# Patient Record
Sex: Female | Born: 1935 | Race: White | Hispanic: No | Smoking: Never smoker
Health system: Southern US, Community
[De-identification: ages and names within clinical notes are randomized; demographics above are authoritative.]

## PROBLEM LIST (undated history)

## (undated) DIAGNOSIS — Z8719 Personal history of other diseases of the digestive system: Secondary | ICD-10-CM

## (undated) DIAGNOSIS — Z5189 Encounter for other specified aftercare: Secondary | ICD-10-CM

## (undated) DIAGNOSIS — Z86010 Personal history of colonic polyps: Secondary | ICD-10-CM

## (undated) DIAGNOSIS — Z860101 Personal history of adenomatous and serrated colon polyps: Secondary | ICD-10-CM

## (undated) DIAGNOSIS — N3946 Mixed incontinence: Secondary | ICD-10-CM

## (undated) DIAGNOSIS — M1990 Unspecified osteoarthritis, unspecified site: Secondary | ICD-10-CM

## (undated) DIAGNOSIS — F32A Depression, unspecified: Secondary | ICD-10-CM

## (undated) DIAGNOSIS — M8580 Other specified disorders of bone density and structure, unspecified site: Secondary | ICD-10-CM

## (undated) DIAGNOSIS — K219 Gastro-esophageal reflux disease without esophagitis: Secondary | ICD-10-CM

## (undated) DIAGNOSIS — I350 Nonrheumatic aortic (valve) stenosis: Secondary | ICD-10-CM

## (undated) DIAGNOSIS — N3281 Overactive bladder: Secondary | ICD-10-CM

## (undated) DIAGNOSIS — Z9889 Other specified postprocedural states: Secondary | ICD-10-CM

## (undated) DIAGNOSIS — F329 Major depressive disorder, single episode, unspecified: Secondary | ICD-10-CM

## (undated) DIAGNOSIS — C801 Malignant (primary) neoplasm, unspecified: Secondary | ICD-10-CM

## (undated) DIAGNOSIS — I10 Essential (primary) hypertension: Secondary | ICD-10-CM

## (undated) HISTORY — DX: Essential (primary) hypertension: I10

## (undated) HISTORY — PX: CATARACT EXTRACTION, BILATERAL: SHX1313

## (undated) HISTORY — DX: Nonrheumatic aortic (valve) stenosis: I35.0

## (undated) HISTORY — DX: Unspecified osteoarthritis, unspecified site: M19.90

## (undated) HISTORY — PX: ROTATOR CUFF REPAIR: SHX139

## (undated) HISTORY — DX: Other specified disorders of bone density and structure, unspecified site: M85.80

## (undated) HISTORY — DX: Other specified postprocedural states: Z98.890

## (undated) HISTORY — DX: Malignant (primary) neoplasm, unspecified: C80.1

## (undated) HISTORY — DX: Gastro-esophageal reflux disease without esophagitis: K21.9

## (undated) HISTORY — DX: Encounter for other specified aftercare: Z51.89

## (undated) HISTORY — DX: Depression, unspecified: F32.A

## (undated) HISTORY — DX: Overactive bladder: N32.81

## (undated) HISTORY — DX: Mixed incontinence: N39.46

## (undated) HISTORY — DX: Personal history of colonic polyps: Z86.010

## (undated) HISTORY — PX: CHOLECYSTECTOMY: SHX55

## (undated) HISTORY — DX: Personal history of adenomatous and serrated colon polyps: Z86.0101

## (undated) HISTORY — DX: Major depressive disorder, single episode, unspecified: F32.9

## (undated) HISTORY — DX: Personal history of other diseases of the digestive system: Z87.19

---

## 1969-09-14 DIAGNOSIS — Z5189 Encounter for other specified aftercare: Secondary | ICD-10-CM

## 1969-09-14 HISTORY — PX: APPENDECTOMY: SHX54

## 1969-09-14 HISTORY — PX: ABDOMINAL HYSTERECTOMY: SHX81

## 1969-09-14 HISTORY — PX: OOPHORECTOMY: SHX86

## 1969-09-14 HISTORY — DX: Encounter for other specified aftercare: Z51.89

## 1993-09-14 HISTORY — PX: TOE SURGERY: SHX1073

## 1998-01-17 ENCOUNTER — Other Ambulatory Visit: Admission: RE | Admit: 1998-01-17 | Discharge: 1998-01-17 | Payer: Self-pay | Admitting: Obstetrics and Gynecology

## 1999-01-13 ENCOUNTER — Other Ambulatory Visit: Admission: RE | Admit: 1999-01-13 | Discharge: 1999-01-13 | Payer: Self-pay | Admitting: Obstetrics and Gynecology

## 2000-01-30 ENCOUNTER — Ambulatory Visit (HOSPITAL_COMMUNITY): Admission: RE | Admit: 2000-01-30 | Discharge: 2000-01-30 | Payer: Self-pay | Admitting: Obstetrics and Gynecology

## 2000-01-30 ENCOUNTER — Encounter: Payer: Self-pay | Admitting: Obstetrics and Gynecology

## 2000-03-25 ENCOUNTER — Encounter: Payer: Self-pay | Admitting: Obstetrics and Gynecology

## 2000-03-30 ENCOUNTER — Inpatient Hospital Stay (HOSPITAL_COMMUNITY): Admission: RE | Admit: 2000-03-30 | Discharge: 2000-03-31 | Payer: Self-pay | Admitting: Obstetrics and Gynecology

## 2001-03-22 ENCOUNTER — Other Ambulatory Visit: Admission: RE | Admit: 2001-03-22 | Discharge: 2001-03-22 | Payer: Self-pay | Admitting: Gastroenterology

## 2001-03-22 ENCOUNTER — Encounter (INDEPENDENT_AMBULATORY_CARE_PROVIDER_SITE_OTHER): Payer: Self-pay | Admitting: Specialist

## 2001-09-14 HISTORY — PX: BLADDER SURGERY: SHX569

## 2003-01-09 ENCOUNTER — Encounter: Admission: RE | Admit: 2003-01-09 | Discharge: 2003-01-09 | Payer: Self-pay | Admitting: Internal Medicine

## 2003-01-09 ENCOUNTER — Encounter: Payer: Self-pay | Admitting: Internal Medicine

## 2003-03-16 ENCOUNTER — Encounter (HOSPITAL_BASED_OUTPATIENT_CLINIC_OR_DEPARTMENT_OTHER): Payer: Self-pay | Admitting: General Surgery

## 2003-03-16 ENCOUNTER — Encounter: Admission: RE | Admit: 2003-03-16 | Discharge: 2003-03-16 | Payer: Self-pay | Admitting: General Surgery

## 2003-03-16 ENCOUNTER — Encounter (INDEPENDENT_AMBULATORY_CARE_PROVIDER_SITE_OTHER): Payer: Self-pay | Admitting: *Deleted

## 2003-09-25 ENCOUNTER — Encounter: Admission: RE | Admit: 2003-09-25 | Discharge: 2003-09-25 | Payer: Self-pay | Admitting: Internal Medicine

## 2004-07-23 ENCOUNTER — Ambulatory Visit: Payer: Self-pay | Admitting: Internal Medicine

## 2004-08-20 ENCOUNTER — Ambulatory Visit: Payer: Self-pay | Admitting: Internal Medicine

## 2004-09-14 DIAGNOSIS — Z9889 Other specified postprocedural states: Secondary | ICD-10-CM

## 2004-09-14 HISTORY — DX: Other specified postprocedural states: Z98.890

## 2005-01-26 ENCOUNTER — Ambulatory Visit: Payer: Self-pay | Admitting: Internal Medicine

## 2005-04-02 ENCOUNTER — Ambulatory Visit: Payer: Self-pay | Admitting: Cardiovascular Disease

## 2005-04-02 ENCOUNTER — Encounter: Payer: Self-pay | Admitting: Internal Medicine

## 2005-04-02 ENCOUNTER — Ambulatory Visit: Payer: Self-pay | Admitting: Family Medicine

## 2005-04-02 ENCOUNTER — Inpatient Hospital Stay (HOSPITAL_COMMUNITY): Admission: AD | Admit: 2005-04-02 | Discharge: 2005-04-04 | Payer: Self-pay | Admitting: Family Medicine

## 2005-04-03 ENCOUNTER — Encounter: Payer: Self-pay | Admitting: Cardiovascular Disease

## 2005-04-09 ENCOUNTER — Ambulatory Visit: Payer: Self-pay | Admitting: Internal Medicine

## 2005-04-10 ENCOUNTER — Ambulatory Visit (HOSPITAL_COMMUNITY): Admission: RE | Admit: 2005-04-10 | Discharge: 2005-04-10 | Payer: Self-pay | Admitting: Internal Medicine

## 2005-04-10 ENCOUNTER — Ambulatory Visit: Payer: Self-pay

## 2005-04-24 ENCOUNTER — Ambulatory Visit: Payer: Self-pay | Admitting: Cardiology

## 2005-06-25 ENCOUNTER — Ambulatory Visit: Payer: Self-pay | Admitting: Family Medicine

## 2005-09-18 ENCOUNTER — Ambulatory Visit: Payer: Self-pay | Admitting: Internal Medicine

## 2006-02-04 ENCOUNTER — Ambulatory Visit: Payer: Self-pay | Admitting: Internal Medicine

## 2006-05-28 ENCOUNTER — Ambulatory Visit: Payer: Self-pay | Admitting: Internal Medicine

## 2006-07-21 ENCOUNTER — Ambulatory Visit: Payer: Self-pay | Admitting: Internal Medicine

## 2007-03-10 ENCOUNTER — Encounter (INDEPENDENT_AMBULATORY_CARE_PROVIDER_SITE_OTHER): Payer: Self-pay | Admitting: *Deleted

## 2007-06-09 ENCOUNTER — Telehealth (INDEPENDENT_AMBULATORY_CARE_PROVIDER_SITE_OTHER): Payer: Self-pay | Admitting: *Deleted

## 2007-06-24 ENCOUNTER — Ambulatory Visit: Payer: Self-pay | Admitting: Internal Medicine

## 2007-06-24 DIAGNOSIS — M1990 Unspecified osteoarthritis, unspecified site: Secondary | ICD-10-CM | POA: Insufficient documentation

## 2007-06-24 DIAGNOSIS — D126 Benign neoplasm of colon, unspecified: Secondary | ICD-10-CM

## 2007-06-24 DIAGNOSIS — F329 Major depressive disorder, single episode, unspecified: Secondary | ICD-10-CM

## 2007-06-24 DIAGNOSIS — M8580 Other specified disorders of bone density and structure, unspecified site: Secondary | ICD-10-CM

## 2007-06-24 DIAGNOSIS — I10 Essential (primary) hypertension: Secondary | ICD-10-CM | POA: Insufficient documentation

## 2007-06-27 ENCOUNTER — Ambulatory Visit: Payer: Self-pay | Admitting: Internal Medicine

## 2007-06-28 LAB — CONVERTED CEMR LAB: Iron: 78 ug/dL (ref 42–145)

## 2007-07-11 ENCOUNTER — Encounter: Payer: Self-pay | Admitting: Internal Medicine

## 2007-07-11 ENCOUNTER — Ambulatory Visit: Payer: Self-pay

## 2007-07-15 ENCOUNTER — Ambulatory Visit: Payer: Self-pay | Admitting: Internal Medicine

## 2007-07-15 ENCOUNTER — Encounter: Payer: Self-pay | Admitting: Internal Medicine

## 2007-08-18 ENCOUNTER — Ambulatory Visit: Payer: Self-pay | Admitting: Internal Medicine

## 2007-08-19 ENCOUNTER — Encounter (INDEPENDENT_AMBULATORY_CARE_PROVIDER_SITE_OTHER): Payer: Self-pay | Admitting: *Deleted

## 2007-09-06 ENCOUNTER — Encounter (INDEPENDENT_AMBULATORY_CARE_PROVIDER_SITE_OTHER): Payer: Self-pay | Admitting: *Deleted

## 2007-09-06 ENCOUNTER — Telehealth (INDEPENDENT_AMBULATORY_CARE_PROVIDER_SITE_OTHER): Payer: Self-pay | Admitting: *Deleted

## 2007-09-06 ENCOUNTER — Encounter: Payer: Self-pay | Admitting: Internal Medicine

## 2007-10-04 ENCOUNTER — Telehealth: Payer: Self-pay | Admitting: Internal Medicine

## 2007-11-15 ENCOUNTER — Ambulatory Visit (HOSPITAL_COMMUNITY): Admission: RE | Admit: 2007-11-15 | Discharge: 2007-11-16 | Payer: Self-pay | Admitting: Obstetrics and Gynecology

## 2007-11-28 ENCOUNTER — Ambulatory Visit: Payer: Self-pay | Admitting: Internal Medicine

## 2007-11-28 DIAGNOSIS — K219 Gastro-esophageal reflux disease without esophagitis: Secondary | ICD-10-CM

## 2008-04-10 ENCOUNTER — Encounter: Payer: Self-pay | Admitting: Internal Medicine

## 2008-06-15 ENCOUNTER — Ambulatory Visit: Payer: Self-pay | Admitting: Internal Medicine

## 2008-06-19 LAB — CONVERTED CEMR LAB
BUN: 24 mg/dL — ABNORMAL HIGH (ref 6–23)
Basophils Relative: 0.6 % (ref 0.0–3.0)
Chloride: 104 meq/L (ref 96–112)
Creatinine, Ser: 0.8 mg/dL (ref 0.4–1.2)
Eosinophils Absolute: 0.2 10*3/uL (ref 0.0–0.7)
Eosinophils Relative: 3.7 % (ref 0.0–5.0)
GFR calc non Af Amer: 75 mL/min
Glucose, Bld: 90 mg/dL (ref 70–99)
HCT: 36.8 % (ref 36.0–46.0)
HDL: 50.8 mg/dL (ref >39.0)
MCV: 90.5 fL (ref 78.0–100.0)
Monocytes Relative: 10.5 % (ref 3.0–12.0)
Neutrophils Relative %: 55.8 % (ref 43.0–77.0)
Potassium: 3.7 meq/L (ref 3.5–5.1)
RBC: 4.07 M/uL (ref 3.87–5.11)
Total CHOL/HDL Ratio: 4.7
WBC: 5.1 10*3/uL (ref 4.5–10.5)

## 2008-10-02 ENCOUNTER — Telehealth (INDEPENDENT_AMBULATORY_CARE_PROVIDER_SITE_OTHER): Payer: Self-pay | Admitting: *Deleted

## 2008-10-04 ENCOUNTER — Telehealth (INDEPENDENT_AMBULATORY_CARE_PROVIDER_SITE_OTHER): Payer: Self-pay | Admitting: *Deleted

## 2008-11-21 ENCOUNTER — Ambulatory Visit: Payer: Self-pay | Admitting: Internal Medicine

## 2008-11-21 DIAGNOSIS — E785 Hyperlipidemia, unspecified: Secondary | ICD-10-CM

## 2008-11-22 ENCOUNTER — Telehealth: Payer: Self-pay | Admitting: Internal Medicine

## 2008-11-26 ENCOUNTER — Telehealth: Payer: Self-pay | Admitting: Internal Medicine

## 2008-11-26 ENCOUNTER — Ambulatory Visit: Payer: Self-pay | Admitting: Internal Medicine

## 2008-11-29 LAB — CONVERTED CEMR LAB
AST: 20 units/L (ref 0–37)
Cholesterol: 201 mg/dL (ref 0–200)
HDL: 46.9 mg/dL (ref >39.0)
TSH: 3.3 microintl units/mL (ref 0.35–5.50)
Total CHOL/HDL Ratio: 4.3
Triglycerides: 81 mg/dL (ref 0–149)
VLDL: 16 mg/dL (ref 0–40)

## 2009-01-07 ENCOUNTER — Ambulatory Visit: Payer: Self-pay | Admitting: Internal Medicine

## 2009-01-21 ENCOUNTER — Encounter: Payer: Self-pay | Admitting: Internal Medicine

## 2009-01-21 ENCOUNTER — Ambulatory Visit: Payer: Self-pay | Admitting: Internal Medicine

## 2009-01-24 ENCOUNTER — Encounter: Payer: Self-pay | Admitting: Internal Medicine

## 2009-03-19 ENCOUNTER — Encounter: Payer: Self-pay | Admitting: Internal Medicine

## 2009-03-20 ENCOUNTER — Telehealth (INDEPENDENT_AMBULATORY_CARE_PROVIDER_SITE_OTHER): Payer: Self-pay | Admitting: *Deleted

## 2009-06-12 ENCOUNTER — Ambulatory Visit: Payer: Self-pay | Admitting: Internal Medicine

## 2009-06-27 ENCOUNTER — Ambulatory Visit: Payer: Self-pay | Admitting: Internal Medicine

## 2009-12-13 ENCOUNTER — Ambulatory Visit: Payer: Self-pay | Admitting: Internal Medicine

## 2009-12-13 DIAGNOSIS — R079 Chest pain, unspecified: Secondary | ICD-10-CM

## 2009-12-20 ENCOUNTER — Ambulatory Visit: Payer: Self-pay | Admitting: Internal Medicine

## 2010-01-01 ENCOUNTER — Ambulatory Visit: Payer: Self-pay | Admitting: Cardiology

## 2010-01-01 DIAGNOSIS — I350 Nonrheumatic aortic (valve) stenosis: Secondary | ICD-10-CM

## 2010-01-20 ENCOUNTER — Ambulatory Visit: Payer: Self-pay

## 2010-01-20 ENCOUNTER — Encounter: Payer: Self-pay | Admitting: Cardiology

## 2010-01-20 ENCOUNTER — Ambulatory Visit (HOSPITAL_COMMUNITY): Admission: RE | Admit: 2010-01-20 | Discharge: 2010-01-20 | Payer: Self-pay | Admitting: Cardiology

## 2010-01-20 ENCOUNTER — Ambulatory Visit: Payer: Self-pay | Admitting: Internal Medicine

## 2010-02-03 ENCOUNTER — Ambulatory Visit: Payer: Self-pay | Admitting: Internal Medicine

## 2010-02-04 LAB — CONVERTED CEMR LAB
ALT: 23 units/L (ref 0–35)
AST: 21 units/L (ref 0–37)
LDL Cholesterol: 86 mg/dL (ref 0–99)
Total CHOL/HDL Ratio: 3

## 2010-02-19 ENCOUNTER — Encounter: Payer: Self-pay | Admitting: Internal Medicine

## 2010-04-15 ENCOUNTER — Ambulatory Visit: Payer: Self-pay | Admitting: Internal Medicine

## 2010-04-15 ENCOUNTER — Ambulatory Visit: Payer: Self-pay

## 2010-04-15 DIAGNOSIS — T6391XA Toxic effect of contact with unspecified venomous animal, accidental (unintentional), initial encounter: Secondary | ICD-10-CM | POA: Insufficient documentation

## 2010-04-15 DIAGNOSIS — R609 Edema, unspecified: Secondary | ICD-10-CM

## 2010-05-12 ENCOUNTER — Ambulatory Visit: Payer: Self-pay | Admitting: Family Medicine

## 2010-05-12 ENCOUNTER — Encounter: Payer: Self-pay | Admitting: Internal Medicine

## 2010-06-12 ENCOUNTER — Ambulatory Visit: Payer: Self-pay | Admitting: Internal Medicine

## 2010-08-14 ENCOUNTER — Ambulatory Visit: Payer: Self-pay | Admitting: Internal Medicine

## 2010-10-06 ENCOUNTER — Encounter: Payer: Self-pay | Admitting: Obstetrics and Gynecology

## 2010-10-12 LAB — CONVERTED CEMR LAB
AST: 20 units/L (ref 0–37)
Alkaline Phosphatase: 75 units/L (ref 39–117)
BUN: 17 mg/dL (ref 6–23)
BUN: 18 mg/dL (ref 6–23)
Basophils Absolute: 0 10*3/uL (ref 0.0–0.1)
Bilirubin, Direct: 0.1 mg/dL (ref 0.0–0.3)
CO2: 26 meq/L (ref 19–32)
Chloride: 106 meq/L (ref 96–112)
Cholesterol: 198 mg/dL (ref 0–200)
Cholesterol: 231 mg/dL — ABNORMAL HIGH (ref 0–200)
Creatinine, Ser: 0.8 mg/dL (ref 0.4–1.2)
Eosinophils Absolute: 0.2 10*3/uL (ref 0.0–0.7)
Eosinophils Relative: 3.5 % (ref 0.0–5.0)
GFR calc Af Amer: 107 mL/min
Glucose, Bld: 99 mg/dL (ref 70–99)
HCT: 37.7 % (ref 36.0–46.0)
HDL: 61 mg/dL (ref >39.0)
Hemoglobin: 11.5 g/dL — ABNORMAL LOW (ref 12.0–15.0)
LDL Cholesterol: 121 mg/dL — ABNORMAL HIGH (ref 0–99)
Lymphocytes Relative: 36.9 % (ref 12.0–46.0)
Lymphs Abs: 1.5 10*3/uL (ref 0.7–4.0)
MCHC: 33.8 g/dL (ref 30.0–36.0)
MCHC: 34.9 g/dL (ref 30.0–36.0)
MCV: 90.3 fL (ref 78.0–100.0)
MCV: 90.7 fL (ref 78.0–100.0)
Monocytes Absolute: 0.4 10*3/uL (ref 0.1–1.0)
Monocytes Absolute: 0.4 10*3/uL (ref 0.2–0.7)
Monocytes Relative: 11.9 % — ABNORMAL HIGH (ref 3.0–11.0)
Neutro Abs: 1.6 10*3/uL (ref 1.4–7.7)
Neutrophils Relative %: 45.5 % (ref 43.0–77.0)
Neutrophils Relative %: 54.4 % (ref 43.0–77.0)
Platelets: 250 10*3/uL (ref 150.0–400.0)
Potassium: 3.9 meq/L (ref 3.5–5.1)
Potassium: 3.9 meq/L (ref 3.5–5.1)
Sodium: 143 meq/L (ref 135–145)
TSH: 2.79 microintl units/mL (ref 0.35–5.50)
TSH: 3.04 microintl units/mL (ref 0.35–5.50)
Total CHOL/HDL Ratio: 5
Total Protein: 7 g/dL (ref 6.0–8.3)
Triglycerides: 202 mg/dL — ABNORMAL HIGH (ref 0.0–149.0)
WBC: 4.6 10*3/uL (ref 4.5–10.5)

## 2010-10-14 NOTE — Assessment & Plan Note (Signed)
Summary: [name]/ chestpain./ pt has secure horizone/ gd  Medications Added VITAMIN D 1000 UNIT  TABS (CHOLECALCIFEROL) Take 1 tablet by mouth once a day VITAMIN E 600 UNIT  CAPS (VITAMIN E) Take 1 capsule by mouth once a day ASPIRIN 81 MG TBEC (ASPIRIN) Take one tablet by mouth daily FISH OIL   OIL (FISH OIL) 2 by mouth daily VITAMIN B COMPLEX-C   CAPS (B COMPLEX-C) 1 by mouth daily * OCUVITE 1 by mouth daily CINNAMON 500 MG CAPS (CINNAMON) 1 by mouth daily      Allergies Added: NKDA  Visit Type:  Follow-up Primary Provider:  Nolon Rod. Paz MD  CC:  HCM.  History of Present Illness: The patient is referred for evaluation of chest pain. I have reviewed old records and see that she also has a history of mild septal hypertrophy with some evidence of dynamic outflow obstruction found on echo in 2008. She has had negative stress perfusion studies though I don't have these available. I did review the catheterization 2006 which demonstrated normal coronaries. She does report stress testing since that time. With a chest discomfort. She does have a history of left bundle branch block dating back at least 2006.  She reports chest discomfort which have been on the last day of March. She actually had some right arm discomfort upper right chest discomfort. It did not cross the sternum. Slight choking sensation and lasts several minutes. However, it went away spontaneously. She had one very mild episodes of this since then. She's not sure whether she's had this before. It came on at rest. There were no other associated symptoms. She is somewhat goes to the gym couple of times per week and she'll do any aerobic activities and a to the 150s. She has done this recently and has not brought on in his discomfort. She doesn't describe an he has no palpitations, presyncope or syncope    Current Medications (verified): 1)  Omeprazole 20 Mg  Cpdr (Omeprazole) .... Twice A Day, Always On Empty Stomach 2)  Procardia Xl  60 Mg  Tb24 (Nifedipine) .Marland Kitchen.. 1 By Mouth Once Daily 3)  Venlafaxine Hcl 25 Mg Tabs (Venlafaxine Hcl) .Marland Kitchen.. 1 By Mouth Two Times A Day 4)  Vitamin D 1000 Unit  Tabs (Cholecalciferol) .... Take 1 Tablet By Mouth Once A Day 5)  Vitamin E 600 Unit  Caps (Vitamin E) .... Take 1 Capsule By Mouth Once A Day 6)  Aspirin 81 Mg Tbec (Aspirin) .... Take One Tablet By Mouth Daily 7)  Simvastatin 20 Mg Tabs (Simvastatin) .Marland Kitchen.. 1 By Mouth At Bedtime - Due Lab Work in 6 Weeks 8)  Fish Gaffer (Engineer, petroleum) .... 2 By Mouth Daily 9)  Vitamin B Complex-C   Caps (B Complex-C) .Marland Kitchen.. 1 By Mouth Daily 10)  Ocuvite .Marland Kitchen.. 1 By Mouth Daily 11)  Cinnamon 500 Mg Caps (Cinnamon) .Marland Kitchen.. 1 By Mouth Daily  Allergies (verified): No Known Drug Allergies  Past History:  Past Medical History: HYPERTENSION   DEPRESSION  OSTEOPENIA   COLONIC POLYPS, ADENOMATOUS   OSTEOARTHRITIS   GERD Cardiac cath 2006, neg  Hyperlipidemia (mild)  Past Surgical History: Cholecystectomy Hysterectomy Oophorectomy Appendectomy (at time of hyst?) Bladder surgery Rotator cuff repair (R)  Family History: Reviewed history from 12/13/2009 and no changes required. colon ca--no breast ca--no MI--no CAD-- F had CABG at 75y/o GB cancer -- F stroke--M her identical twin dx ovarian Ca, patient saw her gyn, they did  a test  (?  CA 125) and it came back ok -- DECEASED 2009-08-25  Social History: Reviewed history from 12/13/2009 and no changes required. Married 2 kids tobacco--no ETOH-- rarely has wine  diet--  ok most of the time  exercise -- not as much as she likes   Review of Systems       Positive for reflux, joint pains. Otherwise as stated in the history of present illness negative for all other systems.  Physical Exam  General:  Well developed, well nourished, in no acute distress. Head:  normocephalic and atraumatic Eyes:  PERRLA/EOM intact; conjunctiva and lids normal. Mouth:  Teeth, gums and palate normal. Oral mucosa  normal. Neck:  Neck supple, no JVD. No masses, thyromegaly or abnormal cervical nodes. Chest Wall:  no deformities or breast masses noted Lungs:  Clear bilaterally to auscultation and percussion. Abdomen:  Bowel sounds positive; abdomen soft and non-tender without masses, organomegaly, or hernias noted. No hepatosplenomegaly. Msk:  Back normal, normal gait. Muscle strength and tone normal. Extremities:  No clubbing or cyanosis. Neurologic:  Alert and oriented x 3. Skin:  Intact without lesions or rashes. Cervical Nodes:  no significant adenopathy Axillary Nodes:  no significant adenopathy Inguinal Nodes:  no significant adenopathy Psych:  Normal affect.   Detailed Cardiovascular Exam  Neck    Carotids: Carotids full and equal bilaterally without bruits.      Neck Veins: Normal, no JVD.    Heart    Inspection: no deformities or lifts noted.      Palpation: normal PMI with no thrills palpable.      Auscultation: systolic murmur radiating slightly aortic outflow tract, this increases slightly with the strain phase of Valsalva  Vascular    Abdominal Aorta: no palpable masses, pulsations, or audible bruits.      Femoral Pulses: normal femoral pulses bilaterally.      Pedal Pulses: normal pedal pulses bilaterally.      Radial Pulses: normal radial pulses bilaterally.      Peripheral Circulation: no clubbing, cyanosis, or edema noted with normal capillary refill.     EKG  Procedure date:  12/13/2009  Findings:      Normal sinus rhythm, left bundle branch block, left axis deviation  Impression & Recommendations:  Problem # 1:  CHEST PAIN (ICD-786.50) The patient's chest pain was quite atypical. At this point I think the pretest probability of obstructive coronary disease is very low. I would not suggest further cardiovascular testing with imaging is as recurrent symptoms. We discussed this at length  Problem # 2:  HOCM (ICD-425.1) 3 years ago the patient had some hypertrophic  cardiomyopathy with septal. It was mild though there was some evidence of mitral valve involvement. She does have a dynamic murmur. I think it is reasonable to followup with an echocardiogram. Orders: Echocardiogram (Echo)  Problem # 3:  HYPERTENSION (ICD-401.9) Her blood pressure is slightly elevated. She reports that she has "white coat hypertension. I will make no change to her regimen  Patient Instructions: 1)  Your physician recommends that you schedule a follow-up appointment as directed 2)  Your physician recommends that you continue on your current medications as directed. Please refer to the Current Medication list given to you today. 3)  Your physician has requested that you have an echocardiogram.  Echocardiography is a painless test that uses sound waves to create images of your heart. It provides your doctor with information about the size and shape of your heart and how well your heart's chambers and  valves are working.  This procedure takes approximately one hour. There are no restrictions for this procedure.

## 2010-10-14 NOTE — Assessment & Plan Note (Signed)
Summary: yearly---/alr resch from 12/03/09   Vital Signs:  Patient profile:   75 year old female Height:      67 inches Weight:      205.2 pounds BMI:     32.26 Pulse rate:   82 / minute BP sitting:   160 / 80  Vitals Entered By: Shary Decamp (December 13, 2009 9:51 AM) CC: yearly - fasting Is Patient Diabetic? No Comments  - c/o of rt arm pain, radiated to chest yesterday when pt was folding clothes.  Took ASA - sxs resolved  - pt identical twin passed away 08/21/09 [facility]  December 13, 2009 9:52 AM    History of Present Illness:  yearly - fasting, chart reviewed   c/o of rt shoulder  pain, radiated to the upper R chest yesterday and felt "tight" in the throat sx happened when pt was folding clothes.  Took ASA - sxs resolved lasted x minutes  no assoc. nausea, diaphoresis, GERD symptoms (although recognizes she has heartburn lately) she was about to go to the ER but symptoms subsided    HYPERTENSION  -- no ambulatory BPs  DEPRESSION --lost her twin sister few months ago, they were very close, still quite depress  OSTEOPENIA  -- on OTC vit D OSTEOARTHRITIS  -- on tylenol, symptoms relatively well controlled     Allergies: No Known Drug Allergies  Past History:  Past Medical History: Reviewed history from 06/27/2009 and no changes required. HYPERTENSION   DEPRESSION  OSTEOPENIA   COLONIC POLYPS, ADENOMATOUS   OSTEOARTHRITIS   GERD heart murmur , remote ECHO trace MR, ECHO 10-08 was ok (see report) Cardiac cath 2006, neg  Hyperlipidemia (mild)  Past Surgical History: Reviewed history from 06/24/2007 and no changes required. Cholecystectomy Hysterectomy Oophorectomy appendectomy (at time of hyst?) bladder surgery Rotator cuff repair (R)  Family History: colon ca--no breast ca--no MI--no CAD-- F had CABG at 75y/o GB cancer -- F stroke--M her identical twin dx ovarian Ca, patient saw her gyn, they did  a test  (?CA 125) and it came back ok -- DECEASED  21-Aug-2009  Social History: Married 2 kids tobacco--no ETOH-- rarely has wine  diet--  ok most of the time  exercise -- not as much as she likes   Review of Systems General:  Denies fatigue and fever. CV:  Denies palpitations and swelling of feet. Resp:  Denies cough and shortness of breath. GI:  Denies bloody stools, diarrhea, nausea, and vomiting. GU:  sees gyn routinely . Psych:  Denies suicidal thoughts/plans.  Physical Exam  General:  alert and well-developed.   Neck:  no masses, no thyromegaly, and normal carotid upstroke.   Lungs:  normal respiratory effort, no intercostal retractions, no accessory muscle use, and normal breath sounds.   Heart:  normal rate, regular rhythm, no murmur, and no gallop.   Abdomen:  soft, non-tender, no distention, no masses, no guarding, and no rigidity.   Extremities:  no pretibial edema bilaterally  shoulders: ROM slightly  decreased but no pain elicited  Psych:  Oriented X3 and good eye contact.  Tearful when we talked about her sister    Impression & Recommendations:  Problem # 1:  CHEST PAIN (ICD-786.50) Assessment New single episode of   atypical chest pain, see HPI her father had a CABG in his 57s patient has hypertension and a sedentary lifestyle EKG today show LBBB, no change from previous will check BP before stress test to be sure is ok,  see instructions  plan:  chest x-ray Stress test ER if symptoms resurface continue aspirin  Orders: Venipuncture (08657) TLB-CBC Platelet - w/Differential (85025-CBCD) EKG w/ Interpretation (93000) EKG w/ Interpretation (93000) T-2 View CXR (84696EX) Cardiology Referral (Cardiology)  Problem # 2:  HEALTH SCREENING (ICD-V70.0) Assessment: New Td 2008 pneumonia shot 2005 and 2010 gyn care per Gynecology (female exam, [facility]) last colonoscopy 01/2009, next 2013  Problem # 3:  HYPERLIPIDEMIA (ICD-272.4) due for labs Labs Reviewed: SGOT: 20 (11/26/2008)   SGPT: 23 (11/26/2008)    HDL:46.9 (11/26/2008), 50.8 (06/15/2008)  LDL:DEL (11/26/2008), DEL (06/15/2008)  Chol:201 (11/26/2008), 239 (06/15/2008)  Trig:81 (11/26/2008), 104 (06/15/2008)  Orders: TLB-Lipid Panel (80061-LIPID) TLB-Hepatic/Liver Function Pnl (80076-HEPATIC)  Problem # 4:  GERD (ICD-530.81)  well-controlled? Advice to increase omeprazole to twice a day, always on empty stomach Her updated medication list for this problem includes:    Omeprazole 20 Mg Cpdr (Omeprazole) .Marland Kitchen... Twice a day, always on empty stomach  Orders: Prescription Created Electronically [phone])  Problem # 5:  OSTEOPENIA (ICD-733.90) due for a repeat bone density test  Orders: T-Vitamin D (25-Hydroxy) (32440-10272) Radiology Referral (Radiology)  Problem # 6:  DEPRESSION (ICD-311)  recently lost her sister-----> counseled referal to to a counselor? increased medication? no change , feels she will be ok  Her updated medication list for this problem includes:    Venlafaxine Hcl 25 Mg Tabs (Venlafaxine hcl) .Marland Kitchen... 1 by mouth two times a day  Problem # 7:  HYPERTENSION (ICD-401.9) well controlled? see instructions  Her updated medication list for this problem includes:    Procardia Xl 60 Mg Tb24 (Nifedipine) .Marland Kitchen... 1 by mouth once daily  BP today: 160/80 Prior BP: 122/80 (06/27/2009)  Labs Reviewed: K+: 3.7 (06/15/2008) Creat: : 0.8 (06/15/2008)   Chol: 201 (11/26/2008)   HDL: 46.9 (11/26/2008)   LDL: DEL (11/26/2008)   TG: 81 (11/26/2008)  Orders: TLB-BMP (Basic Metabolic Panel-BMET) (80048-METABOL) TLB-TSH (Thyroid Stimulating Hormone) (84443-TSH)  Problem # 8:  F2F > 55 min, > 50% in counseling   Complete Medication List: 1)  Omeprazole 20 Mg Cpdr (Omeprazole) .... Twice a day, always on empty stomach 2)  Procardia Xl 60 Mg Tb24 (Nifedipine) .Marland Kitchen.. 1 by mouth once daily 3)  Venlafaxine Hcl 25 Mg Tabs (Venlafaxine hcl) .Marland Kitchen.. 1 by mouth two times a day 4)  Calcium  5)  Vitamin D  6)  Vitamin E  7)  Vitamin B12    8)  Vitamin B6  9)  Baby Aspririn   Patient Instructions: 1)  check your BP daily, call if > 140/85 2)  see my nurse next week for a BP (just call ) 3)  Please schedule a follow-up appointment in 4 months .  Prescriptions: OMEPRAZOLE 20 MG  CPDR (OMEPRAZOLE) twice a day, always on empty stomach  #180 x 3   Entered and Authorized by:   Nolon Rod. Edsel Shives MD   Signed by:   Nolon Rod. Braydon Kullman MD on 12/13/2009   Method used:   Electronically to        [facility] # [phone]* (retail)       [address] [place], [place]  [address]       Ph: [phone]       Fax: [phone]   RxID:   [id number] VENLAFAXINE HCL 25 MG TABS (VENLAFAXINE HCL) 1 by mouth two times a day  #180 x 3  Entered by:   Shary Decamp   Authorized by:   Nolon Rod. Pura Picinich MD   Signed by:   Shary Decamp on 12/13/2009   Method used:   Electronically to        [facility] # [phone]* (retail)       [address] [place], [place]  [address]       Ph: [phone]       Fax: [phone]   RxID:   [id number] PROCARDIA XL 60 MG  TB24 (NIFEDIPINE) 1 by mouth once daily  #90.0 Each x 3   Entered by:   Shary Decamp   Authorized by:   Nolon Rod. Saleah Rishel MD   Signed by:   Shary Decamp on 12/13/2009   Method used:   Electronically to        [facility] # [phone]* (retail)       [address] [place], [place]  [address]       Ph: [phone]       Fax: [phone]   RxID:   [id number] OMEPRAZOLE 20 MG  CPDR (OMEPRAZOLE) 1 once daily  #90.0 Each x 3   Entered by:   Shary Decamp   Authorized by:   Nolon Rod. Marshal Eskew MD   Signed by:   Shary Decamp on 12/13/2009   Method used:   Electronically to        [facility] # [phone]* (retail)       [address] [place], [place]  [address]       Ph: [phone]       Fax: [phone]   RxID:   [id number]    Preventive Care Screening  Prior Values:    Pap  Smear:  normal (11/13/2007)    Mammogram:  normal (11/13/2007)    Colonoscopy:  1) Polyps, multiple (5 removed, maximum size 12 mm) ADENOMAS AND HYPERPLASTIC, ADENOMA WAS DIMiNUTIVE 2) Internal hemorrhoids 3) Otherwise normal examination, good prep 4) Prior adenomas (01/21/2009)    Bone Density:  osteopenia (07/15/2007)    Last Tetanus Booster:  Td (06/24/2007)    Last Flu Shot:  Fluvax 3+ (06/12/2009)    Last Pneumovax:  Pneumovax (Medicare) (11/21/2008)    Dexa Interp:  osteopenia (07/15/2007)

## 2010-10-14 NOTE — Assessment & Plan Note (Signed)
Summary: FLU SHOT///SPH  Flu Vaccine Consent Questions     Do you have a history of severe allergic reactions to this vaccine? no    Any prior history of allergic reactions to egg and/or gelatin? no    Do you have a sensitivity to the preservative Thimersol? no    Do you have a past history of Guillan-Barre Syndrome? no    Do you currently have an acute febrile illness? no    Have you ever had a severe reaction to latex? no    Vaccine information given and explained to patient? yes    Are you currently pregnant? no    Lot Number:[id number]   Exp Date:03/14/2011   Site Given  Left Deltoid IM   Nurse Visit   Allergies: No Known Drug Allergies  Orders Added: 1)  Flu Vaccine 16yrs + MEDICARE PATIENTS [Q2039] 2)  Administration Flu vaccine - MCR [G0008]

## 2010-10-14 NOTE — Miscellaneous (Signed)
Summary: BONE DENSITY  Clinical Lists Changes  Orders: Added new Test order of T-Bone Densitometry (77080) - Signed Added new Test order of T-Lumbar Vertebral Assessment (77082) - Signed 

## 2010-10-14 NOTE — Assessment & Plan Note (Signed)
Summary: 4 month followup/kn   Vital Signs:  Patient profile:   75 year old female Weight:      208 pounds Pulse rate:   96 / minute Pulse rhythm:   regular BP sitting:   126 / 84  (left arm) Cuff size:   large  Vitals Entered By: Army Fossa CMA (August 14, 2010 9:48 AM) CC: 4 month f/u- fasting    History of Present Illness: ROV feels well  OSTEOPENIA  --results of the bone density test were discussed, she reports good compliance with calcium and vitamin D over the counter, not exercising as much as she would like  Current Medications (verified): 1)  Omeprazole 20 Mg  Cpdr (Omeprazole) .... Once A Day, Always On Empty Stomach 2)  Procardia Xl 60 Mg  Tb24 (Nifedipine) .Marland Kitchen.. 1 By Mouth Once Daily 3)  Venlafaxine Hcl 25 Mg Tabs (Venlafaxine Hcl) .Marland Kitchen.. 1 By Mouth Two Times A Day 4)  Vitamin D 1000 Unit  Tabs (Cholecalciferol) .... Take 1 Tablet By Mouth Once A Day 5)  Vitamin E 600 Unit  Caps (Vitamin E) .... Take 1 Capsule By Mouth Once A Day 6)  Aspirin 81 Mg Tbec (Aspirin) .... Take One Tablet By Mouth Daily 7)  Simvastatin 20 Mg Tabs (Simvastatin) .Marland Kitchen.. 1 By Mouth At Bedtime - Due Lab Work in 6 Weeks 8)  Fish Gaffer (Engineer, petroleum) .... 2 By Mouth Daily 9)  Vitamin B Complex-C   Caps (B Complex-C) .Marland Kitchen.. 1 By Mouth Daily 10)  Ocuvite .Marland Kitchen.. 1 By Mouth Daily 11)  Cinnamon 500 Mg Caps (Cinnamon) .Marland Kitchen.. 1 By Mouth Daily 12)  Oxybutynin Chloride 5 Mg Xr24h-Tab (Oxybutynin Chloride) .Marland Kitchen.. 1 By Mouth Daily  Allergies (verified): No Known Drug Allergies  Past History:  Past Medical History: HYPERTENSION   DEPRESSION  OSTEOPENIA   COLONIC POLYPS, ADENOMATOUS   OSTEOARTHRITIS   GERD Cardiac cath 2006, neg  Hyperlipidemia (mild) h/o HOCM, saw cards,  ECHO 5-11 Mild AS but no evidence of HCM or MR ----> no further testing suggested   Past Surgical History: Reviewed history from 01/01/2010 and no changes required. Cholecystectomy Hysterectomy Oophorectomy Appendectomy (at time  of hyst?) Bladder surgery Rotator cuff repair (R)  Social History: Reviewed history from 12/13/2009 and no changes required. Married 2 kids tobacco--no ETOH-- rarely has wine  diet--  ok most of the time  exercise -- not as much as she likes   Review of Systems       HYPERTENSION  -- no recent ambulatory BPs  DEPRESSION -- symptoms well controlled on 1 tab a day   OSTEOARTHRITIS  -- symptoms well controled w/ aleve, no GI s/e  Hyperlipidemia -- good medication compliance     Impression & Recommendations:  Problem # 1:  HYPERLIPIDEMIA (ICD-272.4) well-controlled   Her updated medication list for this problem includes:    Simvastatin 20 Mg Tabs (Simvastatin) .Marland Kitchen... 1 by mouth at bedtime - due lab work in 6 weeks  Labs Reviewed: SGOT: 21 (02/03/2010)   SGPT: 23 (02/03/2010)   HDL:54.40 (02/03/2010), 45.50 (12/13/2009)  LDL:86 (02/03/2010), DEL (16/06/9603)  Chol:162 (02/03/2010), 231 (12/13/2009)  Trig:107.0 (02/03/2010), 202.0 (12/13/2009)  Problem # 2:  DEPRESSION (ICD-311) symptoms well controlled at present takes only one tab a day (med list not changed) Her updated medication list for this problem includes:    Venlafaxine Hcl 25 Mg Tabs (Venlafaxine hcl) .Marland Kitchen... 1 by mouth two times a day  Problem # 3:  HYPERTENSION (  ICD-401.9) at goal  Her updated medication list for this problem includes:    Procardia Xl 60 Mg Tb24 (Nifedipine) .Marland Kitchen... 1 by mouth once daily  BP today: 126/84 Prior BP: 138/82 (04/15/2010)  Labs Reviewed: K+: 3.9 (12/13/2009) Creat: : 0.8 (12/13/2009)   Chol: 162 (02/03/2010)   HDL: 54.40 (02/03/2010)   LDL: 86 (02/03/2010)   TG: 107.0 (02/03/2010)  Problem # 4:  OSTEOPENIA (ICD-733.90) we discussed her last bone density test, osteopenia slightly worse. no family history of osteopenia we discussed the option of medication, we eventually agree on: Work on exercise more Causing a vitamin D re-check another bone density test 07-2012 The following  medications were removed from the medication list:    Ergocalciferol 50000 Unit Caps (Ergocalciferol) ..... One by mouth weekly for 3 months Her updated medication list for this problem includes:    Vitamin D 1000 Unit Tabs (Cholecalciferol) .Marland Kitchen... Take 1 tablet by mouth once a day  Complete Medication List: 1)  Omeprazole 20 Mg Cpdr (Omeprazole) .... Once a day, always on empty stomach 2)  Procardia Xl 60 Mg Tb24 (Nifedipine) .Marland Kitchen.. 1 by mouth once daily 3)  Venlafaxine Hcl 25 Mg Tabs (Venlafaxine hcl) .Marland Kitchen.. 1 by mouth two times a day 4)  Vitamin D 1000 Unit Tabs (Cholecalciferol) .... Take 1 tablet by mouth once a day 5)  Vitamin E 600 Unit Caps (Vitamin e) .... Take 1 capsule by mouth once a day 6)  Aspirin 81 Mg Tbec (Aspirin) .... Take one tablet by mouth daily 7)  Simvastatin 20 Mg Tabs (Simvastatin) .Marland Kitchen.. 1 by mouth at bedtime - due lab work in 6 weeks 8)  Fish Oil Oil (Fish oil) .... 2 by mouth daily 9)  Vitamin B Complex-c Caps (B complex-c) .Marland Kitchen.. 1 by mouth daily 10)  Ocuvite  .Marland KitchenMarland Kitchen. 1 by mouth daily 11)  Cinnamon 500 Mg Caps (Cinnamon) .Marland Kitchen.. 1 by mouth daily 12)  Oxybutynin Chloride 5 Mg Xr24h-tab (Oxybutynin chloride) .Marland Kitchen.. 1 by mouth daily  Patient Instructions: 1)  Please schedule a follow-up appointment by April 2012. fasting, physical exam    Orders Added: 1)  Est. Patient Level III [16109]

## 2010-10-14 NOTE — Assessment & Plan Note (Signed)
Summary: FOR A BP CHECK//PH   Nurse Visit   Vital Signs:  Patient profile:   75 year old female Height:      67 inches Weight:      205.2 pounds Pulse rate:   88 / minute BP sitting:   140 / 80  Vitals Entered By: Shary Decamp (December 20, 2009 1:51 PM) CC: bp check, bp @ home 130's/80's   Impression & Recommendations:  Problem # 1:  HYPERTENSION (ICD-401.9) no change  Her updated medication list for this problem includes:    Procardia Xl 60 Mg Tb24 (Nifedipine) .Marland Kitchen... 1 by mouth once daily  Orders: No Charge Patient Arrived (NCPA0) (NCPA0)  BP today: 140/80 Prior BP: 160/80 (12/13/2009)  Labs Reviewed: K+: 3.9 (12/13/2009) Creat: : 0.8 (12/13/2009)   Chol: 231 (12/13/2009)   HDL: 45.50 (12/13/2009)   LDL: DEL (11/26/2008)   TG: 202.0 (12/13/2009)  Complete Medication List: 1)  Omeprazole 20 Mg Cpdr (Omeprazole) .... Twice a day, always on empty stomach 2)  Procardia Xl 60 Mg Tb24 (Nifedipine) .Marland Kitchen.. 1 by mouth once daily 3)  Venlafaxine Hcl 25 Mg Tabs (Venlafaxine hcl) .Marland Kitchen.. 1 by mouth two times a day 4)  Calcium  5)  Vitamin D  6)  Vitamin E  7)  Vitamin B12  8)  Vitamin B6  9)  Baby Aspririn  10)  Simvastatin 20 Mg Tabs (Simvastatin) .Marland Kitchen.. 1 by mouth at bedtime - due lab work in 6 weeks   Allergies: No Known Drug Allergies  Orders Added: 1)  No Charge Patient Arrived (NCPA0) [NCPA0]

## 2010-10-14 NOTE — Miscellaneous (Signed)
Summary: Orders Update  Clinical Lists Changes  Orders: Added new Test order of Venous Duplex Lower Extremity (Venous Duplex Lower) - Signed  Appended Document: Orders Update reportedly, ultrasound negative

## 2010-10-14 NOTE — Letter (Signed)
Summary: Fax Regarding Clinical Research Trial for Shingles Vaccine  Fax Regarding Clinical Research Trial for Shingles Vaccine   Imported By: Lanelle Bal 02/27/2010 09:49:37  _____________________________________________________________________  External Attachment:    Type:   Image     Comment:   External Document

## 2010-10-14 NOTE — Assessment & Plan Note (Signed)
Summary: 4 month roa//lch   Vital Signs:  Patient profile:   75 year old female Weight:      207.25 pounds Pulse rate:   82 / minute Pulse rhythm:   regular BP sitting:   138 / 82  (left arm) Cuff size:   large  Vitals Entered By: Army Fossa CMA (April 15, 2010 8:24 AM) CC: Pt here for f/u visit: Fasting. Comments - Was stung by bee's on thursday last week states her arms are still swollen and itchey.   History of Present Illness: -- Was stung by bee's on thursday last week , states her arm is  still swollen and itchy taking Benadryl over-the-counter --one month history of swelling in the right leg, from the knee down, on and off, mostly when she is sitting for long periods of time. No pain otherwise. No problems with the left leg.      Current Medications (verified): 1)  Omeprazole 20 Mg  Cpdr (Omeprazole) .... Once A Day, Always On Empty Stomach 2)  Procardia Xl 60 Mg  Tb24 (Nifedipine) .Marland Kitchen.. 1 By Mouth Once Daily 3)  Venlafaxine Hcl 25 Mg Tabs (Venlafaxine Hcl) .Marland Kitchen.. 1 By Mouth Two Times A Day 4)  Vitamin D 1000 Unit  Tabs (Cholecalciferol) .... Take 1 Tablet By Mouth Once A Day 5)  Vitamin E 600 Unit  Caps (Vitamin E) .... Take 1 Capsule By Mouth Once A Day 6)  Aspirin 81 Mg Tbec (Aspirin) .... Take One Tablet By Mouth Daily 7)  Simvastatin 20 Mg Tabs (Simvastatin) .Marland Kitchen.. 1 By Mouth At Bedtime - Due Lab Work in 6 Weeks 8)  Fish Gaffer (Engineer, petroleum) .... 2 By Mouth Daily 9)  Vitamin B Complex-C   Caps (B Complex-C) .Marland Kitchen.. 1 By Mouth Daily 10)  Ocuvite .Marland Kitchen.. 1 By Mouth Daily 11)  Cinnamon 500 Mg Caps (Cinnamon) .Marland Kitchen.. 1 By Mouth Daily 12)  Oxybutynin Chloride 5 Mg Xr24h-Tab (Oxybutynin Chloride) .Marland Kitchen.. 1 By Mouth Daily  Allergies (verified): No Known Drug Allergies  Past History:  Past Medical History: HYPERTENSION   DEPRESSION  OSTEOPENIA   COLONIC POLYPS, ADENOMATOUS   OSTEOARTHRITIS   GERD Cardiac cath 2006, neg  Hyperlipidemia (mild)  Past Surgical  History: Reviewed history from 01/01/2010 and no changes required. Cholecystectomy Hysterectomy Oophorectomy Appendectomy (at time of hyst?) Bladder surgery Rotator cuff repair (R)  Review of Systems       tolerating statins well, denies myalgias Denies cough, chest pain, wheezing.  Physical Exam  General:  alert and well-developed.   Extremities:  calves  symmetric right calf is slightly tender to palpation Extremities are otherwise normal with good capillary refill and color She has mild superficial varicose veins bilaterally  Skin:  3 areas in the left arm are slightly swollen, slightly warm and tender they measured from 1-2 inches in diameter. No fluctuancy or discharge   Impression & Recommendations:  Problem # 1:  BEE STING (ICD-989.5) Will do a short course of prednisone Continue with Benadryl over-the-counter Call if not better in a few days  Problem # 2:  LEG EDEMA (ICD-782.3) symptoms going on for a month,exam is negative except for some tenderness at the right calf For completeness, we'll check a lower extremity ultrasound  Orders: Cardiology Referral (Cardiology)  Problem # 3:  HYPERLIPIDEMIA (ICD-272.4) responding well to treatment without side effects Her updated medication list for this problem includes:    Simvastatin 20 Mg Tabs (Simvastatin) .Marland Kitchen... 1 by mouth at bedtime -  due lab work in 6 weeks  Labs Reviewed: SGOT: 21 (02/03/2010)   SGPT: 23 (02/03/2010)   HDL:54.40 (02/03/2010), 45.50 (12/13/2009)  LDL:86 (02/03/2010), DEL (21/30/8657)  Chol:162 (02/03/2010), 231 (12/13/2009)  Trig:107.0 (02/03/2010), 202.0 (12/13/2009)  Problem # 4:  OSTEOPENIA (ICD-733.90)  needs vitamin D replacement Prescribed ergocalciferol Bone density test Her updated medication list for this problem includes:    Vitamin D 1000 Unit Tabs (Cholecalciferol) .Marland Kitchen... Take 1 tablet by mouth once a day    Ergocalciferol 50000 Unit Caps (Ergocalciferol) ..... One by mouth weekly  for 3 months  Orders: Radiology Referral (Radiology)  Problem # 5:  HYPERTENSION (ICD-401.9) reports good ambulatory BPs  Her updated medication list for this problem includes:    Procardia Xl 60 Mg Tb24 (Nifedipine) .Marland Kitchen... 1 by mouth once daily  BP today: 138/82 Prior BP: 140/80 (12/20/2009)  Labs Reviewed: K+: 3.9 (12/13/2009) Creat: : 0.8 (12/13/2009)   Chol: 162 (02/03/2010)   HDL: 54.40 (02/03/2010)   LDL: 86 (02/03/2010)   TG: 107.0 (02/03/2010)  Complete Medication List: 1)  Omeprazole 20 Mg Cpdr (Omeprazole) .... Once a day, always on empty stomach 2)  Procardia Xl 60 Mg Tb24 (Nifedipine) .Marland Kitchen.. 1 by mouth once daily 3)  Venlafaxine Hcl 25 Mg Tabs (Venlafaxine hcl) .Marland Kitchen.. 1 by mouth two times a day 4)  Vitamin D 1000 Unit Tabs (Cholecalciferol) .... Take 1 tablet by mouth once a day 5)  Vitamin E 600 Unit Caps (Vitamin e) .... Take 1 capsule by mouth once a day 6)  Aspirin 81 Mg Tbec (Aspirin) .... Take one tablet by mouth daily 7)  Simvastatin 20 Mg Tabs (Simvastatin) .Marland Kitchen.. 1 by mouth at bedtime - due lab work in 6 weeks 8)  Fish Oil Oil (Fish oil) .... 2 by mouth daily 9)  Vitamin B Complex-c Caps (B complex-c) .Marland Kitchen.. 1 by mouth daily 10)  Ocuvite  .Marland KitchenMarland Kitchen. 1 by mouth daily 11)  Cinnamon 500 Mg Caps (Cinnamon) .Marland Kitchen.. 1 by mouth daily 12)  Oxybutynin Chloride 5 Mg Xr24h-tab (Oxybutynin chloride) .Marland Kitchen.. 1 by mouth daily 13)  Ergocalciferol 50000 Unit Caps (Ergocalciferol) .... One by mouth weekly for 3 months 14)  Prednisone 10 Mg Tabs (Prednisone) .... 4 by mouth once daily x 2, 3x2,2x2,1x2  Patient Instructions: 1)  take the prescribed Vit D weekly x 3 months, then reassume your daily vit D 2)  Please schedule a follow-up appointment in 4 months .  Prescriptions: PREDNISONE 10 MG TABS (PREDNISONE) 4 by mouth once daily x 2, 3x2,2x2,1x2  #20 x 0   Entered and Authorized by:   Nolon Rod. Paz MD   Signed by:   Nolon Rod. Paz MD on 04/15/2010   Method used:   Electronically to         [facility] # [id number]* (retail)       [address] [place], [place]  [address]       Ph: [phone]       Fax: [phone]   RxID:   [id number] ERGOCALCIFEROL 50000 UNIT CAPS (ERGOCALCIFEROL) one by mouth weekly for 3 months  #12 x 0   Entered and Authorized by:   Nolon Rod. Paz MD   Signed by:   Nolon Rod. Paz MD on 04/15/2010   Method used:   Electronically to        [facility] # [phone]* (retail)  [address] [place], [place]  [address]       Ph: [phone]       Fax: [phone]   RxID:   [id number]

## 2010-10-25 ENCOUNTER — Encounter: Payer: Self-pay | Admitting: Internal Medicine

## 2010-11-03 ENCOUNTER — Telehealth: Payer: Self-pay | Admitting: Internal Medicine

## 2010-11-11 NOTE — Progress Notes (Signed)
Summary: Alternate rx  Phone Note Refill Request Call back at Home Phone [phone] Message from:  Patient on November 03, 2010 3:00 PM  Refills Requested: Medication #1:  VENLAFAXINE HCL 25 MG TABS 1 by mouth two times a day Patient notes that she went to the pharmacy for refill and the above med has gone from $5 to $44. Patient would like cheaper alternative. Please advis.e  Initial call taken by: Lucious Groves CMA,  November 03, 2010 3:00 PM  Follow-up for Phone Call        ok 180, 4 RF Follow-up by: [facility] E. Quierra Silverio MD,  November 04, 2010 1:39 PM  Additional Follow-up for Phone Call Additional follow up Details #1::        Pt would like a different med, cannot afford.  Additional Follow-up by: Army Fossa CMA,  November 04, 2010 1:43 PM    Additional Follow-up for Phone Call Additional follow up Details #2::    in the past we planned to d/c this med: rec to tke 1 a day x 1 month, then d/c . If depression resurface we can get another med (generic); so she will have to buy velaxefine this month bu is not going to be long term expensive (call #30) Terence Googe E. Clance Baquero MD  November 04, 2010 5:04 PM   Additional Follow-up for Phone Call Additional follow up Details #3:: Details for Additional Follow-up Action Taken: I spoke w/ pt she is aware- will try the above instructions. Army Fossa CMA  November 05, 2010 8:42 AM   New/Updated Medications: VENLAFAXINE HCL 25 MG TABS (VENLAFAXINE HCL) 1 by mouth once daily. Prescriptions: VENLAFAXINE HCL 25 MG TABS (VENLAFAXINE HCL) 1 by mouth once daily.  #30 x 0   Entered by:   Army Fossa CMA   Authorized by:   Nolon Rod. Gwyn Hieronymus MD   Signed by:   Army Fossa CMA on 11/05/2010   Method used:   Electronically to        [facility] # [id number]* (retail)       [address] [place], [place]  [address]       Ph: [phone]       Fax: [phone]   RxID:   [id number]

## 2010-12-17 ENCOUNTER — Encounter: Payer: Self-pay | Admitting: Internal Medicine

## 2010-12-17 ENCOUNTER — Ambulatory Visit (INDEPENDENT_AMBULATORY_CARE_PROVIDER_SITE_OTHER): Payer: Medicare Other | Admitting: Internal Medicine

## 2010-12-17 DIAGNOSIS — I421 Obstructive hypertrophic cardiomyopathy: Secondary | ICD-10-CM

## 2010-12-17 DIAGNOSIS — E785 Hyperlipidemia, unspecified: Secondary | ICD-10-CM

## 2010-12-17 DIAGNOSIS — Z0000 Encounter for general adult medical examination without abnormal findings: Secondary | ICD-10-CM | POA: Insufficient documentation

## 2010-12-17 DIAGNOSIS — F329 Major depressive disorder, single episode, unspecified: Secondary | ICD-10-CM

## 2010-12-17 DIAGNOSIS — F3289 Other specified depressive episodes: Secondary | ICD-10-CM

## 2010-12-17 DIAGNOSIS — M899 Disorder of bone, unspecified: Secondary | ICD-10-CM

## 2010-12-17 DIAGNOSIS — M949 Disorder of cartilage, unspecified: Secondary | ICD-10-CM

## 2010-12-17 DIAGNOSIS — I10 Essential (primary) hypertension: Secondary | ICD-10-CM

## 2010-12-17 LAB — BASIC METABOLIC PANEL
BUN: 23 mg/dL (ref 6–23)
Calcium: 9.8 mg/dL (ref 8.4–10.5)
GFR: 88.69 mL/min (ref >60.00)
Glucose, Bld: 88 mg/dL (ref 70–99)
Potassium: 4 mEq/L (ref 3.5–5.1)
Sodium: 143 mEq/L (ref 135–145)

## 2010-12-17 LAB — LIPID PANEL
Cholesterol: 161 mg/dL (ref 0–200)
HDL: 47.9 mg/dL (ref >39.00)
LDL Cholesterol: 90 mg/dL (ref 0–99)
Triglycerides: 117 mg/dL (ref 0.0–149.0)
VLDL: 23.4 mg/dL (ref 0.0–40.0)

## 2010-12-17 LAB — CBC WITH DIFFERENTIAL/PLATELET
Eosinophils Absolute: 0.2 10*3/uL (ref 0.0–0.7)
Eosinophils Relative: 4.7 % (ref 0.0–5.0)
HCT: 37.8 % (ref 36.0–46.0)
Lymphs Abs: 1.5 10*3/uL (ref 0.7–4.0)
MCHC: 34.4 g/dL (ref 30.0–36.0)
MCV: 90.3 fl (ref 78.0–100.0)
Monocytes Absolute: 0.5 10*3/uL (ref 0.1–1.0)
Neutrophils Relative %: 55.8 % (ref 43.0–77.0)
Platelets: 235 10*3/uL (ref 150.0–400.0)

## 2010-12-17 LAB — AST: AST: 17 U/L (ref 0–37)

## 2010-12-17 MED ORDER — VENLAFAXINE HCL ER 37.5 MG PO CP24: 37.5000 mg | ORAL_CAPSULE | Freq: Every day | ORAL | Status: DC

## 2010-12-17 NOTE — Patient Instructions (Signed)
Restart Effexor, if you need a stronger dose, let me know  Exercise 30 minutes daily Healthy diet

## 2010-12-17 NOTE — Progress Notes (Signed)
Subjective:    Patient ID: [name], female    DOB: [birth year], 75 y.o.   MRN: [id number]  HPI CPX, chart reviewed  H/o depression, was doing well, we wean off effexor. Now notices that is very anxious when her husband drives, would like to go back on meds HTN --  good medication compliance, BP always < 140 Chol med-- good compliance   Past Medical History  Diagnosis Date  . Hypertension   . Depression   . Osteopenia   . Hx of adenomatous colonic polyps   . Osteoarthritis   . History of gastroesophageal reflux (GERD)   . Hx of cardiac cath 2006    neg  . HOCM (hypertrophic obstructive cardiomyopathy)     ECHO 5-20ll mild AS but no evidence of HCM or MR--no further testing suggested    Past Surgical History  Procedure Date  . Cholecystectomy   . Abdominal hysterectomy   . Oophorectomy   . Appendectomy     (at time of hyst?)  . Bladder surgery   . Rotator cuff repair     right   . Toe surgery     R 2nd --- correction of claw toe    History   Social History  . Marital Status: Married    Spouse Name: N/A    Number of Children: 2  . Years of Education: N/A   Occupational History  . stays at home     Social History Main Topics  . Smoking status: Never Smoker   . Smokeless tobacco: Not on file  . Alcohol Use: 0.0 oz/week     wine   . Drug Use: No  . Sexually Active: Not on file   Other Topics Concern  . Not on file   Social History Narrative   Goes to the gym sometimes, active in the yard. Son is getting divorce    Family History  Problem Relation Age of Onset  . Coronary artery disease Father 24    had CABG  . Cancer Father     gallbladder  . Stroke Mother 58  . Ovarian cancer Sister 104  . Colon cancer      NEGATIVE  . Breast cancer      NEGATIVE     Review of Systems  Constitutional: Negative for fever, fatigue and unexpected weight change.  Cardiovascular: Negative for chest pain, palpitations and leg swelling.  Gastrointestinal:  Negative for nausea, abdominal pain, diarrhea and blood in stool.  Genitourinary: Negative for dysuria and hematuria.  Musculoskeletal:       Hurt her 2 R toe a month ago, was swollen, now better. Still hurt some  Psychiatric/Behavioral:       No depression       Objective:   Physical Exam  Constitutional: She appears well-developed and well-nourished. No distress.  HENT:  Head: Normocephalic and atraumatic.  Neck: Neck supple. No thyromegaly present.       Normal carotid pulse  Cardiovascular: Normal rate, regular rhythm and normal heart sounds.        Has a systolic murmur most noticeable on the left  Pulmonary/Chest: Effort normal and breath sounds normal. No respiratory distress. She has no wheezes. She has no rales.  Abdominal: Soft. Bowel sounds are normal. She exhibits no distension. There is no tenderness. There is no rebound.  Musculoskeletal: She exhibits no edema.       Right second toe slightly swollen, not tender, is not deformed compared to previous months per  patient  Psychiatric: She has a normal mood and affect. Her behavior is normal. Judgment and thought content normal.          Assessment & Plan:  Today , I spent more than 35   min with the patient, >50% of the time counseling, and /or reviewing the chart and labs ordered by other providers  Likely had a toe injury, see above, rec observation, will call if not better in 2 weeks for x-ray

## 2010-12-17 NOTE — Assessment & Plan Note (Signed)
Well-controlled, no change 

## 2010-12-17 NOTE — Assessment & Plan Note (Addendum)
Td 2008 pneumonia shot 2005 and 2010 Shingles immunization done @ [facility] per Gynecology (female exam, [facility]) last colonoscopy 01/2009, next 2013 Diet and exercise discussed

## 2010-12-17 NOTE — Assessment & Plan Note (Signed)
Currently on vitamin D, calcium. Labs

## 2010-12-17 NOTE — Assessment & Plan Note (Signed)
H/o depression, was doing well so  we wean off effexor. Now notices that is very anxious when her husband drives to the point that is causing problems; she was not doing that before , would like to go back on meds. I think that is quite reasonable.

## 2010-12-17 NOTE — Assessment & Plan Note (Signed)
Good medication compliance, labs 

## 2010-12-18 LAB — VITAMIN D 25 HYDROXY (VIT D DEFICIENCY, FRACTURES): Vit D, 25-Hydroxy: 80 ng/mL (ref 30–89)

## 2011-01-21 ENCOUNTER — Other Ambulatory Visit: Payer: Self-pay | Admitting: *Deleted

## 2011-01-21 MED ORDER — SIMVASTATIN 20 MG PO TABS: 20.0000 mg | ORAL_TABLET | Freq: Every day | ORAL | Status: DC

## 2011-01-27 NOTE — H&P (Signed)
[name], [name]                 ACCOUNT NO.:  [id number]   MEDICAL RECORD NO.:  [id number]          PATIENT TYPE:  AMB   LOCATION:  [facility]                           FACILITY:  [facility]   PHYSICIAN:  Randye Lobo, M.D.   DATE OF BIRTH:  01/05/1936   DATE OF ADMISSION:  11/15/2007  DATE OF DISCHARGE:                              HISTORY & PHYSICAL   DATE OF THE PREOPERATIVE HISTORY AND PHYSICAL EXAMINATION:  October 27, 2007.   CHIEF COMPLAINT:  Urinary incontinence.   HISTORY OF PRESENT ILLNESS:  The patient is a 75 year old para 2  Caucasian female, status post total abdominal hysterectomy with  bilateral salpingo-oophorectomy for endometriosis 35 years prior, who is  status post pubovaginal sling with a cadaveric fascia by Dr. Etta Grandchild and  status post anterior and posterior colporrhaphy by Dr. Jackelyn Knife in  2001, who presents with continued urinary incontinence.  The patient  reports that she received no real relief from her pubovaginal sling  approximately 10 years ago.  The patient leaks urine with coughing,  sneezing, laughing and getting out of a chair.  She also experiences  urinary frequency.  She requires the constant use of a pad.  The patient  has received some benefit from [facility] for her detrusor instability.  The patient wishes for surgical treatment of her incontinence, and she  has therefore undergone multichannel urodynamic testing in the office,  for cystometrics confirmed the presence of mixed incontinence.  The  patient did have evidence of genuine stress incontinence with a leak  point pressure in of 79 cmH2O.  She did demonstrate detrusor  contractions on the cystometric study.  On the pressure flow study, the  patient had a maximum detrusor pressure of 40 cmH2O.  She had no  evidence of any significant postvoid residual.   PAST OBSTETRIC AND GYNECOLOGIC HISTORY:  Remarkable for a total  abdominal hysterectomy with bilateral salpingo-oophorectomy 35 years  prior by Dr. Arletha Grippe endometriosis.  The patient is currently on  Estrace 1 mg p.o. daily.  The patient's last Pap smear was performed in  November 2008 and was within normal limits.  Her last mammogram and a  right breast ultrasound were performed on September 27, 2007, at which  time the patient was diagnosed with benign changes in the right breast.  The patient is status post pubovaginal cadaveric fascial sling with  anterior and posterior colporrhaphy and 2001.   PAST MEDICAL HISTORY:  1. Hypertension.  2. Osteoarthritis.  3. Osteopenia.  4. Depression.  5. Status post total abdominal hysterectomy with bilateral salpingo-      oophorectomy.  6. Status post cholecystectomy.  7. Status post appendectomy.  8. Status post right rotator cuff repair.  9. Status post pubovaginal sling with anterior and posterior      colporrhaphy in 2001.  10.History of biliary colic with elevated amylase and lipase levels      with no evidence of clinical pancreatitis.  The patient is cared      for by Dr. Lina Sar.  11.New diagnosis of heart murmur.  The patient is  status post a      cardiac echo in October 2008, which was normal.   MEDICATIONS:  1. Estrace 1 mg p.o. daily.  2. Omeprazole 20 mg p.o. daily  3. Procardia XL 60 mg p.o. daily.  4. Aspirin one p.o. daily.  5. Effexor XR 37.5 mg one p.o. daily.  6. [place] XR 60 mg one p.o. daily.   ALLERGIES:  No known drug allergies.   SOCIAL HISTORY:  The patient is married.  She denies the use of tobacco.  She uses alcohol seldom.  She denies the use of illicit drugs.   FAMILY HISTORY:  Positive for stroke in the patient's mother.  Positive  for hypertension in the patient's mother.  Negative for cancer of the  breast, ovary, uterus, or colon.   REVIEW OF SYSTEMS:  The patient denies a history of constipation or  fecal incontinence.   PHYSICAL EXAM:  Blood pressure 131/88, height 5 feet 7 inches, weight  196 pounds.  GENERAL:  The  patient is a woman of the stated age in no acute distress.  HEENT: Normocephalic, atraumatic.  LUNGS:  Clear to auscultation bilaterally.  HEART:  S1, S2, with a regular rate and rhythm.  ABDOMEN:  Soft, nontender, and without evidence of hepatosplenomegaly or  organomegaly.  PELVIC:  Normal external genitalia and urethra.  The Q-tip test  demonstrates 25 degrees of deflection with a poor coughing effort.  There is no evidence of a cystocele, rectocele or vaginal vault  prolapse.  The cervix is absent.  There is no evidence of any midline  nor adnexal masses nor tenderness.   IMPRESSION:  The patient is a 75 year old para 2 female, status post  total abdominal hysterectomy with bilateral salpingo-oophorectomy and  status post pubovaginal sling with cadaveric fascia and anterior and  posterior colporrhaphy, who has mixed incontinence and urodynamically-  proven evidence of genuine stress incontinence.   PLAN:  The patient will undergo a tension-free vaginal tape midurethral  sling and cystoscopy at the [facility] [place] on November 15, 2007.  Risks, benefits, and alternatives have been discussed with the  patient, who wishes to proceed.      Randye Lobo, M.D.  Electronically Signed     BES/MEDQ  D:  11/14/2007  T:  11/14/2007  Job:  [id number]

## 2011-01-27 NOTE — Op Note (Signed)
[name], [name]                 ACCOUNT NO.:  [id number]   MEDICAL RECORD NO.:  [id number]          PATIENT TYPE:  AMB   LOCATION:  SDC                           FACILITY:  [facility]   PHYSICIAN:  Randye Lobo, M.D.   DATE OF BIRTH:  [birth year]   DATE OF PROCEDURE:  11/15/2007  DATE OF DISCHARGE:                               OPERATIVE REPORT   PREOPERATIVE DIAGNOSES:  1. Genuine stress incontinence.  2. Status post pubovaginal cadaveric fascial sling.   POSTOPERATIVE DIAGNOSES:  1. Genuine stress incontinence.  2. Status post pubovaginal cadaveric fascial sling.   PROCEDURE:  A tension-free vaginal tape mid urethral sling and  cystoscopy.   SURGEON:  Conley Simmonds, MD   ASSISTANT:  Lodema Hong, MD   ANESTHESIA:  General endotracheal.   IV FLUIDS:  2200 mL Ringer's lactate.   URINE OUTPUT:  200 mL.   ESTIMATED BLOOD LOSS:  200 mL.   COMPLICATIONS:  None.   INDICATIONS FOR PROCEDURE:  The patient is a 75 year old, para 2  Caucasian female status post total abdominal hysterectomy with bilateral  salpingo-oophorectomy for endometriosis 35 years prior, who was status  post pubovaginal sling with cadaveric fascia with Dr. Etta Grandchild and status  post anterior and posterior colporrhaphy with Dr. Jackelyn Knife, both in  2001 at the same surgical procedure, who presented with continued  urinary incontinence.  The patient reported that she received no relief  of her urinary incontinence following the pubovaginal sling.  She  reported urinary leakage with coughing, sneezing, laughing, getting out  of a chair and also urgency episodes.  The patient had been treated with  anti cholinergic therapy with some relief of her incontinence.  On  physical examination, the patient was noted to have a fairly well  supported urethra with a Q-tip test demonstrating 25 degrees of  deflexion with an actual poor coughing and Valsalva maneuver effort.  There was no real evidence of a cystocele, vaginal  vault prolapse, or  rectocele.  The cervix was surgically absent.   Multichannel urodynamic testing in the office confirmed mixed  incontinence with genuine stress incontinence and a loop of leak point  pressure of 79 cm of water.  The patient also demonstrated detrusor  contractions at maximum bladder capacity.  On a pressure flow study, the  patient had a maximum detrusor pressure 40 cmH2O.  There was no evidence  of any significant both postvoid residual.   The patient wishes for the treatment of her persistent and genuine  stress incontinence and a plan is now made to proceed with a tension-  free vaginal tape mid urethral sling and cystoscopy after risks,  benefits, and alternatives are reviewed.  The patient does understand  that she may not achieve 100% continence following this procedure and  may need to proceed with further treatments which may include  periurethral bulking agents and continued anticholinergic therapy.   FINDINGS:  Examination under anesthesia revealed a well supported the  urethra and bladder.  There was fairly significant scarring located  along the urethra consistent with the patient's prior sling procedure.  The vaginal vault and posterior vaginal walls were well supported.  The  cervix was absent.  The vaginal tissue appeared to be atrophic.   Cystoscopy demonstrated the bladder to have trabeculations.  There was  no evidence of any bladder lesions.  The bladder was visualized  throughout 360 degrees including the bladder dome and trigone.  The  ureters were patent bilaterally after the injection of indigo carmine  dye IV.  There was no evidence of foreign body in either the bladder or  the urethra during placement of the sling.   SPECIMENS:  None.   PROCEDURE:  The patient was reidentified in the preoperative hold area.  She did receive ciprofloxacin 400 mg IV for antibiotic prophylaxis.  She  received both TED hose and PAS stockings for DVT  prophylaxis.   In the operating room, general endotracheal anesthesia was induced and  the patient was then placed in the dorsal lithotomy position.  The lower  abdomen, vagina and perineum were then sterilely prepped and draped.  A  Foley catheter was placed inside the bladder.   An examination was performed and the findings are as noted above.  A  weighted speculum was placed inside the vagina and a Foley catheter was  placed in the bladder and left to gravity drainage throughout the  procedure and at the termination of the procedure.   Allis clamps were used to mark the anterior vaginal wall beginning  approximately 1 cm below the urethral meatus and extending over a  distance of approximately 3 cm.  The vaginal mucosa was injected with  half percent lidocaine with 1:200,000 of epinephrine.  The vaginal  mucosa was then incised vertically in the midline with a scalpel.  Sharp  dissection was used to dissect the vaginal mucosa off of the overlying  subvaginal tissue and bladder.  This was performed bilaterally.  There  was scar tissue which was encountered consistent with the patient's  prior pubovaginal sling.  The majority of the dissection was carried out  closer to the area of the mid urethra and not the urethrovesical  junction.  Hemostasis during the dissection was created with monopolar  cautery.   The suprapubic incisions were marked 2 cm to the right and left of the  midline and were located along the most apical portions of the patient's  prior suprapubic incision.  The sling was performed in a top-down  fashion.  The abdominal needle passer was placed through the right  suprapubic incision through the space of Retzius and then out through  the Endo pelvic fascia at the level of the mid urethra and lateral to it  on the ipsilateral side.  The same procedure that was performed on the  right-hand side was repeated on the left-hand side.  During passage of  these  instruments, scar tissue could be felt by palpation with the  abdominal needle passers.   The Foley catheter was removed at this time and cystoscopy was performed  and the findings are as noted above.  There was no evidence of a foreign  body in the bladder.  The urethra and the ureters were noted to be  patent bilaterally.  The cystoscopy fluid was drained and the Foley  catheter was replaced and the sling was then attached to the abdominal  needle passers and was drawn up through the suprapubic incisions  bilaterally.  The sling was noted to be in the mid urethral position.  A  Kelly clamp was placed between  the sling and the urethra as the plastic  sheaths were removed.  Excess sling was trimmed suprapubically.   There was some bleeding noted from the exit site of the abdominal needle  passer on the right-hand side and two figure-of-eight sutures of 2-0  Vicryl were placed in this region in order to control bleeding.   Excess sling material was trimmed at this time.  There was some tearing  of the vaginal mucosa which had occurred from the atrophic tissue and  two extensions of the vaginal mucosal incision were repaired separately  with interrupted sutures of 2-0 Vicryl.  The midline vaginal incision  was then repaired with interrupted sutures of 2-0 Vicryl.  Hemostasis  was noted to be good at this time.  A vaginal packing with Estrace cream  was placed in the vagina.   The suprapubic incisions were closed with Dermabond.   The patient was cleansed of any remaining Betadine, awakened, and taken  out of the dorsal lithotomy position.  She was escorted to the recovery  room in stable and awake condition.  There were no complications to the  procedure.  All needle, instrument, and sponge counts were correct.      Randye Lobo, M.D.  Electronically Signed     BES/MEDQ  D:  11/15/2007  T:  11/15/2007  Job:  [id number]   cc:   Willow Ora, MD  [phone] [address] [place], [place]  [address]

## 2011-01-30 NOTE — Discharge Summary (Signed)
[facility]  Patient:    [name], [name]                        MRN: [id number] Adm. Date:  [id number] Disc. Date: [id number] Attending:  Michaele Offer CC:         Claudette Laws, M.D.                           Discharge Summary  ADMISSION DIAGNOSES: 1. Cystocele. 2. Perineal defect. 3. Stress urinary incontinence.  DISCHARGE DIAGNOSES: 1. Cystocele. 2. Perineal defect. 3. Stress urinary incontinence.  PROCEDURES:  Anterior colporrhaphy, perineorrhaphy and pubovaginal sling by Dr. Derrell Lolling. Sural.  COMPLICATIONS:  None.  CONSULTATIONS:  None.  HISTORY:  Briefly, this is a 75 year old black female, gravida 3, para 2-0-1-2, on Estrace for estrogen-replacement therapy, with the complaints of urine leakage consistent with stress incontinence.  On physical exam, she has a grade 2 cystocele, a grade 2 perineal defect and Dr. Javier Glazier has evaluated her and determined that she has stress urinary incontinence and she is admitted for surgical therapy for these problems.  PAST HISTORY:  Significant for two vaginal deliveries and one incomplete abortion, treated with D&C.  She has a hiatal hernia and a heart murmur associated with a left bundle branch block and intermittent atrial arrhythmia. She has had a tonsillectomy, total abdominal hysterectomy with bilateral salpingo-oophorectomy and rotator cuff repair.  MEDICATIONS:  Estrace and Detrol and a histamine blocker.  PHYSICAL EXAMINATION:  Physical exam significant for again a grade 2 cystocele, grade 2 perineal defect, no rectocele and good vaginal vault support and no pelvic masses.  HOSPITAL COURSE:  Patient was admitted on the day of surgery and underwent the above-mentioned procedures without complications.  This was done under general anesthesia with an estimated blood loss of 300 cc and she tolerated the procedure well.  Preoperative hemoglobin was 12.4; postoperative was  11.6. She was rapidly able to ambulate and tolerated a regular diet and remained afebrile.  On the afternoon of postoperative day #1, she was able to void with her suprapubic catheter in place and at this time, was stable enough for discharge home.  CONDITION ON DISCHARGE:  Stable.  DISPOSITION:  Discharged to home.  DIET:  Her diet is a regular diet.  ACTIVITY:  Her activity is pelvic rest, no strenuous activity and no driving, FOLLOWUP:  Her followup is in approximately six days with Dr. Etta Grandchild and in six weeks with myself.  DISCHARGE MEDICATIONS:  Percocet p.r.n. pain and Cipro 250 mg b.i.d. for seven days. DD:  03/31/00 TD:  04/02/00 Job: [id number]

## 2011-01-30 NOTE — Consult Note (Signed)
NAMECHAVELA, JUSTINIANO                 ACCOUNT NO.:  [id number]   MEDICAL RECORD NO.:  [id number]          PATIENT TYPE:  INP   LOCATION:  4738                         FACILITY:  [facility]   PHYSICIAN:  Pricilla Riffle, M.D.    DATE OF BIRTH:  26-Jul-1936   DATE OF CONSULTATION:  04/02/2005  DATE OF DISCHARGE:                                   CONSULTATION   REFERRING PHYSICIAN:  Dr. Lelon Perla.   IDENTIFICATION:  [name] is a 75 year old woman who is followed by Dr.  Willa Rough.  She has a history over the past several weeks of bilateral  shoulder and chest tightness with exertion.  Yesterday, she was vacuuming  and developed some discomfort that eased off with rest.  Episodes last  usually less than 30 minutes and resolve on their own.  On the treadmill  also she had episodes that recurred and again relieved with rest, advised to  go initially to the ER, refused, and was seen by primary MD and admitted for  evaluation.   ALLERGIES:  None.   MEDICATIONS:  1.  Ditropan.  2.  Prilosec.  3.  Diovan 80 mg.  4.  Celexa 40 mg.  5.  Estradiol 1.5 mg.  6.  Calcium.  7.  Multivitamin.  8.  Vitamin E.  9.  B12.  10. Aspirin 81 mg.   PAST MEDICAL HISTORY:  1.  History of a heart murmur with aortic sclerosis.  2.  History of chronic left bundle branch block, last adenosine Cardiolite      in 2004 with no ischemia.  3.  Hypertension.  4.  History of GE reflux.   PAST SURGICAL HISTORY:  1.  Tonsillectomy.  2.  TAH/BSO.  3.  Rotator cuff repair.  4.  Cholecystectomy.  5.  Foot surgery.   SOCIAL HISTORY:  The patient lives in [place], is married, does not  smoke, does not drink.   FAMILY HISTORY:  Mother died of a CVA at age 35; father died of CHF, had  CABG in his 18s; 3 sisters alive and well.   REVIEW OF SYSTEMS:  Decreased 15 pounds x1 year.  Headache.  Wears glasses.  Shortness of breath.  Dyspnea with exertion.  Nocturia.  Depression.  Arthralgias.  Arthritis.  GE  reflux.  Otherwise, all systems reviewed and  negative except as noted.   PHYSICAL EXAMINATION:  GENERAL:  On exam, the patient is currently pain-  free, in no acute distress.  VITAL SIGNS:  Blood pressure is 111/73, pulse is 80 and regular, temperature  is 97.  LUNGS:  Clear.  NECK:  JVP is normal.  CARDIAC:  Regular rate and rhythm.  S1 and S2.  No S3.  Grade 3/6 systolic  murmur heard best at the base, consistent with aortic sclerosis.  CHEST:  Nontender.  ABDOMEN:  Exam is benign.  EXTREMITIES:  Good pulses distally.  No edema.   LABORATORY AND ACCESSORY CLINICAL DATA:  Labs are pending.   Twelve-lead EKG shows normal sinus rhythm at a rate of 68 beats per minute.  Left bundle branch block.   Chest x-ray pending.   IMPRESSION:  [name] is a 75 year old woman with a history of classic  exertional angina.  EKG is not interpretable.  I have discussed with the  patient that I would recommend cardiac catheterization to define her  anatomy.  Risks were explained; the patient understands and agrees to  proceed.  We will go ahead and place on schedule for tomorrow.       PVR/MEDQ  D:  04/02/2005  T:  04/03/2005  Job:  [id number]

## 2011-01-30 NOTE — Op Note (Signed)
[facility]  Patient:    [name], [name]                        MRN: [id number] Proc. Date: 03/30/00 Adm. Date:  [id number] Attending:  Michaele Offer                           Operative Report  PREOPERATIVE DIAGNOSIS:  Stress urinary incontinence.  POSTOPERATIVE DIAGNOSIS:  Stress urinary incontinence.  OPERATION PERFORMED:  Pubovaginal sling procedure and cystoscopy.  SURGEON:  Claudette Laws, M.D.  ASSISTANT:  Zenaida Niece, M.D.  DESCRIPTION OF PROCEDURE:  Dr. Jackelyn Knife initiated the repair of her cystocele and once it had been dissected out, I entered the operative field. A Foley catheter was placed along with a vaginal speculum. We broke through the urethropelvic ligament retrosymphysis with sharp and blunt dissection care being taken to avoid inadvertently entering the bladder. There were some rather dense adhesions which we broke up with our finger. We then went suprapubically and made a small incision just above the pubic bone and dissected down to the rectus muscle and then using a Raz needle, a pass was made suprapubically behind the pubic bone on the tips of the fingers and directed the needle out the vaginal area. A donor freeze dried fascia lata graft was used. A 3 cm wide x 15 cm long piece of tissue that had been reinforced with a #1 Prolene suture was on either end. The ends of the suture were threaded through the needle of the Raz needle and were brought suprapubically. A similar pass was made on the opposite side. The graft was then situated right over the bladder neck area.  Cystoscopy was then performed. There was no inadvertent puncture of the bladder. The orifices appeared normal. Indigo carmine was then injected intravenously.  We then filled the bladder, placed the patient in Trendelenburg position and using the microvasive catheter put in a suprapubic tube under direct vision that was curled up in the dome of  the bladder. The catheter was tied to the skin with 3-0 nylon and then placed in a locked position.  Then Dr. Jackelyn Knife finished his part of the cystocele perineorraphy repair. Having filled the bladder up, we could efflux some water with suprapubic pressure. I then tied down the sling in a loose fashion and we put in a cystoscope, deflected it about 30 degrees while I tied down the sling. We tied each end of the Prolene to each other and then across the midline over a finger. Again care being taken to keep the sling loose. The suprapubic wound was irrigated with copious amounts of bug juice and then closed with skin staples. A Foley catheter was placed and Dr. Jackelyn Knife will dictate his part of the procedure. DD:  03/30/00 TD:  03/30/00 Job: [id number]

## 2011-01-30 NOTE — H&P (Signed)
[name], [name]                ACCOUNT NO.:  [id number]   MEDICAL RECORD NO.:  [id number]          PATIENT TYPE:  INP   LOCATION:                               FACILITY:  [facility]   PHYSICIAN:  Lelon Perla, M.D.  DATE OF BIRTH:  13-Jun-1936   DATE OF ADMISSION:  04/02/2005  DATE OF DISCHARGE:                                HISTORY & PHYSICAL   ADMISSION DIAGNOSIS:  Chest pain, shortness of breath, rule out myocardial  infarction.   HISTORY OF PRESENT ILLNESS:  The patient is a 75 year old white female who  presented to the office today with a 2-day history of chest pain with  exertion that occurred last night while she was at [facility] [place] on the treadmill.  She complained of chest tightness,  shortness of breath, and neck pain.  She warned the fitness instructors who  then got her off the treadmill and had her rest.  The patient states it took  about 30 minutes for the pain to go away.  They had wanted her to go to the  emergency room, but the patient refused.  At that time, the patient and her  husband promised to bring her in to the doctor's office today to be  evaluated.  The patient also states that earlier yesterday, before the gym,  she had been vacuuming and the same thing happened.  The patient describes  the pain in the back of the neck, upper back, and chest that shoots all the  way across her back.  The patient also admits to some shortness of breath  during each one of these episodes and today complains that she has a little  bit of stiff neck that has been going on for months now.  Otherwise feels  fine with no chest pain or shortness of breath.   PAST MEDICAL HISTORY:  Significant for hypertension, hiatal hernia with  reflux, osteoarthritis.   MEDICATIONS:  1.  Ditropan XL one 10 mg tablet and one 5 mg tablet a day.  2.  Estradiol 1-1/2 tablets a day of 1 mg.  3.  Prilosec 20 mg a day.  4.  Procardia XL 60 mg.  5.  Diovan 80 mg.  6.   Celexa 40 mg a day.  7.  Calcium with D and multivitamin, vitamin E, B12, and B6.  8.  Aspirin 81 mg a day.   ALLERGIES:  No known drug allergies.   PAST SURGICAL HISTORY:  Significant for a total hysterectomy with BSO  secondary to endometriosis and fibroid tumors 30 to 40 years ago.  Rotator  cuff surgery in 2001.  Bladder prolapse repair.  The patient has had a  Cardiolite stress test most recently in 2004.  On September 10, 2003, a  Cardiolite stress test was done which was negative.  This was done at  [facility].  She also had an echocardiogram which according to the chart had  trace MR and TR in 1999, a copy of that evaluation was not available for me.   FAMILY HISTORY:  Significant only for hypertension.  SOCIAL HISTORY:  Denies any smoking or alcohol.  Her husband is present with  her in the examination room.   PHYSICAL EXAMINATION:  VITAL SIGNS:  Blood pressure 120/86, weight 209-1/2,  temperature 98, pulse 64, respirations 18.  GENERAL:  The patient is awake, alert and oriented and in no acute distress.  HEENT:  Head is normocephalic and atraumatic.  Eyes; pupils equal, round,  and reactive to light.  Tympanic membranes are intact bilaterally.  Oropharynx is clear.  NECK:  Supple.  No JVD and no bruit.  HEART:  Positive S1 and S2 with a positive systolic murmur, no radiation.  LUNGS:  Clear bilaterally, no rales, rhonchi, or wheezing.  ABDOMEN:  Soft and nontender.  EXTREMITIES:  No edema.  She had good strength in all four extremities.  Cranial nerves II-XII grossly intact.   EKG shows a left bundle branch block which was not new when compared to old  EKG done in 2004.  Looked like there was some worsening laterally, but  difficult to interpret secondary to the left bundle branch block.   ASSESSMENT:  1.  Chest pain, with shortness of breath, rule out myocardial infarction.      The patient is asymptomatic now, but will admit to telemetry.  I did      discuss this with  Salvadore Farber, M.D. [facility].  He felt it was better to      put her in the hospital as well to rule her out.  Cardiology is      consulted and will do CPK x3 with troponin.  Two-dimensional      echocardiogram was ordered and serial EKG's and labs.  2.  Hypertension.  Will continue her current medications and I did add      Toprol 25 mg one a day.       YRL/MEDQ  D:  04/02/2005  T:  04/02/2005  Job:  [id number]

## 2011-01-30 NOTE — Cardiovascular Report (Signed)
NAMEDUHA, ABAIR NO.:  [id number]   MEDICAL RECORD NO.:  [id number]          PATIENT TYPE:  INP   LOCATION:  4738                         FACILITY:  [facility]   PHYSICIAN:  Arvilla Meres, M.D. LHCDATE OF BIRTH:  09/01/1936   DATE OF PROCEDURE:  04/03/2005  DATE OF DISCHARGE:  04/04/2005                              CARDIAC CATHETERIZATION   PATIENT IDENTIFICATION:  [name] is a delightful 75 year-old woman with a  history of hypertension and a left bundle branch block who was admitted for  exertional chest pain.  She is ruled out for myocardial infarction and is  referred for diagnostic cardiac catheterization.   PROCEDURES PERFORMED:  1.  Selective coronary angiography.  2.  Left heart catheterization.  3.  Left ventriculogram.   CARDIOLOGIST:  Arvilla Meres, M.D.   DESCRIPTION OF PROCEDURE:  The risks and benefits of the catheterization  were explained to [name].  Consent was signed and placed on the chart. A 6-  [place] arterial sheath was placed in the right femoral artery using a  modified Seldinger technique. Standard catheters including a JL-4, JR-4 and  bent pigtail were used for the procedure.  All catheter exchanges were made  over wire.  At the end of the procedure, the patient was transferred to the  holding area in stable condition.  There are no apparent complications with  the catheterization.  However, the patient did have infiltration of her IV  on arrival to the cardiac catheterization lab with a fairly swollen and  painful left forearm.   FINDINGS:  Central aortic pressure was 131/66 with a mean of 94.  LV  pressure was 119/0 with an EDP of 2.   Left main:  Short, angiographically normal.   The LAD was a long vessel wrapping the apex.  It gave off a small first  diagonal and a very large branching second diagonal.  There was a long  irregularity in the mid LAD right after the D1 followed by 30% focal  stenosis at the takeoff  of the second diagonal. Otherwise the vessel was  free of significant atherosclerosis.   Left circumflex:  Was a large dominant vessel.  It gave off a large  branching OM-I.  The  distal AV groove circumflex was large and gave off a  PL and a moderate size PDA.  There is no angiographic CAD.   The right coronary artery was a moderate size nondominant to codominant  vessel.  It gave off a small PDA which came off of the acute marginal  territory.  The distal RCA was diminutive.   The left ventriculogram was done in the RAO approach and showed a normal  ejection fraction estimated about 60%.  There was no wall motion  abnormalities. There is no evidence of significant mitral regurgitation.   ASSESSMENT:  1.  Minimal nonobstructive coronary disease in the left anterior descending,      otherwise angiographically normal coronary arteries.  2.  Normal left ventricular ejection fraction.   PLAN:  Plan will be for therapy with continued control of her  risk factors.  Consider workup of other causes of her chest pain and dyspnea.  She should  be served overnight for bedrest for her arteriotomy site as well as  continued observation of her IV infiltration of her left forearm for which  she will need hot compresses.       DB/MEDQ  D:  04/03/2005  T:  04/04/2005  Job:  [id number]   cc:   Willa Rough, M.D.   Ronalee Red  [address]  [place]  [place] [address]  Fax: [phone]

## 2011-01-30 NOTE — Discharge Summary (Signed)
[name], [name]                 ACCOUNT NO.:  [id number]   MEDICAL RECORD NO.:  [id number]          PATIENT TYPE:  INP   LOCATION:  4738                         FACILITY:  [facility]   PHYSICIAN:  Valetta Mole. George, M.D. Romualdo Bolk OF BIRTH:  05/24/1936   DATE OF ADMISSION:  04/02/2005  DATE OF DISCHARGE:  04/04/2005                                 DISCHARGE SUMMARY   DISCHARGE DIAGNOSES:  1.  Chest pain, unknown etiology.  2.  Minimal nonobstructive coronary artery disease.  3.  Hypertension.  4.  Gastroesophageal reflux disease with known hiatal hernia.  5.  History of osteoarthritis.  6.  Urinary incontinence, status post pubovaginal sling.  7.  Status post cystocele repair with anterior colporrhaphy and      perineorrhaphy.   DISCHARGE MEDICATIONS:  1.  Celexa 40 mg p.o. daily.  2.  Ditropan XL 10 mg p.o. daily.  3.  Diovan 80 mg p.o. daily.  4.  Procardia XL 60 mg p.o. daily.  5.  Estradiol 1 mg, 1-1/2 tablets p.o. daily.  6.  Aspirin 81 mg p.o. daily.  7.  Prilosec 20 mg p.o. daily.   HOSPITAL LABORATORIES:  Discharge CBC with a hemoglobin of 11.9, otherwise  normal.  BMET on April 04, 2005, was normal with a creatinine of 0.7.  Urinalysis April 03, 2005, was normal.  Lipid profile April 03, 2005, with  cholesterol 189, HDL 50, LDL 113.  Cardiac panels x3 were all normal.  EKG  demonstrates normal sinus rhythm with a left bundle branch block, also first  degree AV block.   HOSPITAL PROCEDURES:  Coronary angiogram with left heart catheterization  April 03, 2005, by Dr. Gala Romney demonstrated a normal ejection fraction with  minimal nonobstructive coronary disease.   HOSPITAL COURSE:  The patient was admitted to the hospitalist service on  April 02, 2005, by Dr. Laury Axon.  See her admission note for details.  The  patient also had some dyspnea on exertion with chest discomfort.  It was  decided to perform left heart catheterization.  Results as listed above and  full note dictated by  Dr. Gala Romney. The patient otherwise remained stable  in the hospital.  The patient was eager for discharge on the day of  discharge, and she will follow up with Dr. Drue Novel for evaluation of noncardiac  causes of chest discomfort.      [name], M.D. [facility]  Electronically Signed    BHS/MEDQ  D:  04/04/2005  T:  04/04/2005  Job:  [id number]

## 2011-01-30 NOTE — H&P (Signed)
[facility]  Patient:    [name], [name]                        MRN: [id number] Adm. Date:  [id number] Attending:  Michaele Offer                         History and Physical  CHIEF COMPLAINT:  Cystocele and stress urinary incontinence.  HISTORY OF PRESENT ILLNESS:  This is a 75 year old white female, gravida 3, para 2-0-1-2, whom I saw for her annual exam in May of this year.  She has been on Estrace for estrogen-replacement therapy and complained of a superficial knot on her right flank with some occasional pain that radiated down her back.  On review of systems at that time, she did complain of leaking urine when she stands and she had no GI symptoms.  On physical exam, she was found to have a grade 2 cystocele, a grade 2 perineal defect with no rectocele and good vaginal vault support.  Due to her urinary symptoms, she was seen by Dr. Derrell Lolling. Sural who evaluated her and felt that she did have stress urinary incontinence.  She is being admitted at this time for anterior colporrhaphy, perineorrhaphy and pubovaginal sling for cystocele and stress urinary incontinence.  PAST OBSTETRICAL HISTORY:  Significant for two vaginal deliveries at term without complications and one incomplete abortion, treated with a D&C.  PAST MEDICAL HISTORY:  Significant for a hiatal hernia and she has a heart murmur which has been evaluated by Dr. Luis Abed.  This evaluation has included an echocardiogram and an EKG.  The EKG reveals that she has a left bundle branch block and an intermittent atrial arrhythmia and she has not been on any medications for this.  The echocardiogram was normal.  She has had a Persantine Cardiolite stress test which shows no ischemia and no fixed defects.  PAST SURGICAL HISTORY:  Significant for tonsillectomy in the past, total abdominal hysterectomy with bilateral salpingo-oophorectomy in the 1970s and a recent rotator cuff repair  in November of 2000.  ALLERGIES:  No known drug allergies.  CURRENT MEDICATIONS: 1. Estrace 1.5 mg a day. 2. Detrol 2 mg b.i.d. 3. Histamine blocker for a hiatal hernia.  SOCIAL HISTORY:  Patient is married.  Denies alcohol, tobacco rule out drug use.  REVIEW OF SYSTEMS:  Review of systems is otherwise negative with normal bowel function.  FAMILY HISTORY:  Significant for no evidence of GYN or colon cancer.  PHYSICAL EXAMINATION:  VITAL SIGNS:  Weight is 203 pounds.  Blood pressure is 160/90.  Pulse is 88.  GENERAL:  She is a well-developed, well-nourished white female who is in no acute distress.  HEENT:  Pupils are equally round and reactive to light and accommodation and her extraocular muscles are intact.  Oropharynx is clear without erythema or exudates.  NECK:  Supple without lymphadenopathy or thyromegaly.  HEART:  Slightly irregular rhythm with a slightly irregular rate, with a 2/6 systolic murmur heard to the right of the sternal border.  LUNGS:  Clear to auscultation.  BACK:  Reveals an approximate 2-cm subcutaneous nodule in the middle of her right back which is mobile and slightly tender.  ABDOMEN:  Soft, nontender, nondistended, without palpable masses.  BREASTS:  Exam in the sitting and supine position revealed no dominant masses, adenopathy, skin change or nipple discharge.  EXTREMITIES:  Extremities have no edema,  are nontender and DTRs are 2/4 and symmetric.  PELVIC:  External genitalia reveals a grade 2 defect with no lesions.  On speculum exam, cervix is surgically absent but her vaginal cuff appears normal and has good vault support.  On bimanual exam, she has no adnexal masses but did have a fullness at the cuff and at the left adnexa on rectovaginal exam. She does have a grade 2 cystocele and no significant rectocele.  LABORATORY AND X-RAY FINDINGS:  Pertinent labs include a pelvic ultrasound which is without evidence of pelvic  masses.  ASSESSMENT:  Cystocele, perineal defect and stress urinary incontinence. Options have been discussed with the patient and she elects to proceed with surgical therapy.  Dr. Derrell Lolling Sural has been consulted and has agreed that she has stress urinary incontinence.  PLAN:  Plan is to admit the patient for anterior colporrhaphy with a pubovaginal sling performed by Dr. Mickel Crow and a perineorrhaphy.  Risks of the procedure have been discussed with patient including bleeding, infection and damage to surrounding organs and she understands this and wishes to proceed.  DD:  03/30/00 TD:  03/30/00 Job: [id number]

## 2011-01-30 NOTE — Op Note (Signed)
[facility]  Patient:    [name], [name]                        MRN: [id number] Proc. Date: 03/30/00 Adm. Date:  [id number] Attending:  Michaele Offer                           Operative Report  PREOPERATIVE DIAGNOSES:  Cystocele and perineal defect.  POSTOPERATIVE DIAGNOSES:  Cystocele and perineal defect.  PROCEDURE:  Anterior colporrhaphy and perineorrhaphy.  SURGEON:  Zenaida Niece, M.D.  ASSISTANT:  Claudette Laws, M.D.  ANESTHESIA:  General endotracheal tube.  ESTIMATED BLOOD LOSS:  300 cc.  CHEMOPROPHYLAXIS:  Ancef 1 g.  FINDINGS:  Grade 2 cystocele and grade 2 perineal defect.  She had excellent vaginal vault support and no appreciable rectocele.  Counts correct. Condition stable.  DESCRIPTION OF PROCEDURE:  After appropriate informed consent was obtained, the patient was taken to the operating room and placed in the dorsal supine position.  General anesthesia was induced, and she was placed in mobile stirrups.  Her lower abdomen, perineum, and vagina were prepped and draped in the usual sterile fashion and her bladder drained of approximately 1000 cc of urine with a red rubber catheter.  A weighted speculum was then inserted into the vagina, and the vaginal vault grasped with long Allis clamps.  A piece of vaginal mucosa was then removed sharply.  The anterior vaginal mucosa was dissected from the vaginal cuff to within 1 cm of the urethral meatus in the midline.  This mucosa was then dissected laterally to mobilize the cystocele. This was done sharply and bluntly.  Dr. Etta Grandchild then entered the field and placed his pubovaginal sling.  Once his sling was placed, the remainder of the cystocele was reduced by plicating the endopelvic fascia in the midline with interrupted sutures of 2-0 Vicryl.  This achieved excellent reduction of the cystocele.  Excess vaginal mucosa was then removed sharply, and the vagina closed with a  running locking suture of 2-0 Vicryl.  This was done after adequate hemostasis was achieved with electrocautery.  Attention was then turned to the perineorrhaphy.  The hymenal ring was grasped at a distance that would allow two fingers to easily enter the vagina.  A diamond-shaped incision was then made in the vaginal mucosa and perineal skin, and this piece of tissue was removed.  The bulbocavernosus muscles were then approximated in the midline with two interrupted sutures of 2-0 Vicryl.  The remainder of this was closed similar to an episiotomy with 3-0 Vicryl with good cosmesis and good perineal support.  The vagina was then irrigated with antibiotic solution and suctioned dry, and there was found to be no significant active bleeding.  The vagina was then packed with two-inch Iodoform gauze.  A Foley catheter was also inserted.  The patient was then taken down from stirrups, extubated in the operating room, and taken to the recovery room in stable condition after tolerating the procedure well. DD:  03/30/00 TD:  03/30/00 Job: [id number]

## 2011-02-25 ENCOUNTER — Other Ambulatory Visit: Payer: Self-pay | Admitting: Internal Medicine

## 2011-04-28 ENCOUNTER — Other Ambulatory Visit: Payer: Self-pay | Admitting: Internal Medicine

## 2011-04-28 NOTE — Telephone Encounter (Signed)
Rx Done . 

## 2011-05-17 ENCOUNTER — Emergency Department (HOSPITAL_BASED_OUTPATIENT_CLINIC_OR_DEPARTMENT_OTHER)
Admission: EM | Admit: 2011-05-17 | Discharge: 2011-05-17 | Disposition: A | Discharge status: Home / Self Care | Payer: Medicare Other | Attending: Emergency Medicine | Admitting: Emergency Medicine

## 2011-05-17 ENCOUNTER — Encounter (HOSPITAL_BASED_OUTPATIENT_CLINIC_OR_DEPARTMENT_OTHER): Payer: Self-pay | Admitting: *Deleted

## 2011-05-17 DIAGNOSIS — L0201 Cutaneous abscess of face: Secondary | ICD-10-CM | POA: Insufficient documentation

## 2011-05-17 DIAGNOSIS — L03211 Cellulitis of face: Secondary | ICD-10-CM | POA: Insufficient documentation

## 2011-05-17 DIAGNOSIS — K219 Gastro-esophageal reflux disease without esophagitis: Secondary | ICD-10-CM | POA: Insufficient documentation

## 2011-05-17 DIAGNOSIS — H9209 Otalgia, unspecified ear: Secondary | ICD-10-CM | POA: Insufficient documentation

## 2011-05-17 DIAGNOSIS — Z79899 Other long term (current) drug therapy: Secondary | ICD-10-CM | POA: Insufficient documentation

## 2011-05-17 DIAGNOSIS — I10 Essential (primary) hypertension: Secondary | ICD-10-CM | POA: Insufficient documentation

## 2011-05-17 MED ORDER — CLINDAMYCIN PHOSPHATE 600 MG/50ML IV SOLN
600.0000 mg | Freq: Once | INTRAVENOUS | Status: DC
  Filled 2011-05-17: qty 50

## 2011-05-17 MED ORDER — CEFAZOLIN SODIUM 1 G IJ SOLR
1.0000 g | Freq: Once | INTRAMUSCULAR | Status: AC
  Administered 2011-05-17: 1 g via INTRAMUSCULAR

## 2011-05-17 MED ORDER — CLINDAMYCIN HCL 300 MG PO CAPS: 300.0000 mg | ORAL_CAPSULE | Freq: Four times a day (QID) | ORAL | Status: AC

## 2011-05-17 MED ORDER — OXYCODONE-ACETAMINOPHEN 5-325 MG PO TABS: 2.0000 | ORAL_TABLET | ORAL | Status: AC | PRN

## 2011-05-17 MED ORDER — CEFAZOLIN SODIUM 1 G IJ SOLR
INTRAMUSCULAR | Status: AC
  Filled 2011-05-17: qty 10

## 2011-05-17 NOTE — ED Provider Notes (Signed)
Scribed for Dr. Fonnie Jarvis, the patient was seen in room 03. This chart was scribed by Hillery Hunter. This patient's care was started at 20:40.   History   CSN: [id number] Arrival date & time: 05/17/2011  8:37 PM  Chief Complaint  Patient presents with  . Otalgia   Patient is a 75 y.o. female presenting with ear pain. The history is provided by the patient.  Otalgia Associated symptoms include headaches. Pertinent negatives include no abdominal pain, no diarrhea, no vomiting, no cough and no rash.   THERMA LASURE is a 75 y.o. female who presents to the Emergency Department complaining of ear pain starting last night while the patient was sleeping, awaking her from sleep and persistent throughout the day. She describes some light-headedness without spinning dizziness. She has had gradual onset "all over" headache with some tenderness to the neck on the right side. She denies change in speech, vision, swallowing, walking, orientation, strength or sensation. She denies chest pain, cough, shortness of breath, vomiting, diarrhea, abdominal pain, rashes. She has a history of HTN, GERD.  Past Medical History  Diagnosis Date  . Hypertension   . Depression   . Osteopenia   . Hx of adenomatous colonic polyps   . Osteoarthritis   . History of gastroesophageal reflux (GERD)   . Hx of cardiac cath 2006    neg  . HOCM (hypertrophic obstructive cardiomyopathy)     ECHO 5-20ll mild AS but no evidence of HCM or MR--no further testing suggested     Past Surgical History  Procedure Date  . Cholecystectomy   . Abdominal hysterectomy   . Oophorectomy   . Appendectomy     (at time of hyst?)  . Bladder surgery   . Rotator cuff repair     right   . Toe surgery     R 2nd --- correction of claw toe     Family History  Problem Relation Age of Onset  . Coronary artery disease Father 43    had CABG  . Cancer Father     gallbladder  . Stroke Mother 61  . Ovarian cancer Sister 74  . Colon  cancer      NEGATIVE  . Breast cancer      NEGATIVE    History  Substance Use Topics  . Smoking status: Never Smoker   . Smokeless tobacco: Not on file  . Alcohol Use: 0.0 oz/week     wine      Review of Systems  Constitutional: Positive for fever.  HENT: Positive for ear pain.   Respiratory: Negative for cough and shortness of breath.   Cardiovascular: Negative for chest pain.  Gastrointestinal: Negative for vomiting, abdominal pain and diarrhea.  Skin: Negative for rash.  Neurological: Positive for light-headedness and headaches. Negative for dizziness, speech difficulty, weakness and numbness.  Psychiatric/Behavioral: Negative for hallucinations and confusion.  All other systems reviewed and are negative.    Physical Exam  BP 145/79  Pulse 132  Temp(Src) 102.3 F (39.1 C) (Oral)  Resp 20  Ht 5' 5.5" (1.664 m)  Wt 200 lb (90.719 kg)  BMI 32.78 kg/m2  SpO2 96%  Physical Exam  Nursing note and vitals reviewed. Constitutional:       Awake, alert, nontoxic appearance with baseline speech for patient.  HENT:  Head: Atraumatic.  Right Ear: Tympanic membrane is not injected.  Left Ear: Tympanic membrane is not injected.  Mouth/Throat: No oropharyngeal exudate.       buccal mucosa  without drainage or tenderness, gingiva and teeth were non-tender  Eyes: EOM are normal. Pupils are equal, round, and reactive to light. Right eye exhibits no discharge. Left eye exhibits no discharge.  Neck: Neck supple.       Right lateral posterior cheek has warmth, erythema, induration, tender but without obvious abscess  Cardiovascular: Regular rhythm.  Tachycardia present.   Murmur heard.  Systolic (soft) murmur is present  Pulmonary/Chest: Effort normal and breath sounds normal. No stridor. No respiratory distress. She has no wheezes. She has no rales. She exhibits no tenderness.  Abdominal: Soft. Bowel sounds are normal. She exhibits no mass. There is no tenderness. There is no  rebound.  Musculoskeletal: She exhibits no tenderness.       Baseline ROM, moves extremities with no obvious new focal weakness.  Lymphadenopathy:    She has no cervical adenopathy.  Neurological:       Awake, alert, cooperative and aware of situation; motor strength bilaterally; sensation normal to light touch bilaterally; peripheral visual fields full to confrontation; no facial asymmetry; major cranial nerves appear intact; baseline gait without new ataxia.  Skin: No rash noted.  Psychiatric: She has a normal mood and affect.    ED Course  Procedures  OTHER DATA REVIEWED: Nursing notes, vital signs, and past medical records reviewed.  DIAGNOSTIC STUDIES: Oxygen Saturation is 96% on room air, normal by my interpretation.     ED COURSE / COORDINATION OF CARE: 21:00. Ordered Clindamycin 600mg  IM  MDM: I doubt any other EMC precluding discharge at this time including, but not necessarily limited to the following:meningitis, sepsis, abscess.  IMPRESSION: Diagnoses that have been ruled out:  Diagnoses that are still under consideration:  Final diagnoses:    PLAN:  Discharge home The patient is to return the emergency department if there is any worsening of symptoms: dizziness, uncontrollable pain, confusion, syncope. I have reviewed the discharge instructions with the patient and spouse  CONDITION ON DISCHARGE: Stable  MEDICATIONS GIVEN IN THE E.D.  Medications  solifenacin (VESICARE) 10 MG tablet (not administered)  clindamycin (CLEOCIN) IVPB 600 mg (not administered)    DISCHARGE MEDICATIONS: New Prescriptions   No medications on file    SCRIBE ATTESTATION: I personally performed the services described in this documentation, which was scribed in my presence. The recorded information has been reviewed and considered. No att. providers found    Hurman Horn, MD 05/18/11 1256

## 2011-05-17 NOTE — ED Notes (Signed)
Pt states she has had right ear pain and chills since this a.m. Had temporary cap on that side last week. Feels dizzy.

## 2011-05-27 ENCOUNTER — Ambulatory Visit: Payer: Medicare Other | Admitting: Internal Medicine

## 2011-06-08 LAB — URINALYSIS, ROUTINE W REFLEX MICROSCOPIC
Bilirubin Urine: NEGATIVE
Glucose, UA: NEGATIVE
Hgb urine dipstick: NEGATIVE
Ketones, ur: NEGATIVE
Protein, ur: NEGATIVE
Urobilinogen, UA: 0.2

## 2011-06-08 LAB — BASIC METABOLIC PANEL
BUN: 8
CO2: 29
CO2: 31
Calcium: 8.7
Calcium: 9.5
Chloride: 103
Chloride: 103
Creatinine, Ser: 0.51
GFR calc Af Amer: >60
Glucose, Bld: 103 — ABNORMAL HIGH
Glucose, Bld: 115 — ABNORMAL HIGH
Sodium: 141

## 2011-06-08 LAB — CBC
Hemoglobin: 11.7 — ABNORMAL LOW
MCHC: 34.7
MCHC: 34.9
MCV: 90.2
MCV: 91.5
Platelets: 249
RBC: 3.14 — ABNORMAL LOW
RBC: 3.72 — ABNORMAL LOW
RDW: 12.4
RDW: 12.7

## 2011-06-18 ENCOUNTER — Ambulatory Visit: Payer: Medicare Other | Admitting: Internal Medicine

## 2011-06-25 ENCOUNTER — Other Ambulatory Visit: Payer: Self-pay | Admitting: Internal Medicine

## 2011-06-25 NOTE — Telephone Encounter (Signed)
Effexor request [last refill 12/17/10 #30x6]

## 2011-06-26 NOTE — Telephone Encounter (Signed)
Okay 3 months supply, schedule a  office visit if she has not one schedule already

## 2011-06-26 NOTE — Telephone Encounter (Signed)
Done

## 2011-07-09 ENCOUNTER — Ambulatory Visit: Payer: Medicare Other | Admitting: Internal Medicine

## 2011-07-15 ENCOUNTER — Ambulatory Visit (INDEPENDENT_AMBULATORY_CARE_PROVIDER_SITE_OTHER): Payer: Medicare Other | Admitting: Internal Medicine

## 2011-07-15 VITALS — BP 144/78 | HR 98 | Temp 98.3°F | Resp 20 | Ht 67.0 in | Wt 204.1 lb

## 2011-07-15 DIAGNOSIS — N390 Urinary tract infection, site not specified: Secondary | ICD-10-CM

## 2011-07-15 DIAGNOSIS — R197 Diarrhea, unspecified: Secondary | ICD-10-CM

## 2011-07-15 DIAGNOSIS — Z23 Encounter for immunization: Secondary | ICD-10-CM

## 2011-07-15 LAB — POCT URINALYSIS DIPSTICK
Ketones, UA: NEGATIVE
Protein, UA: 30
pH, UA: 5

## 2011-07-15 MED ORDER — METRONIDAZOLE 500 MG PO TABS: 500.0000 mg | ORAL_TABLET | Freq: Three times a day (TID) | ORAL | Status: AC

## 2011-07-15 NOTE — Progress Notes (Signed)
  Subjective:    Patient ID: [name], female    DOB: [birth year], 75 y.o.   MRN: [id number]  HPI When to see the  gynecologist a month ago, diagnosed with a UTI, she was asymptomatic at the time. She took antibiotics for 10 days. Shortly after she started the antibiotic she developed watery diarrhea, initially saw mucus on the stools; now the mucus is gone but she still has diarrhea.  Past Medical History  Diagnosis Date  . Hypertension   . Depression   . Osteopenia   . Hx of adenomatous colonic polyps   . Osteoarthritis   . History of gastroesophageal reflux (GERD)   . Hx of cardiac cath 2006    neg  . HOCM (hypertrophic obstructive cardiomyopathy)     ECHO 5-20ll mild AS but no evidence of HCM or MR--no further testing suggested    Past Surgical History  Procedure Date  . Cholecystectomy   . Abdominal hysterectomy   . Oophorectomy   . Appendectomy     (at time of hyst?)  . Bladder surgery   . Rotator cuff repair     right   . Toe surgery     R 2nd --- correction of claw toe     Review of Systems Denies fever or chills Sometimes she has lower abdominal cramps. Sometimes the bowel movements are associated with nausea and cold sweats, no syncope. No blood in the stools. As far as a UTI, she continued to be asymptomatic. No dysuria or gross hematuria    Objective:   Physical Exam  Constitutional: She appears well-developed and well-nourished.  HENT:  Head: Normocephalic and atraumatic.  Abdominal: Soft. Bowel sounds are normal. She exhibits no distension. There is no tenderness. There is no rebound and no guarding.       No CVA tenderness          Assessment & Plan:  Diarrhea: sx  suspicious for C diff given the sequence of events. She is not toxic. Plan: Stool studies, after that start empiric Flagyl. If the stools studies are negative, I will asked her to finish the antibiotics and do further workup if she continues with symptoms.  UTI: Diagnosed  elsewhere, she was asymptomatic, we will recheck a urinalysis.  Chronic medical problems: She seems to be a stable, last labs great

## 2011-07-15 NOTE — Patient Instructions (Addendum)
Please go to the lab : get a UA UCX ---dx UTI Also needs pick up containers for stools sample,  bring them ASAP Cdiff-WBC- culture---dx diarrhea After you bring the samples, start taking flagyl Call if no better in 3 weeks

## 2011-07-17 LAB — CLOSTRIDIUM DIFFICILE EIA: CDIFTX: NEGATIVE

## 2011-07-20 LAB — STOOL CULTURE

## 2011-07-21 ENCOUNTER — Telehealth: Payer: Self-pay | Admitting: Internal Medicine

## 2011-07-21 MED ORDER — CIPROFLOXACIN HCL 500 MG PO TABS: 500.0000 mg | ORAL_TABLET | Freq: Two times a day (BID) | ORAL | Status: AC

## 2011-07-21 NOTE — Telephone Encounter (Signed)
Patient called: Aware that Cdiff was  negative, diarrhea is better. She still has a asymptomatic UTI Plan: Finish Flagyl Cipro 500 twice a day for one week Urine culture in 3 weeks, DX UTI, patient will call and make an appointment.

## 2011-07-22 ENCOUNTER — Ambulatory Visit: Payer: Medicare Other | Admitting: Internal Medicine

## 2011-08-13 ENCOUNTER — Ambulatory Visit (INDEPENDENT_AMBULATORY_CARE_PROVIDER_SITE_OTHER): Payer: Medicare Other | Admitting: Internal Medicine

## 2011-08-13 ENCOUNTER — Encounter: Payer: Self-pay | Admitting: Internal Medicine

## 2011-08-13 VITALS — BP 120/80 | HR 98 | Temp 98.3°F | Wt 205.4 lb

## 2011-08-13 DIAGNOSIS — R197 Diarrhea, unspecified: Secondary | ICD-10-CM

## 2011-08-13 DIAGNOSIS — N280 Ischemia and infarction of kidney: Secondary | ICD-10-CM

## 2011-08-13 DIAGNOSIS — N390 Urinary tract infection, site not specified: Secondary | ICD-10-CM | POA: Insufficient documentation

## 2011-08-13 DIAGNOSIS — N2889 Other specified disorders of kidney and ureter: Secondary | ICD-10-CM

## 2011-08-13 LAB — POCT URINALYSIS DIPSTICK
Bilirubin, UA: NEGATIVE
Nitrite, UA: NEGATIVE
Protein, UA: NEGATIVE
pH, UA: 7.5

## 2011-08-13 NOTE — Assessment & Plan Note (Signed)
Recently diagnosed with 2 asymptomatic UTIs. Last UTI was treated with Cipro. We'll send a urine culture, if it is negative we'll recheck in a few months when she comes back. If you continue having asymptomatic UTIs, will refer back to gynecology or urology. She does have some nocturia, on VESIcare which helps

## 2011-08-13 NOTE — Progress Notes (Signed)
  Subjective:    Patient ID: [name], female    DOB: [birth year], 75 y.o.   MRN: [id number]  HPI Followup from previous visit Had diarrhea, was prescribed Flagyl, symptoms totally resolved, she is back to normal. Was diagnosed with an asymptomatic UTI, status post Cipro.  Past Medical History  Diagnosis Date  . Hypertension   . Depression   . Osteopenia   . Hx of adenomatous colonic polyps   . Osteoarthritis   . History of gastroesophageal reflux (GERD)   . Hx of cardiac cath 2006    neg  . HOCM (hypertrophic obstructive cardiomyopathy)     ECHO 5-20ll mild AS but no evidence of HCM or MR--no further testing suggested      Review of Systems No nausea, vomiting. No dysuria gross hematuria. No history of previous UTIs She has frequency and has the feeling that she does not empty her bladder all the way, symptoms are mostly at night and helped by vesicare     Objective:   Physical Exam  Constitutional: She appears well-developed and well-nourished.  HENT:  Head: Atraumatic.  Abdominal: Soft. Bowel sounds are normal. She exhibits no distension. There is no tenderness. There is no rebound and no guarding.       No CVA tenderness       Assessment & Plan:  Diarrhea:  See previous visit. Stool studies were positive for lactoferrin but otherwise negative. She is now completely asymptomatic after Flagyl. No further testing is indicated at this time

## 2011-08-14 ENCOUNTER — Other Ambulatory Visit: Payer: Self-pay | Admitting: Internal Medicine

## 2011-09-23 ENCOUNTER — Other Ambulatory Visit: Payer: Self-pay | Admitting: Internal Medicine

## 2011-09-23 MED ORDER — SIMVASTATIN 20 MG PO TABS: 20.0000 mg | ORAL_TABLET | Freq: Every day | ORAL | Status: DC

## 2011-09-23 MED ORDER — VENLAFAXINE HCL ER 37.5 MG PO CP24: ORAL_CAPSULE | ORAL | Status: DC

## 2011-09-23 NOTE — Telephone Encounter (Signed)
Rx sent 

## 2011-10-27 ENCOUNTER — Other Ambulatory Visit: Payer: Self-pay | Admitting: Internal Medicine

## 2011-10-28 NOTE — Telephone Encounter (Signed)
Refill done.  

## 2011-12-08 ENCOUNTER — Encounter: Payer: Self-pay | Admitting: Internal Medicine

## 2011-12-24 ENCOUNTER — Other Ambulatory Visit: Payer: Self-pay | Admitting: Internal Medicine

## 2011-12-24 NOTE — Telephone Encounter (Signed)
Refill done.  

## 2012-01-12 ENCOUNTER — Encounter: Payer: Self-pay | Admitting: Internal Medicine

## 2012-01-12 ENCOUNTER — Ambulatory Visit (INDEPENDENT_AMBULATORY_CARE_PROVIDER_SITE_OTHER): Payer: Medicare Other | Admitting: Internal Medicine

## 2012-01-12 DIAGNOSIS — E785 Hyperlipidemia, unspecified: Secondary | ICD-10-CM

## 2012-01-12 DIAGNOSIS — Z0000 Encounter for general adult medical examination without abnormal findings: Secondary | ICD-10-CM

## 2012-01-12 DIAGNOSIS — M899 Disorder of bone, unspecified: Secondary | ICD-10-CM

## 2012-01-12 DIAGNOSIS — M949 Disorder of cartilage, unspecified: Secondary | ICD-10-CM

## 2012-01-12 DIAGNOSIS — I10 Essential (primary) hypertension: Secondary | ICD-10-CM

## 2012-01-12 DIAGNOSIS — F329 Major depressive disorder, single episode, unspecified: Secondary | ICD-10-CM

## 2012-01-12 LAB — CBC WITH DIFFERENTIAL/PLATELET
Basophils Absolute: 0.1 10*3/uL (ref 0.0–0.1)
Eosinophils Absolute: 0.2 10*3/uL (ref 0.0–0.7)
MCHC: 33.5 g/dL (ref 30.0–36.0)
MCV: 91.6 fl (ref 78.0–100.0)
Monocytes Absolute: 0.5 10*3/uL (ref 0.1–1.0)
Neutrophils Relative %: 56.7 % (ref 43.0–77.0)
Platelets: 205 10*3/uL (ref 150.0–400.0)
RDW: 12.9 % (ref 11.5–14.6)

## 2012-01-12 LAB — LIPID PANEL
Cholesterol: 165 mg/dL (ref 0–200)
Total CHOL/HDL Ratio: 3
Triglycerides: 136 mg/dL (ref 0.0–149.0)

## 2012-01-12 LAB — BASIC METABOLIC PANEL
BUN: 25 mg/dL — ABNORMAL HIGH (ref 6–23)
Calcium: 9.6 mg/dL (ref 8.4–10.5)
GFR: 71.45 mL/min (ref >60.00)
Glucose, Bld: 98 mg/dL (ref 70–99)

## 2012-01-12 NOTE — Assessment & Plan Note (Signed)
No change Labs 

## 2012-01-12 NOTE — Assessment & Plan Note (Signed)
Well controlled at present. Previously her relationship with her son was poor but now is very good.

## 2012-01-12 NOTE — Assessment & Plan Note (Addendum)
Td 2008 pneumonia shot 2005 and 2010 Shingles immunization done @ [facility] per Gynecology (female exam, [facility]) last colonoscopy 01/2009, next 2013-----> will send a flag to Dr Leone Payor  (d/w GI , next cscope  01-2014) Diet and exercise discussed

## 2012-01-12 NOTE — Assessment & Plan Note (Signed)
DEXA from 04-2010, osteopenia worse with more sites w/  T scores  of -1.8 Good compliance with calcium and vitamin D. She could do more exercise--- counseled Had a history of low vitamin D---> Labs

## 2012-01-12 NOTE — Assessment & Plan Note (Signed)
Good ompliance with medications, labs

## 2012-01-12 NOTE — Progress Notes (Signed)
  Subjective:    Patient ID: [name], female    DOB: 1936-02-07, 76 y.o.   MRN: [id number]  HPI Here for Medicare AWV: 1. Risk factors based on Past M, S, F history: reviewed 2. Physical Activities:  Home chores, some yard work, no routine exercise  3. Depression/mood: No problemss noted or reported  4. Hearing: No problemss noted or reported  5. ADL's:  Completely independent  6. Fall Risk: no recent falls, counseled about prevention 7. home Safety: does feel safe at home  8. Height, weight, &visual acuity: see VS, uses glasses, see eye doctor routinely  9. Counseling: provided 10. Labs ordered based on risk factors: if needed  11. Referral Coordination: if needed 12.  Care Plan, see assessment and plan  13.   Cognitive Assessment: motor skills and cognition wnl  In addition, today we discussed the following: High cholesterol, good compliance with Zocor, no apparent side effects. Hypertension, good compliance with medication. No ambulatory BPs, BP today normal  DJD, pain is at a minimum. Essentially not requiring pain medication. Osteopenia, good compliance with calcium and vitamin D.  Past Medical History: Hypertension Hyperlipidemia Depression Osteopenia DJD GERD OAB Cardiovascular: ---Cardiac cath 2006, neg  ---h/o HOCM (hypertrophic obstructive cardiomyopathy) , saw cards,  ECHO 5-11 Mild AS but no evidence of HCM or MR ----> no further testing suggested   Past Surgical History: Cholecystectomy Hysterectomy, Oophorectomy Appendectomy (at time of hyst?) Bladder surgery Rotator cuff repair (R) R 2nd toe surgery  Social History: Married, 2 kids tobacco--no ETOH-- rarely has wine  diet--  ok most of the time   Family History: colon ca--no breast ca--no her identical twin dx ovarian Ca, patient saw her gyn, they did  a test  (?CA 125) and it came back ok -- DECEASED 08/31/09 MI--no CAD-- F had CABG at 76y/o GB cancer -- F stroke--M   Review of  Systems No chest pain or shortness of breath No nausea, vomiting, diarrhea. No blood in the stools. She has a history of overactive bladder, sees gynecology, she still has some frequency and urgency, no gross hematuria.     Objective:   Physical Exam  General -- alert, well-developed, and well-nourished.   Neck --no thyromegaly , normal carotid pulse Lungs -- normal respiratory effort, no intercostal retractions, no accessory muscle use, and normal breath sounds.   Heart-- normal rate, regular rhythm, + murmur systolic more noticeable at the  right side of the sternum. Abdomen--soft, non-tender, no distention, no masses, no HSM, no guarding, and no rigidity.   Extremities-- no pretibial edema bilaterally Neurologic-- alert & oriented X3 and strength normal in all extremities. Psych-- Cognition and judgment appear intact. Alert and cooperative with normal attention span and concentration.  not anxious appearing and not depressed appearing.       Assessment & Plan:

## 2012-01-14 ENCOUNTER — Encounter: Payer: Self-pay | Admitting: *Deleted

## 2012-03-24 ENCOUNTER — Other Ambulatory Visit: Payer: Self-pay | Admitting: Internal Medicine

## 2012-03-25 NOTE — Telephone Encounter (Signed)
Refill done.  

## 2012-04-01 ENCOUNTER — Telehealth: Payer: Self-pay | Admitting: Internal Medicine

## 2012-04-01 MED ORDER — NIFEDIPINE ER OSMOTIC RELEASE 60 MG PO TB24: 60.0000 mg | ORAL_TABLET | Freq: Every day | ORAL | Status: DC

## 2012-04-01 MED ORDER — SIMVASTATIN 20 MG PO TABS: 20.0000 mg | ORAL_TABLET | Freq: Every day | ORAL | Status: DC

## 2012-04-01 NOTE — Telephone Encounter (Signed)
Pt states she needs refills on 2 medications. She called her pharmacy last week and today and they instructed her to call her doctor to get the medications refilled. Simvastatin 20mg  Nifedical xl 60mg   Pt uses [facility] on [address] in [place]

## 2012-04-01 NOTE — Telephone Encounter (Signed)
Refill done.  

## 2012-04-26 ENCOUNTER — Other Ambulatory Visit: Payer: Self-pay | Admitting: Internal Medicine

## 2012-06-01 ENCOUNTER — Ambulatory Visit (INDEPENDENT_AMBULATORY_CARE_PROVIDER_SITE_OTHER): Payer: Medicare Other | Admitting: *Deleted

## 2012-06-01 DIAGNOSIS — Z23 Encounter for immunization: Secondary | ICD-10-CM

## 2012-07-13 ENCOUNTER — Encounter: Payer: Self-pay | Admitting: *Deleted

## 2012-07-13 ENCOUNTER — Encounter: Payer: Self-pay | Admitting: Internal Medicine

## 2012-07-13 ENCOUNTER — Ambulatory Visit (INDEPENDENT_AMBULATORY_CARE_PROVIDER_SITE_OTHER): Payer: Medicare Other | Admitting: Internal Medicine

## 2012-07-13 VITALS — BP 142/86 | HR 76 | Temp 98.0°F | Wt 201.0 lb

## 2012-07-13 DIAGNOSIS — N318 Other neuromuscular dysfunction of bladder: Secondary | ICD-10-CM

## 2012-07-13 DIAGNOSIS — N3281 Overactive bladder: Secondary | ICD-10-CM | POA: Insufficient documentation

## 2012-07-13 DIAGNOSIS — R079 Chest pain, unspecified: Secondary | ICD-10-CM

## 2012-07-13 HISTORY — DX: Overactive bladder: N32.81

## 2012-07-13 LAB — BASIC METABOLIC PANEL
CO2: 28 mEq/L (ref 19–32)
Calcium: 9.7 mg/dL (ref 8.4–10.5)
GFR: 71.36 mL/min (ref >60.00)
Glucose, Bld: 89 mg/dL (ref 70–99)
Potassium: 3.5 mEq/L (ref 3.5–5.1)
Sodium: 141 mEq/L (ref 135–145)

## 2012-07-13 LAB — POCT URINALYSIS DIPSTICK
Glucose, UA: NEGATIVE
Ketones, UA: NEGATIVE
Spec Grav, UA: 1.01
Urobilinogen, UA: 0.2

## 2012-07-13 MED ORDER — DARIFENACIN HYDROBROMIDE ER 7.5 MG PO TB24: 7.5000 mg | ORAL_TABLET | Freq: Every day | ORAL | Status: DC

## 2012-07-13 NOTE — Assessment & Plan Note (Addendum)
New problem . History of overactive bladder, history of 2 bladder tucks. Previously he has seen gynecology and urology. previously tried vesicare and currently on oxybutynin, still has urinary frequency mostly at night. udip some leukocytes Plan: Urine culture, trial with enablex, f/u 3 months

## 2012-07-13 NOTE — Patient Instructions (Signed)
Discontinue oxybutynin, start Enablex, may increase to 2 tablets at night if needed. If you have more episodes of chest pain or any severe episodes, let me know or go to the ER.

## 2012-07-13 NOTE — Progress Notes (Signed)
  Subjective:    Patient ID: [name], female    DOB: 1936-08-29, 76 y.o.   MRN: [id number]  HPI Routine office visit Today she reports a long history of over active bladder, current treatment not working, see assessment and plan Also 2 days ago had a single episode of chest pain, at the upper-anterior-bilateral chest, lasted a few minutes, no radiation. She was doing laundry, has not experienced chest pain  doing more strenuous physical activity. There was no associated shortness of breath, sweats, nausea. She did fly from [facility] a week ago. Experience some bilateral lower extremity edema. No calf pain.  Past Medical History: Hypertension Hyperlipidemia Depression Osteopenia DJD GERD OAB Cardiovascular: ---Cardiac cath 2006, neg   ---h/o HOCM (hypertrophic obstructive cardiomyopathy) , saw cards,  ECHO 5-11 Mild AS but no evidence of HCM or MR ----> no further testing suggested    Past Surgical History: Cholecystectomy Hysterectomy, Oophorectomy Appendectomy (at time of hyst?) Bladder surgery x 2  Rotator cuff repair (R) R 2nd toe surgery  Social History: Married, 2 kids tobacco--no ETOH-- rarely has wine   diet--  ok most of the time   Family History: colon ca--no breast ca--no her identical twin dx ovarian Ca, patient saw her gyn, they did  a test  (?CA 125) and it came back ok -- DECEASED 2009/09/03 MI--no CAD-- F had CABG at 76y/o GB cancer -- F stroke--M   Review of Systems See HPI Also denies dysuria, gross hematuria or difficulty urinating    Objective:   Physical Exam  General -- alert, well-developed, and overweight appearing. No apparent distress.  Lungs -- normal respiratory effort, no intercostal retractions, no accessory muscle use, and normal breath sounds.  Chest wall w/o reproducible pain Heart-- normal rate, regular rhythm, + syst  murmur, and no gallop.   Extremities-- no pretibial edema bilaterally , calves symmetric and no tender    Neurologic-- alert & oriented X3 and strength normal in all extremities. Psych-- Cognition and judgment appear intact. Alert and cooperative with normal attention span and concentration.  not anxious appearing and not depressed appearing.       Assessment & Plan:

## 2012-07-13 NOTE — Assessment & Plan Note (Addendum)
Single episode of chest pain, EKG LBBB which is at baseline. Symptoms are atypical for heart disease, no history of diabetes. She took a recent airplane trip, plan: BMP, d-dimer  if positive consider a CT chest

## 2012-07-15 MED ORDER — CIPROFLOXACIN HCL 250 MG PO TABS: 250.0000 mg | ORAL_TABLET | Freq: Two times a day (BID) | ORAL | Status: DC

## 2012-07-15 NOTE — Addendum Note (Signed)
Addended by: Edwena Felty T on: 07/15/2012 05:35 PM   Modules accepted: Orders

## 2012-07-16 LAB — URINE CULTURE: Colony Count: >=100000

## 2012-07-21 ENCOUNTER — Telehealth: Payer: Self-pay

## 2012-07-21 NOTE — Telephone Encounter (Signed)
Pt called [facility] stating she came to appt last Thursday and you called in bladder meds (cipro?)for her but she can't afford them. Pt states her ins won't cover it and its $200. Pharmacy and pt is asking for an alternate med or a medical necessity paper for ins. To cover Rx.    Plz advise   MW

## 2012-07-22 MED ORDER — SULFAMETHOXAZOLE-TRIMETHOPRIM 800-160 MG PO TABS: 1.0000 | ORAL_TABLET | Freq: Two times a day (BID) | ORAL | Status: DC

## 2012-07-22 NOTE — Telephone Encounter (Signed)
Change to Bactrim DS 1 by mouth twice a day for one week, #14, no refills

## 2012-07-22 NOTE — Telephone Encounter (Signed)
Discussed with pt, sent rx to pharmacy.  

## 2012-07-30 ENCOUNTER — Other Ambulatory Visit: Payer: Self-pay | Admitting: Internal Medicine

## 2012-08-01 NOTE — Telephone Encounter (Signed)
She really should try a different medication. Ciprofloxacin 500 mg one by mouth twice a day #14, no refills.  lets try this again, I know  she said it was expensive but I like her to try again, she needs to ask for the  generic

## 2012-08-01 NOTE — Telephone Encounter (Signed)
Pt states she is still having to urinate frequently. OK to refill?

## 2012-08-02 MED ORDER — CIPROFLOXACIN HCL 500 MG PO TABS: 500.0000 mg | ORAL_TABLET | Freq: Two times a day (BID) | ORAL | Status: DC

## 2012-08-02 NOTE — Telephone Encounter (Signed)
Discussed with pt, i sent cipro to pharmacy & specified to fill generic only. Pt will let us know if she is unable to afford it.

## 2012-08-03 ENCOUNTER — Other Ambulatory Visit: Payer: Self-pay | Admitting: Internal Medicine

## 2012-08-03 NOTE — Telephone Encounter (Signed)
refill Sulfamethoxazole-Trimethoprim (Tab) 800-160 MG Take 1 tablet by mouth 2 (two) times daily. # 14  last fill 11.8.13

## 2012-08-03 NOTE — Telephone Encounter (Signed)
Pt [facility] stating med sent in yesterday was for infection and was the wrong med. Pt states need Rx for bladder control. Pt requesting a CB to advise. CB: [phone]

## 2012-08-03 NOTE — Telephone Encounter (Signed)
Okay, she does have a UTI and needs to take Cipro. As far as the incontinence, she has a long history of problems, she tried VESIcare, oxybutinin and that didn't help. Enablex is expensive. I don't have many options. 1. She could go back to urology 2. We could give her samples of Myrbetriq 25 mg one by mouth daily. If that works, she may like to get it although is also expensive.

## 2012-08-03 NOTE — Telephone Encounter (Signed)
Spoke with pt, she states she does not have symptoms of an infection but would like to have something to help with her incontinence. Please advise.

## 2012-08-03 NOTE — Telephone Encounter (Signed)
Discussed with pt. Samples left at front desk for pt to pick up tomorrow.

## 2012-08-29 ENCOUNTER — Other Ambulatory Visit: Payer: Self-pay | Admitting: Internal Medicine

## 2012-08-29 NOTE — Telephone Encounter (Signed)
Refill done.  

## 2012-10-13 ENCOUNTER — Ambulatory Visit (INDEPENDENT_AMBULATORY_CARE_PROVIDER_SITE_OTHER): Payer: BC Managed Care – PPO | Admitting: Internal Medicine

## 2012-10-13 ENCOUNTER — Encounter: Payer: Self-pay | Admitting: Internal Medicine

## 2012-10-13 VITALS — BP 130/84 | HR 85 | Temp 98.1°F | Wt 198.0 lb

## 2012-10-13 DIAGNOSIS — N3281 Overactive bladder: Secondary | ICD-10-CM

## 2012-10-13 DIAGNOSIS — R079 Chest pain, unspecified: Secondary | ICD-10-CM

## 2012-10-13 DIAGNOSIS — N318 Other neuromuscular dysfunction of bladder: Secondary | ICD-10-CM

## 2012-10-13 DIAGNOSIS — N390 Urinary tract infection, site not specified: Secondary | ICD-10-CM

## 2012-10-13 DIAGNOSIS — I10 Essential (primary) hypertension: Secondary | ICD-10-CM

## 2012-10-13 NOTE — Assessment & Plan Note (Signed)
Well-controlled, no change 

## 2012-10-13 NOTE — Progress Notes (Signed)
  Subjective:    Patient ID: [name], female    DOB: 04/25/1936, 77 y.o.   MRN: [id number]  HPI Routine office visit In general doing well, last time she was here, had urinary symptoms felt to be due to overactive bladder, eventually a urine culture show a UTI. She was treated. Current doing great, taking Ditropan daily, denies any visual disturbances, occasional dry mouth which is not bothering her.  Past Medical History  Diagnosis Date  . Hypertension   . Depression   . Osteopenia   . Hx of adenomatous colonic polyps   . Osteoarthritis   . History of gastroesophageal reflux (GERD)   . Hx of cardiac cath 2006    neg  . HOCM (hypertrophic obstructive cardiomyopathy)     ECHO 5-20ll mild AS but no evidence of HCM or MR--no further testing suggested     Past Surgical History  Procedure Date  . Cholecystectomy   . Abdominal hysterectomy   . Oophorectomy   . Appendectomy     (at time of hyst?)  . Bladder surgery   . Rotator cuff repair     right   . Toe surgery     R 2nd --- correction of claw toe     Review of Systems Medications reviewed and updated, good medication compliance, HRT was discontinued by gynecology No chest pain or shortness of breath No nausea, vomiting, diarrhea. Depression well controlled on medications.     Objective:   Physical Exam General -- alert, well-developed Lungs -- normal respiratory effort, no intercostal retractions, no accessory muscle use, and normal breath sounds.   Heart-- normal rate, regular rhythm, no murmur, and no gallop.   Extremities-- no pretibial edema bilaterally Psych-- Cognition and judgment appear intact. Alert and cooperative with normal attention span and concentration.  not anxious appearing and not depressed appearing.       Assessment & Plan:

## 2012-10-13 NOTE — Assessment & Plan Note (Signed)
Since the last time she was here, she was found to have a UTI, was treated. She was given samples of enablex but is currently taking Ditropan. Feeling very well, continue Ditropan. Recheck a urine to be sure the infection is gone

## 2012-10-13 NOTE — Assessment & Plan Note (Signed)
Resolved

## 2012-10-15 LAB — CULTURE, URINE COMPREHENSIVE

## 2012-12-20 ENCOUNTER — Telehealth: Payer: Self-pay | Admitting: Internal Medicine

## 2012-12-20 MED ORDER — VENLAFAXINE HCL ER 37.5 MG PO CP24: ORAL_CAPSULE | ORAL | Status: DC

## 2012-12-20 NOTE — Telephone Encounter (Signed)
Refill done.  

## 2012-12-20 NOTE — Telephone Encounter (Signed)
Refill: Venlafaxine er 37.5 caps. Qty 90.

## 2012-12-29 MED ORDER — VENLAFAXINE HCL ER 37.5 MG PO CP24: ORAL_CAPSULE | ORAL | Status: DC

## 2012-12-29 NOTE — Telephone Encounter (Signed)
Pt left VM that pharmacy still does not have rx. Rx sent to incorrect pharmacy. Rx Resent to corrected pharmacy. Pt aware.

## 2012-12-29 NOTE — Addendum Note (Signed)
Addended byDuaine Dredge, Previn Jian L on: 12/29/2012 11:03 AM   Modules accepted: Orders

## 2013-02-13 ENCOUNTER — Encounter: Payer: Self-pay | Admitting: Internal Medicine

## 2013-02-13 ENCOUNTER — Ambulatory Visit (INDEPENDENT_AMBULATORY_CARE_PROVIDER_SITE_OTHER): Payer: BC Managed Care – PPO | Admitting: Internal Medicine

## 2013-02-13 VITALS — BP 130/84 | HR 74 | Temp 98.3°F | Wt 198.0 lb

## 2013-02-13 DIAGNOSIS — N3281 Overactive bladder: Secondary | ICD-10-CM

## 2013-02-13 DIAGNOSIS — F329 Major depressive disorder, single episode, unspecified: Secondary | ICD-10-CM

## 2013-02-13 DIAGNOSIS — I350 Nonrheumatic aortic (valve) stenosis: Secondary | ICD-10-CM

## 2013-02-13 DIAGNOSIS — I359 Nonrheumatic aortic valve disorder, unspecified: Secondary | ICD-10-CM

## 2013-02-13 DIAGNOSIS — Z0000 Encounter for general adult medical examination without abnormal findings: Secondary | ICD-10-CM

## 2013-02-13 DIAGNOSIS — M899 Disorder of bone, unspecified: Secondary | ICD-10-CM

## 2013-02-13 DIAGNOSIS — F3289 Other specified depressive episodes: Secondary | ICD-10-CM

## 2013-02-13 DIAGNOSIS — I10 Essential (primary) hypertension: Secondary | ICD-10-CM

## 2013-02-13 DIAGNOSIS — M949 Disorder of cartilage, unspecified: Secondary | ICD-10-CM

## 2013-02-13 DIAGNOSIS — E785 Hyperlipidemia, unspecified: Secondary | ICD-10-CM

## 2013-02-13 LAB — BASIC METABOLIC PANEL
Calcium: 9.7 mg/dL (ref 8.4–10.5)
GFR: 68.38 mL/min (ref >60.00)
Sodium: 140 mEq/L (ref 135–145)

## 2013-02-13 LAB — LIPID PANEL
HDL: 44.8 mg/dL (ref >39.00)
Total CHOL/HDL Ratio: 4
Triglycerides: 115 mg/dL (ref 0.0–149.0)
VLDL: 23 mg/dL (ref 0.0–40.0)

## 2013-02-13 LAB — CBC WITH DIFFERENTIAL/PLATELET
Basophils Relative: 1.2 % (ref 0.0–3.0)
Hemoglobin: 12.8 g/dL (ref 12.0–15.0)
Lymphocytes Relative: 33.2 % (ref 12.0–46.0)
Monocytes Relative: 10.4 % (ref 3.0–12.0)
Neutro Abs: 2.4 10*3/uL (ref 1.4–7.7)
RBC: 4.26 Mil/uL (ref 3.87–5.11)
WBC: 4.7 10*3/uL (ref 4.5–10.5)

## 2013-02-13 LAB — ALT: ALT: 17 U/L (ref 0–35)

## 2013-02-13 LAB — AST: AST: 16 U/L (ref 0–37)

## 2013-02-13 MED ORDER — OMEPRAZOLE 20 MG PO CPDR: DELAYED_RELEASE_CAPSULE | ORAL | Status: DC

## 2013-02-13 NOTE — Assessment & Plan Note (Signed)
Labs

## 2013-02-13 NOTE — Assessment & Plan Note (Signed)
Well controlled, labs  

## 2013-02-13 NOTE — Assessment & Plan Note (Addendum)
At some point diagnosed with HOCM---> Last echocardiogram was 01-2010: "Mild AS but no evidence of HCM or MR.  No further work up indicated at this time". Murmur today at baseline, she is asymptomatic.

## 2013-02-13 NOTE — Progress Notes (Signed)
  Subjective:    Patient ID: [name], female    DOB: 08-Dec-1935, 77 y.o.   MRN: [id number]  HPI Routine office visit Hypertension, good medication compliance, not ambulatory BPs, BP today very good. Overactive bladder, symptoms well controlled, she was switch from ditropan to vesicare. Depression, on Effexor, symptoms well controlled. Osteopenia, good compliance with calcium and vitamin D.  Past Medical History  Diagnosis Date  . Hypertension   . Depression   . Osteopenia   . Hx of adenomatous colonic polyps   . Osteoarthritis   . History of gastroesophageal reflux (GERD)   . Hx of cardiac cath 2006    neg  . Aortic stenosis     ECHO 5-20ll mild AS but no evidence of HCM or MR--no further testing suggested    Past Surgical History  Procedure Laterality Date  . Cholecystectomy    . Abdominal hysterectomy    . Oophorectomy    . Appendectomy      (at time of hyst?)  . Bladder surgery    . Rotator cuff repair      right   . Toe surgery      R 2nd --- correction of claw toe        Review of Systems No chest pain or shortness or breath. No lower extremity edema. No  nausea, vomiting, diarrhea or blood in the stools.     Objective:   Physical Exam BP 130/84  Pulse 74  Temp(Src) 98.3 F (36.8 C) (Oral)  Wt 198 lb (89.812 kg)  BMI 31 kg/m2  SpO2 96%  General -- alert, well-developed, NAD .   Neck --no thyromegaly , normal carotid pulse, no LADs Lungs -- normal respiratory effort, no intercostal retractions, no accessory muscle use, and normal breath sounds.   Heart-- + Systolic murmur, II/VI   Extremities-- no pretibial edema bilaterally  Neurologic-- alert & oriented X3 and strength normal in all extremities. Psych-- Cognition and judgment appear intact. Alert and cooperative with normal attention span and concentration.  not anxious appearing and not depressed appearing.      Assessment & Plan:

## 2013-02-13 NOTE — Assessment & Plan Note (Signed)
Although I'm not doing a yearly checkup today, I recommended to schedule a mammogram, she is due. She sees gynecology routinely.

## 2013-02-13 NOTE — Assessment & Plan Note (Signed)
Well-controlled, no change 

## 2013-02-13 NOTE — Assessment & Plan Note (Signed)
Currently on vesicare, symptoms well-controlled

## 2013-02-13 NOTE — Assessment & Plan Note (Signed)
DEXA from 04-2010, osteopenia worse with more sites w/ T scores of -1.8  Good compliance with calcium and vitamin D.  Normal vitamin D April 2014. Plan: Check a bone density test on return to the office

## 2013-02-13 NOTE — Patient Instructions (Addendum)
Please come back in 6 months for a yearly checkup

## 2013-02-15 ENCOUNTER — Encounter: Payer: Self-pay | Admitting: *Deleted

## 2013-04-25 ENCOUNTER — Telehealth: Payer: Self-pay | Admitting: *Deleted

## 2013-04-25 NOTE — Telephone Encounter (Signed)
Left message for patient to call pharmacy and have them send over the Rx refills..[id number]///[id number]

## 2013-05-05 ENCOUNTER — Other Ambulatory Visit: Payer: Self-pay

## 2013-05-05 ENCOUNTER — Telehealth: Payer: Self-pay | Admitting: Internal Medicine

## 2013-05-05 MED ORDER — SIMVASTATIN 20 MG PO TABS: 20.0000 mg | ORAL_TABLET | Freq: Every day | ORAL | Status: DC

## 2013-05-05 MED ORDER — NIFEDIPINE ER OSMOTIC RELEASE 60 MG PO TB24: ORAL_TABLET | ORAL | Status: DC

## 2013-05-05 MED ORDER — VENLAFAXINE HCL ER 37.5 MG PO CP24: ORAL_CAPSULE | ORAL | Status: DC

## 2013-05-05 NOTE — Telephone Encounter (Signed)
05/05/2013  Pt came in for medication refills.  (1) Nifedical XL,   (2) Zocor, and (3) Effexor-XR.   Please send to [facility] on [address].  bw

## 2013-05-05 NOTE — Telephone Encounter (Signed)
Prescriptions sent to pharmacy

## 2013-06-06 ENCOUNTER — Ambulatory Visit (INDEPENDENT_AMBULATORY_CARE_PROVIDER_SITE_OTHER): Payer: BC Managed Care – PPO | Admitting: *Deleted

## 2013-06-06 DIAGNOSIS — Z23 Encounter for immunization: Secondary | ICD-10-CM

## 2013-08-16 ENCOUNTER — Ambulatory Visit (INDEPENDENT_AMBULATORY_CARE_PROVIDER_SITE_OTHER): Payer: BC Managed Care – PPO | Admitting: Internal Medicine

## 2013-08-16 ENCOUNTER — Encounter: Payer: Self-pay | Admitting: Internal Medicine

## 2013-08-16 VITALS — BP 133/80 | HR 85 | Temp 98.1°F | Wt 202.0 lb

## 2013-08-16 DIAGNOSIS — E785 Hyperlipidemia, unspecified: Secondary | ICD-10-CM

## 2013-08-16 DIAGNOSIS — N318 Other neuromuscular dysfunction of bladder: Secondary | ICD-10-CM

## 2013-08-16 DIAGNOSIS — I10 Essential (primary) hypertension: Secondary | ICD-10-CM

## 2013-08-16 DIAGNOSIS — N3281 Overactive bladder: Secondary | ICD-10-CM

## 2013-08-16 DIAGNOSIS — F329 Major depressive disorder, single episode, unspecified: Secondary | ICD-10-CM

## 2013-08-16 DIAGNOSIS — M899 Disorder of bone, unspecified: Secondary | ICD-10-CM

## 2013-08-16 MED ORDER — SOLIFENACIN SUCCINATE 10 MG PO TABS: 10.0000 mg | ORAL_TABLET | Freq: Every day | ORAL | Status: DC

## 2013-08-16 NOTE — Assessment & Plan Note (Signed)
Well-controlled, no change 

## 2013-08-16 NOTE — Assessment & Plan Note (Signed)
Excellent BP today, no change

## 2013-08-16 NOTE — Progress Notes (Signed)
Pre visit review using our clinic review tool, if applicable. No additional management support is needed unless otherwise documented below in the visit note. 

## 2013-08-16 NOTE — Assessment & Plan Note (Addendum)
Refer to a new urologist, see history of present illness. Samples and a prescription for VESIcare provided

## 2013-08-16 NOTE — Assessment & Plan Note (Signed)
Order a DEXA

## 2013-08-16 NOTE — Progress Notes (Signed)
   Subjective:    Patient ID: [name], female    DOB: [birth year], 77 y.o.   MRN: [id number]  HPI ROV Here for evaluation of hypertension, depression, over active bladder, high cholesterol. Medications list reviewed, good compliance, no apparent side effects Mood  very well controlled on Effexor. Needs a referral to a new urologist, used to see Dr. Lindley Magnus but his new insurance won't accept him, also [facility]  will become very expensive. All recent labs reviewed, not due for labs at this time   Past Medical History  Diagnosis Date  . Hypertension   . Depression   . Osteopenia   . Hx of adenomatous colonic polyps   . Osteoarthritis   . History of gastroesophageal reflux (GERD)   . Hx of cardiac cath 2006    neg  . Aortic stenosis     ECHO 5-20ll mild AS but no evidence of HCM or MR--no further testing suggested   . OAB (overactive bladder) 07/13/2012   Past Surgical History  Procedure Laterality Date  . Cholecystectomy    . Abdominal hysterectomy    . Oophorectomy    . Appendectomy      (at time of hyst?)  . Bladder surgery    . Rotator cuff repair      right   . Toe surgery      R 2nd --- correction of claw toe        History   Social History  . Marital Status: Married    Spouse Name: N/A    Number of Children: 2  . Years of Education: N/A   Occupational History  . stays at home     Social History Main Topics  . Smoking status: Never Smoker   . Smokeless tobacco: Never Used  . Alcohol Use: No     Comment: wine rarely  . Drug Use: No  . Sexual Activity: Not on file   Other Topics Concern  . Not on file   Social History Narrative        Family History: colon ca--no breast ca--no her identical twin dx ovarian Ca, patient saw her gyn, they did  a test  (?CA 125) and it came back ok -- DECEASED 08/21/2009 MI--no CAD-- F had CABG at 77y/o GB cancer -- F     Review of Systems Denies chest pain, difficulty breathing. No nausea, vomiting,  diarrhea.     Objective:   Physical Exam  BP 133/80  Pulse 85  Temp(Src) 98.1 F (36.7 C)  Wt 202 lb (91.627 kg)  SpO2 97% General -- alert, well-developed, NAD.  Lungs -- normal respiratory effort, no intercostal retractions, no accessory muscle use, and normal breath sounds.  Heart-- normal rate, regular rhythm, no murmur.  Extremities-- no pretibial edema bilaterally  Neurologic--  alert & oriented X3. Speech normal, gait normal, strength normal in all extremities.  Psych-- Cognition and judgment appear intact. Cooperative with normal attention span and concentration. No anxious appearing , no depressed appearing.     Assessment & Plan:

## 2013-08-16 NOTE — Patient Instructions (Signed)
Next visit for a physical exam , fasting, in 6 months  Please make an appointment

## 2013-08-21 ENCOUNTER — Ambulatory Visit (INDEPENDENT_AMBULATORY_CARE_PROVIDER_SITE_OTHER)
Admission: RE | Admit: 2013-08-21 | Discharge: 2013-08-21 | Disposition: A | Discharge status: Home / Self Care | Payer: BC Managed Care – PPO | Source: Ambulatory Visit | Attending: Internal Medicine | Admitting: Internal Medicine

## 2013-08-21 DIAGNOSIS — M899 Disorder of bone, unspecified: Secondary | ICD-10-CM

## 2013-08-24 ENCOUNTER — Telehealth: Payer: Self-pay | Admitting: *Deleted

## 2013-08-24 MED ORDER — ALENDRONATE SODIUM 70 MG PO TABS: 70.0000 mg | ORAL_TABLET | ORAL | Status: DC

## 2013-08-24 NOTE — Telephone Encounter (Signed)
Rx sent, discuss the following precautions with patient: Take once a week, immediately after she wakes up with 2 glasses of water, needs to stay in the upright position for 35-45 minutes, do not eat or go back into bed Call if side effects for instance chest pain after taking Fosamax

## 2013-08-24 NOTE — Telephone Encounter (Signed)
Pt would like to start Fosamax. Please advise . DJR

## 2013-08-24 NOTE — Addendum Note (Signed)
Addended by: Willow Ora E on: 08/24/2013 01:23 PM   Modules accepted: Orders

## 2013-08-24 NOTE — Telephone Encounter (Signed)
Message copied by Eustace Quail on Thu Aug 24, 2013  8:56 AM ------      Message from: [name]      Created: Wed Aug 23, 2013  5:30 PM       Advise patient, has osteopenia, only slightly worse than previous study .      Recommend daily calcium, vitamin D and exercise; if she like to start a medication such as Fosamax weekly I would be in agreement. If she prefers to wait and discuss with me at the next visit that is okay. ------

## 2013-08-24 NOTE — Telephone Encounter (Signed)
Done. DJR  

## 2013-11-07 ENCOUNTER — Other Ambulatory Visit: Payer: Self-pay | Admitting: Internal Medicine

## 2013-12-12 ENCOUNTER — Other Ambulatory Visit: Payer: Self-pay | Admitting: Internal Medicine

## 2014-01-15 ENCOUNTER — Encounter: Payer: Self-pay | Admitting: Internal Medicine

## 2014-02-05 ENCOUNTER — Other Ambulatory Visit: Payer: Self-pay | Admitting: Internal Medicine

## 2014-02-13 ENCOUNTER — Telehealth: Payer: Self-pay

## 2014-02-13 NOTE — Telephone Encounter (Signed)
Left message for call back Non identifiable  Flu vaccine-05/2013 Tdap--2008 PNA--2005, 2010 Shingles vaccine--2012 Pap--sees gyn [facility]--due 2013 MMG--at gyn [facility]--08/2013-- T score -2.1

## 2014-02-14 ENCOUNTER — Encounter: Payer: Self-pay | Admitting: Internal Medicine

## 2014-02-14 ENCOUNTER — Ambulatory Visit (INDEPENDENT_AMBULATORY_CARE_PROVIDER_SITE_OTHER): Payer: Medicare Other | Admitting: Internal Medicine

## 2014-02-14 VITALS — BP 114/68 | HR 83 | Temp 97.8°F | Ht 66.0 in | Wt 200.0 lb

## 2014-02-14 DIAGNOSIS — M899 Disorder of bone, unspecified: Secondary | ICD-10-CM

## 2014-02-14 DIAGNOSIS — Z0000 Encounter for general adult medical examination without abnormal findings: Secondary | ICD-10-CM

## 2014-02-14 DIAGNOSIS — F3289 Other specified depressive episodes: Secondary | ICD-10-CM

## 2014-02-14 DIAGNOSIS — E785 Hyperlipidemia, unspecified: Secondary | ICD-10-CM

## 2014-02-14 DIAGNOSIS — I10 Essential (primary) hypertension: Secondary | ICD-10-CM

## 2014-02-14 DIAGNOSIS — I350 Nonrheumatic aortic (valve) stenosis: Secondary | ICD-10-CM

## 2014-02-14 DIAGNOSIS — F329 Major depressive disorder, single episode, unspecified: Secondary | ICD-10-CM

## 2014-02-14 DIAGNOSIS — I359 Nonrheumatic aortic valve disorder, unspecified: Secondary | ICD-10-CM

## 2014-02-14 DIAGNOSIS — M949 Disorder of cartilage, unspecified: Secondary | ICD-10-CM

## 2014-02-14 DIAGNOSIS — Z23 Encounter for immunization: Secondary | ICD-10-CM

## 2014-02-14 LAB — BASIC METABOLIC PANEL
BUN: 17 mg/dL (ref 6–23)
CHLORIDE: 104 meq/L (ref 96–112)
CO2: 28 meq/L (ref 19–32)
Calcium: 9.4 mg/dL (ref 8.4–10.5)
Creatinine, Ser: 0.8 mg/dL (ref 0.4–1.2)
GFR: 73.08 mL/min (ref >60.00)
GLUCOSE: 90 mg/dL (ref 70–99)
POTASSIUM: 3.5 meq/L (ref 3.5–5.1)
SODIUM: 139 meq/L (ref 135–145)

## 2014-02-14 LAB — CBC WITH DIFFERENTIAL/PLATELET
Basophils Absolute: 0 10*3/uL (ref 0.0–0.1)
Basophils Relative: 0.7 % (ref 0.0–3.0)
EOS ABS: 0.2 10*3/uL (ref 0.0–0.7)
EOS PCT: 3.7 % (ref 0.0–5.0)
HCT: 38.1 % (ref 36.0–46.0)
HEMOGLOBIN: 12.8 g/dL (ref 12.0–15.0)
Lymphocytes Relative: 24.7 % (ref 12.0–46.0)
Lymphs Abs: 1.4 10*3/uL (ref 0.7–4.0)
MCHC: 33.6 g/dL (ref 30.0–36.0)
MCV: 89.6 fl (ref 78.0–100.0)
MONO ABS: 0.6 10*3/uL (ref 0.1–1.0)
Monocytes Relative: 10 % (ref 3.0–12.0)
NEUTROS ABS: 3.4 10*3/uL (ref 1.4–7.7)
NEUTROS PCT: 60.9 % (ref 43.0–77.0)
Platelets: 218 10*3/uL (ref 150.0–400.0)
RBC: 4.26 Mil/uL (ref 3.87–5.11)
RDW: 12.5 % (ref 11.5–15.5)
WBC: 5.6 10*3/uL (ref 4.0–10.5)

## 2014-02-14 LAB — LIPID PANEL
CHOLESTEROL: 156 mg/dL (ref 0–200)
HDL: 47 mg/dL (ref >39.00)
LDL CALC: 82 mg/dL (ref 0–99)
NonHDL: 109
TRIGLYCERIDES: 137 mg/dL (ref 0.0–149.0)
Total CHOL/HDL Ratio: 3
VLDL: 27.4 mg/dL (ref 0.0–40.0)

## 2014-02-14 LAB — AST: AST: 23 U/L (ref 0–37)

## 2014-02-14 LAB — TSH: TSH: 1.99 u[IU]/mL (ref 0.35–4.50)

## 2014-02-14 LAB — ALT: ALT: 22 U/L (ref 0–35)

## 2014-02-14 MED ORDER — NIFEDIPINE ER 60 MG PO TB24: ORAL_TABLET | ORAL | Status: DC

## 2014-02-14 MED ORDER — SIMVASTATIN 20 MG PO TABS: ORAL_TABLET | ORAL | Status: DC

## 2014-02-14 MED ORDER — VENLAFAXINE HCL ER 37.5 MG PO CP24: ORAL_CAPSULE | ORAL | Status: DC

## 2014-02-14 MED ORDER — ALENDRONATE SODIUM 70 MG PO TABS: 70.0000 mg | ORAL_TABLET | ORAL | Status: DC

## 2014-02-14 MED ORDER — OMEPRAZOLE 20 MG PO CPDR: DELAYED_RELEASE_CAPSULE | ORAL | Status: DC

## 2014-02-14 NOTE — Assessment & Plan Note (Signed)
Due for labs, good compliance with medications

## 2014-02-14 NOTE — Assessment & Plan Note (Signed)
Bone density test 08/2013 T score -2.1, started Fosamax, good compliance, no apparent side effects. On calcium and vitamin D OTC, previous vitamin D   normal. Plan: Bone density test by late 2016

## 2014-02-14 NOTE — Assessment & Plan Note (Addendum)
Td 2008  pneumonia shot 2005 and 2010  prevnar-- today Shingles immunization done @ [facility]  Used to see gyn, h/o total hyst, no h/o abnormal paps, no further PAPs Reports had a MMG 2014, no report in the chart, was told MMG (-).  Plan-- discuss breast exam and mmg next year last colonoscopy 01/2009, due for one, refer to Dr Carlean Purl    Diet and exercise discussed

## 2014-02-14 NOTE — Patient Instructions (Signed)
Get your blood work before you leave   Check the  blood pressure 2  times a month  be sure it is between 110/60 and 140/85. Ideal blood pressure is 120/80. If it is consistently higher or lower, let me know  Next visit is for a physical exam in 1 year,  fasting Please make an appointment        Fall Prevention and Home Safety Falls cause injuries and can affect all age groups. It is possible to use preventive measures to significantly decrease the likelihood of falls. There are many simple measures which can make your home safer and prevent falls. OUTDOORS  Repair cracks and edges of walkways and driveways.  Remove high doorway thresholds.  Trim shrubbery on the main path into your home.  Have good outside lighting.  Clear walkways of tools, rocks, debris, and clutter.  Check that handrails are not broken and are securely fastened. Both sides of steps should have handrails.  Have leaves, snow, and ice cleared regularly.  Use sand or salt on walkways during winter months.  In the garage, clean up grease or oil spills. BATHROOM  Install night lights.  Install grab bars by the toilet and in the tub and shower.  Use non-skid mats or decals in the tub or shower.  Place a plastic non-slip stool in the shower to sit on, if needed.  Keep floors dry and clean up all water on the floor immediately.  Remove soap buildup in the tub or shower on a regular basis.  Secure bath mats with non-slip, double-sided rug tape.  Remove throw rugs and tripping hazards from the floors. BEDROOMS  Install night lights.  Make sure a bedside light is easy to reach.  Do not use oversized bedding.  Keep a telephone by your bedside.  Have a firm chair with side arms to use for getting dressed.  Remove throw rugs and tripping hazards from the floor. KITCHEN  Keep handles on pots and pans turned toward the center of the stove. Use back burners when possible.  Clean up spills quickly  and allow time for drying.  Avoid walking on wet floors.  Avoid hot utensils and knives.  Position shelves so they are not too high or low.  Place commonly used objects within easy reach.  If necessary, use a sturdy step stool with a grab bar when reaching.  Keep electrical cables out of the way.  Do not use floor polish or wax that makes floors slippery. If you must use wax, use non-skid floor wax.  Remove throw rugs and tripping hazards from the floor. STAIRWAYS  Never leave objects on stairs.  Place handrails on both sides of stairways and use them. Fix any loose handrails. Make sure handrails on both sides of the stairways are as long as the stairs.  Check carpeting to make sure it is firmly attached along stairs. Make repairs to worn or loose carpet promptly.  Avoid placing throw rugs at the top or bottom of stairways, or properly secure the rug with carpet tape to prevent slippage. Get rid of throw rugs, if possible.  Have an electrician put in a light switch at the top and bottom of the stairs. OTHER FALL PREVENTION TIPS  Wear low-heel or rubber-soled shoes that are supportive and fit well. Wear closed toe shoes.  When using a stepladder, make sure it is fully opened and both spreaders are firmly locked. Do not climb a closed stepladder.  Add color or contrast  paint or tape to grab bars and handrails in your home. Place contrasting color strips on first and last steps.  Learn and use mobility aids as needed. Install an electrical emergency response system.  Turn on lights to avoid dark areas. Replace light bulbs that burn out immediately. Get light switches that glow.  Arrange furniture to create clear pathways. Keep furniture in the same place.  Firmly attach carpet with non-skid or double-sided tape.  Eliminate uneven floor surfaces.  Select a carpet pattern that does not visually hide the edge of steps.  Be aware of all pets. OTHER HOME SAFETY TIPS  Set  the water temperature for 120 F (48.8 C).  Keep emergency numbers on or near the telephone.  Keep smoke detectors on every level of the home and near sleeping areas. Document Released: 08/21/2002 Document Revised: 03/01/2012 Document Reviewed: 11/20/2011 [facility] Patient Information 2014 [place].

## 2014-02-14 NOTE — Progress Notes (Signed)
Pre visit review using our clinic review tool, if applicable. No additional management support is needed unless otherwise documented below in the visit note. 

## 2014-02-14 NOTE — Assessment & Plan Note (Signed)
Asymptomatic, will check a echocardiogram

## 2014-02-14 NOTE — Assessment & Plan Note (Signed)
Well-controlled, no change, labs

## 2014-02-14 NOTE — Progress Notes (Signed)
Subjective:    Patient ID: [name], female    DOB: 30-Sep-1935, 78 y.o.   MRN: [id number]  DOS:  02/14/2014 Type of  Visit:   Here for Medicare AWV: 1. Risk factors based on Past M, S, F history: reviewed 2. Physical Activities:  Home chores, some yard work, no routine exercise   3. Depression/mood: Neg screen  4. Hearing: No problemss noted or reported   5. ADL's:  Completely independent   6. Fall Risk: no recent falls, counseled about prevention 7. home Safety: does feel safe at home   8. Height, weight, &visual acuity: see VS, uses glasses, see eye doctor routinely   9. Counseling: provided 10. Labs ordered based on risk factors: if needed   11. Referral Coordination: if needed 12.  Care Plan, see assessment and plan   13.   Cognitive Assessment: motor skills and cognition wnl  In addition, today we discussed the following: Osteopenia, on Fosamax, good compliance, no apparent side effects Depression, on Effexor, symptoms well-controlled High cholesterol, good compliance with medication. Hypertension, checks her BP from time to time and  gets good readings    ROS No  CP, SOB Denies  nausea, vomiting diarrhea, blood in the stools No abdominal pain (-) cough, sputum production (-) wheezing, chest congestion No vaginal discharge, bleed or genital rash .    Past Medical History  Diagnosis Date  . Hypertension   . Depression   . Osteopenia     rx fosamax 08-2013  . Hx of adenomatous colonic polyps   . Osteoarthritis   . History of gastroesophageal reflux (GERD)   . Hx of cardiac cath 2006    neg  . Aortic stenosis     ECHO 5-20ll mild AS but no evidence of HCM or MR--no further testing suggested   . OAB (overactive bladder) 07/13/2012    Past Surgical History  Procedure Laterality Date  . Cholecystectomy    . Abdominal hysterectomy    . Oophorectomy    . Appendectomy      (at time of hyst?)  . Bladder surgery    . Rotator cuff repair      right   . Toe  surgery      R 2nd --- correction of claw toe     History   Social History  . Marital Status: Married    Spouse Name: N/A    Number of Children: 2  . Years of Education: N/A   Occupational History  . stays at home     Social History Main Topics  . Smoking status: Never Smoker   . Smokeless tobacco: Never Used  . Alcohol Use: No     Comment: wine rarely  . Drug Use: No  . Sexual Activity: Not on file   Other Topics Concern  . Not on file   Social History Narrative         Family History  Problem Relation Age of Onset  . Coronary artery disease Father 86    had CABG  . Cancer Father     gallbladder  . Stroke Mother 75  . Ovarian cancer Sister 18  . Colon cancer Neg Hx     NEGATIVE  . Breast cancer Neg Hx     NEGATIVE       Medication List       This list is accurate as of: 02/14/14  5:45 PM.  Always use your most recent med list.  alendronate 70 MG tablet  Commonly known as:  FOSAMAX  Take 1 tablet (70 mg total) by mouth every 7 (seven) days. Take with a full glass of water on an empty stomach.     aspirin 81 MG tablet  Take 81 mg by mouth daily.     B-complex with vitamin C tablet  Take 1 tablet by mouth daily.     Cholecalciferol 1000 UNITS capsule  Take 1,000 Units by mouth daily.     Cinnamon 500 MG capsule  Take 500 mg by mouth daily.     FISH OIL PO  Take by mouth.     MYRBETRIQ 25 MG Tb24 tablet  Generic drug:  mirabegron ER  Take 25 mg by mouth daily.     NIFEdipine 60 MG 24 hr tablet  Commonly known as:  PROCARDIA-XL/ADALAT CC  TAKE ONE  BY MOUTH ONCE DAILY     OCUVITE PO  Take by mouth.     omeprazole 20 MG capsule  Commonly known as:  PRILOSEC  TAKE ONE CAPSULE BY MOUTH TWICE DAILY ON EMPTY STOMACH     simvastatin 20 MG tablet  Commonly known as:  ZOCOR  TAKE ONE TABLET BY MOUTH AT BEDTIME     venlafaxine XR 37.5 MG 24 hr capsule  Commonly known as:  EFFEXOR-XR  TAKE ONE CAPSULE BY MOUTH ONCE DAILY       vitamin E 600 UNIT capsule  Take 600 Units by mouth daily.           Objective:   Physical Exam BP 114/68  Pulse 83  Temp(Src) 97.8 F (36.6 C)  Ht 5\' 6"  (1.676 m)  Wt 200 lb (90.719 kg)  BMI 32.30 kg/m2  SpO2 96%  General -- alert, well-developed, NAD.  Neck --no thyromegaly , normal carotid pulse  HEENT-- Not pale.  Lungs -- normal respiratory effort, no intercostal retractions, no accessory muscle use, and normal breath sounds.  Heart-- normal rate, regular rhythm, + syst  murmur.  Abdomen-- Not distended, good bowel sounds,soft, non-tender.  Extremities-- no pretibial edema bilaterally  Neurologic--  alert & oriented X3. Speech normal, gait appropriate for age, strength symmetric and appropriate for age.  Psych-- Cognition and judgment appear intact. Cooperative with normal attention span and concentration. No anxious or depressed appearing.        Assessment & Plan:

## 2014-02-14 NOTE — Assessment & Plan Note (Signed)
Well-controlled,No change

## 2014-02-14 NOTE — Telephone Encounter (Signed)
Unable to reach prior to visit  

## 2014-02-15 ENCOUNTER — Encounter: Payer: Self-pay | Admitting: *Deleted

## 2014-02-15 ENCOUNTER — Telehealth: Payer: Self-pay | Admitting: Internal Medicine

## 2014-02-15 NOTE — Progress Notes (Signed)
Letter sent.

## 2014-02-15 NOTE — Telephone Encounter (Signed)
Relevant patient education mailed to patient.  

## 2014-02-16 ENCOUNTER — Encounter: Payer: Self-pay | Admitting: Internal Medicine

## 2014-03-07 ENCOUNTER — Ambulatory Visit (HOSPITAL_BASED_OUTPATIENT_CLINIC_OR_DEPARTMENT_OTHER)
Admission: RE | Admit: 2014-03-07 | Discharge: 2014-03-07 | Disposition: A | Discharge status: Home / Self Care | Payer: Medicare Other | Source: Ambulatory Visit | Attending: Internal Medicine | Admitting: Internal Medicine

## 2014-03-07 DIAGNOSIS — I359 Nonrheumatic aortic valve disorder, unspecified: Secondary | ICD-10-CM | POA: Insufficient documentation

## 2014-03-07 DIAGNOSIS — I350 Nonrheumatic aortic (valve) stenosis: Secondary | ICD-10-CM

## 2014-03-07 DIAGNOSIS — I519 Heart disease, unspecified: Secondary | ICD-10-CM | POA: Diagnosis not present

## 2014-03-07 DIAGNOSIS — I10 Essential (primary) hypertension: Secondary | ICD-10-CM

## 2014-03-07 DIAGNOSIS — E785 Hyperlipidemia, unspecified: Secondary | ICD-10-CM | POA: Insufficient documentation

## 2014-03-07 DIAGNOSIS — I5189 Other ill-defined heart diseases: Secondary | ICD-10-CM | POA: Diagnosis not present

## 2014-03-07 NOTE — Progress Notes (Signed)
Echocardiogram 2D Echocardiogram has been performed.  Joelene Millin 03/07/2014, 10:15 AM

## 2014-03-09 ENCOUNTER — Encounter: Payer: Self-pay | Admitting: *Deleted

## 2014-04-05 ENCOUNTER — Ambulatory Visit (AMBULATORY_SURGERY_CENTER): Payer: Self-pay | Admitting: *Deleted

## 2014-04-05 VITALS — Ht 67.0 in | Wt 202.2 lb

## 2014-04-05 DIAGNOSIS — Z86010 Personal history of colonic polyps: Secondary | ICD-10-CM

## 2014-04-05 MED ORDER — NA SULFATE-K SULFATE-MG SULF 17.5-3.13-1.6 GM/177ML PO SOLN: 1.0000 | Freq: Once | ORAL | Status: DC

## 2014-04-05 NOTE — Progress Notes (Signed)
No allergies to eggs or soy. No problems with anesthesia.  Pt given Emmi instructions for colonoscopy  No oxygen use  No diet drug use  

## 2014-04-13 ENCOUNTER — Encounter: Payer: Self-pay | Admitting: Internal Medicine

## 2014-04-19 ENCOUNTER — Encounter: Payer: Self-pay | Admitting: Internal Medicine

## 2014-04-19 ENCOUNTER — Ambulatory Visit (AMBULATORY_SURGERY_CENTER): Payer: Medicare Other | Admitting: Internal Medicine

## 2014-04-19 VITALS — BP 142/74 | HR 70 | Temp 96.2°F | Resp 20 | Ht 67.0 in | Wt 202.0 lb

## 2014-04-19 DIAGNOSIS — Z860100 Personal history of colon polyps, unspecified: Secondary | ICD-10-CM

## 2014-04-19 DIAGNOSIS — D126 Benign neoplasm of colon, unspecified: Secondary | ICD-10-CM

## 2014-04-19 DIAGNOSIS — D120 Benign neoplasm of cecum: Secondary | ICD-10-CM

## 2014-04-19 HISTORY — DX: Personal history of colon polyps, unspecified: Z86.0100

## 2014-04-19 HISTORY — DX: Personal history of colonic polyps: Z86.010

## 2014-04-19 MED ORDER — SODIUM CHLORIDE 0.9 % IV SOLN: 500.0000 mL | INTRAVENOUS | Status: DC

## 2014-04-19 NOTE — Progress Notes (Signed)
Called to room to assist during endoscopic procedure.  Patient ID and intended procedure confirmed with present staff. Received instructions for my participation in the procedure from the performing physician.  

## 2014-04-19 NOTE — Op Note (Signed)
[place]  [address] [place], [id number]   COLONOSCOPY PROCEDURE REPORT  PATIENT: [name], [name]  MR#: [id number] BIRTHDATE: [birth year] , 60  yrs. old GENDER: Female ENDOSCOPIST: Gatha Mayer, MD, [facility] PROCEDURE DATE:  04/19/2014 PROCEDURE:   Colonoscopy with biopsy and snare polypectomy First Screening Colonoscopy - Avg.  risk and is 50 yrs.  old or older - No.  Prior Negative Screening - Now for repeat screening. N/A  History of Adenoma - Now for follow-up colonoscopy & has been > or = to 3 yrs.  Yes hx of adenoma.  Has been 3 or more years since last colonoscopy.  Polyps Removed Today? Yes. ASA CLASS:   Class II INDICATIONS:Patient's personal history of adenomatous colon polyps.  MEDICATIONS: propofol (Diprivan) 200mg  IV, MAC sedation, administered by CRNA, and These medications were titrated to patient response per physician's verbal order  DESCRIPTION OF PROCEDURE:   After the risks benefits and alternatives of the procedure were thoroughly explained, informed consent was obtained.  A digital rectal exam revealed no abnormalities of the rectum.   The LB [id number]  endoscope was introduced through the anus and advanced to the cecum, which was identified by both the appendix and ileocecal valve. No adverse events experienced.   The quality of the prep was Suprep adequate The instrument was then slowly withdrawn as the colon was fully examined.  COLON FINDINGS: Two sessile polyps measuring 2 and 4 mm in size were found at the cecum.  A polypectomy was performed with cold forceps and with a cold snare.  The resection was complete and the polyp tissue was completely retrieved.   The colon mucosa was otherwise normal.  Retroflexed views revealed internal hemorrhoids. The time to cecum=2 minutes 58 seconds.  Withdrawal time=10 minutes 33 seconds.  The scope was withdrawn and the procedure completed. COMPLICATIONS: There were no  complications.  ENDOSCOPIC IMPRESSION: 1.   Two sessile polyps measuring 2 and 4 mm in size were found at the cecum; polypectomy was performed with cold forceps and with a cold snare 2.   The colon mucosa was otherwise normal - adequate prep - hx adenomas last in 2010  RECOMMENDATIONS: 1.  Await pathology results 2.   Unlikely to need another routine colonoscopy   eSigned:  Gatha Mayer, MD, [facility] 04/19/2014 11:43 AM   cc: The Patient

## 2014-04-19 NOTE — Patient Instructions (Addendum)
I found and removed 2 tiny polyps that look nbenign. I will send a letter about results.  I appreciate the opportunity to care for you. Gatha Mayer, MD, FACG  YOU HAD AN ENDOSCOPIC PROCEDURE TODAY AT [facility]: Refer to the procedure report that was given to you for any specific questions about what was found during the examination.  If the procedure report does not answer your questions, please call your gastroenterologist to clarify.  If you requested that your care partner not be given the details of your procedure findings, then the procedure report has been included in a sealed envelope for you to review at your convenience later.  YOU SHOULD EXPECT: Some feelings of bloating in the abdomen. Passage of more gas than usual.  Walking can help get rid of the air that was put into your GI tract during the procedure and reduce the bloating. If you had a lower endoscopy (such as a colonoscopy or flexible sigmoidoscopy) you may notice spotting of blood in your stool or on the toilet paper. If you underwent a bowel prep for your procedure, then you may not have a normal bowel movement for a few days.  DIET: Your first meal following the procedure should be a light meal and then it is ok to progress to your normal diet.  A half-sandwich or bowl of soup is an example of a good first meal.  Heavy or fried foods are harder to digest and may make you feel nauseous or bloated.  Likewise meals heavy in dairy and vegetables can cause extra gas to form and this can also increase the bloating.  Drink plenty of fluids but you should avoid alcoholic beverages for 24 hours.  ACTIVITY: Your care partner should take you home directly after the procedure.  You should plan to take it easy, moving slowly for the rest of the day.  You can resume normal activity the day after the procedure however you should NOT DRIVE or use heavy machinery for 24 hours (because of the sedation medicines used during the  test).    SYMPTOMS TO REPORT IMMEDIATELY: A gastroenterologist can be reached at any hour.  During normal business hours, 8:30 AM to 5:00 PM Monday through Friday, call [phone].  After hours and on weekends, please call the GI answering service at [phone] who will take a message and have the physician on call contact you.   Following lower endoscopy (colonoscopy or flexible sigmoidoscopy):  Excessive amounts of blood in the stool  Significant tenderness or worsening of abdominal pains  Swelling of the abdomen that is new, acute  Fever of 100F or higher    FOLLOW UP: If any biopsies were taken you will be contacted by phone or by letter within the next 1-3 weeks.  Call your gastroenterologist if you have not heard about the biopsies in 3 weeks.  Our staff will call the home number listed on your records the next business day following your procedure to check on you and address any questions or concerns that you may have at that time regarding the information given to you following your procedure. This is a courtesy call and so if there is no answer at the home number and we have not heard from you through the emergency physician on call, we will assume that you have returned to your regular daily activities without incident.  SIGNATURES/CONFIDENTIALITY: You and/or your care partner have signed paperwork which will be entered into your  electronic medical record.  These signatures attest to the fact that that the information above on your After Visit Summary has been reviewed and is understood.  Full responsibility of the confidentiality of this discharge information lies with you and/or your care-partner.  Polyp information given.

## 2014-04-19 NOTE — Progress Notes (Signed)
Stable to RR 

## 2014-04-20 ENCOUNTER — Telehealth: Payer: Self-pay | Admitting: *Deleted

## 2014-04-20 NOTE — Telephone Encounter (Signed)
  Follow up Call-  Call back number 04/19/2014  Post procedure Call Back phone  # [phone]  Permission to leave phone message Yes     Patient questions:  Do you have a fever, pain , or abdominal swelling? No. Pain Score  0 *  Have you tolerated food without any problems? Yes.    Have you been able to return to your normal activities? Yes.    Do you have any questions about your discharge instructions: Diet   No. Medications  No. Follow up visit  No.  Do you have questions or concerns about your Care? No.  Actions: * If pain score is 4 or above: No action needed, pain <4.

## 2014-04-26 ENCOUNTER — Encounter: Payer: Self-pay | Admitting: Internal Medicine

## 2014-04-26 NOTE — Progress Notes (Signed)
Quick Note:  Diminutive ssp - repeat colon 2020 ______

## 2014-04-26 NOTE — Progress Notes (Signed)
Quick Note:  correction no colon recall due to age and findings ______

## 2014-06-11 ENCOUNTER — Ambulatory Visit (INDEPENDENT_AMBULATORY_CARE_PROVIDER_SITE_OTHER): Payer: Medicare Other

## 2014-06-11 DIAGNOSIS — Z23 Encounter for immunization: Secondary | ICD-10-CM

## 2014-07-27 ENCOUNTER — Other Ambulatory Visit: Payer: Self-pay

## 2014-07-27 MED ORDER — OMEPRAZOLE 20 MG PO CPDR: DELAYED_RELEASE_CAPSULE | ORAL | Status: DC

## 2014-07-30 ENCOUNTER — Other Ambulatory Visit: Payer: Self-pay

## 2014-07-30 MED ORDER — OMEPRAZOLE 20 MG PO CPDR: DELAYED_RELEASE_CAPSULE | ORAL | Status: DC

## 2014-11-12 ENCOUNTER — Ambulatory Visit (INDEPENDENT_AMBULATORY_CARE_PROVIDER_SITE_OTHER): Payer: Medicare Other | Admitting: Internal Medicine

## 2014-11-12 ENCOUNTER — Encounter: Payer: Self-pay | Admitting: Internal Medicine

## 2014-11-12 VITALS — BP 122/74 | HR 96 | Temp 98.2°F | Wt 204.2 lb

## 2014-11-12 DIAGNOSIS — R059 Cough, unspecified: Secondary | ICD-10-CM

## 2014-11-12 DIAGNOSIS — R05 Cough: Secondary | ICD-10-CM | POA: Diagnosis not present

## 2014-11-12 NOTE — Patient Instructions (Addendum)
Get a humidifier and use it at night  NyQuil for bedtime  Mucinex DM in one tablet during the daytime as needed for cough  Call if not  back to normal in 10 days. Antibiotics?

## 2014-11-12 NOTE — Progress Notes (Signed)
Pre visit review using our clinic review tool, if applicable. No additional management support is needed unless otherwise documented below in the visit note. 

## 2014-11-12 NOTE — Progress Notes (Signed)
Subjective:    Patient ID: [name], female    DOB: [birth year], 79 y.o.   MRN: [id number]  DOS:  11/12/2014 Type of visit - description : acute  Interval history: Had a mild cold 2 weeks ago, since then is better except for dry cough, day or night. NyQuil at bedtime helps, otherwise she thinks she couldn't sleep due to the cough. Family is concerned about pneumonia   Review of Systems  Denies fever chills. Mild runny nose, occasional sore throat, no sinus pain or congestion No GERD symptoms. No wheezing or chest congestion  Past Medical History  Diagnosis Date  . Hypertension   . Depression   . Osteopenia     rx fosamax 08-2013  . Hx of adenomatous colonic polyps   . Osteoarthritis   . History of gastroesophageal reflux (GERD)   . Hx of cardiac cath 2006    neg  . Aortic stenosis     ECHO 5-20ll mild AS but no evidence of HCM or MR--no further testing suggested   . OAB (overactive bladder) 07/13/2012  . Blood transfusion without reported diagnosis 1971    after hysterectomy  . GERD (gastroesophageal reflux disease)   . Heart murmur   . Personal history of colonic polyps - adenomas 04/19/2014    Past Surgical History  Procedure Laterality Date  . Cholecystectomy    . Abdominal hysterectomy  1971  . Oophorectomy  1971  . Appendectomy  1971    (at time of hyst?)  . Bladder surgery  2003  . Rotator cuff repair Right   . Toe surgery  1995    R 2nd --- correction of claw toe   . Cataract extraction, bilateral      History   Social History  . Marital Status: Married    Spouse Name: N/A  . Number of Children: 2  . Years of Education: N/A   Occupational History  . stays at home     Social History Main Topics  . Smoking status: Never Smoker   . Smokeless tobacco: Never Used  . Alcohol Use: No     Comment: wine rarely  . Drug Use: No  . Sexual Activity: Not on file   Other Topics Concern  . Not on file   Social History Narrative              Medication List       This list is accurate as of: 11/12/14  9:32 PM.  Always use your most recent med list.               alendronate 70 MG tablet  Commonly known as:  FOSAMAX  Take 1 tablet (70 mg total) by mouth every 7 (seven) days. Take with a full glass of water on an empty stomach.     aspirin 81 MG tablet  Take 81 mg by mouth daily.     B-complex with vitamin C tablet  Take 1 tablet by mouth daily.     Cholecalciferol 1000 UNITS capsule  Take 1,000 Units by mouth daily.     Cinnamon 500 MG capsule  Take 500 mg by mouth daily.     FISH OIL PO  Take by mouth.     GERITOL PO  Take by mouth daily.     MYRBETRIQ 25 MG Tb24 tablet  Generic drug:  mirabegron ER  Take 25 mg by mouth daily.     NIFEdipine 60 MG 24 hr tablet  Commonly  known as:  PROCARDIA-XL/ADALAT CC  TAKE ONE  BY MOUTH ONCE DAILY     OCUVITE PO  Take by mouth.     omeprazole 20 MG capsule  Commonly known as:  PRILOSEC  TAKE ONE CAPSULE BY MOUTH TWICE DAILY ON EMPTY STOMACH     simvastatin 20 MG tablet  Commonly known as:  ZOCOR  TAKE ONE TABLET BY MOUTH AT BEDTIME     venlafaxine XR 37.5 MG 24 hr capsule  Commonly known as:  EFFEXOR-XR  TAKE ONE CAPSULE BY MOUTH ONCE DAILY     VITAMIN C PO  Take by mouth daily.     vitamin E 600 UNIT capsule  Take 600 Units by mouth daily.           Objective:   Physical Exam BP 122/74 mmHg  Pulse 96  Temp(Src) 98.2 F (36.8 C) (Oral)  Wt 204 lb 4 oz (92.647 kg)  SpO2 96% General:   Well developed, well nourished . NAD.  HEENT:  Normocephalic . Face symmetric, atraumatic. Sinuses not TTP Tympanic membranes normal, nose not congested, throat without redness. Slightly dry? Lungs:  CTA B Normal respiratory effort, no intercostal retractions, no accessory muscle use. Heart: RRR,  Mild syst  murmur.  Muscle skeletal: no pretibial edema bilaterally  Skin: Not pale. Not jaundice Neurologic:  alert & oriented X3.  Speech normal, gait  appropriate for age and unassisted Psych--  Cognition and judgment appear intact.  Cooperative with normal attention span and concentration.  Behavior appropriate. No anxious or depressed appearing.      Assessment & Plan:    Dry cough, Dry cough for 3 weeks after URI, exam is benign. Conservative treatment with a humidifier, Mucinex DM daytime and NyQuil at night. If not better she will call, antibiotics ? (atypical bronchitis?)

## 2014-11-14 ENCOUNTER — Other Ambulatory Visit: Payer: Self-pay

## 2014-11-14 MED ORDER — NIFEDIPINE ER 60 MG PO TB24: ORAL_TABLET | ORAL | Status: DC

## 2014-11-14 MED ORDER — VENLAFAXINE HCL ER 37.5 MG PO CP24: ORAL_CAPSULE | ORAL | Status: DC

## 2015-01-29 ENCOUNTER — Telehealth: Payer: Self-pay | Admitting: Internal Medicine

## 2015-01-29 NOTE — Telephone Encounter (Signed)
Pre Visit letter sent  °

## 2015-02-18 ENCOUNTER — Telehealth: Payer: Self-pay

## 2015-02-19 ENCOUNTER — Encounter: Payer: Self-pay | Admitting: Internal Medicine

## 2015-02-19 ENCOUNTER — Ambulatory Visit (INDEPENDENT_AMBULATORY_CARE_PROVIDER_SITE_OTHER): Payer: Medicare Other | Admitting: Internal Medicine

## 2015-02-19 VITALS — BP 126/78 | HR 81 | Temp 97.9°F | Ht 67.0 in | Wt 203.1 lb

## 2015-02-19 DIAGNOSIS — Z0000 Encounter for general adult medical examination without abnormal findings: Secondary | ICD-10-CM

## 2015-02-19 DIAGNOSIS — E785 Hyperlipidemia, unspecified: Secondary | ICD-10-CM

## 2015-02-19 DIAGNOSIS — F329 Major depressive disorder, single episode, unspecified: Secondary | ICD-10-CM | POA: Diagnosis not present

## 2015-02-19 DIAGNOSIS — F32A Depression, unspecified: Secondary | ICD-10-CM

## 2015-02-19 DIAGNOSIS — M8580 Other specified disorders of bone density and structure, unspecified site: Secondary | ICD-10-CM | POA: Diagnosis not present

## 2015-02-19 DIAGNOSIS — I10 Essential (primary) hypertension: Secondary | ICD-10-CM

## 2015-02-19 DIAGNOSIS — I350 Nonrheumatic aortic (valve) stenosis: Secondary | ICD-10-CM

## 2015-02-19 LAB — LIPID PANEL
Cholesterol: 229 mg/dL — ABNORMAL HIGH (ref 0–200)
HDL: 49.4 mg/dL (ref >39.00)
LDL CALC: 156 mg/dL — AB (ref 0–99)
NONHDL: 179.6
Total CHOL/HDL Ratio: 5
Triglycerides: 116 mg/dL (ref 0.0–149.0)
VLDL: 23.2 mg/dL (ref 0.0–40.0)

## 2015-02-19 LAB — BASIC METABOLIC PANEL
BUN: 23 mg/dL (ref 6–23)
CALCIUM: 9.9 mg/dL (ref 8.4–10.5)
CO2: 26 mEq/L (ref 19–32)
Chloride: 104 mEq/L (ref 96–112)
Creatinine, Ser: 0.92 mg/dL (ref 0.40–1.20)
GFR: 62.92 mL/min (ref >60.00)
Glucose, Bld: 99 mg/dL (ref 70–99)
POTASSIUM: 3.7 meq/L (ref 3.5–5.1)
Sodium: 138 mEq/L (ref 135–145)

## 2015-02-19 LAB — AST: AST: 21 U/L (ref 0–37)

## 2015-02-19 LAB — ALT: ALT: 20 U/L (ref 0–35)

## 2015-02-19 MED ORDER — OMEPRAZOLE 20 MG PO CPDR: 20.0000 mg | DELAYED_RELEASE_CAPSULE | Freq: Two times a day (BID) | ORAL | Status: DC

## 2015-02-19 MED ORDER — SIMVASTATIN 20 MG PO TABS: 20.0000 mg | ORAL_TABLET | Freq: Every day | ORAL | Status: DC

## 2015-02-19 MED ORDER — ALENDRONATE SODIUM 70 MG PO TABS: 70.0000 mg | ORAL_TABLET | ORAL | Status: DC

## 2015-02-19 NOTE — Patient Instructions (Signed)
Get your blood work before you leave      [place] cause injuries and can affect all age groups. It is possible to use preventive measures to significantly decrease the likelihood of falls. There are many simple measures which can make your home safer and prevent falls. OUTDOORS  Repair cracks and edges of walkways and driveways.  Remove high doorway thresholds.  Trim shrubbery on the main path into your home.  Have good outside lighting.  Clear walkways of tools, rocks, debris, and clutter.  Check that handrails are not broken and are securely fastened. Both sides of steps should have handrails.  Have leaves, snow, and ice cleared regularly.  Use sand or salt on walkways during winter months.  In the garage, clean up grease or oil spills. BATHROOM  Install night lights.  Install grab bars by the toilet and in the tub and shower.  Use non-skid mats or decals in the tub or shower.  Place a plastic non-slip stool in the shower to sit on, if needed.  Keep floors dry and clean up all water on the floor immediately.  Remove soap buildup in the tub or shower on a regular basis.  Secure bath mats with non-slip, double-sided rug tape.  Remove throw rugs and tripping hazards from the floors. BEDROOMS  Install night lights.  Make sure a bedside light is easy to reach.  Do not use oversized bedding.  Keep a telephone by your bedside.  Have a firm chair with side arms to use for getting dressed.  Remove throw rugs and tripping hazards from the floor. KITCHEN  Keep handles on pots and pans turned toward the center of the stove. Use back burners when possible.  Clean up spills quickly and allow time for drying.  Avoid walking on wet floors.  Avoid hot utensils and knives.  Position shelves so they are not too high or low.  Place commonly used objects within easy reach.  If necessary, use a sturdy step stool with a grab bar when  reaching.  Keep electrical cables out of the way.  Do not use floor polish or wax that makes floors slippery. If you must use wax, use non-skid floor wax.  Remove throw rugs and tripping hazards from the floor. STAIRWAYS  Never leave objects on stairs.  Place handrails on both sides of stairways and use them. Fix any loose handrails. Make sure handrails on both sides of the stairways are as long as the stairs.  Check carpeting to make sure it is firmly attached along stairs. Make repairs to worn or loose carpet promptly.  Avoid placing throw rugs at the top or bottom of stairways, or properly secure the rug with carpet tape to prevent slippage. Get rid of throw rugs, if possible.  Have an electrician put in a light switch at the top and bottom of the stairs. OTHER FALL PREVENTION TIPS  Wear low-heel or rubber-soled shoes that are supportive and fit well. Wear closed toe shoes.  When using a stepladder, make sure it is fully opened and both spreaders are firmly locked. Do not climb a closed stepladder.  Add color or contrast paint or tape to grab bars and handrails in your home. Place contrasting color strips on first and last steps.  Learn and use mobility aids as needed. Install an electrical emergency response system.  Turn on lights to avoid dark areas. Replace light bulbs that burn out immediately. Get light switches that glow.  Arrange furniture to create clear pathways. Keep furniture in the same place.  Firmly attach carpet with non-skid or double-sided tape.  Eliminate uneven floor surfaces.  Select a carpet pattern that does not visually hide the edge of steps.  Be aware of all pets. OTHER HOME SAFETY TIPS  Set the water temperature for 120 F (48.8 C).  Keep emergency numbers on or near the telephone.  Keep smoke detectors on every level of the home and near sleeping areas. Document Released: 08/21/2002 Document Revised: 03/01/2012 Document Reviewed:  11/20/2011 [facility] Patient Information 2015 [place], [place]. This information is not intended to replace advice given to you by your health care provider. Make sure you discuss any questions you have with your health care provider.   Preventive Care for Adults Ages 103 and over  Blood pressure check.** / Every 1 to 2 years.  Lipid and cholesterol check.**/ Every 5 years beginning at age 61.  Lung cancer screening. / Every year if you are aged 27-80 years and have a 30-pack-year history of smoking and currently smoke or have quit within the past 15 years. Yearly screening is stopped once you have quit smoking for at least 15 years or develop a health problem that would prevent you from having lung cancer treatment.  Fecal occult blood test (FOBT) of stool. / Every year beginning at age 43 and continuing until age 7. You may not have to do this test if you get a colonoscopy every 10 years.  Flexible sigmoidoscopy** or colonoscopy.** / Every 5 years for a flexible sigmoidoscopy or every 10 years for a colonoscopy beginning at age 35 and continuing until age 58.  Hepatitis C blood test.** / For all people born from [birth year] through 1963 and any individual with known risks for hepatitis C.  Abdominal aortic aneurysm (AAA) screening.** / A one-time screening for ages 29 to 44 years who are current or former smokers.  Skin self-exam. / Monthly.  Influenza vaccine. / Every year.  Tetanus, diphtheria, and acellular pertussis (Tdap/Td) vaccine.** / 1 dose of Td every 10 years.  Varicella vaccine.** / Consult your health care provider.  Zoster vaccine.** / 1 dose for adults aged 59 years or older.  Pneumococcal 13-valent conjugate (PCV13) vaccine.** / Consult your health care provider.  Pneumococcal polysaccharide (PPSV23) vaccine.** / 1 dose for all adults aged 79 years and older.  Meningococcal vaccine.** / Consult your health care provider.  Hepatitis A vaccine.** / Consult your health care  provider.  Hepatitis B vaccine.** / Consult your health care provider.  Haemophilus influenzae type b (Hib) vaccine.** / Consult your health care provider. **Family history and personal history of risk and conditions may change your health care provider's recommendations. Document Released: 10/27/2001 Document Revised: 09/05/2013 Document Reviewed: 01/26/2011 [facility] Patient Information 2015 [place], [place]. This information is not intended to replace advice given to you by your health care provider. Make sure you discuss any questions you have with your health care provider.

## 2015-02-19 NOTE — Assessment & Plan Note (Signed)
Osteopenia: Patient notes good adherence to supplements and to Fosamax with no side effects. Previous BMD 08/2013, will discuss repeat BMD at next office visit.

## 2015-02-19 NOTE — Progress Notes (Signed)
Subjective:    Patient ID: [name], female    DOB: 11/11/1935, 79 y.o.   MRN: [id number]  DOS:  02/19/2015 Type of visit - description :  Interval history:   Here for Medicare AWV: 1. Risk factors based on Past M, S, F history: reviewed 2. Physical Activities:  Home chores, some yard work, no routine exercise   3. Depression/mood: Neg screen  4. Hearing: No problemss noted or reported   5. ADL's:  Completely independent   6. Fall Risk: no recent falls, counseled about prevention, see AVS 7. home Safety: does feel safe at home   8. Height, weight, &visual acuity: see VS, uses glasses, see eye doctor routinely   9. Counseling: provided 10. Labs ordered based on risk factors: if needed   11. Referral Coordination: if needed 12.  Care Plan, see assessment and plan , written plan provided   13.   Cognitive Assessment: motor skills and cognition wnl 14. Care team updated -- GI Dr Arelia Longest, Urology 15. End of life care discussed , she does have a H- POA  In addition, today we discussed the following:  Hypertension: Good compliance with medications, no side effects noted.   Hyperlipidemia: Good compliance with medications, patient notes no side effects from medication. Patient is fasting today.  Depression: Patient notes this is well-controlled with effexor, denies any suicidal ideation.  Osteopenia: Patient is adhering to calcium and vitamin D supplementation as well as Fosamax. Denies any recent falls.   L shoulder pain: Patient notes left intermittent dull aching located between left shoulder and neck. Patient notes pain resolves with Aleve. Denies any numbness or tingling in her extremities. Denies weakness.   Review of Systems  Constitutional: No fever. No chills. No unexplained wt changes. No unusual sweats  HEENT: No dental problems, no ear discharge, no facial swelling, no voice changes. No eye discharge, no eye redness, no intolerance to light   Respiratory: No  wheezing, no difficulty breathing. No cough, no mucus production  Cardiovascular: No CP, no palpitations. Left ankle swells when standing for a while.  GI: no nausea, no vomiting, no diarrhea, no abdominal pain.  No blood in the stools. No dysphagia, no odynophagia    Endocrine: No polyphagia, no polyuria, no polydipsia  GU: No dysuria, gross hematuria, difficulty urinating. No urinary urgency, no frequency.  Musculoskeletal: As per HPI  Skin: No change in the color of the skin, pallor, no rash  Allergic, immunologic: No environmental allergies, no food allergies  Neurological: No dizziness no syncope. No headaches. No diplopia, no slurred speech, no motor deficits, no facial numbness. No extremity numbness or tingling.  Hematological: No enlarged lymph nodes, no easy bruising, no unusual bleedings  Psychiatry: No suicidal ideas, no hallucinations, no behavior problems, no confusion.  No unusual/severe anxiety, no depression    Past Medical History  Diagnosis Date  . Hypertension   . Depression   . Osteopenia     rx fosamax 08-2013  . Hx of adenomatous colonic polyps   . Osteoarthritis   . History of gastroesophageal reflux (GERD)   . Hx of cardiac cath 2006    neg  . Aortic stenosis     ECHO 5-20ll mild AS but no evidence of HCM or MR--no further testing suggested   . OAB (overactive bladder) 07/13/2012  . Blood transfusion without reported diagnosis 1971    after hysterectomy  . GERD (gastroesophageal reflux disease)   . Personal history of colonic polyps - adenomas  04/19/2014    Past Surgical History  Procedure Laterality Date  . Cholecystectomy    . Abdominal hysterectomy  1971  . Oophorectomy  1971  . Appendectomy  1971    (at time of hyst?)  . Bladder surgery  2003  . Rotator cuff repair Right   . Toe surgery  1995    R 2nd --- correction of claw toe   . Cataract extraction, bilateral      History   Social History  . Marital Status: Married    Spouse  Name: N/A  . Number of Children: 2  . Years of Education: N/A   Occupational History  . stays at home     Social History Main Topics  . Smoking status: Never Smoker   . Smokeless tobacco: Never Used  . Alcohol Use: 0.0 oz/week    0 Standard drinks or equivalent per week     Comment: wine rarely  . Drug Use: No  . Sexual Activity: Not on file   Other Topics Concern  . Not on file   Social History Narrative         Family History  Problem Relation Age of Onset  . Coronary artery disease Father 4    had CABG  . Cancer Father     gallbladder  . Stroke Mother 47  . Ovarian cancer Sister 4  . Colon cancer Neg Hx     NEGATIVE  . Breast cancer Neg Hx     NEGATIVE      Medication List       This list is accurate as of: 02/19/15  3:43 PM.  Always use your most recent med list.               alendronate 70 MG tablet  Commonly known as:  FOSAMAX  Take 1 tablet (70 mg total) by mouth every 7 (seven) days. Take with a full glass of water on an empty stomach.     aspirin 81 MG tablet  Take 81 mg by mouth daily.     B-complex with vitamin C tablet  Take 1 tablet by mouth daily.     Cholecalciferol 1000 UNITS capsule  Take 1,000 Units by mouth daily.     Cinnamon 500 MG capsule  Take 500 mg by mouth daily.     FISH OIL PO  Take by mouth.     GERITOL PO  Take by mouth daily.     MYRBETRIQ 25 MG Tb24 tablet  Generic drug:  mirabegron ER  Take 25 mg by mouth daily.     NIFEdipine 60 MG 24 hr tablet  Commonly known as:  PROCARDIA-XL/ADALAT CC  TAKE ONE  BY MOUTH ONCE DAILY     OCUVITE PO  Take by mouth.     omeprazole 20 MG capsule  Commonly known as:  PRILOSEC  TAKE ONE CAPSULE BY MOUTH TWICE DAILY ON EMPTY STOMACH     simvastatin 20 MG tablet  Commonly known as:  ZOCOR  Take 1 tablet (20 mg total) by mouth at bedtime.     venlafaxine XR 37.5 MG 24 hr capsule  Commonly known as:  EFFEXOR-XR  TAKE ONE CAPSULE BY MOUTH ONCE DAILY     VITAMIN C PO    Take by mouth daily.     vitamin E 600 UNIT capsule  Take 600 Units by mouth daily.           Objective:   Physical Exam BP 126/78 mmHg  Pulse 81  Temp(Src) 97.9 F (36.6 C) (Oral)  Ht 5\' 7"  (1.702 m)  Wt 203 lb 2 oz (92.137 kg)  BMI 31.81 kg/m2  SpO2 96%  General:   Well developed, well nourished . NAD.  Neck:  Full range of motion. Supple. No  thyromegaly HEENT:  Normocephalic . Face symmetric, atraumatic Lungs:  CTA B Normal respiratory effort, no intercostal retractions, no accessory muscle use. Heart: RRR, II/VI HSM at right upper sternal border No pretibial edema bilaterally  Abdomen:  Not distended, soft, non-tender. No rebound or rigidity. No mass,organomegaly Faint epigastric abdominal bruit heard  Skin: Exposed areas without rash. Not pale. Not jaundice Neurologic:  alert & oriented X3.  Speech normal, gait appropriate for age and unassisted Psych: Cognition and judgment appear intact.  Cooperative with normal attention span and concentration.  Behavior appropriate. No anxious or depressed appearing. Breast: no dominant mass, skin and nipples normal to inspection on palpation, axillary areas without mass or lymphadenopathy       Assessment & Plan:   (Patient seen along with Aletta Edouard, medical student)   Hypertension: Well-controlled on current medications, BP today 126/78. Will check labs today, no changes to medications.  Hyperlipidemia: Good compliance to medication, will check FLP today and adjust medication pending labs.  Depression: Well-controlled on current medication, patient denies suicidal ideation. No changes to meds.  Left Shoulder Pain:  No neurological symptoms, likely a musculoskeletal process. Continue with Aleve, patient instructed to let us know if symptoms worsen. GI precautions discussed, take Tylenol if possible.

## 2015-02-19 NOTE — Assessment & Plan Note (Signed)
Td 2008  pneumonia shot 2005 and 2010  prevnar-- 2015 Shingles immunization done @ [facility]   Used to see gyn, h/o total hyst, no h/o abnormal paps, no further PAPs Breast exam today (-) will refer for MMG  last colonoscopy 04/2014, next per GI   Also abdominal bruit heard on exam, no abdominal masses felt.  Could be radiation of aortic stenosis or AAA. Plan: Will order ultrasound to check for AAA.

## 2015-02-19 NOTE — Telephone Encounter (Signed)
Unable to reach pre visit.  

## 2015-02-19 NOTE — Assessment & Plan Note (Signed)
Echocardiogram 02/2014 stable

## 2015-02-19 NOTE — Progress Notes (Signed)
Pre visit review using our clinic review tool, if applicable. No additional management support is needed unless otherwise documented below in the visit note. 

## 2015-02-22 ENCOUNTER — Ambulatory Visit (HOSPITAL_BASED_OUTPATIENT_CLINIC_OR_DEPARTMENT_OTHER)
Admission: RE | Admit: 2015-02-22 | Discharge: 2015-02-22 | Disposition: A | Discharge status: Home / Self Care | Payer: Medicare Other | Source: Ambulatory Visit | Attending: Internal Medicine | Admitting: Internal Medicine

## 2015-02-22 DIAGNOSIS — Z0000 Encounter for general adult medical examination without abnormal findings: Secondary | ICD-10-CM

## 2015-02-22 DIAGNOSIS — I714 Abdominal aortic aneurysm, without rupture: Secondary | ICD-10-CM | POA: Diagnosis not present

## 2015-02-22 DIAGNOSIS — Z136 Encounter for screening for cardiovascular disorders: Secondary | ICD-10-CM | POA: Diagnosis not present

## 2015-02-22 DIAGNOSIS — Z1231 Encounter for screening mammogram for malignant neoplasm of breast: Secondary | ICD-10-CM | POA: Insufficient documentation

## 2015-02-25 ENCOUNTER — Ambulatory Visit (HOSPITAL_BASED_OUTPATIENT_CLINIC_OR_DEPARTMENT_OTHER): Payer: Self-pay

## 2015-03-27 DIAGNOSIS — R350 Frequency of micturition: Secondary | ICD-10-CM | POA: Diagnosis not present

## 2015-03-27 DIAGNOSIS — N3946 Mixed incontinence: Secondary | ICD-10-CM | POA: Diagnosis not present

## 2015-03-27 DIAGNOSIS — R3915 Urgency of urination: Secondary | ICD-10-CM | POA: Diagnosis not present

## 2015-03-29 ENCOUNTER — Other Ambulatory Visit: Payer: Self-pay

## 2015-05-13 ENCOUNTER — Other Ambulatory Visit: Payer: Self-pay | Admitting: Internal Medicine

## 2015-05-24 ENCOUNTER — Ambulatory Visit (INDEPENDENT_AMBULATORY_CARE_PROVIDER_SITE_OTHER): Payer: Medicare Other

## 2015-05-24 DIAGNOSIS — Z23 Encounter for immunization: Secondary | ICD-10-CM | POA: Diagnosis not present

## 2015-07-17 DIAGNOSIS — H524 Presbyopia: Secondary | ICD-10-CM | POA: Diagnosis not present

## 2015-08-21 ENCOUNTER — Ambulatory Visit: Payer: Medicare Other | Admitting: Internal Medicine

## 2015-09-03 ENCOUNTER — Ambulatory Visit (INDEPENDENT_AMBULATORY_CARE_PROVIDER_SITE_OTHER): Payer: Medicare Other | Admitting: Internal Medicine

## 2015-09-03 ENCOUNTER — Encounter: Payer: Self-pay | Admitting: Internal Medicine

## 2015-09-03 VITALS — BP 118/72 | HR 94 | Temp 97.6°F | Ht 67.0 in | Wt 206.5 lb

## 2015-09-03 DIAGNOSIS — E785 Hyperlipidemia, unspecified: Secondary | ICD-10-CM | POA: Diagnosis not present

## 2015-09-03 DIAGNOSIS — I10 Essential (primary) hypertension: Secondary | ICD-10-CM

## 2015-09-03 DIAGNOSIS — Z09 Encounter for follow-up examination after completed treatment for conditions other than malignant neoplasm: Secondary | ICD-10-CM | POA: Insufficient documentation

## 2015-09-03 DIAGNOSIS — M25512 Pain in left shoulder: Secondary | ICD-10-CM | POA: Diagnosis not present

## 2015-09-03 NOTE — Progress Notes (Signed)
Pre visit review using our clinic review tool, if applicable. No additional management support is needed unless otherwise documented below in the visit note. 

## 2015-09-03 NOTE — Assessment & Plan Note (Signed)
HTN: Well-controlled, check a CBC, continue meds High cholesterol:  check a FLP. Shoulder pain: Some radiation to the neck, neurological examination is normal, refer to Ortho. RTC 6 months

## 2015-09-03 NOTE — Patient Instructions (Signed)
Before you leave the office:  Go to the front desk: Please schedule labs to be done within few days (fasting)  Schedule a complete physical exam to be done in 6 months  Please be fasting  We can also see you in the afternoon and check labs the next day

## 2015-09-03 NOTE — Progress Notes (Signed)
Subjective:    Patient ID: [name], female    DOB: 10-Nov-1935, 79 y.o.   MRN: [id number]  DOS:  09/03/2015 Type of visit - description : routine office visit Interval history: in general feeling well. Her other concern is left shoulder pain. That is hurting for a few months however symptoms increase after she had a fall 4 months ago, landed on the left elbow. Pain is concentrated of the left shoulder, somewhat radiates to the lateral aspect of the neck. No elbow pain per se. Denies any upper or lower extremity paresthesias.  Labs reviewed: due for a FLP and CBC Meds reviewed: Good compliance without apparent side effects  Review of Systems   Past Medical History  Diagnosis Date  . Hypertension   . Depression   . Osteopenia     rx fosamax 08-2013  . Hx of adenomatous colonic polyps   . Osteoarthritis   . History of gastroesophageal reflux (GERD)   . Hx of cardiac cath 2006    neg  . Aortic stenosis     ECHO 5-20ll mild AS but no evidence of HCM or MR--no further testing suggested   . OAB (overactive bladder) 07/13/2012  . Blood transfusion without reported diagnosis 1971    after hysterectomy  . GERD (gastroesophageal reflux disease)   . Personal history of colonic polyps - adenomas 04/19/2014  . Mixed urge and stress incontinence     Past Surgical History  Procedure Laterality Date  . Cholecystectomy    . Abdominal hysterectomy  1971  . Oophorectomy  1971  . Appendectomy  1971    (at time of hyst?)  . Bladder surgery  2003  . Rotator cuff repair Right   . Toe surgery  1995    R 2nd --- correction of claw toe   . Cataract extraction, bilateral      Social History   Social History  . Marital Status: Married    Spouse Name: N/A  . Number of Children: 2  . Years of Education: N/A   Occupational History  . stays at home     Social History Main Topics  . Smoking status: Never Smoker   . Smokeless tobacco: Never Used  . Alcohol Use: 0.0 oz/week    0  Standard drinks or equivalent per week     Comment: wine rarely  . Drug Use: No  . Sexual Activity: Not on file   Other Topics Concern  . Not on file   Social History Narrative            Medication List       This list is accurate as of: 09/03/15  5:49 PM.  Always use your most recent med list.               alendronate 70 MG tablet  Commonly known as:  FOSAMAX  Take 1 tablet (70 mg total) by mouth every 7 (seven) days. Take with a full glass of water on an empty stomach.     aspirin 81 MG tablet  Take 81 mg by mouth daily.     B-complex with vitamin C tablet  Take 1 tablet by mouth daily.     Cholecalciferol 1000 UNITS capsule  Take 1,000 Units by mouth daily.     Cinnamon 500 MG capsule  Take 500 mg by mouth daily.     FISH OIL PO  Take by mouth.     GERITOL PO  Take by mouth daily.  MYRBETRIQ 25 MG Tb24 tablet  Generic drug:  mirabegron ER  Take 25 mg by mouth daily.     NIFEdipine 60 MG 24 hr tablet  Commonly known as:  PROCARDIA-XL/ADALAT CC  Take 1 tablet (60 mg total) by mouth daily.     OCUVITE PO  Take by mouth.     omeprazole 20 MG capsule  Commonly known as:  PRILOSEC  Take 1 capsule (20 mg total) by mouth 2 (two) times daily before a meal.     simvastatin 20 MG tablet  Commonly known as:  ZOCOR  Take 1 tablet (20 mg total) by mouth at bedtime.     venlafaxine XR 37.5 MG 24 hr capsule  Commonly known as:  EFFEXOR-XR  Take 1 capsule (37.5 mg total) by mouth daily.     VITAMIN C PO  Take by mouth daily.     vitamin E 600 UNIT capsule  Take 600 Units by mouth daily.           Objective:   Physical Exam BP 118/72 mmHg  Pulse 94  Temp(Src) 97.6 F (36.4 C) (Oral)  Ht 5\' 7"  (1.702 m)  Wt 206 lb 8 oz (93.668 kg)  BMI 32.33 kg/m2  SpO2 94% General:   Well developed, well nourished . NAD.  HEENT:  Normocephalic . Face symmetric, atraumatic Neck: No TTP, range of motion slightly decreased. Shoulders:  Symmetric Right  shoulder normal.  Left shoulder: No deformities, range of motion decreased, unable to raise her arm all the way. Skin: Not pale. Not jaundice Neurologic:  alert & oriented X3.  Speech normal, gait appropriate for age and unassisted Strength and DTRs symmetric Psych--  Cognition and judgment appear intact.  Cooperative with normal attention span and concentration.  Behavior appropriate. No anxious or depressed appearing.      Assessment & Plan:   Assessment HTN Depression Osteopenia, t score 2008 -1.8, 2011 -1.8, 2014 -2.1: Fosamax Rx 08-2013 DJD Aortic stenosis , echo 2011 Overactive bladder  PLAN: HTN: Well-controlled, check a CBC, continue meds High cholesterol:  check a FLP. Shoulder pain: Some radiation to the neck, neurological examination is normal, refer to Ortho. RTC 6 months

## 2015-09-04 ENCOUNTER — Other Ambulatory Visit (INDEPENDENT_AMBULATORY_CARE_PROVIDER_SITE_OTHER): Payer: Medicare Other

## 2015-09-04 ENCOUNTER — Encounter: Payer: Self-pay | Admitting: Internal Medicine

## 2015-09-04 DIAGNOSIS — E785 Hyperlipidemia, unspecified: Secondary | ICD-10-CM

## 2015-09-04 DIAGNOSIS — I10 Essential (primary) hypertension: Secondary | ICD-10-CM | POA: Diagnosis not present

## 2015-09-04 LAB — CBC WITH DIFFERENTIAL/PLATELET
BASOS ABS: 0 10*3/uL (ref 0.0–0.1)
Basophils Relative: 0.7 % (ref 0.0–3.0)
EOS ABS: 0.4 10*3/uL (ref 0.0–0.7)
EOS PCT: 5 % (ref 0.0–5.0)
HEMATOCRIT: 38.3 % (ref 36.0–46.0)
Hemoglobin: 12.8 g/dL (ref 12.0–15.0)
Lymphocytes Relative: 18 % (ref 12.0–46.0)
Lymphs Abs: 1.3 10*3/uL (ref 0.7–4.0)
MCHC: 33.3 g/dL (ref 30.0–36.0)
MCV: 90.1 fl (ref 78.0–100.0)
MONO ABS: 0.8 10*3/uL (ref 0.1–1.0)
MONOS PCT: 10.6 % (ref 3.0–12.0)
Neutro Abs: 4.7 10*3/uL (ref 1.4–7.7)
Neutrophils Relative %: 65.7 % (ref 43.0–77.0)
PLATELETS: 216 10*3/uL (ref 150.0–400.0)
RBC: 4.25 Mil/uL (ref 3.87–5.11)
RDW: 12.7 % (ref 11.5–15.5)
WBC: 7.1 10*3/uL (ref 4.0–10.5)

## 2015-09-04 LAB — LIPID PANEL
Cholesterol: 162 mg/dL (ref 0–200)
HDL: 48.2 mg/dL (ref >39.00)
LDL Cholesterol: 91 mg/dL (ref 0–99)
NONHDL: 114.09
Total CHOL/HDL Ratio: 3
Triglycerides: 114 mg/dL (ref 0.0–149.0)
VLDL: 22.8 mg/dL (ref 0.0–40.0)

## 2015-11-19 ENCOUNTER — Other Ambulatory Visit: Payer: Self-pay | Admitting: Internal Medicine

## 2016-03-12 ENCOUNTER — Encounter: Payer: Self-pay | Admitting: Internal Medicine

## 2016-03-12 ENCOUNTER — Ambulatory Visit (INDEPENDENT_AMBULATORY_CARE_PROVIDER_SITE_OTHER): Payer: Medicare Other | Admitting: Internal Medicine

## 2016-03-12 VITALS — BP 126/70 | HR 76 | Temp 98.2°F | Ht 67.0 in | Wt 201.2 lb

## 2016-03-12 DIAGNOSIS — Z1231 Encounter for screening mammogram for malignant neoplasm of breast: Secondary | ICD-10-CM

## 2016-03-12 DIAGNOSIS — E559 Vitamin D deficiency, unspecified: Secondary | ICD-10-CM

## 2016-03-12 DIAGNOSIS — Z0001 Encounter for general adult medical examination with abnormal findings: Secondary | ICD-10-CM | POA: Diagnosis not present

## 2016-03-12 DIAGNOSIS — Z0000 Encounter for general adult medical examination without abnormal findings: Secondary | ICD-10-CM | POA: Diagnosis not present

## 2016-03-12 DIAGNOSIS — F32A Depression, unspecified: Secondary | ICD-10-CM

## 2016-03-12 DIAGNOSIS — F329 Major depressive disorder, single episode, unspecified: Secondary | ICD-10-CM | POA: Diagnosis not present

## 2016-03-12 DIAGNOSIS — E785 Hyperlipidemia, unspecified: Secondary | ICD-10-CM | POA: Diagnosis not present

## 2016-03-12 DIAGNOSIS — I10 Essential (primary) hypertension: Secondary | ICD-10-CM | POA: Diagnosis not present

## 2016-03-12 DIAGNOSIS — K219 Gastro-esophageal reflux disease without esophagitis: Secondary | ICD-10-CM

## 2016-03-12 DIAGNOSIS — R6889 Other general symptoms and signs: Secondary | ICD-10-CM

## 2016-03-12 DIAGNOSIS — M8580 Other specified disorders of bone density and structure, unspecified site: Secondary | ICD-10-CM | POA: Diagnosis not present

## 2016-03-12 DIAGNOSIS — Z780 Asymptomatic menopausal state: Secondary | ICD-10-CM

## 2016-03-12 LAB — BASIC METABOLIC PANEL
BUN: 25 mg/dL — AB (ref 6–23)
CHLORIDE: 104 meq/L (ref 96–112)
CO2: 27 mEq/L (ref 19–32)
CREATININE: 0.92 mg/dL (ref 0.40–1.20)
Calcium: 9.9 mg/dL (ref 8.4–10.5)
GFR: 62.75 mL/min (ref >60.00)
Glucose, Bld: 94 mg/dL (ref 70–99)
Potassium: 4 mEq/L (ref 3.5–5.1)
SODIUM: 139 meq/L (ref 135–145)

## 2016-03-12 LAB — AST: AST: 17 U/L (ref 0–37)

## 2016-03-12 LAB — ALT: ALT: 19 U/L (ref 0–35)

## 2016-03-12 NOTE — Assessment & Plan Note (Addendum)
Td 2008 ;  pneumonia shot 2005 and 2010 ;  prevnar-- 2015 Shingles immunization done @ [facility]   Used to see gyn, h/o total hyst, no h/o abnormal paps, no further PAPs Breast exam  and mammogram 2016: negative, Rx mammogram  last colonoscopy 04/2014, next per GI  Diet and exercise discussed

## 2016-03-12 NOTE — Patient Instructions (Signed)
GO TO THE LAB : Get the blood work     GO TO THE FRONT DESK Schedule your next appointment for a  routine checkup in 6 months    Fall Prevention and Home Safety Falls cause injuries and can affect all age groups. It is possible to use preventive measures to significantly decrease the likelihood of falls. There are many simple measures which can make your home safer and prevent falls. OUTDOORS  Repair cracks and edges of walkways and driveways.  Remove high doorway thresholds.  Trim shrubbery on the main path into your home.  Have good outside lighting.  Clear walkways of tools, rocks, debris, and clutter.  Check that handrails are not broken and are securely fastened. Both sides of steps should have handrails.  Have leaves, snow, and ice cleared regularly.  Use sand or salt on walkways during winter months.  In the garage, clean up grease or oil spills. BATHROOM  Install night lights.  Install grab bars by the toilet and in the tub and shower.  Use non-skid mats or decals in the tub or shower.  Place a plastic non-slip stool in the shower to sit on, if needed.  Keep floors dry and clean up all water on the floor immediately.  Remove soap buildup in the tub or shower on a regular basis.  Secure bath mats with non-slip, double-sided rug tape.  Remove throw rugs and tripping hazards from the floors. BEDROOMS  Install night lights.  Make sure a bedside light is easy to reach.  Do not use oversized bedding.  Keep a telephone by your bedside.  Have a firm chair with side arms to use for getting dressed.  Remove throw rugs and tripping hazards from the floor. KITCHEN  Keep handles on pots and pans turned toward the center of the stove. Use back burners when possible.  Clean up spills quickly and allow time for drying.  Avoid walking on wet floors.  Avoid hot utensils and knives.  Position shelves so they are not too high or low.  Place commonly used  objects within easy reach.  If necessary, use a sturdy step stool with a grab bar when reaching.  Keep electrical cables out of the way.  Do not use floor polish or wax that makes floors slippery. If you must use wax, use non-skid floor wax.  Remove throw rugs and tripping hazards from the floor. STAIRWAYS  Never leave objects on stairs.  Place handrails on both sides of stairways and use them. Fix any loose handrails. Make sure handrails on both sides of the stairways are as long as the stairs.  Check carpeting to make sure it is firmly attached along stairs. Make repairs to worn or loose carpet promptly.  Avoid placing throw rugs at the top or bottom of stairways, or properly secure the rug with carpet tape to prevent slippage. Get rid of throw rugs, if possible.  Have an electrician put in a light switch at the top and bottom of the stairs. OTHER FALL PREVENTION TIPS  Wear low-heel or rubber-soled shoes that are supportive and fit well. Wear closed toe shoes.  When using a stepladder, make sure it is fully opened and both spreaders are firmly locked. Do not climb a closed stepladder.  Add color or contrast paint or tape to grab bars and handrails in your home. Place contrasting color strips on first and last steps.  Learn and use mobility aids as needed. Install an electrical emergency response system.  Turn on lights to avoid dark areas. Replace light bulbs that burn out immediately. Get light switches that glow.  Arrange furniture to create clear pathways. Keep furniture in the same place.  Firmly attach carpet with non-skid or double-sided tape.  Eliminate uneven floor surfaces.  Select a carpet pattern that does not visually hide the edge of steps.  Be aware of all pets. OTHER HOME SAFETY TIPS  Set the water temperature for 120 F (48.8 C).  Keep emergency numbers on or near the telephone.  Keep smoke detectors on every level of the home and near sleeping  areas. Document Released: 08/21/2002 Document Revised: 03/01/2012 Document Reviewed: 11/20/2011 [facility] Patient Information 2015 [place], [place]. This information is not intended to replace advice given to you by your health care provider. Make sure you discuss any questions you have with your health care provider.   Preventive Care for Adults Ages 24 and over  Blood pressure check.** / Every 1 to 2 years.  Lipid and cholesterol check.**/ Every 5 years beginning at age 46.  Lung cancer screening. / Every year if you are aged 4-80 years and have a 30-pack-year history of smoking and currently smoke or have quit within the past 15 years. Yearly screening is stopped once you have quit smoking for at least 15 years or develop a health problem that would prevent you from having lung cancer treatment.  Fecal occult blood test (FOBT) of stool. / Every year beginning at age 49 and continuing until age 10. You may not have to do this test if you get a colonoscopy every 10 years.  Flexible sigmoidoscopy** or colonoscopy.** / Every 5 years for a flexible sigmoidoscopy or every 10 years for a colonoscopy beginning at age 67 and continuing until age 48.  Hepatitis C blood test.** / For all people born from [birth year] through 1963 and any individual with known risks for hepatitis C.  Abdominal aortic aneurysm (AAA) screening.** / A one-time screening for ages 45 to 36 years who are current or former smokers.  Skin self-exam. / Monthly.  Influenza vaccine. / Every year.  Tetanus, diphtheria, and acellular pertussis (Tdap/Td) vaccine.** / 1 dose of Td every 10 years.  Varicella vaccine.** / Consult your health care provider.  Zoster vaccine.** / 1 dose for adults aged 19 years or older.  Pneumococcal 13-valent conjugate (PCV13) vaccine.** / Consult your health care provider.  Pneumococcal polysaccharide (PPSV23) vaccine.** / 1 dose for all adults aged 26 years and older.  Meningococcal vaccine.** /  Consult your health care provider.  Hepatitis A vaccine.** / Consult your health care provider.  Hepatitis B vaccine.** / Consult your health care provider.  Haemophilus influenzae type b (Hib) vaccine.** / Consult your health care provider. **Family history and personal history of risk and conditions may change your health care provider's recommendations. Document Released: 10/27/2001 Document Revised: 09/05/2013 Document Reviewed: 01/26/2011 [facility] Patient Information 2015 [place], [place]. This information is not intended to replace advice given to you by your health care provider. Make sure you discuss any questions you have with your health care provider.

## 2016-03-12 NOTE — Progress Notes (Signed)
Subjective:    Patient ID: [name], female    DOB: 09/15/1935, 80 y.o.   MRN: [id number]  DOS:  03/12/2016 Type of visit - description : CPX Chronic medical problems: HTN: Good compliance of medications without apparent side effects. Osteopenia: Good compliance with Fosamax without apparent side effects Depression: Symptoms controlled on Effexor Hyperlipidemia: On simvastatin, no apparent side effects.     Review of Systems Constitutional: No fever. No chills. No unexplained wt changes. No unusual sweats  HEENT: No dental problems, no ear discharge, no facial swelling, no voice changes. No eye discharge, no eye  redness , no  intolerance to light   Respiratory: No wheezing , no  difficulty breathing. No cough , no mucus production  Cardiovascular: No CP, no palpitations. Occasionally has lower extremity edema around the ankles if she stays on her feet a lot.  GI: no nausea, no vomiting, no diarrhea , no  abdominal pain.  No blood in the stools. No dysphagia, no odynophagia    Endocrine: No polyphagia, no polyuria , no polydipsia  GU: No dysuria, gross hematuria, difficulty urinating. No urinary urgency, no frequency.  Musculoskeletal: No joint swellings or unusual aches or pains  Skin: No change in the color of the skin, palor , no  Rash  Allergic, immunologic: No environmental allergies , no  food allergies  Neurological: No dizziness no  syncope. No headaches. No diplopia, no slurred, no slurred speech, no motor deficits, no facial  Numbness  Hematological: No enlarged lymph nodes, no easy bruising , no unusual bleedings  Psychiatry: No suicidal ideas, no hallucinations, no beavior problems, no confusion.  No unusual/severe anxiety, no depression   Past Medical History  Diagnosis Date  . Hypertension   . Depression   . Osteopenia     rx fosamax 08-2013  . Hx of adenomatous colonic polyps   . Osteoarthritis   . History of gastroesophageal reflux (GERD)     . Hx of cardiac cath 2006    neg  . Aortic stenosis     ECHO 5-20ll mild AS but no evidence of HCM or MR--no further testing suggested   . OAB (overactive bladder) 07/13/2012  . Blood transfusion without reported diagnosis 1971    after hysterectomy  . GERD (gastroesophageal reflux disease)   . Personal history of colonic polyps - adenomas 04/19/2014  . Mixed urge and stress incontinence     Past Surgical History  Procedure Laterality Date  . Cholecystectomy    . Abdominal hysterectomy  1971  . Oophorectomy  1971  . Appendectomy  1971    (at time of hyst?)  . Bladder surgery  2003  . Rotator cuff repair Right   . Toe surgery  1995    R 2nd --- correction of claw toe   . Cataract extraction, bilateral      Social History   Social History  . Marital Status: Married    Spouse Name: N/A  . Number of Children: 2  . Years of Education: N/A   Occupational History  . stays at home     Social History Main Topics  . Smoking status: Never Smoker   . Smokeless tobacco: Never Used  . Alcohol Use: 0.0 oz/week    0 Standard drinks or equivalent per week     Comment: wine rarely  . Drug Use: No  . Sexual Activity: Not on file   Other Topics Concern  . Not on file   Social History Narrative  2 children, Mark Scientist, research (life sciences)), daughter [place] police      Family History  Problem Relation Age of Onset  . Coronary artery disease Father 64    had CABG  . Cancer Father     gallbladder  . Stroke Mother 83  . Ovarian cancer Sister 26  . Colon cancer Neg Hx     NEGATIVE  . Breast cancer Neg Hx     NEGATIVE       Medication List       This list is accurate as of: 03/12/16  1:53 PM.  Always use your most recent med list.               alendronate 70 MG tablet  Commonly known as:  FOSAMAX  Take 1 tablet (70 mg total) by mouth every 7 (seven) days. Take with a full glass of water on an empty stomach.     aspirin 81 MG tablet  Take 81 mg by mouth daily.     B-complex with  vitamin C tablet  Take 1 tablet by mouth daily.     Cholecalciferol 1000 units capsule  Take 1,000 Units by mouth daily.     Cinnamon 500 MG capsule  Take 500 mg by mouth daily.     FISH OIL PO  Take by mouth.     GERITOL PO  Take by mouth daily.     MYRBETRIQ 25 MG Tb24 tablet  Generic drug:  mirabegron ER  Take 25 mg by mouth daily.     NIFEdipine 60 MG 24 hr tablet  Commonly known as:  PROCARDIA XL/ADALAT-CC  Take 1 tablet (60 mg total) by mouth daily.     OCUVITE PO  Take by mouth.     omeprazole 20 MG capsule  Commonly known as:  PRILOSEC  Take 1 capsule (20 mg total) by mouth 2 (two) times daily before a meal.     simvastatin 20 MG tablet  Commonly known as:  ZOCOR  Take 1 tablet (20 mg total) by mouth at bedtime.     venlafaxine XR 37.5 MG 24 hr capsule  Commonly known as:  EFFEXOR-XR  Take 1 capsule (37.5 mg total) by mouth daily.     VITAMIN C PO  Take by mouth daily.     vitamin E 600 UNIT capsule  Take 600 Units by mouth daily.           Objective:   Physical Exam BP 126/70 mmHg  Pulse 76  Temp(Src) 98.2 F (36.8 C) (Oral)  Ht 5\' 7"  (1.702 m)  Wt 201 lb 4 oz (91.286 kg)  BMI 31.51 kg/m2  SpO2 95%  General:   Well developed, well nourished . NAD.  Neck: No  thyromegaly  HEENT:  Normocephalic . Face symmetric, atraumatic Lungs:  CTA B Normal respiratory effort, no intercostal retractions, no accessory muscle use. Heart: RRR,  + systolic murmur.  No pretibial edema bilaterally  Abdomen:  Not distended, soft, non-tender. No rebound or rigidity.   Skin: Exposed areas without rash. Not pale. Not jaundice Neurologic:  alert & oriented X3.  Speech normal, gait appropriate for age and unassisted Strength symmetric and appropriate for age.  Psych: Cognition and judgment appear intact.  Cooperative with normal attention span and concentration.  Behavior appropriate. No anxious or depressed appearing.    Assessment & Plan:    Assessment HTN Hyperlipidemia  GERD Depression Osteopenia, t score 2008 -1.8, 2011 -1.8, 2014 -2.1: Fosamax Rx 08-2013 H/o vit d  def  DJD Aortic stenosis ( at some point diagnosed with HOCM) ---echo 01-2010: "Mild AS but no evidence of HCM or MR. ---Echo 6-15 mild AoS. Overactive bladder Ao [place]: wnl 02-2015  PLAN: HTN: Continue Procardia, check a BMP Hyperlipidemia: Continue simvastatin, last FLP satisfactory, check LFTs GERD: Sx controlled as long as she takes PPIs Depression: Controlled on Effexor Osteopenia: Continue Fosamax, check vitamin D and a bone density test RTC 6 months

## 2016-03-12 NOTE — Assessment & Plan Note (Signed)
HTN: Continue Procardia, check a BMP Hyperlipidemia: Continue simvastatin, last FLP satisfactory, check LFTs GERD: Sx controlled as long as she takes PPIs Depression: Controlled on Effexor Osteopenia: Continue Fosamax, check vitamin D and a bone density test RTC 6 months

## 2016-03-12 NOTE — Progress Notes (Signed)
Pre visit review using our clinic review tool, if applicable. No additional management support is needed unless otherwise documented below in the visit note. 

## 2016-03-15 LAB — VITAMIN D 1,25 DIHYDROXY
VITAMIN D 1, 25 (OH) TOTAL: 46 pg/mL (ref 18–72)
Vitamin D2 1, 25 (OH)2: <8 pg/mL
Vitamin D3 1, 25 (OH)2: 46 pg/mL

## 2016-03-16 ENCOUNTER — Ambulatory Visit (HOSPITAL_BASED_OUTPATIENT_CLINIC_OR_DEPARTMENT_OTHER)
Admission: RE | Admit: 2016-03-16 | Discharge: 2016-03-16 | Disposition: A | Discharge status: Home / Self Care | Payer: Medicare Other | Source: Ambulatory Visit | Attending: Internal Medicine | Admitting: Internal Medicine

## 2016-03-16 DIAGNOSIS — Z780 Asymptomatic menopausal state: Secondary | ICD-10-CM | POA: Diagnosis not present

## 2016-03-16 DIAGNOSIS — Z1231 Encounter for screening mammogram for malignant neoplasm of breast: Secondary | ICD-10-CM | POA: Insufficient documentation

## 2016-03-16 DIAGNOSIS — M85852 Other specified disorders of bone density and structure, left thigh: Secondary | ICD-10-CM | POA: Insufficient documentation

## 2016-03-16 DIAGNOSIS — E559 Vitamin D deficiency, unspecified: Secondary | ICD-10-CM | POA: Insufficient documentation

## 2016-03-16 DIAGNOSIS — M8588 Other specified disorders of bone density and structure, other site: Secondary | ICD-10-CM | POA: Diagnosis not present

## 2016-03-16 DIAGNOSIS — M8580 Other specified disorders of bone density and structure, unspecified site: Secondary | ICD-10-CM | POA: Diagnosis present

## 2016-03-19 ENCOUNTER — Encounter: Payer: Self-pay | Admitting: Family

## 2016-05-20 ENCOUNTER — Other Ambulatory Visit: Payer: Self-pay | Admitting: Internal Medicine

## 2016-08-10 ENCOUNTER — Other Ambulatory Visit: Payer: Self-pay | Admitting: Internal Medicine

## 2016-09-16 ENCOUNTER — Encounter: Payer: Self-pay | Admitting: Internal Medicine

## 2016-09-16 ENCOUNTER — Ambulatory Visit (INDEPENDENT_AMBULATORY_CARE_PROVIDER_SITE_OTHER): Payer: Medicare Other | Admitting: Internal Medicine

## 2016-09-16 VITALS — BP 126/82 | HR 82 | Temp 97.8°F | Resp 14 | Ht 67.0 in | Wt 196.2 lb

## 2016-09-16 DIAGNOSIS — F329 Major depressive disorder, single episode, unspecified: Secondary | ICD-10-CM | POA: Diagnosis not present

## 2016-09-16 DIAGNOSIS — N3281 Overactive bladder: Secondary | ICD-10-CM | POA: Diagnosis not present

## 2016-09-16 DIAGNOSIS — E785 Hyperlipidemia, unspecified: Secondary | ICD-10-CM | POA: Diagnosis not present

## 2016-09-16 DIAGNOSIS — I10 Essential (primary) hypertension: Secondary | ICD-10-CM

## 2016-09-16 DIAGNOSIS — F32A Depression, unspecified: Secondary | ICD-10-CM

## 2016-09-16 LAB — BASIC METABOLIC PANEL
BUN: 25 mg/dL — ABNORMAL HIGH (ref 6–23)
CALCIUM: 9.7 mg/dL (ref 8.4–10.5)
CO2: 28 mEq/L (ref 19–32)
Chloride: 106 mEq/L (ref 96–112)
Creatinine, Ser: 0.87 mg/dL (ref 0.40–1.20)
GFR: 66.84 mL/min (ref >60.00)
Glucose, Bld: 100 mg/dL — ABNORMAL HIGH (ref 70–99)
Potassium: 4 mEq/L (ref 3.5–5.1)
Sodium: 142 mEq/L (ref 135–145)

## 2016-09-16 LAB — LIPID PANEL
CHOLESTEROL: 167 mg/dL (ref 0–200)
HDL: 50.6 mg/dL (ref >39.00)
LDL Cholesterol: 97 mg/dL (ref 0–99)
NonHDL: 116.79
TRIGLYCERIDES: 100 mg/dL (ref 0.0–149.0)
Total CHOL/HDL Ratio: 3
VLDL: 20 mg/dL (ref 0.0–40.0)

## 2016-09-16 MED ORDER — MIRABEGRON ER 25 MG PO TB24: 25.0000 mg | ORAL_TABLET | Freq: Every day | ORAL | 12 refills | Status: DC

## 2016-09-16 NOTE — Assessment & Plan Note (Signed)
HTN: Continue Procardia, no ambulatory BPs, BP today is very good. Check a BMP Hyperlipidemia: Continue simvastatin, last LFT satisfactory, check a FLP Depression: On Effexor, + stress due to husband retirement, she is counseled. OAB: Likes to start getting Myrbetriq from here, previously rx by urology; med works well for her. Does not recall trying other medications. Reports may have problems affording her medication at the end of the year, at that time will probably rx a less expensive medication, or go without medicine if she is intolerant to other meds s/e . RTC 02-2017, CPX

## 2016-09-16 NOTE — Progress Notes (Signed)
Pre visit review using our clinic review tool, if applicable. No additional management support is needed unless otherwise documented below in the visit note. 

## 2016-09-16 NOTE — Patient Instructions (Signed)
GO TO THE LAB : Get the blood work     GO TO THE FRONT DESK Schedule your next appointment for a  Physical by [phone]

## 2016-09-16 NOTE — Progress Notes (Signed)
Subjective:    Patient ID: [name], female    DOB: Nov 22, 1935, 81 y.o.   MRN: [id number]  DOS:  09/16/2016 Type of visit - description : rov Interval history: High cholesterol: good med compliance , is doing better with diet and exercise, has lost some weight. HTN: Good medication compliance, no ambulatory BP Depression: Her husband retired, they are going through some adjustment, + stress. Overactive bladder: Symptoms well controlled with current meds, likes for me to start prescribing her medications.  Review of Systems No nausea or vomiting No dysuria, gross hematuria difficulty urinating  Past Medical History:  Diagnosis Date  . Aortic stenosis    ECHO 5-20ll mild AS but no evidence of HCM or MR--no further testing suggested   . Blood transfusion without reported diagnosis 1971   after hysterectomy  . Depression   . GERD (gastroesophageal reflux disease)   . History of gastroesophageal reflux (GERD)   . Hx of adenomatous colonic polyps   . Hx of cardiac cath 2006   neg  . Hypertension   . Mixed urge and stress incontinence   . OAB (overactive bladder) 07/13/2012  . Osteoarthritis   . Osteopenia    rx fosamax 08-2013  . Personal history of colonic polyps - adenomas 04/19/2014    Past Surgical History:  Procedure Laterality Date  . ABDOMINAL HYSTERECTOMY  1971  . APPENDECTOMY  1971   (at time of hyst?)  . BLADDER SURGERY  2003  . CATARACT EXTRACTION, BILATERAL    . CHOLECYSTECTOMY    . OOPHORECTOMY  1971  . ROTATOR CUFF REPAIR Right   . TOE SURGERY  1995   R 2nd --- correction of claw toe     Social History   Social History  . Marital status: Married    Spouse name: N/A  . Number of children: 2  . Years of education: N/A   Occupational History  . stays at home  Retired   Social History Main Topics  . Smoking status: Never Smoker  . Smokeless tobacco: Never Used  . Alcohol use 0.0 oz/week     Comment: wine rarely  . Drug use: No  . Sexual  activity: Not on file   Other Topics Concern  . Not on file   Social History Narrative   2 children, Elta [place] (airforce), daughter [employer] police       Allergies as of 09/16/2016   No Known Allergies     Medication List       Accurate as of 09/16/16  3:19 PM. Always use your most recent med list.          alendronate 70 MG tablet Commonly known as:  FOSAMAX Take 1 tablet (70 mg total) by mouth every 7 (seven) days. Take with a full glass of water on an empty stomach.   aspirin 81 MG tablet Take 81 mg by mouth daily.   B-complex with vitamin C tablet Take 1 tablet by mouth daily.   Cholecalciferol 1000 units capsule Take 1,000 Units by mouth daily.   Cinnamon 500 MG capsule Take 500 mg by mouth daily.   FISH OIL PO Take by mouth.   GERITOL PO Take by mouth daily.   mirabegron ER 25 MG Tb24 tablet Commonly known as:  MYRBETRIQ Take 1 tablet (25 mg total) by mouth daily.   NIFEdipine 60 MG 24 hr tablet Commonly known as:  PROCARDIA XL/ADALAT-CC Take 1 tablet (60 mg total) by mouth daily.   OCUVITE PO  Take by mouth.   omeprazole 20 MG capsule Commonly known as:  PRILOSEC Take 1 capsule (20 mg total) by mouth 2 (two) times daily before a meal.   simvastatin 20 MG tablet Commonly known as:  ZOCOR Take 1 tablet (20 mg total) by mouth daily with breakfast.   venlafaxine XR 37.5 MG 24 hr capsule Commonly known as:  EFFEXOR-XR Take 1 capsule (37.5 mg total) by mouth daily.   VITAMIN C PO Take by mouth daily.   vitamin E 600 UNIT capsule Take 600 Units by mouth daily.          Objective:   Physical Exam BP 126/82 (BP Location: Left Arm, Patient Position: Sitting, Cuff Size: Normal)   Pulse 82   Temp 97.8 F (36.6 C) (Oral)   Resp 14   Ht 5\' 7"  (1.702 m)   Wt 196 lb 4 oz (89 kg)   SpO2 98%   BMI 30.74 kg/m  General:   Well developed, well nourished . NAD.  HEENT:  Normocephalic . Face symmetric, atraumatic Lungs:  CTA B Normal respiratory effort,  no intercostal retractions, no accessory muscle use. Heart: RRR,  no murmur.  No pretibial edema bilaterally  Skin: Not pale. Not jaundice Neurologic:  alert & oriented X3.  Speech normal, gait appropriate for age and unassisted Psych--  Cognition and judgment appear intact.  Cooperative with normal attention span and concentration.  Behavior appropriate. No anxious or depressed appearing.      Assessment & Plan:    Assessment HTN Hyperlipidemia  GERD Depression Osteopenia, t score 2008 -1.8, 2011 -1.8, 2014 -2.1: Fosamax Rx 08-2013 H/o vit d def  DJD Aortic stenosis ( at some point diagnosed with HOCM) ---echo 01-2010: "Mild AS but no evidence of HCM or MR. ---Echo 6-15 mild AoS. Overactive bladder- pcp started to rx meds 09-2016  Ao [place]: wnl 02-2015  PLAN: HTN: Continue Procardia, no ambulatory BPs, BP today is very good. Check a BMP Hyperlipidemia: Continue simvastatin, last LFT satisfactory, check a FLP Depression: On Effexor, + stress due to husband retirement, she is counseled. OAB: Likes to start getting Myrbetriq from here, previously rx by urology; med works well for her. Does not recall trying other medications. Reports may have problems affording her medication at the end of the year, at that time will probably rx a less expensive medication, or go without medicine if she is intolerant to other meds s/e . RTC 02-2017, CPX

## 2016-09-22 ENCOUNTER — Other Ambulatory Visit: Payer: Self-pay | Admitting: Internal Medicine

## 2017-02-03 ENCOUNTER — Other Ambulatory Visit: Payer: Self-pay

## 2017-02-03 MED ORDER — VENLAFAXINE HCL ER 37.5 MG PO CP24: 37.5000 mg | ORAL_CAPSULE | Freq: Every day | ORAL | 1 refills | Status: DC

## 2017-02-03 MED ORDER — OMEPRAZOLE 20 MG PO CPDR: 20.0000 mg | DELAYED_RELEASE_CAPSULE | Freq: Two times a day (BID) | ORAL | 1 refills | Status: DC

## 2017-02-03 MED ORDER — NIFEDIPINE ER OSMOTIC RELEASE 60 MG PO TB24: 60.0000 mg | ORAL_TABLET | Freq: Every day | ORAL | 1 refills | Status: DC

## 2017-02-03 MED ORDER — SIMVASTATIN 20 MG PO TABS: 20.0000 mg | ORAL_TABLET | Freq: Every day | ORAL | 1 refills | Status: DC

## 2017-02-18 ENCOUNTER — Other Ambulatory Visit: Payer: Self-pay

## 2017-02-18 MED ORDER — SIMVASTATIN 20 MG PO TABS: 20.0000 mg | ORAL_TABLET | Freq: Every day | ORAL | 1 refills | Status: DC

## 2017-03-16 ENCOUNTER — Encounter: Payer: Self-pay | Admitting: Internal Medicine

## 2017-03-16 ENCOUNTER — Ambulatory Visit (INDEPENDENT_AMBULATORY_CARE_PROVIDER_SITE_OTHER): Payer: Medicare Other | Admitting: Internal Medicine

## 2017-03-16 VITALS — BP 122/66 | HR 85 | Temp 98.0°F | Resp 14 | Ht 67.0 in | Wt 192.5 lb

## 2017-03-16 DIAGNOSIS — Z0001 Encounter for general adult medical examination with abnormal findings: Secondary | ICD-10-CM | POA: Diagnosis not present

## 2017-03-16 DIAGNOSIS — F329 Major depressive disorder, single episode, unspecified: Secondary | ICD-10-CM | POA: Diagnosis not present

## 2017-03-16 DIAGNOSIS — E785 Hyperlipidemia, unspecified: Secondary | ICD-10-CM | POA: Diagnosis not present

## 2017-03-16 DIAGNOSIS — Z0000 Encounter for general adult medical examination without abnormal findings: Secondary | ICD-10-CM

## 2017-03-16 DIAGNOSIS — M8580 Other specified disorders of bone density and structure, unspecified site: Secondary | ICD-10-CM | POA: Diagnosis not present

## 2017-03-16 DIAGNOSIS — K219 Gastro-esophageal reflux disease without esophagitis: Secondary | ICD-10-CM | POA: Diagnosis not present

## 2017-03-16 DIAGNOSIS — R300 Dysuria: Secondary | ICD-10-CM

## 2017-03-16 DIAGNOSIS — N3281 Overactive bladder: Secondary | ICD-10-CM | POA: Diagnosis not present

## 2017-03-16 DIAGNOSIS — I10 Essential (primary) hypertension: Secondary | ICD-10-CM

## 2017-03-16 LAB — URINALYSIS, ROUTINE W REFLEX MICROSCOPIC
Bilirubin Urine: NEGATIVE
Hgb urine dipstick: NEGATIVE
KETONES UR: NEGATIVE
NITRITE: NEGATIVE
PH: 6 (ref 5.0–8.0)
SPECIFIC GRAVITY, URINE: 1.015 (ref 1.000–1.030)
Urine Glucose: NEGATIVE
Urobilinogen, UA: 0.2 (ref 0.0–1.0)

## 2017-03-16 LAB — CBC WITH DIFFERENTIAL/PLATELET
Basophils Absolute: 0 10*3/uL (ref 0.0–0.1)
Basophils Relative: 0.7 % (ref 0.0–3.0)
Eosinophils Absolute: 0.2 10*3/uL (ref 0.0–0.7)
Eosinophils Relative: 4.9 % (ref 0.0–5.0)
HCT: 36.8 % (ref 36.0–46.0)
HEMOGLOBIN: 12.7 g/dL (ref 12.0–15.0)
Lymphocytes Relative: 25.2 % (ref 12.0–46.0)
Lymphs Abs: 1.2 10*3/uL (ref 0.7–4.0)
MCHC: 34.4 g/dL (ref 30.0–36.0)
MCV: 88.9 fl (ref 78.0–100.0)
MONO ABS: 0.6 10*3/uL (ref 0.1–1.0)
Monocytes Relative: 12.3 % — ABNORMAL HIGH (ref 3.0–12.0)
NEUTROS PCT: 56.9 % (ref 43.0–77.0)
Neutro Abs: 2.8 10*3/uL (ref 1.4–7.7)
Platelets: 239 10*3/uL (ref 150.0–400.0)
RBC: 4.14 Mil/uL (ref 3.87–5.11)
RDW: 12.3 % (ref 11.5–15.5)
WBC: 4.8 10*3/uL (ref 4.0–10.5)

## 2017-03-16 LAB — COMPREHENSIVE METABOLIC PANEL
ALBUMIN: 4.3 g/dL (ref 3.5–5.2)
ALK PHOS: 61 U/L (ref 39–117)
ALT: 17 U/L (ref 0–35)
AST: 16 U/L (ref 0–37)
BILIRUBIN TOTAL: 0.3 mg/dL (ref 0.2–1.2)
BUN: 27 mg/dL — AB (ref 6–23)
CO2: 27 mEq/L (ref 19–32)
Calcium: 9.9 mg/dL (ref 8.4–10.5)
Chloride: 107 mEq/L (ref 96–112)
Creatinine, Ser: 0.89 mg/dL (ref 0.40–1.20)
GFR: 65.02 mL/min (ref >60.00)
Glucose, Bld: 109 mg/dL — ABNORMAL HIGH (ref 70–99)
Potassium: 3.7 mEq/L (ref 3.5–5.1)
SODIUM: 142 meq/L (ref 135–145)
TOTAL PROTEIN: 7.2 g/dL (ref 6.0–8.3)

## 2017-03-16 LAB — TSH: TSH: 4.04 u[IU]/mL (ref 0.35–4.50)

## 2017-03-16 MED ORDER — OXYBUTYNIN CHLORIDE 5 MG PO TABS: 5.0000 mg | ORAL_TABLET | Freq: Three times a day (TID) | ORAL | 1 refills | Status: DC

## 2017-03-16 NOTE — Assessment & Plan Note (Addendum)
--  Td 2008 ;  pneumonia shot 2005 and 2010;  prevnar: 2015; Shingles immunization done @ [facility] ; shingrex on back order, ok to take it when available --no  further PAPs --Breast  2016: negative, 03-2016  mammogram (-) -- [facility]: last colonoscopy 04/2014, next per GI -- Labs: CMP, CBC, TSH, UA, urine culture --Diet and exercise discussed

## 2017-03-16 NOTE — Progress Notes (Signed)
Subjective:    Patient ID: [name], female    DOB: 1936-03-31, 81 y.o.   MRN: [id number]  DOS:  03/16/2017 Type of visit - description : cpx Interval history: No major concerns  Review of Systems   some dysuria for the last few days. Denies gross hematuria, difficulty urinating, fever or chills  Other than above, a 14 point review of systems is negative    Past Medical History:  Diagnosis Date  . Aortic stenosis    ECHO 5-20ll mild AS but no evidence of HCM or MR--no further testing suggested   . Blood transfusion without reported diagnosis 1971   after hysterectomy  . Depression   . GERD (gastroesophageal reflux disease)   . History of gastroesophageal reflux (GERD)   . Hx of adenomatous colonic polyps   . Hx of cardiac cath 2006   neg  . Hypertension   . Mixed urge and stress incontinence   . OAB (overactive bladder) 07/13/2012  . Osteoarthritis   . Osteopenia    rx fosamax 08-2013  . Personal history of colonic polyps - adenomas 04/19/2014    Past Surgical History:  Procedure Laterality Date  . ABDOMINAL HYSTERECTOMY  1971  . APPENDECTOMY  1971   (at time of hyst?)  . BLADDER SURGERY  2003  . CATARACT EXTRACTION, BILATERAL    . CHOLECYSTECTOMY    . OOPHORECTOMY  1971  . ROTATOR CUFF REPAIR Right   . TOE SURGERY  1995   R 2nd --- correction of claw toe     Social History   Social History  . Marital status: Married    Spouse name: N/A  . Number of children: 2  . Years of education: N/A   Occupational History  . stays at home  Retired   Social History Main Topics  . Smoking status: Never Smoker  . Smokeless tobacco: Never Used  . Alcohol use 0.0 oz/week     Comment: wine rarely  . Drug use: No  . Sexual activity: Not on file   Other Topics Concern  . Not on file   Social History Narrative   2 children, Elta Guadeloupe Scientist, research (life sciences)), daughter [place] police      Family History  Problem Relation Age of Onset  . Coronary artery disease Father 50       had  CABG  . Cancer Father        gallbladder  . Stroke Mother 20  . Ovarian cancer Sister 63  . Colon cancer Neg Hx        NEGATIVE  . Breast cancer Neg Hx        NEGATIVE     Allergies as of 03/16/2017   No Known Allergies     Medication List       Accurate as of 03/16/17 11:59 PM. Always use your most recent med list.          alendronate 70 MG tablet Commonly known as:  FOSAMAX Take 1 tablet (70 mg total) by mouth every 7 (seven) days. Take with a full glass of water on an empty stomach.   aspirin 81 MG tablet Take 81 mg by mouth daily.   B-complex with vitamin C tablet Take 1 tablet by mouth daily.   Cholecalciferol 1000 units capsule Take 1,000 Units by mouth daily.   Cinnamon 500 MG capsule Take 500 mg by mouth daily.   FISH OIL PO Take by mouth.   GERITOL PO Take by mouth daily.  NIFEdipine 60 MG 24 hr tablet Commonly known as:  PROCARDIA XL/ADALAT-CC Take 1 tablet (60 mg total) by mouth daily.   OCUVITE PO Take by mouth.   omeprazole 20 MG capsule Commonly known as:  PRILOSEC Take 1 capsule (20 mg total) by mouth 2 (two) times daily before a meal.   oxybutynin 5 MG tablet Commonly known as:  DITROPAN Take 1 tablet (5 mg total) by mouth 3 (three) times daily.   simvastatin 20 MG tablet Commonly known as:  ZOCOR Take 1 tablet (20 mg total) by mouth daily with breakfast.   venlafaxine XR 37.5 MG 24 hr capsule Commonly known as:  EFFEXOR-XR Take 1 capsule (37.5 mg total) by mouth daily.   VITAMIN C PO Take by mouth daily.   vitamin E 600 UNIT capsule Take 600 Units by mouth daily.          Objective:   Physical Exam BP 122/66 (BP Location: Left Arm, Patient Position: Sitting, Cuff Size: Small)   Pulse 85   Temp 98 F (36.7 C) (Oral)   Resp 14   Ht 5\' 7"  (1.702 m)   Wt 192 lb 8 oz (87.3 kg)   SpO2 97%   BMI 30.15 kg/m   General:   Well developed, well nourished . NAD.  Neck: No  thyromegaly  HEENT:  Normocephalic . Face  symmetric, atraumatic Lungs:  CTA B Normal respiratory effort, no intercostal retractions, no accessory muscle use. Heart: RRR,  + systolic murmur.  No pretibial edema bilaterally  Abdomen:  Not distended, soft, non-tender. No rebound or rigidity.   Skin: Exposed areas without rash. Not pale. Not jaundice Neurologic:  alert & oriented X3.  Speech normal, gait appropriate for age and unassisted Strength symmetric and appropriate for age.  Psych: Cognition and judgment appear intact.  Cooperative with normal attention span and concentration.  Behavior appropriate. No anxious or depressed appearing.    Assessment & Plan:    Assessment HTN Hyperlipidemia  GERD Depression Osteopenia, t score 2008 -1.8, 2011 -1.8, 2014 -2.1: Fosamax Rx 08-2013 t score 03-2016: -2.1. H/o vit d def  DJD Aortic stenosis ( at some point diagnosed with HOCM) ---echo 01-2010: "Mild AS but no evidence of HCM or MR. ---Echo 6-15 mild AoS. Overactive bladder- pcp started to rx meds 09-2016  Ao [place]: wnl 02-2015  PLAN: HTN, hyperlipidemia, GERD, depression: All seem control, continue nifedipine, Prilosec, simvastatin and Effexor. Checking labs. Osteopenia: Last T score -2.1 (03-2016) continue Fosamax, calcium, vitamin D, last vitamin D levels within normal OAB: Myrbetriq works but is expensive, she has dysuria. Will switch to oxybutynin, s/e d/w pt, no h/o glaucoma, check a UA urine culture. ABX if appropriate RTC 6 months

## 2017-03-16 NOTE — Patient Instructions (Addendum)
GO TO THE LAB : Get the blood work     GO TO THE FRONT DESK Schedule your next appointment for a  routine checkup in 6 months  Please schedule a Medicare wellness with one of our nurses  Stop myrbetric, start oxybutynin 1 tablet 3 times a day. Watch for side effects. Okay to take it only at night or as needed.

## 2017-03-16 NOTE — Progress Notes (Signed)
Pre visit review using our clinic review tool, if applicable. No additional management support is needed unless otherwise documented below in the visit note. 

## 2017-03-17 NOTE — Assessment & Plan Note (Signed)
HTN, hyperlipidemia, GERD, depression: All seem control, continue nifedipine, Prilosec, simvastatin and Effexor. Checking labs. Osteopenia: Last T score -2.1 (03-2016) continue Fosamax, calcium, vitamin D, last vitamin D levels within normal OAB: Myrbetriq works but is expensive, she has dysuria. Will switch to oxybutynin, s/e d/w pt, no h/o glaucoma, check a UA urine culture. ABX if appropriate RTC 6 months

## 2017-03-19 LAB — URINE CULTURE

## 2017-03-19 MED ORDER — SULFAMETHOXAZOLE-TRIMETHOPRIM 800-160 MG PO TABS: 1.0000 | ORAL_TABLET | Freq: Two times a day (BID) | ORAL | 0 refills | Status: DC

## 2017-03-19 NOTE — Addendum Note (Signed)
Addended byDamita Dunnings D on: 03/19/2017 04:41 PM   Modules accepted: Orders

## 2017-05-03 ENCOUNTER — Other Ambulatory Visit: Payer: Self-pay

## 2017-05-03 MED ORDER — OMEPRAZOLE 20 MG PO CPDR: 20.0000 mg | DELAYED_RELEASE_CAPSULE | Freq: Two times a day (BID) | ORAL | 1 refills | Status: DC

## 2017-06-02 ENCOUNTER — Other Ambulatory Visit: Payer: Self-pay | Admitting: Internal Medicine

## 2017-06-08 ENCOUNTER — Telehealth: Payer: Self-pay | Admitting: Internal Medicine

## 2017-06-08 NOTE — Telephone Encounter (Signed)
Spoke with pt regarding AWV. Pt stated that she is not interested in scheduling appt for this year, but she will call office next year to schedule appt.

## 2017-06-10 ENCOUNTER — Telehealth: Payer: Self-pay | Admitting: *Deleted

## 2017-06-10 NOTE — Telephone Encounter (Signed)
Received Provider Query from [facility] for Medical Record Clarification on Depression for Coding purposes, OV note attached; forwarded to provider/SLS 09/27

## 2017-06-14 NOTE — Telephone Encounter (Signed)
Form completed and faxed to [facility] at [phone]. Form sent for scanning.

## 2017-06-17 ENCOUNTER — Other Ambulatory Visit: Payer: Self-pay | Admitting: Internal Medicine

## 2017-07-16 ENCOUNTER — Other Ambulatory Visit: Payer: Self-pay | Admitting: Internal Medicine

## 2017-09-14 DIAGNOSIS — C801 Malignant (primary) neoplasm, unspecified: Secondary | ICD-10-CM

## 2017-09-14 HISTORY — DX: Malignant (primary) neoplasm, unspecified: C80.1

## 2017-09-16 ENCOUNTER — Encounter: Payer: Self-pay | Admitting: Internal Medicine

## 2017-09-16 ENCOUNTER — Ambulatory Visit: Payer: Medicare Other | Admitting: Internal Medicine

## 2017-09-16 VITALS — BP 138/76 | HR 76 | Temp 97.6°F | Resp 14 | Ht 67.0 in | Wt 184.2 lb

## 2017-09-16 DIAGNOSIS — I10 Essential (primary) hypertension: Secondary | ICD-10-CM

## 2017-09-16 DIAGNOSIS — R7989 Other specified abnormal findings of blood chemistry: Secondary | ICD-10-CM

## 2017-09-16 DIAGNOSIS — F329 Major depressive disorder, single episode, unspecified: Secondary | ICD-10-CM

## 2017-09-16 DIAGNOSIS — E785 Hyperlipidemia, unspecified: Secondary | ICD-10-CM | POA: Diagnosis not present

## 2017-09-16 DIAGNOSIS — F32A Depression, unspecified: Secondary | ICD-10-CM

## 2017-09-16 LAB — LIPID PANEL
CHOLESTEROL: 163 mg/dL (ref 0–200)
HDL: 49.3 mg/dL (ref >39.00)
LDL Cholesterol: 92 mg/dL (ref 0–99)
NonHDL: 113.4
Total CHOL/HDL Ratio: 3
Triglycerides: 106 mg/dL (ref 0.0–149.0)
VLDL: 21.2 mg/dL (ref 0.0–40.0)

## 2017-09-16 LAB — BASIC METABOLIC PANEL
BUN: 25 mg/dL — ABNORMAL HIGH (ref 6–23)
CO2: 29 mEq/L (ref 19–32)
Calcium: 9.8 mg/dL (ref 8.4–10.5)
Chloride: 105 mEq/L (ref 96–112)
Creatinine, Ser: 0.89 mg/dL (ref 0.40–1.20)
GFR: 64.94 mL/min (ref >60.00)
Glucose, Bld: 110 mg/dL — ABNORMAL HIGH (ref 70–99)
POTASSIUM: 4 meq/L (ref 3.5–5.1)
SODIUM: 144 meq/L (ref 135–145)

## 2017-09-16 LAB — TSH: TSH: 2.83 u[IU]/mL (ref 0.35–4.50)

## 2017-09-16 MED ORDER — VENLAFAXINE HCL ER 37.5 MG PO CP24: 75.0000 mg | ORAL_CAPSULE | Freq: Every day | ORAL | 1 refills | Status: DC

## 2017-09-16 NOTE — Progress Notes (Signed)
Subjective:    Patient ID: [name], female    DOB: [birth year], 82 y.o.   MRN: [id number]  DOS:  09/16/2017 Type of visit - description : rov Interval history: Her CC today is stress. Her husband has dementia, he is having behavioral issues. He has not turn violent or aggressive but he is stubborn and likes to simply sit and watching TV. She has a support group.   Review of Systems Denies suicidal ideas Does not feel threatened physically. No problems with sleeping. + stress, they moved from a big house to a small apartment in the last few weeks  Past Medical History:  Diagnosis Date  . Aortic stenosis    ECHO 5-20ll mild AS but no evidence of HCM or MR--no further testing suggested   . Blood transfusion without reported diagnosis 1971   after hysterectomy  . Depression   . GERD (gastroesophageal reflux disease)   . History of gastroesophageal reflux (GERD)   . Hx of adenomatous colonic polyps   . Hx of cardiac cath 2006   neg  . Hypertension   . Mixed urge and stress incontinence   . OAB (overactive bladder) 07/13/2012  . Osteoarthritis   . Osteopenia    rx fosamax 08-2013  . Personal history of colonic polyps - adenomas 04/19/2014    Past Surgical History:  Procedure Laterality Date  . ABDOMINAL HYSTERECTOMY  1971  . APPENDECTOMY  1971   (at time of hyst?)  . BLADDER SURGERY  2003  . CATARACT EXTRACTION, BILATERAL    . CHOLECYSTECTOMY    . OOPHORECTOMY  1971  . ROTATOR CUFF REPAIR Right   . TOE SURGERY  1995   R 2nd --- correction of claw toe     Social History   Socioeconomic History  . Marital status: Married    Spouse name: Not on file  . Number of children: 2  . Years of education: Not on file  . Highest education level: Not on file  Social Needs  . Financial resource strain: Not on file  . Food insecurity - worry: Not on file  . Food insecurity - inability: Not on file  . Transportation needs - medical: Not on file  . Transportation needs -  non-medical: Not on file  Occupational History  . Occupation: stays at home     Employer: RETIRED  Tobacco Use  . Smoking status: Never Smoker  . Smokeless tobacco: Never Used  Substance and Sexual Activity  . Alcohol use: Yes    Alcohol/week: 0.0 oz    Comment: wine rarely  . Drug use: No  . Sexual activity: Not on file  Other Topics Concern  . Not on file  Social History Narrative   Household: pt and husband, he has dementia   Moved to small apartment 07-2017   2 children, Elta Guadeloupe (airforce), daughter Darnelle Bos ([place] [employer]), she is very supportive      Allergies as of 09/16/2017   No Known Allergies     Medication List        Accurate as of 09/16/17 11:59 PM. Always use your most recent med list.          alendronate 70 MG tablet Commonly known as:  FOSAMAX Take 1 tablet (70 mg total) by mouth every 7 (seven) days. Take with a full glass of water on an empty stomach.   aspirin 81 MG tablet Take 81 mg by mouth daily.   B-complex with vitamin C tablet Take  1 tablet by mouth daily.   Cholecalciferol 1000 units capsule Take 1,000 Units by mouth daily.   FISH OIL PO Take by mouth.   GERITOL PO Take by mouth daily.   NIFEdipine 60 MG 24 hr tablet Commonly known as:  PROCARDIA XL/ADALAT-CC Take 1 tablet (60 mg total) by mouth daily.   OCUVITE PO Take by mouth.   omeprazole 20 MG capsule Commonly known as:  PRILOSEC Take 1 capsule (20 mg total) by mouth 2 (two) times daily before a meal.   oxybutynin 5 MG tablet Commonly known as:  DITROPAN Take 1 tablet (5 mg total) by mouth 3 (three) times daily.   simvastatin 20 MG tablet Commonly known as:  ZOCOR Take 1 tablet (20 mg total) by mouth daily with breakfast.   venlafaxine XR 37.5 MG 24 hr capsule Commonly known as:  EFFEXOR-XR Take 2 capsules (75 mg total) by mouth daily with breakfast.   VITAMIN C PO Take by mouth daily.   vitamin E 600 UNIT capsule Take 600 Units by mouth daily.            Objective:   Physical Exam BP 138/76 (BP Location: Left Arm, Patient Position: Sitting, Cuff Size: Small)   Pulse 76   Temp 97.6 F (36.4 C) (Oral)   Resp 14   Ht 5\' 7"  (1.702 m)   Wt 184 lb 4 oz (83.6 kg)   SpO2 96%   BMI 28.86 kg/m  General:   Well developed, well nourished.  + Emotional stress.  HEENT:  Normocephalic . Face symmetric, atraumatic Skin: Not pale. Not jaundice Neurologic:  alert & oriented X3.  Speech normal, gait appropriate for age and unassisted Psych--  Cognition and judgment appear intact.  Cooperative with normal attention span and concentration.  Behavior appropriate. Tearful on and off during the office visit..      Assessment & Plan:    Assessment HTN Hyperlipidemia  GERD Depression Osteopenia, t score 2008 -1.8, 2011 -1.8, 2014 -2.1: Fosamax Rx 08-2013 t score 03-2016: -2.1. H/o vit d def  DJD Aortic stenosis ( at some point diagnosed with HOCM) ---echo 01-2010: "Mild AS but no evidence of HCM or MR. ---Echo 6-15 mild AoS. Overactive bladder- pcp started to rx meds 09-2016  Ao [place]: wnl 02-2015  PLAN: Depression: This was the main topic during the visit, related to her husband's dementia, she is the caregiver.  He has some behavioral issues, fortunately he has not gotten violent or aggressive.  He is my patient, we talk about possibly medication for him. She is counseled to the best of my ability, tips provided, recommend to continue going to a support group. Wonders if she could increase antidepressant, increase Effexor to 2 tablets daily, Rx sent. HTN: Continue Procardia, check a BMP High cholesterol: Continue simvastatin, check a FLP Last TSH is slightly increased: Check TSH. RTC 3-4 months, sooner if needed.  Today, I spent more than  25  min with the patient: >50% of the time counseling regards stress management, regard helping her husband who has dementia.  Multiple questions answer

## 2017-09-16 NOTE — Progress Notes (Signed)
Pre visit review using our clinic review tool, if applicable. No additional management support is needed unless otherwise documented below in the visit note. 

## 2017-09-16 NOTE — Patient Instructions (Signed)
GO TO THE LAB : Get the blood work     GO TO THE FRONT DESK Schedule your next appointment for a  Check up in 3-4 months, sooner if needed

## 2017-09-17 NOTE — Assessment & Plan Note (Signed)
Depression: This was the main topic during the visit, related to her husband's dementia, she is the caregiver.  He has some behavioral issues, fortunately he has not gotten violent or aggressive.  He is my patient, we talk about possibly medication for him. She is counseled to the best of my ability, tips provided, recommend to continue going to a support group. Wonders if she could increase antidepressant, increase Effexor to 2 tablets daily, Rx sent. HTN: Continue Procardia, check a BMP High cholesterol: Continue simvastatin, check a FLP Last TSH is slightly increased: Check TSH. RTC 3-4 months, sooner if needed.

## 2017-09-24 DIAGNOSIS — H04123 Dry eye syndrome of bilateral lacrimal glands: Secondary | ICD-10-CM | POA: Diagnosis not present

## 2017-10-14 DIAGNOSIS — C4402 Squamous cell carcinoma of skin of lip: Secondary | ICD-10-CM | POA: Diagnosis not present

## 2017-10-14 DIAGNOSIS — L821 Other seborrheic keratosis: Secondary | ICD-10-CM | POA: Diagnosis not present

## 2017-10-14 DIAGNOSIS — L578 Other skin changes due to chronic exposure to nonionizing radiation: Secondary | ICD-10-CM | POA: Diagnosis not present

## 2017-10-14 DIAGNOSIS — D045 Carcinoma in situ of skin of trunk: Secondary | ICD-10-CM | POA: Diagnosis not present

## 2017-10-14 DIAGNOSIS — L570 Actinic keratosis: Secondary | ICD-10-CM | POA: Diagnosis not present

## 2017-10-15 ENCOUNTER — Other Ambulatory Visit: Payer: Self-pay | Admitting: Internal Medicine

## 2017-10-21 DIAGNOSIS — C44519 Basal cell carcinoma of skin of other part of trunk: Secondary | ICD-10-CM | POA: Diagnosis not present

## 2017-10-21 DIAGNOSIS — C4401 Basal cell carcinoma of skin of lip: Secondary | ICD-10-CM | POA: Diagnosis not present

## 2017-11-25 DIAGNOSIS — C44529 Squamous cell carcinoma of skin of other part of trunk: Secondary | ICD-10-CM | POA: Diagnosis not present

## 2017-12-05 ENCOUNTER — Other Ambulatory Visit: Payer: Self-pay | Admitting: Internal Medicine

## 2017-12-09 DIAGNOSIS — C44529 Squamous cell carcinoma of skin of other part of trunk: Secondary | ICD-10-CM | POA: Diagnosis not present

## 2018-01-05 ENCOUNTER — Ambulatory Visit (INDEPENDENT_AMBULATORY_CARE_PROVIDER_SITE_OTHER): Payer: Medicare Other | Admitting: Internal Medicine

## 2018-01-05 ENCOUNTER — Encounter: Payer: Self-pay | Admitting: Internal Medicine

## 2018-01-05 VITALS — BP 126/78 | HR 74 | Temp 97.6°F | Resp 14 | Ht 67.0 in | Wt 178.1 lb

## 2018-01-05 DIAGNOSIS — F329 Major depressive disorder, single episode, unspecified: Secondary | ICD-10-CM | POA: Diagnosis not present

## 2018-01-05 DIAGNOSIS — F32A Depression, unspecified: Secondary | ICD-10-CM

## 2018-01-05 NOTE — Patient Instructions (Addendum)
GO TO THE FRONT DESK Schedule your next appointment for a yearly checkup in 4 months   CARING FOR THE MEMORY IMPAIRED   The 36-Hour Day a book by Eda Keys and Collier Salina Rabins is a detailed self-help guide for people caring for loved ones with Alzheimer's disease, dementia, and other memory impairments.  Web Resources : [employer]  http://rojas.com/  Local Resource: [facility] [place] [email or web address]

## 2018-01-05 NOTE — Progress Notes (Signed)
Subjective:    Patient ID: [name], female    DOB: 04-Oct-1935, 82 y.o.   MRN: [id number]  DOS:  01/05/2018 Type of visit - description : rov Interval history: Follow-up from previous visit, she was having depression mostly due to taking care of her husband with dementia. Effexor was increased. Good compliance, good tolerance, feeling overall better. Her husband's health continues to deteriorate. From time to time he is "hateful" but not aggressive physically.  Review of Systems Denies nausea, vomiting, constipation. Sleeps well.   Past Medical History:  Diagnosis Date  . Aortic stenosis    ECHO 5-20ll mild AS but no evidence of HCM or MR--no further testing suggested   . Blood transfusion without reported diagnosis 1971   after hysterectomy  . Cancer ([place]) 2019   squamous cell carcinoma- on chest  . Depression   . GERD (gastroesophageal reflux disease)   . History of gastroesophageal reflux (GERD)   . Hx of adenomatous colonic polyps   . Hx of cardiac cath 2006   neg  . Hypertension   . Mixed urge and stress incontinence   . OAB (overactive bladder) 07/13/2012  . Osteoarthritis   . Osteopenia    rx fosamax 08-2013  . Personal history of colonic polyps - adenomas 04/19/2014    Past Surgical History:  Procedure Laterality Date  . ABDOMINAL HYSTERECTOMY  1971  . APPENDECTOMY  1971   (at time of hyst?)  . BLADDER SURGERY  2003  . CATARACT EXTRACTION, BILATERAL    . CHOLECYSTECTOMY    . OOPHORECTOMY  1971  . ROTATOR CUFF REPAIR Right   . TOE SURGERY  1995   R 2nd --- correction of claw toe     Social History   Socioeconomic History  . Marital status: Married    Spouse name: Not on file  . Number of children: 2  . Years of education: Not on file  . Highest education level: Not on file  Occupational History  . Occupation: stays at home     Employer: RETIRED  Social Needs  . Financial resource strain: Not on file  . Food insecurity:    Worry: Not on  file    Inability: Not on file  . Transportation needs:    Medical: Not on file    Non-medical: Not on file  Tobacco Use  . Smoking status: Never Smoker  . Smokeless tobacco: Never Used  Substance and Sexual Activity  . Alcohol use: Yes    Alcohol/week: 0.0 oz    Comment: wine rarely  . Drug use: No  . Sexual activity: Not on file  Lifestyle  . Physical activity:    Days per week: Not on file    Minutes per session: Not on file  . Stress: Not on file  Relationships  . Social connections:    Talks on phone: Not on file    Gets together: Not on file    Attends religious service: Not on file    Active member of club or organization: Not on file    Attends meetings of clubs or organizations: Not on file    Relationship status: Not on file  . Intimate partner violence:    Fear of current or ex partner: Not on file    Emotionally abused: Not on file    Physically abused: Not on file    Forced sexual activity: Not on file  Other Topics Concern  . Not on file  Social History Narrative  Household: pt and husband, he has dementia   Moved to small apartment 07-2017   2 children, Elta Guadeloupe (airforce), daughter Darnelle Bos ([place] [employer]), she is very supportive      Allergies as of 01/05/2018   No Known Allergies     Medication List        Accurate as of 01/05/18  4:01 PM. Always use your most recent med list.          alendronate 70 MG tablet Commonly known as:  FOSAMAX Take 1 tablet (70 mg total) by mouth every 7 (seven) days. Take with a full glass of water on an empty stomach.   aspirin 81 MG tablet Take 81 mg by mouth daily.   B-complex with vitamin C tablet Take 1 tablet by mouth daily.   Cholecalciferol 1000 units capsule Take 1,000 Units by mouth daily.   FISH OIL PO Take by mouth.   GERITOL PO Take by mouth daily.   NIFEdipine 60 MG 24 hr tablet Commonly known as:  PROCARDIA XL/ADALAT-CC Take 1 tablet (60 mg total) by mouth daily.   OCUVITE PO Take by  mouth.   omeprazole 20 MG capsule Commonly known as:  PRILOSEC Take 1 capsule (20 mg total) by mouth 2 (two) times daily before a meal.   oxybutynin 5 MG tablet Commonly known as:  DITROPAN Take 1 tablet (5 mg total) by mouth 3 (three) times daily.   simvastatin 20 MG tablet Commonly known as:  ZOCOR Take 1 tablet (20 mg total) by mouth daily.   venlafaxine XR 37.5 MG 24 hr capsule Commonly known as:  EFFEXOR-XR Take 1 capsule (37.5 mg total) by mouth daily with breakfast.   VITAMIN C PO Take by mouth daily.   vitamin E 600 UNIT capsule Take 600 Units by mouth daily.          Objective:   Physical Exam BP 126/78 (BP Location: Left Arm, Patient Position: Sitting, Cuff Size: Normal)   Pulse 74   Temp 97.6 F (36.4 C) (Oral)   Resp 14   Ht 5\' 7"  (1.702 m)   Wt 178 lb 2 oz (80.8 kg)   SpO2 97%   BMI 27.90 kg/m  General:   Well developed, well nourished . NAD.  HEENT:  Normocephalic . Face symmetric, atraumatic  Neurologic:  alert & oriented X3.  Speech normal, gait appropriate for age and unassisted Psych--  Cognition and judgment appear intact.  Cooperative with normal attention span and concentration.  Behavior appropriate. No anxious or depressed appearing.      Assessment & Plan:    Assessment HTN Hyperlipidemia  GERD Depression Osteopenia, t score 2008 -1.8, 2011 -1.8, 2014 -2.1: Fosamax Rx 08-2013 t score 03-2016: -2.1. H/o vit d def  DJD Aortic stenosis ( at some point diagnosed with HOCM) ---echo 01-2010: "Mild AS but no evidence of HCM or MR. ---Echo 6-15 mild AoS. Overactive bladder- pcp started to rx meds 09-2016  Ao [place]: wnl 02-2015  PLAN: Depression: See last visit, related to her husband's dementia, Effexor dose increased, she is feeling better.  We talked about caregiver burnout, pt feels like she could go down that path.  Prevention discussed, is important for her to get help and save some time for herself.  See AVS. RTC 4  months  Today, I spent more than 15  min with the patient: >50% of the time counseling regards prevention of caregiver burnout, counseling regards to depression and her husband's illness.  Listening therapy.

## 2018-01-05 NOTE — Assessment & Plan Note (Signed)
Depression: See last visit, related to her husband's dementia, Effexor dose increased, she is feeling better.  We talked about caregiver burnout, pt feels like she could go down that path.  Prevention discussed, is important for her to get help and save some time for herself.  See AVS. RTC 4 months

## 2018-01-05 NOTE — Progress Notes (Signed)
Pre visit review using our clinic review tool, if applicable. No additional management support is needed unless otherwise documented below in the visit note. 

## 2018-01-19 DIAGNOSIS — L570 Actinic keratosis: Secondary | ICD-10-CM | POA: Diagnosis not present

## 2018-02-02 ENCOUNTER — Other Ambulatory Visit: Payer: Self-pay | Admitting: Internal Medicine

## 2018-03-29 ENCOUNTER — Other Ambulatory Visit: Payer: Self-pay | Admitting: Internal Medicine

## 2018-05-09 ENCOUNTER — Ambulatory Visit (INDEPENDENT_AMBULATORY_CARE_PROVIDER_SITE_OTHER): Payer: Medicare Other | Admitting: Internal Medicine

## 2018-05-09 ENCOUNTER — Encounter: Payer: Self-pay | Admitting: Internal Medicine

## 2018-05-09 VITALS — BP 130/82 | HR 91 | Temp 98.8°F | Resp 16 | Ht 67.0 in | Wt 185.0 lb

## 2018-05-09 DIAGNOSIS — I10 Essential (primary) hypertension: Secondary | ICD-10-CM | POA: Diagnosis not present

## 2018-05-09 DIAGNOSIS — F329 Major depressive disorder, single episode, unspecified: Secondary | ICD-10-CM | POA: Diagnosis not present

## 2018-05-09 DIAGNOSIS — F32A Depression, unspecified: Secondary | ICD-10-CM

## 2018-05-09 DIAGNOSIS — R269 Unspecified abnormalities of gait and mobility: Secondary | ICD-10-CM

## 2018-05-09 DIAGNOSIS — E785 Hyperlipidemia, unspecified: Secondary | ICD-10-CM | POA: Diagnosis not present

## 2018-05-09 NOTE — Progress Notes (Signed)
Subjective:    Patient ID: [name], female    DOB: [birth year], 82 y.o.   MRN: [id number]  DOS:  05/09/2018 Type of visit - description : Follow-up Interval history: Since the last office visit, Effexor was increased.  She is feeling better. HTN: Good compliance with medication, BP today is very good She has some difficulty with her gait and takes care of her husband who also have some mobility issues.  Request a handicap sticker.   Review of Systems   Past Medical History:  Diagnosis Date  . Aortic stenosis    ECHO 5-20ll mild AS but no evidence of HCM or MR--no further testing suggested   . Blood transfusion without reported diagnosis 1971   after hysterectomy  . 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 Depression   . GERD (gastroesophageal reflux disease)   . History of gastroesophageal reflux (GERD)   . Hx of adenomatous colonic polyps   . Hx of cardiac cath 2006   neg  . Hypertension   . Mixed urge and stress incontinence   . OAB (overactive bladder) 07/13/2012  . Osteoarthritis   . Osteopenia    rx fosamax 08-2013  . Personal history of colonic polyps - adenomas 04/19/2014    Past Surgical History:  Procedure Laterality Date  . ABDOMINAL HYSTERECTOMY  1971  . APPENDECTOMY  1971   (at time of hyst?)  . BLADDER SURGERY  2003  . CATARACT EXTRACTION, BILATERAL    . CHOLECYSTECTOMY    . OOPHORECTOMY  1971  . ROTATOR CUFF REPAIR Right   . TOE SURGERY  1995   R 2nd --- correction of claw toe     Social History   Socioeconomic History  . Marital status: Married    Spouse name: Not on file  . Number of children: 2  . Years of education: Not on file  . Highest education level: Not on file  Occupational History  . Occupation: stays at home     Employer: RETIRED  Social Needs  . Financial resource strain: Not on file  . Food insecurity:    Worry: Not on file    Inability: Not on file  . Transportation needs:    Medical: Not on file    Non-medical: Not on file  Tobacco Use  . Smoking status: Never Smoker  . Smokeless tobacco: Never Used  Substance and Sexual Activity  . Alcohol use: Yes    Alcohol/week: 0.0 standard drinks    Comment: wine rarely  . Drug use: No  . Sexual activity: Not on file  Lifestyle  . Physical activity:    Days per week: Not on file    Minutes per session: Not on file  . Stress: Not on file  Relationships  . Social connections:    Talks on phone: Not on file    Gets together: Not on file    Attends religious service: Not on file    Active member of club or organization: Not on file    Attends meetings of clubs or organizations: Not on file    Relationship status: Not on file  . Intimate partner violence:    Fear of current or ex partner: Not on file    Emotionally abused: Not on file    Physically abused: Not on file    Forced sexual activity: Not on file  Other Topics Concern  . Not on file  Social History Narrative   Household:  pt and husband, he has dementia   Moved to small apartment 07-2017   2 children, Elta Guadeloupe (airforce), daughter Darnelle Bos ([employer] police), she is very supportive      Allergies as of 05/09/2018   No Known Allergies     Medication List        Accurate as of 05/09/18 11:46 AM. Always use your most recent med list.          alendronate 70 MG tablet Commonly known as:  FOSAMAX Take 1 tablet (70 mg total) by mouth every 7 (seven) days. Take with a full glass of water on an empty stomach.   aspirin 81 MG tablet Take 81 mg by mouth daily.   B-complex with vitamin C tablet Take 1 tablet by mouth daily.   Cholecalciferol 1000 units capsule Take 1,000 Units by mouth daily.   FISH OIL PO Take by mouth.   GERITOL PO Take by mouth daily.   NIFEdipine 60 MG 24 hr tablet Commonly known as:  PROCARDIA XL/ADALAT-CC Take 1 tablet (60 mg total) by mouth daily.   OCUVITE PO Take by mouth.   omeprazole 20 MG capsule Commonly known as:  PRILOSEC Take 1  capsule (20 mg total) by mouth 2 (two) times daily before a meal.   oxybutynin 5 MG tablet Commonly known as:  DITROPAN Take 1 tablet (5 mg total) by mouth 3 (three) times daily.   simvastatin 20 MG tablet Commonly known as:  ZOCOR Take 1 tablet (20 mg total) by mouth daily.   venlafaxine XR 37.5 MG 24 hr capsule Commonly known as:  EFFEXOR-XR Take 1 capsule (37.5 mg total) by mouth daily with breakfast.   VITAMIN C PO Take by mouth daily.   vitamin E 600 UNIT capsule Take 600 Units by mouth daily.          Objective:   Physical Exam BP 130/82 (BP Location: Left Arm, Patient Position: Sitting, Cuff Size: Small)   Pulse 91   Temp 98.8 F (37.1 C) (Oral)   Resp 16   Ht 5\' 7"  (1.702 m)   Wt 185 lb (83.9 kg)   SpO2 97%   BMI 28.98 kg/m  General:   Well developed, NAD, see BMI.  HEENT:  Normocephalic . Face symmetric, atraumatic Lungs:  CTA B Normal respiratory effort, no intercostal retractions, no accessory muscle use. Heart: RRR,  no murmur.  No pretibial edema bilaterally  Skin: Not pale. Not jaundice Neurologic:  alert & oriented X3.  Speech normal, gait appropriate for age and unassisted Psych--  Cognition and judgment appear intact.  Cooperative with normal attention span and concentration.  Behavior appropriate. No anxious or depressed appearing.      Assessment & Plan:   Assessment HTN Hyperlipidemia  GERD Depression Osteopenia, t score 2008 -1.8, 2011 -1.8, 2014 -2.1: Fosamax Rx 08-2013 t score 03-2016: -2.1. H/o vit d def  DJD Aortic stenosis ( at some point diagnosed with HOCM) ---echo 01-2010: "Mild AS but no evidence of HCM or MR. ---Echo 6-15 mild AoS. Overactive bladder- pcp started to rx meds 09-2016  Ao [place]: wnl 02-2015  PLAN: HTN: Continue Procardia, last BMP satisfactory.  BP today is very good Hyperlipidemia: Continue simvastatin.  Last FLP satisfactory Depression: See last visit, Effexor dose increased, good  compliance/tolerance, overall feels better.  She is attending group meetings for other people whose family has dementia and that seems to be helpful.  Recommend "the 36-hour day" book. Mobility issues: She has some issues of  herself and also her husband, handicap parking permit signed it. RTC 3 to 4 months, physical exam

## 2018-05-09 NOTE — Patient Instructions (Addendum)
  GO TO THE FRONT DESK Schedule your next appointment for a  Physical exam in 3-4 months    CARING FOR THE MEMORY IMPAIRED   The 36-Hour Day a book by Eda Keys and Collier Salina Rabins is a detailed self-help guide for people caring for loved ones with Alzheimer's disease, dementia, and other memory impairments.

## 2018-05-10 NOTE — Assessment & Plan Note (Signed)
HTN: Continue Procardia, last BMP satisfactory.  BP today is very good Hyperlipidemia: Continue simvastatin.  Last FLP satisfactory Depression: See last visit, Effexor dose increased, good compliance/tolerance, overall feels better.  She is attending group meetings for other people whose family has dementia and that seems to be helpful.  Recommend "the 36-hour day" book. Mobility issues: She has some issues of herself and also her husband, handicap parking permit signed it. RTC 3 to 4 months, physical exam

## 2018-06-16 ENCOUNTER — Other Ambulatory Visit: Payer: Self-pay | Admitting: Internal Medicine

## 2018-07-26 ENCOUNTER — Ambulatory Visit (INDEPENDENT_AMBULATORY_CARE_PROVIDER_SITE_OTHER): Payer: Medicare Other

## 2018-07-26 DIAGNOSIS — Z23 Encounter for immunization: Secondary | ICD-10-CM

## 2018-07-26 NOTE — Progress Notes (Signed)
Pre visit review using our clinic review tool, if applicable. No additional management support is needed unless otherwise documented below in the visit note.  Pt here today with her husband, Konrad Dolores, she requested flu vaccination. 0.68mL injected into L deltoid. Pt tolerated injection well.

## 2018-08-18 ENCOUNTER — Other Ambulatory Visit: Payer: Self-pay

## 2018-08-18 MED ORDER — VENLAFAXINE HCL ER 37.5 MG PO CP24: 37.5000 mg | ORAL_CAPSULE | Freq: Every day | ORAL | 1 refills | Status: DC

## 2018-08-25 ENCOUNTER — Encounter: Payer: Medicare Other | Admitting: Internal Medicine

## 2018-08-25 DIAGNOSIS — Z0289 Encounter for other administrative examinations: Secondary | ICD-10-CM

## 2018-11-15 DIAGNOSIS — L570 Actinic keratosis: Secondary | ICD-10-CM | POA: Diagnosis not present

## 2018-11-15 DIAGNOSIS — C44529 Squamous cell carcinoma of skin of other part of trunk: Secondary | ICD-10-CM | POA: Diagnosis not present

## 2018-11-15 DIAGNOSIS — L578 Other skin changes due to chronic exposure to nonionizing radiation: Secondary | ICD-10-CM | POA: Diagnosis not present

## 2018-11-15 DIAGNOSIS — L821 Other seborrheic keratosis: Secondary | ICD-10-CM | POA: Diagnosis not present

## 2018-11-15 DIAGNOSIS — D2239 Melanocytic nevi of other parts of face: Secondary | ICD-10-CM | POA: Diagnosis not present

## 2018-12-01 ENCOUNTER — Ambulatory Visit: Payer: Self-pay | Admitting: *Deleted

## 2018-12-18 ENCOUNTER — Other Ambulatory Visit: Payer: Self-pay | Admitting: Internal Medicine

## 2018-12-19 ENCOUNTER — Telehealth: Payer: Self-pay

## 2018-12-19 NOTE — Telephone Encounter (Signed)
Patient called in office to sat changing Medicare wellness visit to June will be fine. Forwarded to wellness nurse.

## 2019-01-09 ENCOUNTER — Ambulatory Visit: Payer: Self-pay | Admitting: *Deleted

## 2019-01-17 DIAGNOSIS — S8264XA Nondisplaced fracture of lateral malleolus of right fibula, initial encounter for closed fracture: Secondary | ICD-10-CM | POA: Diagnosis not present

## 2019-01-17 DIAGNOSIS — M79671 Pain in right foot: Secondary | ICD-10-CM | POA: Diagnosis not present

## 2019-01-17 DIAGNOSIS — M25571 Pain in right ankle and joints of right foot: Secondary | ICD-10-CM | POA: Diagnosis not present

## 2019-01-28 ENCOUNTER — Other Ambulatory Visit: Payer: Self-pay | Admitting: Internal Medicine

## 2019-02-14 DIAGNOSIS — S8264XD Nondisplaced fracture of lateral malleolus of right fibula, subsequent encounter for closed fracture with routine healing: Secondary | ICD-10-CM | POA: Diagnosis not present

## 2019-03-06 NOTE — Progress Notes (Addendum)
Virtual Visit via Video Note  I connected with patient on 03/07/19 at  2:00 PM EDT by audio enabled telemedicine application and verified that I am speaking with the correct person using two identifiers.   THIS ENCOUNTER IS A VIRTUAL VISIT DUE TO COVID-19 - PATIENT WAS NOT SEEN IN THE OFFICE. PATIENT HAS CONSENTED TO VIRTUAL VISIT / TELEMEDICINE VISIT   Location of patient: home  Location of provider: office  I discussed the limitations of evaluation and management by telemedicine and the availability of in person appointments. The patient expressed understanding and agreed to proceed.   Subjective:   [name] is a 83 y.o. female who presents for Medicare Annual (Subsequent) preventive examination.  Review of Systems: No ROS.  Medicare Wellness Virtual Visit.  Visual/audio telehealth visit, UTA vital signs.   See social history for additional risk factors. Cardiac Risk Factors include: advanced age (>34men, >60 women);dyslipidemia;hypertension Sleep patterns: no issues. Home Safety/Smoke Alarms: Feels safe in home. Smoke alarms in place.  Lives alone. Husband in nursing home. 1 story condo. 4 steps going in with hand rail. Step over tub.  Female:   Mammo- declines      Dexa scan- declines     Objective:     Advanced Directives 03/07/2019 04/05/2014  Does Patient Have a Medical Advance Directive? Yes Patient has advance directive, copy not in chart  Type of Advance Directive Healthcare Power of [place]  Does patient want to make changes to medical advance directive? No - Patient declined -  Copy of [place] in Chart? No - copy requested -    Tobacco Social History   Tobacco Use  Smoking Status Never Smoker  Smokeless Tobacco Never Used     Counseling given: Not Answered   Clinical Intake: Pain : No/denies pain   Past Medical History:  Diagnosis Date  . Aortic stenosis    ECHO 5-20ll mild AS but  no evidence of HCM or MR--no further testing suggested   . Blood transfusion without reported diagnosis 1971   after hysterectomy  . Cancer ([place]) 2019   squamous cell carcinoma- on chest  . Depression   . GERD (gastroesophageal reflux disease)   . History of gastroesophageal reflux (GERD)   . Hx of adenomatous colonic polyps   . Hx of cardiac cath 2006   neg  . Hypertension   . Mixed urge and stress incontinence   . OAB (overactive bladder) 07/13/2012  . Osteoarthritis   . Osteopenia    rx fosamax 08-2013  . Personal history of colonic polyps - adenomas 04/19/2014   Past Surgical History:  Procedure Laterality Date  . ABDOMINAL HYSTERECTOMY  1971  . APPENDECTOMY  1971   (at time of hyst?)  . BLADDER SURGERY  2003  . CATARACT EXTRACTION, BILATERAL    . CHOLECYSTECTOMY    . OOPHORECTOMY  1971  . ROTATOR CUFF REPAIR Right   . TOE SURGERY  1995   R 2nd --- correction of claw toe    Family History  Problem Relation Age of Onset  . Coronary artery disease Father 41       had CABG  . Cancer Father        gallbladder  . Stroke Mother 72  . Ovarian cancer Sister 91  . Colon cancer Neg Hx        NEGATIVE  . Breast cancer Neg Hx        NEGATIVE   Social  History   Socioeconomic History  . Marital status: Married    Spouse name: Not on file  . Number of children: 2  . Years of education: Not on file  . Highest education level: Not on file  Occupational History  . Occupation: stays at home     Employer: RETIRED  Social Needs  . Financial resource strain: Not on file  . Food insecurity    Worry: Not on file    Inability: Not on file  . Transportation needs    Medical: Not on file    Non-medical: Not on file  Tobacco Use  . Smoking status: Never Smoker  . Smokeless tobacco: Never Used  Substance and Sexual Activity  . Alcohol use: Yes    Alcohol/week: 0.0 standard drinks    Comment: wine rarely  . Drug use: No  . Sexual activity: Not Currently  Lifestyle  .  Physical activity    Days per week: Not on file    Minutes per session: Not on file  . Stress: Not on file  Relationships  . Social Herbalist on phone: Not on file    Gets together: Not on file    Attends religious service: Not on file    Active member of club or organization: Not on file    Attends meetings of clubs or organizations: Not on file    Relationship status: Not on file  Other Topics Concern  . Not on file  Social History Narrative   Household: pt and husband, he has dementia   Moved to small apartment 07-2017   2 children, Elta Guadeloupe (airforce), daughter Darnelle Bos ([employer] police), she is very supportive    Outpatient Encounter Medications as of 03/07/2019  Medication Sig  . alendronate (FOSAMAX) 70 MG tablet Take 1 tablet (70 mg total) by mouth every 7 (seven) days. Take with a full glass of water on an empty stomach.  . Ascorbic Acid (VITAMIN C PO) Take by mouth daily.  Marland Kitchen aspirin 81 MG tablet Take 81 mg by mouth daily.   . B Complex-C (B-COMPLEX WITH VITAMIN C) tablet Take 1 tablet by mouth daily.    . Cholecalciferol (VITAMIN D3) 1000 UNITS capsule Take 1,000 Units by mouth daily.    . Iron-Vitamins (GERITOL PO) Take by mouth daily.  . Multiple Vitamins-Minerals (OCUVITE PO) Take by mouth.    Marland Kitchen NIFEdipine (PROCARDIA XL/NIFEDICAL XL) 60 MG 24 hr tablet Take 1 tablet (60 mg total) by mouth daily.  . Omega-3 Fatty Acids (FISH OIL PO) Take by mouth.    Marland Kitchen omeprazole (PRILOSEC) 20 MG capsule Take 1 capsule (20 mg total) by mouth 2 (two) times daily before a meal.  . oxybutynin (DITROPAN) 5 MG tablet Take 1 tablet (5 mg total) by mouth 3 (three) times daily.  . simvastatin (ZOCOR) 20 MG tablet Take 1 tablet (20 mg total) by mouth daily.  Marland Kitchen venlafaxine XR (EFFEXOR-XR) 37.5 MG 24 hr capsule Take 1 capsule (37.5 mg total) by mouth daily with breakfast.  . vitamin E 600 UNIT capsule Take 600 Units by mouth daily.     No facility-administered encounter medications on file as  of 03/07/2019.     Activities of Daily Living In your present state of health, do you have any difficulty performing the following activities: 03/07/2019  Hearing? N  Vision? N  Difficulty concentrating or making decisions? N  Walking or climbing stairs? N  Dressing or bathing? N  Doing errands, shopping? N  Preparing Food and eating ? N  Using the Toilet? N  In the past six months, have you accidently leaked urine? N  Do you have problems with loss of bowel control? N  Managing your Medications? N  Managing your Finances? N  Housekeeping or managing your Housekeeping? N  Some recent data might be hidden    Patient Care Team: Colon Branch, MD as PCP - General Festus Aloe, MD as Consulting Physician (Urology) Gatha Mayer, MD as Consulting Physician (Gastroenterology)    Assessment:   This is a routine wellness examination for Tahisha. Physical assessment deferred to PCP.  Exercise Activities and Dietary recommendations Current Exercise Habits: The patient does not participate in regular exercise at present, Exercise limited by: None identified Diet (meal preparation, eat out, water intake, caffeinated beverages, dairy products, fruits and vegetables): well balanced, on average, 2 meals per day   Goals   None     Fall Risk Fall Risk  03/07/2019 09/16/2017 09/16/2016 03/12/2016 02/19/2015  Falls in the past year? 1 No No No No  Number falls in past yr: 1 - - - -  Injury with Fall? 1 - - - -     Depression Screen PHQ 2/9 Scores 03/07/2019 09/16/2017 09/16/2016 03/12/2016  PHQ - 2 Score 0 0 0 0     Cognitive Function Ad8 score reviewed for issues:  Issues making decisions:no   Less interest in hobbies / activities:no  Repeats questions, stories (family complaining):no  Trouble using ordinary gadgets (microwave, computer, phone):no  Forgets the month or year: no  Mismanaging finances: no  Remembering appts:no  Daily problems with thinking and/or memory:no Ad8  score is=0         Immunization History  Administered Date(s) Administered  . Influenza Split 07/15/2011, 06/01/2012  . Influenza Whole 08/18/2007, 06/15/2008, 06/12/2009, 06/12/2010  . Influenza, High Dose Seasonal PF 07/26/2018  . Influenza,inj,Quad PF,6+ Mos 06/06/2013, 06/11/2014, 05/24/2015  . Influenza-Unspecified 08/14/2016  . Pneumococcal Conjugate-13 02/14/2014  . Pneumococcal Polysaccharide-23 09/15/2003, 11/21/2008  . Td 06/24/2007  . Zoster 08/17/2011   Screening Tests Health Maintenance  Topic Date Due  . TETANUS/TDAP  06/23/2017  . DEXA SCAN  03/16/2018  . INFLUENZA VACCINE  04/15/2019  . COLONOSCOPY  04/20/2019  . PNA vac Low Risk Adult  Completed       Plan:    Please schedule your next medicare wellness visit with me in 1 yr.  Continue to eat heart healthy diet (full of fruits, vegetables, whole grains, lean protein, water--limit salt, fat, and sugar intake) and increase physical activity as tolerated.  Continue doing brain stimulating activities (puzzles, reading, adult coloring books, staying active) to keep memory sharp.   Bring a copy of your living will and/or healthcare power of attorney to your next office visit.    I have personally reviewed and noted the following in the patient's chart:   . Medical and social history . Use of alcohol, tobacco or illicit drugs  . Current medications and supplements . Functional ability and status . Nutritional status . Physical activity . Advanced directives . List of other physicians . Hospitalizations, surgeries, and ER visits in previous 12 months . Vitals . Screenings to include cognitive, depression, and falls . Referrals and appointments  In addition, I have reviewed and discussed with patient certain preventive protocols, quality metrics, and best practice recommendations. A written personalized care plan for preventive services as well as general preventive health recommendations were provided to  patient.  Naaman Plummer [place], [place]  03/07/2019    [name], MD

## 2019-03-07 ENCOUNTER — Encounter: Payer: Self-pay | Admitting: *Deleted

## 2019-03-07 ENCOUNTER — Other Ambulatory Visit: Payer: Self-pay

## 2019-03-07 ENCOUNTER — Ambulatory Visit (INDEPENDENT_AMBULATORY_CARE_PROVIDER_SITE_OTHER): Payer: Medicare Other | Admitting: *Deleted

## 2019-03-07 DIAGNOSIS — Z0000 Encounter for general adult medical examination without abnormal findings: Secondary | ICD-10-CM

## 2019-03-07 NOTE — Patient Instructions (Addendum)
Please schedule your next medicare wellness visit with me in 1 yr.  Continue to eat heart healthy diet (full of fruits, vegetables, whole grains, lean protein, water--limit salt, fat, and sugar intake) and increase physical activity as tolerated.  Continue doing brain stimulating activities (puzzles, reading, adult coloring books, staying active) to keep memory sharp.   Bring a copy of your living will and/or healthcare power of attorney to your next office visit.   Ms. [name] , Thank you for taking time to come for your Medicare Wellness Visit. I appreciate your ongoing commitment to your health goals. Please review the following plan we discussed and let me know if I can assist you in the future.   These are the goals we discussed: Goals    . maintain health       This is a list of the screening recommended for you and due dates:  Health Maintenance  Topic Date Due  . Tetanus Vaccine  06/23/2017  . DEXA scan (bone density measurement)  03/16/2018  . Flu Shot  04/15/2019  . Colon Cancer Screening  04/20/2019  . Pneumonia vaccines  Completed    Health Maintenance After Age 81 After age 39, you are at a higher risk for certain long-term diseases and infections as well as injuries from falls. Falls are a major cause of broken bones and head injuries in people who are older than age 52. Getting regular preventive care can help to keep you healthy and well. Preventive care includes getting regular testing and making lifestyle changes as recommended by your health care provider. Talk with your health care provider about:  Which screenings and tests you should have. A screening is a test that checks for a disease when you have no symptoms.  A diet and exercise plan that is right for you. What should I know about screenings and tests to prevent falls? Screening and testing are the best ways to find a health problem early. Early diagnosis and treatment give you the best chance of managing  medical conditions that are common after age 18. Certain conditions and lifestyle choices may make you more likely to have a fall. Your health care provider may recommend:  Regular vision checks. Poor vision and conditions such as cataracts can make you more likely to have a fall. If you wear glasses, make sure to get your prescription updated if your vision changes.  Medicine review. Work with your health care provider to regularly review all of the medicines you are taking, including over-the-counter medicines. Ask your health care provider about any side effects that may make you more likely to have a fall. Tell your health care provider if any medicines that you take make you feel dizzy or sleepy.  Osteoporosis screening. Osteoporosis is a condition that causes the bones to get weaker. This can make the bones weak and cause them to break more easily.  Blood pressure screening. Blood pressure changes and medicines to control blood pressure can make you feel dizzy.  Strength and balance checks. Your health care provider may recommend certain tests to check your strength and balance while standing, walking, or changing positions.  Foot health exam. Foot pain and numbness, as well as not wearing proper footwear, can make you more likely to have a fall.  Depression screening. You may be more likely to have a fall if you have a fear of falling, feel emotionally low, or feel unable to do activities that you used to do.  Alcohol use  screening. Using too much alcohol can affect your balance and may make you more likely to have a fall. What actions can I take to lower my risk of falls? General instructions  Talk with your health care provider about your risks for falling. Tell your health care provider if: ? You fall. Be sure to tell your health care provider about all falls, even ones that seem minor. ? You feel dizzy, sleepy, or off-balance.  Take over-the-counter and prescription medicines only  as told by your health care provider. These include any supplements.  Eat a healthy diet and maintain a healthy weight. A healthy diet includes low-fat dairy products, low-fat (lean) meats, and fiber from whole grains, beans, and lots of fruits and vegetables. Home safety  Remove any tripping hazards, such as rugs, cords, and clutter.  Install safety equipment such as grab bars in bathrooms and safety rails on stairs.  Keep rooms and walkways well-lit. Activity   Follow a regular exercise program to stay fit. This will help you maintain your balance. Ask your health care provider what types of exercise are appropriate for you.  If you need a cane or walker, use it as recommended by your health care provider.  Wear supportive shoes that have nonskid soles. Lifestyle  Do not drink alcohol if your health care provider tells you not to drink.  If you drink alcohol, limit how much you have: ? 0-1 drink a day for women. ? 0-2 drinks a day for men.  Be aware of how much alcohol is in your drink. In the [place] one drink equals one typical bottle of beer (12 oz), one-half glass of wine (5 oz), or one shot of hard liquor (1 oz).  Do not use any products that contain nicotine or tobacco, such as cigarettes and e-cigarettes. If you need help quitting, ask your health care provider. Summary  Having a healthy lifestyle and getting preventive care can help to protect your health and wellness after age 105.  Screening and testing are the best way to find a health problem early and help you avoid having a fall. Early diagnosis and treatment give you the best chance for managing medical conditions that are more common for people who are older than age 43.  Falls are a major cause of broken bones and head injuries in people who are older than age 46. Take precautions to prevent a fall at home.  Work with your health care provider to learn what changes you can make to improve your health and wellness  and to prevent falls. This information is not intended to replace advice given to you by your health care provider. Make sure you discuss any questions you have with your health care provider. Document Released: 07/14/2017 Document Revised: 07/14/2017 Document Reviewed: 07/14/2017 Elsevier Interactive Patient Education  2019 [employer].

## 2019-03-28 DIAGNOSIS — S8264XD Nondisplaced fracture of lateral malleolus of right fibula, subsequent encounter for closed fracture with routine healing: Secondary | ICD-10-CM | POA: Diagnosis not present

## 2019-04-23 ENCOUNTER — Other Ambulatory Visit: Payer: Self-pay | Admitting: Internal Medicine

## 2019-05-11 ENCOUNTER — Other Ambulatory Visit: Payer: Self-pay

## 2019-05-12 ENCOUNTER — Ambulatory Visit (INDEPENDENT_AMBULATORY_CARE_PROVIDER_SITE_OTHER): Payer: Medicare Other | Admitting: Internal Medicine

## 2019-05-12 ENCOUNTER — Encounter: Payer: Self-pay | Admitting: Internal Medicine

## 2019-05-12 VITALS — BP 160/76 | HR 81 | Temp 97.4°F | Resp 16 | Ht 67.0 in | Wt 172.1 lb

## 2019-05-12 DIAGNOSIS — I350 Nonrheumatic aortic (valve) stenosis: Secondary | ICD-10-CM | POA: Diagnosis not present

## 2019-05-12 DIAGNOSIS — F329 Major depressive disorder, single episode, unspecified: Secondary | ICD-10-CM | POA: Diagnosis not present

## 2019-05-12 DIAGNOSIS — E785 Hyperlipidemia, unspecified: Secondary | ICD-10-CM

## 2019-05-12 DIAGNOSIS — Z0000 Encounter for general adult medical examination without abnormal findings: Secondary | ICD-10-CM | POA: Diagnosis not present

## 2019-05-12 DIAGNOSIS — F32A Depression, unspecified: Secondary | ICD-10-CM

## 2019-05-12 DIAGNOSIS — N3281 Overactive bladder: Secondary | ICD-10-CM

## 2019-05-12 DIAGNOSIS — M8580 Other specified disorders of bone density and structure, unspecified site: Secondary | ICD-10-CM

## 2019-05-12 MED ORDER — TETANUS-DIPHTH-ACELL PERTUSSIS 5-2.5-18.5 LF-MCG/0.5 IM SUSP: 0.5000 mL | Freq: Once | INTRAMUSCULAR | 0 refills | Status: AC

## 2019-05-12 MED ORDER — ALENDRONATE SODIUM 70 MG PO TABS: 70.0000 mg | ORAL_TABLET | ORAL | 3 refills | Status: DC

## 2019-05-12 NOTE — Patient Instructions (Addendum)
GO TO THE FRONT DESK Schedule labs to be done next week, fasting  Schedule your next appointment   for checkup in 2 months  Try to stay active.  Take alendronate once weekly on an empty stomach with 2 glasses of water.  Take vitamin D  We will schedule echocardiogram     Check the  blood pressure 2 or 3 times a  Week  BP GOAL is between 110/65 and  135/85. If it is consistently higher or lower, let me know     Fall Prevention in the Home, Adult Falls can cause injuries and can affect people from all age groups. There are many simple things that you can do to make your home safe and to help prevent falls. Ask for help when making these changes, if needed. What actions can I take to prevent falls? General instructions  Use good lighting in all rooms. Replace any light bulbs that burn out.  Turn on lights if it is dark. Use night-lights.  Place frequently used items in easy-to-reach places. Lower the shelves around your home if necessary.  Set up furniture so that there are clear paths around it. Avoid moving your furniture around.  Remove throw rugs and other tripping hazards from the floor.  Avoid walking on wet floors.  Fix any uneven floor surfaces.  Add color or contrast paint or tape to grab bars and handrails in your home. Place contrasting color strips on the first and last steps of stairways.  When you use a stepladder, make sure that it is completely opened and that the sides are firmly locked. Have someone hold the ladder while you are using it. Do not climb a closed stepladder.  Be aware of any and all pets. What can I do in the bathroom?      Keep the floor dry. Immediately clean up any water that spills onto the floor.  Remove soap buildup in the tub or shower on a regular basis.  Use non-skid mats or decals on the floor of the tub or shower.  Attach bath mats securely with double-sided, non-slip rug tape.  If you need to sit down while you are  in the shower, use a plastic, non-slip stool.  Install grab bars by the toilet and in the tub and shower. Do not use towel bars as grab bars. What can I do in the bedroom?  Make sure that a bedside light is easy to reach.  Do not use oversized bedding that drapes onto the floor.  Have a firm chair that has side arms to use for getting dressed. What can I do in the kitchen?  Clean up any spills right away.  If you need to reach for something above you, use a sturdy step stool that has a grab bar.  Keep electrical cables out of the way.  Do not use floor polish or wax that makes floors slippery. If you must use wax, make sure that it is non-skid floor wax. What can I do in the stairways?  Do not leave any items on the stairs.  Make sure that you have a light switch at the top of the stairs and the bottom of the stairs. Have them installed if you do not have them.  Make sure that there are handrails on both sides of the stairs. Fix handrails that are broken or loose. Make sure that handrails are as long as the stairways.  Install non-slip stair treads on all stairs in your home.  Avoid having throw rugs at the top or bottom of stairways, or secure the rugs with carpet tape to prevent them from moving.  Choose a carpet design that does not hide the edge of steps on the stairway.  Check any carpeting to make sure that it is firmly attached to the stairs. Fix any carpet that is loose or worn. What can I do on the outside of my home?  Use bright outdoor lighting.  Regularly repair the edges of walkways and driveways and fix any cracks.  Remove high doorway thresholds.  Trim any shrubbery on the main path into your home.  Regularly check that handrails are securely fastened and in good repair. Both sides of any steps should have handrails.  Install guardrails along the edges of any raised decks or porches.  Clear walkways of debris and clutter, including tools and  rocks.  Have leaves, snow, and ice cleared regularly.  Use sand or salt on walkways during winter months.  In the garage, clean up any spills right away, including grease or oil spills. What other actions can I take?  Wear closed-toe shoes that fit well and support your feet. Wear shoes that have rubber soles or low heels.  Use mobility aids as needed, such as canes, walkers, scooters, and crutches.  Review your medicines with your health care provider. Some medicines can cause dizziness or changes in blood pressure, which increase your risk of falling. Talk with your health care provider about other ways that you can decrease your risk of falls. This may include working with a physical therapist or trainer to improve your strength, balance, and endurance. Where to find more information  Centers for Disease Control and Prevention, STEADI: WebmailGuide.co.za  [employer] on Aging: BrainJudge.co.uk Contact a health care provider if:  You are afraid of falling at home.  You feel weak, drowsy, or dizzy at home.  You fall at home. Summary  There are many simple things that you can do to make your home safe and to help prevent falls.  Ways to make your home safe include removing tripping hazards and installing grab bars in the bathroom.  Ask for help when making these changes in your home. This information is not intended to replace advice given to you by your health care provider. Make sure you discuss any questions you have with your health care provider. Document Released: 08/21/2002 Document Revised: 08/13/2017 Document Reviewed: 04/15/2017 Elsevier Patient Education  2020 [employer].

## 2019-05-12 NOTE — Progress Notes (Signed)
Pre visit review using our clinic review tool, if applicable. No additional management support is needed unless otherwise documented below in the visit note. 

## 2019-05-12 NOTE — Progress Notes (Signed)
Subjective:    Patient ID: [name], female    DOB: [birth year], 83 y.o.   MRN: [id number]  DOS:  05/12/2019 Type of visit - description: CPX Multiple other issues discussed today Her husband is now in a nursing home, she is very sad about it, lives by herself, unable to visit friends and family. She is on Effexor, no depression per se just sadness.  Denies feeling withdraw, still reaches out to family. Had a fall had ankle fracture Apparently not taking alendronate.   Wt Readings from Last 3 Encounters:  05/12/19 172 lb 2 oz (78.1 kg)  05/09/18 185 lb (83.9 kg)  01/05/18 178 lb 2 oz (80.8 kg)    Review of Systems Specifically denies chest pain, difficulty breathing, dizziness or faintness.  Other than above, a 14 point review of systems is negative      Past Medical History:  Diagnosis Date  . Aortic stenosis    ECHO 5-20ll mild AS but no evidence of HCM or MR--no further testing suggested   . Blood transfusion without reported diagnosis 1971   after hysterectomy  . 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 Depression   . GERD (gastroesophageal reflux disease)   . History of gastroesophageal reflux (GERD)   . Hx of adenomatous colonic polyps   . Hx of cardiac cath 2006   neg  . Hypertension   . Mixed urge and stress incontinence   . OAB (overactive bladder) 07/13/2012  . Osteoarthritis   . Osteopenia    rx fosamax 08-2013  . Personal history of colonic polyps - adenomas 04/19/2014    Past Surgical History:  Procedure Laterality Date  . ABDOMINAL HYSTERECTOMY  1971  . APPENDECTOMY  1971   (at time of hyst?)  . BLADDER SURGERY  2003  . CATARACT EXTRACTION, BILATERAL    . CHOLECYSTECTOMY    . OOPHORECTOMY  1971  . ROTATOR CUFF REPAIR Right   . TOE SURGERY  1995   R 2nd --- correction of claw toe     Social History   Socioeconomic History  . Marital status: Married    Spouse name: Not on file  . Number of children: 2  . Years of  education: Not on file  . Highest education level: Not on file  Occupational History  . Occupation: stays at home     Employer: RETIRED  Social Needs  . Financial resource strain: Not on file  . Food insecurity    Worry: Not on file    Inability: Not on file  . Transportation needs    Medical: Not on file    Non-medical: Not on file  Tobacco Use  . Smoking status: Never Smoker  . Smokeless tobacco: Never Used  Substance and Sexual Activity  . Alcohol use: Yes    Alcohol/week: 0.0 standard drinks    Comment: wine rarely  . Drug use: No  . Sexual activity: Not Currently  Lifestyle  . Physical activity    Days per week: Not on file    Minutes per session: Not on file  . Stress: Not on file  Relationships  . Social Herbalist on phone: Not on file    Gets together: Not on file    Attends religious service: Not on file    Active member of club or organization: Not on file    Attends meetings of clubs or organizations: Not on file    Relationship  status: Not on file  . Intimate partner violence    Fear of current or ex partner: Not on file    Emotionally abused: Not on file    Physically abused: Not on file    Forced sexual activity: Not on file  Other Topics Concern  . Not on file  Social History Narrative   Lives by herself     Husband moved to a NH 08/2018   Moved to small apartment 07-2017   2 children, Elta Guadeloupe ([employer]force), daughter Darnelle Bos ([employer]), she is very supportive     Family History  Problem Relation Age of Onset  . Coronary artery disease Father 67       had CABG  . Cancer Father        gallbladder  . Stroke Mother 92  . Ovarian cancer Sister 53  . Colon cancer Neg Hx        NEGATIVE  . Breast cancer Neg Hx        NEGATIVE     Allergies as of 05/12/2019   No Known Allergies     Medication List       Accurate as of May 12, 2019 11:59 PM. If you have any questions, ask your nurse or doctor.        alendronate 70 MG tablet  Commonly known as: FOSAMAX Take 1 tablet (70 mg total) by mouth every 7 (seven) days. Take with a full glass of water on an empty stomach.   aspirin 81 MG tablet Take 81 mg by mouth daily.   B-complex with vitamin C tablet Take 1 tablet by mouth daily.   Cholecalciferol 25 MCG (1000 UT) capsule Take 1,000 Units by mouth daily.   FISH OIL PO Take by mouth.   GERITOL PO Take by mouth daily.   NIFEdipine 60 MG 24 hr tablet Commonly known as: PROCARDIA XL/NIFEDICAL XL Take 1 tablet (60 mg total) by mouth daily.   OCUVITE PO Take by mouth.   omeprazole 20 MG capsule Commonly known as: PRILOSEC Take 1 capsule (20 mg total) by mouth 2 (two) times daily before a meal.   oxybutynin 5 MG tablet Commonly known as: DITROPAN Take 1 tablet (5 mg total) by mouth 3 (three) times daily.   simvastatin 20 MG tablet Commonly known as: ZOCOR Take 1 tablet (20 mg total) by mouth daily.   Tdap 5-2.5-18.5 LF-MCG/0.5 injection Commonly known as: BOOSTRIX Inject 0.5 mLs into the muscle once for 1 dose. Started by: [name], MD   venlafaxine XR 37.5 MG 24 hr capsule Commonly known as: EFFEXOR-XR Take 1 capsule (37.5 mg total) by mouth daily with breakfast.   VITAMIN C PO Take by mouth daily.   vitamin E 600 UNIT capsule Take 600 Units by mouth daily.           Objective:   Physical Exam BP (!) 160/76 (BP Location: Left Arm, Patient Position: Sitting, Cuff Size: Small)   Pulse 81   Temp (!) 97.4 F (36.3 C) (Temporal)   Resp 16   Ht 5\' 7"  (1.702 m)   Wt 172 lb 2 oz (78.1 kg)   SpO2 97%   BMI 26.96 kg/m  General: Well developed, NAD, BMI noted Neck: No  thyromegaly  HEENT:  Normocephalic . Face symmetric, atraumatic Lungs:  CTA B Normal respiratory effort, no intercostal retractions, no accessory muscle use. Heart: RRR, + systolic murmur, I believe this is slightly more noticeable than before No pretibial edema bilaterally  Abdomen:  Not distended, soft,  non-tender. No rebound or rigidity.   Skin: Exposed areas without rash. Not pale. Not jaundice Neurologic:  alert & oriented X3.  Speech normal, gait appropriate for age and unassisted Strength symmetric and appropriate for age.  Psych: Cognition and judgment appear intact.  Cooperative with normal attention span and concentration.  Behavior appropriate. No anxious but slt  depressed appearing.     Assessment     Assessment HTN Hyperlipidemia  GERD Depression Osteopenia, t score 2008 -1.8, 2011 -1.8, 2014 -2.1: Fosamax Rx 08-2013 t score 03-2016: -2.1. H/o vit d def  DJD Aortic stenosis ( at some point diagnosed with HOCM) ---echo 01-2010: "Mild AS but no evidence of HCM or MR. ---Echo 6-15 mild AoS. Overactive bladder- pcp started to rx meds 09-2016  Ao [place]: wnl 02-2015  PLAN: HTN: BP today slightly elevated, no ambulatory BPs.  We will continue Procardia and reassess in 2 months Hyperlipidemia: On simvastatin.  Checking labs Osteopenia: She was prescribed alendronate before, apparently she took it intermittently for 3 years but has not been taking in the last year. She recently had an ankle fracture, 03-2019, closed nondisplaced. Plan: Restart alendronate, precautions discussed.  Vitamin D.  DEXA in 1 year. Aortic stenosis: No symptoms, murmur seems to be slightly more noticeable today. EKG sinus rhythm, LBBB, at baseline. Rx echocardiogram Overactive bladder: Controlled Depression: On Effexor, controlled, she seems to be appropriately sad about not living with her husband (see HPI) RTC 2 months   Today, I spent more than 22   min with the patient in addition to CPX: >50% of the time counseling regards chronic medical problems, depression, alendronate precautions

## 2019-05-13 NOTE — Assessment & Plan Note (Signed)
HTN: BP today slightly elevated, no ambulatory BPs.  We will continue Procardia and reassess in 2 months Hyperlipidemia: On simvastatin.  Checking labs Osteopenia: She was prescribed alendronate before, apparently she took it intermittently for 3 years but has not been taking in the last year. She recently had an ankle fracture, 03-2019, closed nondisplaced. Plan: Restart alendronate, precautions discussed.  Vitamin D.  DEXA in 1 year. Aortic stenosis: No symptoms, murmur seems to be slightly more noticeable today. EKG sinus rhythm, LBBB, at baseline. Rx echocardiogram Overactive bladder: Controlled Depression: On Effexor, controlled, she seems to be appropriately sad about not living with her husband (see HPI) RTC 2 months

## 2019-05-13 NOTE — Assessment & Plan Note (Signed)
--  Tdap rx printed - pneumonia shot 2005 and 2010 -prevnar: 2015 - zostavax 2012 -shingrex will discuss RTC - flu shot: pt will wait d/t entering a Covid vaccine trial  --no  further PAPs --Last MMG  03-2016 .  Further screening optional per guidelines -- CCS: last colonoscopy 04/2014, no f/u per letter  -- Labs: CMP, FLP, CBC --Patient education: Fall prevention.  Diet and exercise

## 2019-05-16 ENCOUNTER — Other Ambulatory Visit: Payer: Self-pay

## 2019-05-16 ENCOUNTER — Telehealth: Payer: Self-pay | Admitting: Emergency Medicine

## 2019-05-16 ENCOUNTER — Other Ambulatory Visit (INDEPENDENT_AMBULATORY_CARE_PROVIDER_SITE_OTHER): Payer: Medicare Other

## 2019-05-16 DIAGNOSIS — E785 Hyperlipidemia, unspecified: Secondary | ICD-10-CM | POA: Diagnosis not present

## 2019-05-16 LAB — COMPREHENSIVE METABOLIC PANEL
ALT: 15 U/L (ref 0–35)
AST: 14 U/L (ref 0–37)
Albumin: 4.4 g/dL (ref 3.5–5.2)
Alkaline Phosphatase: 59 U/L (ref 39–117)
BUN: 26 mg/dL — ABNORMAL HIGH (ref 6–23)
CO2: 32 mEq/L (ref 19–32)
Calcium: 9.6 mg/dL (ref 8.4–10.5)
Chloride: 104 mEq/L (ref 96–112)
Creatinine, Ser: 0.99 mg/dL (ref 0.40–1.20)
GFR: 53.81 mL/min — ABNORMAL LOW (ref >60.00)
Glucose, Bld: 90 mg/dL (ref 70–99)
Potassium: 3.8 mEq/L (ref 3.5–5.1)
Sodium: 144 mEq/L (ref 135–145)
Total Bilirubin: 0.4 mg/dL (ref 0.2–1.2)
Total Protein: 6.8 g/dL (ref 6.0–8.3)

## 2019-05-16 LAB — CBC WITH DIFFERENTIAL/PLATELET
Basophils Absolute: 0 10*3/uL (ref 0.0–0.1)
Basophils Relative: 1.1 % (ref 0.0–3.0)
Eosinophils Absolute: 0.3 10*3/uL (ref 0.0–0.7)
Eosinophils Relative: 5.9 % — ABNORMAL HIGH (ref 0.0–5.0)
HCT: 37.5 % (ref 36.0–46.0)
Hemoglobin: 12.5 g/dL (ref 12.0–15.0)
Lymphocytes Relative: 33.8 % (ref 12.0–46.0)
Lymphs Abs: 1.5 10*3/uL (ref 0.7–4.0)
MCHC: 33.4 g/dL (ref 30.0–36.0)
MCV: 90.3 fl (ref 78.0–100.0)
Monocytes Absolute: 0.5 10*3/uL (ref 0.1–1.0)
Monocytes Relative: 10.6 % (ref 3.0–12.0)
Neutro Abs: 2.2 10*3/uL (ref 1.4–7.7)
Neutrophils Relative %: 48.6 % (ref 43.0–77.0)
Platelets: 207 10*3/uL (ref 150.0–400.0)
RBC: 4.16 Mil/uL (ref 3.87–5.11)
RDW: 12.4 % (ref 11.5–15.5)
WBC: 4.4 10*3/uL (ref 4.0–10.5)

## 2019-05-16 LAB — LIPID PANEL
Cholesterol: 151 mg/dL (ref 0–200)
HDL: 54.6 mg/dL (ref >39.00)
LDL Cholesterol: 76 mg/dL (ref 0–99)
NonHDL: 96.77
Total CHOL/HDL Ratio: 3
Triglycerides: 106 mg/dL (ref 0.0–149.0)
VLDL: 21.2 mg/dL (ref 0.0–40.0)

## 2019-05-16 NOTE — Telephone Encounter (Signed)
Looks like Pt received TDAP at pharmacy w/ prescription we gave her- will contact her shortly.

## 2019-05-16 NOTE — Telephone Encounter (Signed)
Patient was in for lab visit today and asked if you would give her a call. She went to the Pharmacy to get what she thought was a refill and stated she was given a shot and does not know what it was. She also would like to know if she was suppose to pick up the refill.

## 2019-05-16 NOTE — Telephone Encounter (Signed)
Spoke w/ Pt-- informed that looks like she received TDAP at pharmacy. Pt verbalized understanding.

## 2019-05-25 ENCOUNTER — Other Ambulatory Visit: Payer: Self-pay

## 2019-05-25 ENCOUNTER — Ambulatory Visit (HOSPITAL_COMMUNITY): Payer: Medicare Other | Attending: Internal Medicine

## 2019-05-25 DIAGNOSIS — I350 Nonrheumatic aortic (valve) stenosis: Secondary | ICD-10-CM | POA: Diagnosis not present

## 2019-06-01 ENCOUNTER — Ambulatory Visit (INDEPENDENT_AMBULATORY_CARE_PROVIDER_SITE_OTHER): Payer: Medicare Other

## 2019-06-01 ENCOUNTER — Other Ambulatory Visit: Payer: Self-pay

## 2019-06-01 DIAGNOSIS — Z23 Encounter for immunization: Secondary | ICD-10-CM | POA: Diagnosis not present

## 2019-06-01 NOTE — Progress Notes (Signed)
Flu shot given w/o any complications 

## 2019-07-11 ENCOUNTER — Ambulatory Visit (INDEPENDENT_AMBULATORY_CARE_PROVIDER_SITE_OTHER): Payer: Medicare Other | Admitting: Internal Medicine

## 2019-07-11 ENCOUNTER — Ambulatory Visit: Payer: Medicare Other | Admitting: Internal Medicine

## 2019-07-11 DIAGNOSIS — I10 Essential (primary) hypertension: Secondary | ICD-10-CM | POA: Diagnosis not present

## 2019-07-11 DIAGNOSIS — F329 Major depressive disorder, single episode, unspecified: Secondary | ICD-10-CM | POA: Diagnosis not present

## 2019-07-11 DIAGNOSIS — M810 Age-related osteoporosis without current pathological fracture: Secondary | ICD-10-CM

## 2019-07-11 DIAGNOSIS — F32A Depression, unspecified: Secondary | ICD-10-CM

## 2019-07-11 NOTE — Assessment & Plan Note (Signed)
[id number] on Procardia,  recommend her to check ambulatory BPs. Depression: Currently on Effexor, recently lost her husband, fortunately she is doing well.  She has a lot of family support. Knows to call me if help is needed Osteoporosis: since last OV, good compliance with Fosamax without apparent side effects RTC 6 months

## 2019-07-11 NOTE — Progress Notes (Signed)
Subjective:    Patient ID: [name], female    DOB: 1936-08-05, 83 y.o.   MRN: [id number]  DOS:  07/11/2019 Type of visit - description: Attempted  to make this a video visit, due to technical difficulties from the patient side it was not possible  thus we proceeded with a Virtual Visit via Telephone    I connected with@   by telephone and verified that I am speaking with the correct person using two identifiers.  THIS ENCOUNTER IS A VIRTUAL VISIT DUE TO COVID-19 - PATIENT WAS NOT SEEN IN THE OFFICE. PATIENT HAS CONSENTED TO VIRTUAL VISIT / TELEMEDICINE VISIT   Location of patient: home  Location of provider: office  I discussed the limitations, risks, security and privacy concerns of performing an evaluation and management service by telephone and the availability of in person appointments. I also discussed with the patient that there may be a patient responsible charge related to this service. The patient expressed understanding and agreed to proceed.   History of Present Illness: Follow-up The patient had a physical exam 2 months ago, since then her husband -who was living at a memory unit- passed away. She is actually doing okay emotionally, has a lot of family support. We review her medications and recent labs.  Good compliance. She is not checking her ambulatory BPs.    Review of Systems Denies insomnia  Past Medical History:  Diagnosis Date  . Aortic stenosis    ECHO 5-20ll mild AS but no evidence of HCM or MR--no further testing suggested   . Blood transfusion without reported diagnosis 1971   after hysterectomy  . Cancer ([place]) 2019   squamous cell carcinoma- on chest  . Depression   . GERD (gastroesophageal reflux disease)   . History of gastroesophageal reflux (GERD)   . Hx of adenomatous colonic polyps   . Hx of cardiac cath 2006   neg  . Hypertension   . Mixed urge and stress incontinence   . OAB (overactive bladder) 07/13/2012  . Osteoarthritis   .  Osteopenia    rx fosamax 08-2013  . Personal history of colonic polyps - adenomas 04/19/2014    Past Surgical History:  Procedure Laterality Date  . ABDOMINAL HYSTERECTOMY  1971  . APPENDECTOMY  1971   (at time of hyst?)  . BLADDER SURGERY  2003  . CATARACT EXTRACTION, BILATERAL    . CHOLECYSTECTOMY    . OOPHORECTOMY  1971  . ROTATOR CUFF REPAIR Right   . TOE SURGERY  1995   R 2nd --- correction of claw toe     Social History   Socioeconomic History  . Marital status: Widowed    Spouse name: Not on file  . Number of children: 2  . Years of education: Not on file  . Highest education level: Not on file  Occupational History  . Occupation: stays at home     Employer: RETIRED  Social Needs  . Financial resource strain: Not on file  . Food insecurity    Worry: Not on file    Inability: Not on file  . Transportation needs    Medical: Not on file    Non-medical: Not on file  Tobacco Use  . Smoking status: Never Smoker  . Smokeless tobacco: Never Used  Substance and Sexual Activity  . Alcohol use: Yes    Alcohol/week: 0.0 standard drinks    Comment: wine rarely  . Drug use: No  . Sexual activity: Not Currently  Lifestyle  . Physical activity    Days per week: Not on file    Minutes per session: Not on file  . Stress: Not on file  Relationships  . Social Herbalist on phone: Not on file    Gets together: Not on file    Attends religious service: Not on file    Active member of club or organization: Not on file    Attends meetings of clubs or organizations: Not on file    Relationship status: Not on file  . Intimate partner violence    Fear of current or ex partner: Not on file    Emotionally abused: Not on file    Physically abused: Not on file    Forced sexual activity: Not on file  Other Topics Concern  . Not on file  Social History Narrative   Lives by herself     Husband moved to a NH 08/2018   Moved to small apartment 07-2017   2 children,  Elta Guadeloupe (airforce), daughter Darnelle Bos ([employer]), she is very supportive      Allergies as of 07/11/2019   No Known Allergies     Medication List       Accurate as of July 11, 2019  9:33 PM. If you have any questions, ask your nurse or doctor.        alendronate 70 MG tablet Commonly known as: FOSAMAX Take 1 tablet (70 mg total) by mouth every 7 (seven) days. Take with a full glass of water on an empty stomach.   aspirin 81 MG tablet Take 81 mg by mouth daily.   B-complex with vitamin C tablet Take 1 tablet by mouth daily.   Cholecalciferol 25 MCG (1000 UT) capsule Take 1,000 Units by mouth daily.   FISH OIL PO Take by mouth.   GERITOL PO Take by mouth daily.   NIFEdipine 60 MG 24 hr tablet Commonly known as: PROCARDIA XL/NIFEDICAL XL Take 1 tablet (60 mg total) by mouth daily.   OCUVITE PO Take by mouth.   omeprazole 20 MG capsule Commonly known as: PRILOSEC Take 1 capsule (20 mg total) by mouth 2 (two) times daily before a meal.   oxybutynin 5 MG tablet Commonly known as: DITROPAN Take 1 tablet (5 mg total) by mouth 3 (three) times daily.   simvastatin 20 MG tablet Commonly known as: ZOCOR Take 1 tablet (20 mg total) by mouth daily.   venlafaxine XR 37.5 MG 24 hr capsule Commonly known as: EFFEXOR-XR Take 1 capsule (37.5 mg total) by mouth daily with breakfast.   VITAMIN C PO Take by mouth daily.   vitamin E 600 UNIT capsule Take 600 Units by mouth daily.           Objective:   Physical Exam There were no vitals taken for this visit. This is a phone virtual visit, she is alert oriented x3, no apparent distress.    Assessment     Assessment HTN Hyperlipidemia  GERD Depression Osteopenia, t score 2008 -1.8, 2011 -1.8, 2014 -2.1: Fosamax Rx 08-2013 t score 03-2016: -2.1. H/o vit d def  DJD Aortic stenosis ( at some point diagnosed with HOCM) ---echo 01-2010: "Mild AS but no evidence of HCM or MR. ---Echo 6-15 mild AoS. Overactive  bladder- pcp started to rx meds 09-2016  Ao [place]: wnl 02-2015  PLAN: [id number] on Procardia,  recommend her to check ambulatory BPs. Depression: Currently on Effexor, recently lost her husband, fortunately she is  doing well.  She has a lot of family support. Knows to call me if help is needed Osteoporosis: since last OV, good compliance with Fosamax without apparent side effects RTC 6 months     I discussed the assessment and treatment plan with the patient. The patient was provided an opportunity to ask questions and all were answered. The patient agreed with the plan and demonstrated an understanding of the instructions.   The patient was advised to call back or seek an in-person evaluation if the symptoms worsen or if the condition fails to improve as anticipated.  I provided 10 minutes of non-face-to-face time during this encounter.  [name], MD

## 2019-07-13 ENCOUNTER — Other Ambulatory Visit: Payer: Self-pay | Admitting: Internal Medicine

## 2019-07-28 DIAGNOSIS — M25532 Pain in left wrist: Secondary | ICD-10-CM | POA: Diagnosis not present

## 2019-07-28 DIAGNOSIS — W19XXXA Unspecified fall, initial encounter: Secondary | ICD-10-CM | POA: Diagnosis not present

## 2019-07-28 DIAGNOSIS — Y92009 Unspecified place in unspecified non-institutional (private) residence as the place of occurrence of the external cause: Secondary | ICD-10-CM | POA: Diagnosis not present

## 2019-07-28 DIAGNOSIS — S52572A Other intraarticular fracture of lower end of left radius, initial encounter for closed fracture: Secondary | ICD-10-CM | POA: Diagnosis not present

## 2019-07-28 DIAGNOSIS — S52502A Unspecified fracture of the lower end of left radius, initial encounter for closed fracture: Secondary | ICD-10-CM | POA: Diagnosis not present

## 2019-07-31 DIAGNOSIS — S52572A Other intraarticular fracture of lower end of left radius, initial encounter for closed fracture: Secondary | ICD-10-CM | POA: Diagnosis not present

## 2019-07-31 DIAGNOSIS — S52502A Unspecified fracture of the lower end of left radius, initial encounter for closed fracture: Secondary | ICD-10-CM | POA: Insufficient documentation

## 2019-08-02 DIAGNOSIS — S52572A Other intraarticular fracture of lower end of left radius, initial encounter for closed fracture: Secondary | ICD-10-CM | POA: Diagnosis not present

## 2019-08-07 DIAGNOSIS — S52502A Unspecified fracture of the lower end of left radius, initial encounter for closed fracture: Secondary | ICD-10-CM | POA: Diagnosis not present

## 2019-08-07 DIAGNOSIS — S52572A Other intraarticular fracture of lower end of left radius, initial encounter for closed fracture: Secondary | ICD-10-CM | POA: Diagnosis not present

## 2019-08-16 DIAGNOSIS — H353131 Nonexudative age-related macular degeneration, bilateral, early dry stage: Secondary | ICD-10-CM | POA: Diagnosis not present

## 2019-08-16 DIAGNOSIS — H5211 Myopia, right eye: Secondary | ICD-10-CM | POA: Diagnosis not present

## 2019-08-16 DIAGNOSIS — Z961 Presence of intraocular lens: Secondary | ICD-10-CM | POA: Diagnosis not present

## 2019-08-16 DIAGNOSIS — H26493 Other secondary cataract, bilateral: Secondary | ICD-10-CM | POA: Diagnosis not present

## 2019-08-16 DIAGNOSIS — H04123 Dry eye syndrome of bilateral lacrimal glands: Secondary | ICD-10-CM | POA: Diagnosis not present

## 2019-08-28 DIAGNOSIS — S52502A Unspecified fracture of the lower end of left radius, initial encounter for closed fracture: Secondary | ICD-10-CM | POA: Diagnosis not present

## 2019-08-28 DIAGNOSIS — S52572A Other intraarticular fracture of lower end of left radius, initial encounter for closed fracture: Secondary | ICD-10-CM | POA: Diagnosis not present

## 2019-09-12 DIAGNOSIS — H353131 Nonexudative age-related macular degeneration, bilateral, early dry stage: Secondary | ICD-10-CM | POA: Diagnosis not present

## 2019-09-12 DIAGNOSIS — H26492 Other secondary cataract, left eye: Secondary | ICD-10-CM | POA: Diagnosis not present

## 2019-09-12 DIAGNOSIS — H04123 Dry eye syndrome of bilateral lacrimal glands: Secondary | ICD-10-CM | POA: Diagnosis not present

## 2019-09-12 DIAGNOSIS — Z961 Presence of intraocular lens: Secondary | ICD-10-CM | POA: Diagnosis not present

## 2019-10-02 DIAGNOSIS — M170 Bilateral primary osteoarthritis of knee: Secondary | ICD-10-CM | POA: Diagnosis not present

## 2019-10-02 DIAGNOSIS — M25562 Pain in left knee: Secondary | ICD-10-CM | POA: Diagnosis not present

## 2019-10-02 DIAGNOSIS — M25561 Pain in right knee: Secondary | ICD-10-CM | POA: Diagnosis not present

## 2019-10-02 DIAGNOSIS — M1712 Unilateral primary osteoarthritis, left knee: Secondary | ICD-10-CM | POA: Diagnosis not present

## 2019-10-05 DIAGNOSIS — S52502A Unspecified fracture of the lower end of left radius, initial encounter for closed fracture: Secondary | ICD-10-CM | POA: Diagnosis not present

## 2019-10-13 DIAGNOSIS — H26491 Other secondary cataract, right eye: Secondary | ICD-10-CM | POA: Diagnosis not present

## 2019-10-29 ENCOUNTER — Ambulatory Visit: Payer: Medicare Other | Attending: Internal Medicine

## 2019-10-30 DIAGNOSIS — S52572D Other intraarticular fracture of lower end of left radius, subsequent encounter for closed fracture with routine healing: Secondary | ICD-10-CM | POA: Diagnosis not present

## 2019-12-19 ENCOUNTER — Other Ambulatory Visit: Payer: Self-pay

## 2019-12-19 MED ORDER — OXYBUTYNIN CHLORIDE 5 MG PO TABS: 5.0000 mg | ORAL_TABLET | Freq: Three times a day (TID) | ORAL | 3 refills | Status: DC

## 2019-12-19 NOTE — Telephone Encounter (Signed)
Patient called into see if DR. Paz can send in a prescription for  oxybutynin (DITROPAN) 5 MG tablet [id number]    Patient has lost her medication please send it to [facility]  [place], [place] [address]  Phone:  [phone] Fax:  [phone]

## 2019-12-19 NOTE — Telephone Encounter (Signed)
Rx sent 

## 2019-12-22 ENCOUNTER — Other Ambulatory Visit: Payer: Self-pay | Admitting: Internal Medicine

## 2020-02-26 ENCOUNTER — Ambulatory Visit: Payer: Medicare Other | Admitting: Internal Medicine

## 2020-02-26 ENCOUNTER — Ambulatory Visit (INDEPENDENT_AMBULATORY_CARE_PROVIDER_SITE_OTHER): Payer: Medicare Other | Admitting: Internal Medicine

## 2020-02-26 ENCOUNTER — Other Ambulatory Visit: Payer: Self-pay

## 2020-02-26 ENCOUNTER — Encounter: Payer: Self-pay | Admitting: Internal Medicine

## 2020-02-26 VITALS — BP 143/87 | HR 87 | Temp 96.4°F | Resp 18 | Ht 67.0 in | Wt 176.5 lb

## 2020-02-26 DIAGNOSIS — R399 Unspecified symptoms and signs involving the genitourinary system: Secondary | ICD-10-CM

## 2020-02-26 DIAGNOSIS — R0600 Dyspnea, unspecified: Secondary | ICD-10-CM | POA: Diagnosis not present

## 2020-02-26 DIAGNOSIS — F329 Major depressive disorder, single episode, unspecified: Secondary | ICD-10-CM | POA: Diagnosis not present

## 2020-02-26 DIAGNOSIS — R0609 Other forms of dyspnea: Secondary | ICD-10-CM

## 2020-02-26 DIAGNOSIS — F32A Depression, unspecified: Secondary | ICD-10-CM

## 2020-02-26 DIAGNOSIS — I10 Essential (primary) hypertension: Secondary | ICD-10-CM

## 2020-02-26 DIAGNOSIS — F0390 Unspecified dementia without behavioral disturbance: Secondary | ICD-10-CM | POA: Diagnosis not present

## 2020-02-26 MED ORDER — MEMANTINE HCL 5 MG PO TABS
5.0000 mg | ORAL_TABLET | Freq: Every day | ORAL | 3 refills | Status: DC
Start: 2020-02-26 — End: 2020-04-30

## 2020-02-26 MED ORDER — VENLAFAXINE HCL ER 37.5 MG PO CP24: 37.5000 mg | ORAL_CAPSULE | Freq: Two times a day (BID) | ORAL | 3 refills | Status: DC

## 2020-02-26 NOTE — Progress Notes (Signed)
Pre visit review using our clinic review tool, if applicable. No additional management support is needed unless otherwise documented below in the visit note. 

## 2020-02-26 NOTE — Progress Notes (Signed)
Subjective:    Patient ID: [name], female    DOB: December 10, 1935, 84 y.o.   MRN: [id number]  DOS:  02/26/2020 Type of visit - description: Acute Here at the request of her daughter Reino Kent reports that the patient is repeating things all the time, sometimes gets confused, calling her a different name, she is very talkative, more than  her normal self The patient thinks that the problem started when she lost her husband in October 2020 but according to the daughter, symptoms have accelerated lately.  Reports she is still grieving from her husband but she does not feel tearful.  She just feels lonely.   Review of Systems Denies chest pain, difficulty breathing or palpitations but has noticed some DOE lately. No suicidal ideas No headache or head injury Occasional urinary frequency without other LUTS, request a UA.   Past Medical History:  Diagnosis Date  . Aortic stenosis    ECHO 5-20ll mild AS but no evidence of HCM or MR--no further testing suggested   . Blood transfusion without reported diagnosis 1971   after hysterectomy  . 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 Depression   . GERD (gastroesophageal reflux disease)   . History of gastroesophageal reflux (GERD)   . Hx of adenomatous colonic polyps   . Hx of cardiac cath 2006   neg  . Hypertension   . Mixed urge and stress incontinence   . OAB (overactive bladder) 07/13/2012  . Osteoarthritis   . Osteopenia    rx fosamax 08-2013  . Personal history of colonic polyps - adenomas 04/19/2014    Past Surgical History:  Procedure Laterality Date  . ABDOMINAL HYSTERECTOMY  1971  . APPENDECTOMY  1971   (at time of hyst?)  . BLADDER SURGERY  2003  . CATARACT EXTRACTION, BILATERAL    . CHOLECYSTECTOMY    . OOPHORECTOMY  1971  . ROTATOR CUFF REPAIR Right   . TOE SURGERY  1995   R 2nd --- correction of claw toe     Allergies as of 02/26/2020   No Known Allergies     Medication List        Accurate as of February 26, 2020 11:59 PM. If you have any questions, ask your nurse or doctor.        alendronate 70 MG tablet Commonly known as: FOSAMAX Take 1 tablet (70 mg total) by mouth every 7 (seven) days. Take with a full glass of water on an empty stomach.   aspirin 81 MG tablet Take 81 mg by mouth daily.   B-complex with vitamin C tablet Take 1 tablet by mouth daily.   Cholecalciferol 25 MCG (1000 UT) capsule Take 1,000 Units by mouth daily.   FISH OIL PO Take by mouth.   GERITOL PO Take by mouth daily.   memantine 5 MG tablet Commonly known as: Namenda Take 1 tablet (5 mg total) by mouth daily. Started by: [name], MD   NIFEdipine 60 MG 24 hr tablet Commonly known as: PROCARDIA XL/NIFEDICAL XL TAKE 1 TABLET BY MOUTH  DAILY   OCUVITE PO Take by mouth.   omeprazole 20 MG capsule Commonly known as: PRILOSEC Take 1 capsule (20 mg total) by mouth 2 (two) times daily before a meal.   oxybutynin 5 MG tablet Commonly known as: DITROPAN Take 1 tablet (5 mg total) by mouth 3 (three) times daily.   simvastatin 20 MG tablet Commonly known as: ZOCOR TAKE 1  TABLET BY MOUTH  DAILY   venlafaxine XR 37.5 MG 24 hr capsule Commonly known as: EFFEXOR-XR Take 1 capsule (37.5 mg total) by mouth in the morning and at bedtime. What changed: See the new instructions. Changed by: [name], MD   VITAMIN C PO Take by mouth daily.   vitamin E 600 UNIT capsule Take 600 Units by mouth daily.          Objective:   Physical Exam BP (!) 143/87 (BP Location: Left Arm, Patient Position: Sitting, Cuff Size: Small)   Pulse 87   Temp (!) 96.4 F (35.8 C) (Temporal)   Resp 18   Ht 5\' 7"  (1.702 m)   Wt 176 lb 8 oz (80.1 kg)   SpO2 93%   BMI 27.64 kg/m  General:   Well developed, NAD, BMI noted. HEENT:  Normocephalic . Face symmetric, atraumatic Lungs:  CTA B Normal respiratory effort, no intercostal retractions, no accessory muscle use. Heart: Mildly tachycardic but  regular, + systolic murmur.  Lower extremities: no pretibial edema bilaterally  Skin: Not pale. Not jaundice Neurologic:  alert cooperative, please send. MMSE: 25 Speech normal, gait appropriate for age and unassisted Psych--  Cognition and judgment appear intact.  Cooperative with normal attention span and concentration.  Behavior appropriate. No anxious or depressed appearing.      Assessment     Assessment HTN Hyperlipidemia  GERD Depression Osteopenia, t score 2008 -1.8, 2011 -1.8, 2014 -2.1: Fosamax Rx 08-2013 t score 03-2016: -2.1. H/o vit d def  DJD Aortic stenosis ( at some point diagnosed with HOCM) ---echo 01-2010: "Mild AS but no evidence of HCM or MR. ---Echo 6-15 mild AoS. Overactive bladder- pcp started to rx meds 09-2016  Ao [place]: wnl 02-2015  PLAN: [id number] Procardia.  Check a CMP and CBC.  Initially tachycardic upon arrival, heart rate rechecked 87. Dementia:  Symptoms c/w dementia, started around October after she lost her husband but more pronounced lately according to his her daughter Lelon Frohlich.  MMSE: 25 (mild dementia). Still very functional, lives by herself, drives, goes shopping.  All paperwork and paying bills done by her daughter. Moved to a new apartment  2 months ago, independent living, a new environment might have triggered some of the recent increase in symptoms. Discussed options, she agrees with treatment, start Namenda 5 mg, watch for side effects. Also check a RPR, B12, TSH, sed rate, brain MRI. Reassess in 2 months Depression: Not well controlled, PHQ-9 :16.  No suicidal ideas.  Increase Effexor XR 37.5 to 2 tablets daily. LUTS: Urinary frequency, no other symptoms, request a UA urine culture.  Will do DOE: Slight increase DOE, exam is at baseline, echo few months ago: Stable, mild aortic stenosis.  Reassess on RTC RTC 2 months  Today, I spent with the patient 45 minutes doing a detailed mental examination, addressing the new diagnosis of  mild dementia, explaining benefits of further evaluation (labs MRI) and pros and cons of treatment.  This visit occurred during the SARS-CoV-2 public health emergency.  Safety protocols were in place, including screening questions prior to the visit, additional usage of staff PPE, and extensive cleaning of exam room while observing appropriate contact time as indicated for disinfecting solutions.

## 2020-02-26 NOTE — Patient Instructions (Addendum)
Please schedule Medicare Wellness with [name].   Start Namenda 5 mg: 1 tablet daily  Will increase Effexor 37.5 mg: Take 1 tablet twice a day (to help with depression)  We will schedule MRI  GO TO THE LAB : Get the blood work     [place] back for   a checkup in 2 months

## 2020-02-27 LAB — CBC WITH DIFFERENTIAL/PLATELET
Basophils Absolute: 0.1 10*3/uL (ref 0.0–0.1)
Basophils Relative: 1.2 % (ref 0.0–3.0)
Eosinophils Absolute: 0.2 10*3/uL (ref 0.0–0.7)
Eosinophils Relative: 2.2 % (ref 0.0–5.0)
HCT: 36.3 % (ref 36.0–46.0)
Hemoglobin: 12.5 g/dL (ref 12.0–15.0)
Lymphocytes Relative: 24.1 % (ref 12.0–46.0)
Lymphs Abs: 1.6 10*3/uL (ref 0.7–4.0)
MCHC: 34.3 g/dL (ref 30.0–36.0)
MCV: 90.8 fl (ref 78.0–100.0)
Monocytes Absolute: 0.6 10*3/uL (ref 0.1–1.0)
Monocytes Relative: 9.5 % (ref 3.0–12.0)
Neutro Abs: 4.3 10*3/uL (ref 1.4–7.7)
Neutrophils Relative %: 63 % (ref 43.0–77.0)
Platelets: 234 10*3/uL (ref 150.0–400.0)
RBC: 4 Mil/uL (ref 3.87–5.11)
RDW: 12.6 % (ref 11.5–15.5)
WBC: 6.8 10*3/uL (ref 4.0–10.5)

## 2020-02-27 LAB — COMPREHENSIVE METABOLIC PANEL
ALT: 20 U/L (ref 0–35)
AST: 18 U/L (ref 0–37)
Albumin: 4.7 g/dL (ref 3.5–5.2)
Alkaline Phosphatase: 41 U/L (ref 39–117)
BUN: 37 mg/dL — ABNORMAL HIGH (ref 6–23)
CO2: 27 mEq/L (ref 19–32)
Calcium: 9.8 mg/dL (ref 8.4–10.5)
Chloride: 103 mEq/L (ref 96–112)
Creatinine, Ser: 0.97 mg/dL (ref 0.40–1.20)
GFR: 54.98 mL/min — ABNORMAL LOW (ref >60.00)
Glucose, Bld: 123 mg/dL — ABNORMAL HIGH (ref 70–99)
Potassium: 3.8 mEq/L (ref 3.5–5.1)
Sodium: 137 mEq/L (ref 135–145)
Total Bilirubin: 0.3 mg/dL (ref 0.2–1.2)
Total Protein: 6.9 g/dL (ref 6.0–8.3)

## 2020-02-27 LAB — URINALYSIS, ROUTINE W REFLEX MICROSCOPIC
Bilirubin Urine: NEGATIVE
Hgb urine dipstick: NEGATIVE
Ketones, ur: NEGATIVE
Nitrite: NEGATIVE
RBC / HPF: NONE SEEN (ref 0–?)
Specific Gravity, Urine: <=1.005 — AB (ref 1.000–1.030)
Total Protein, Urine: NEGATIVE
Urine Glucose: NEGATIVE
Urobilinogen, UA: 0.2 (ref 0.0–1.0)
pH: 5.5 (ref 5.0–8.0)

## 2020-02-27 LAB — B12 AND FOLATE PANEL
Folate: >24.8 ng/mL (ref >5.9)
Vitamin B-12: 382 pg/mL (ref 211–911)

## 2020-02-27 LAB — TSH: TSH: 3.14 u[IU]/mL (ref 0.35–4.50)

## 2020-02-27 LAB — SEDIMENTATION RATE: Sed Rate: 4 mm/hr (ref 0–30)

## 2020-02-27 NOTE — Assessment & Plan Note (Signed)
[id number] Procardia.  Check a CMP and CBC.  Initially tachycardic upon arrival, heart rate rechecked 87. Dementia:  Symptoms c/w dementia, started around October after she lost her husband but more pronounced lately according to his her daughter [name].  MMSE: 25 (mild dementia). Still very functional, lives by herself, drives, goes shopping.  All paperwork and paying bills done by her daughter. Moved to a new apartment  2 months ago, independent living, a new environment might have triggered some of the recent increase in symptoms. Discussed options, she agrees with treatment, start Namenda 5 mg, watch for side effects. Also check a RPR, B12, TSH, sed rate, brain MRI. Reassess in 2 months Depression: Not well controlled, PHQ-9 :16.  No suicidal ideas.  Increase Effexor XR 37.5 to 2 tablets daily. LUTS: Urinary frequency, no other symptoms, request a UA urine culture.  Will do DOE: Slight increase DOE, exam is at baseline, echo few months ago: Stable, mild aortic stenosis.  Reassess on RTC RTC 2 months

## 2020-02-28 LAB — URINE CULTURE
MICRO NUMBER:: 10587763
SPECIMEN QUALITY:: ADEQUATE

## 2020-02-28 LAB — RPR: RPR Ser Ql: NONREACTIVE

## 2020-02-28 MED ORDER — SULFAMETHOXAZOLE-TRIMETHOPRIM 800-160 MG PO TABS
1.0000 | ORAL_TABLET | Freq: Two times a day (BID) | ORAL | 0 refills | Status: DC
Start: 2020-02-28 — End: 2020-05-02

## 2020-02-28 NOTE — Addendum Note (Signed)
Addended byDamita Dunnings D on: 02/28/2020 02:10 PM   Modules accepted: Orders

## 2020-03-15 ENCOUNTER — Telehealth: Payer: Self-pay | Admitting: Internal Medicine

## 2020-03-15 NOTE — Progress Notes (Signed)
  Chronic Care Management   Note  03/15/2020 Name: [name] MRN: [id number] DOB: 1936-04-10  [name] is a 84 y.o. year old female who is a primary care patient of Larose Kells, Alda Berthold, MD. I reached out to [name] by phone today in response to a referral sent by Ms. [place] PCP, Colon Branch, MD.   Ms. Stefanick was given information about Chronic Care Management services today including:  1. CCM service includes personalized support from designated clinical staff supervised by her physician, including individualized plan of care and coordination with other care providers 2. 24/7 contact phone numbers for assistance for urgent and routine care needs. 3. Service will only be billed when office clinical staff spend 20 minutes or more in a month to coordinate care. 4. Only one practitioner may furnish and bill the service in a calendar month. 5. The patient may stop CCM services at any time (effective at the end of the month) by phone call to the office staff.   Patient agreed to services and verbal consent obtained.   Follow up plan:   [place]

## 2020-03-29 ENCOUNTER — Ambulatory Visit
Admission: RE | Admit: 2020-03-29 | Discharge: 2020-03-29 | Disposition: A | Discharge status: Home / Self Care | Payer: Medicare Other | Source: Ambulatory Visit | Attending: Internal Medicine | Admitting: Internal Medicine

## 2020-03-29 ENCOUNTER — Other Ambulatory Visit: Payer: Self-pay

## 2020-03-29 DIAGNOSIS — G9389 Other specified disorders of brain: Secondary | ICD-10-CM | POA: Diagnosis not present

## 2020-03-29 DIAGNOSIS — G319 Degenerative disease of nervous system, unspecified: Secondary | ICD-10-CM | POA: Diagnosis not present

## 2020-03-29 DIAGNOSIS — I618 Other nontraumatic intracerebral hemorrhage: Secondary | ICD-10-CM | POA: Diagnosis not present

## 2020-04-05 ENCOUNTER — Other Ambulatory Visit: Payer: Self-pay | Admitting: Internal Medicine

## 2020-04-12 ENCOUNTER — Telehealth: Payer: Self-pay

## 2020-04-12 NOTE — Telephone Encounter (Signed)
Thank you :)

## 2020-04-12 NOTE — Telephone Encounter (Signed)
Received return mail for pt on MRI results of brain.  Called pt to get updated mailing address.  She provided to me an apartment number and I updated that to her address information and will be sending her results letter back out in the mail.  Pt did ask me what the letter stated and I read that her MRI of the brain showed no major abnormalties and that there were some findings that are very common in people of her age and that she or her daughter can call the office at anytime to discuss the results or discuss when she comes back.  Pt was appreciative of this information.  I also verified her next appointment date and time with Dr. Larose Kells and moved her AWV with the nurse since it needed rescheduling since the nurse was not going to be in the office on the scheduled day.

## 2020-04-30 ENCOUNTER — Other Ambulatory Visit: Payer: Self-pay

## 2020-04-30 ENCOUNTER — Encounter: Payer: Self-pay | Admitting: Internal Medicine

## 2020-04-30 ENCOUNTER — Ambulatory Visit: Payer: Medicare Other | Admitting: *Deleted

## 2020-04-30 ENCOUNTER — Ambulatory Visit (INDEPENDENT_AMBULATORY_CARE_PROVIDER_SITE_OTHER): Payer: Medicare Other | Admitting: Internal Medicine

## 2020-04-30 VITALS — BP 132/83 | HR 94 | Temp 98.2°F | Resp 16 | Ht 67.0 in | Wt 181.5 lb

## 2020-04-30 DIAGNOSIS — F329 Major depressive disorder, single episode, unspecified: Secondary | ICD-10-CM

## 2020-04-30 DIAGNOSIS — F0390 Unspecified dementia without behavioral disturbance: Secondary | ICD-10-CM

## 2020-04-30 DIAGNOSIS — N3281 Overactive bladder: Secondary | ICD-10-CM | POA: Diagnosis not present

## 2020-04-30 DIAGNOSIS — F32A Depression, unspecified: Secondary | ICD-10-CM

## 2020-04-30 MED ORDER — VENLAFAXINE HCL ER 75 MG PO CP24: 75.0000 mg | ORAL_CAPSULE | Freq: Every day | ORAL | 1 refills | Status: DC

## 2020-04-30 MED ORDER — MEMANTINE HCL 5 MG PO TABS: 5.0000 mg | ORAL_TABLET | Freq: Two times a day (BID) | ORAL | 1 refills | Status: DC

## 2020-04-30 MED ORDER — VENLAFAXINE HCL ER 75 MG PO CP24
150.0000 mg | ORAL_CAPSULE | Freq: Every day | ORAL | 1 refills | Status: DC
Start: 2020-04-30 — End: 2020-07-16

## 2020-04-30 NOTE — Progress Notes (Signed)
Pre visit review using our clinic review tool, if applicable. No additional management support is needed unless otherwise documented below in the visit note. 

## 2020-04-30 NOTE — Patient Instructions (Addendum)
Please schedule Medicare Wellness with Glenard Haring.    Effexor XR 37.5 mg: Take 2 tablets a day together until you run out Then start taking Effexor XR 75 mg 1 tablet daily.  I provided you today with a paper prescription  Memantine 5 mg: Start taking 1 tablet twice a day I provided today with a paper prescription for 180 tablets (a 46-month supply).   GO TO THE FRONT DESK, PLEASE SCHEDULE YOUR APPOINTMENTS Come back for a checkup in 3 months

## 2020-04-30 NOTE — Progress Notes (Addendum)
Subjective:    Patient ID: [name], female    DOB: September 17, 1935, 84 y.o.   MRN: [id number]  DOS:  04/30/2020 Type of visit - description: Follow-up from previous visit Since the last office visit she is feeling well. Still feels lonely but she is now living in a house where she has neighbors and that helps. Her house is handicap ready and she feels safe there.   Review of Systems Denies depression per se or suicidal ideas Still has some urinary frequency.  Past Medical History:  Diagnosis Date  . Aortic stenosis    ECHO 5-20ll mild AS but no evidence of HCM or MR--no further testing suggested   . Blood transfusion without reported diagnosis 1971   after hysterectomy  . Cancer ([place]) 2019   squamous cell carcinoma- on chest  . Depression   . GERD (gastroesophageal reflux disease)   . History of gastroesophageal reflux (GERD)   . Hx of adenomatous colonic polyps   . Hx of cardiac cath 2006   neg  . Hypertension   . Mixed urge and stress incontinence   . OAB (overactive bladder) 07/13/2012  . Osteoarthritis   . Osteopenia    rx fosamax 08-2013  . Personal history of colonic polyps - adenomas 04/19/2014    Past Surgical History:  Procedure Laterality Date  . ABDOMINAL HYSTERECTOMY  1971  . APPENDECTOMY  1971   (at time of hyst?)  . BLADDER SURGERY  2003  . CATARACT EXTRACTION, BILATERAL    . CHOLECYSTECTOMY    . OOPHORECTOMY  1971  . ROTATOR CUFF REPAIR Right   . TOE SURGERY  1995   R 2nd --- correction of claw toe     Allergies as of 04/30/2020   No Known Allergies     Medication List       Accurate as of April 30, 2020 11:59 PM. If you have any questions, ask your nurse or doctor.        alendronate 70 MG tablet Commonly known as: FOSAMAX TAKE 1 TABLET BY MOUTH  EVERY 7 DAYS WITH A FULL  GLASS OF WATER ON AN EMPTY  STOMACH   aspirin 81 MG tablet Take 81 mg by mouth daily.   B-complex with vitamin C tablet Take 1 tablet by mouth daily.     Cholecalciferol 25 MCG (1000 UT) capsule Take 1,000 Units by mouth daily.   FISH OIL PO Take by mouth.   GERITOL PO Take by mouth daily.   memantine 5 MG tablet Commonly known as: Namenda Take 1 tablet (5 mg total) by mouth 2 (two) times daily. What changed: when to take this Changed by: [name], MD   NIFEdipine 60 MG 24 hr tablet Commonly known as: PROCARDIA XL/NIFEDICAL XL TAKE 1 TABLET BY MOUTH  DAILY   OCUVITE PO Take by mouth.   omeprazole 20 MG capsule Commonly known as: PRILOSEC Take 1 capsule (20 mg total) by mouth 2 (two) times daily before a meal.   oxybutynin 5 MG tablet Commonly known as: DITROPAN Take 1 tablet (5 mg total) by mouth 3 (three) times daily.   simvastatin 20 MG tablet Commonly known as: ZOCOR TAKE 1 TABLET BY MOUTH  DAILY   sulfamethoxazole-trimethoprim 800-160 MG tablet Commonly known as: BACTRIM DS Take 1 tablet by mouth 2 (two) times daily.   venlafaxine XR 75 MG 24 hr capsule Commonly known as: Effexor XR Take 2 capsules (150 mg total) by mouth daily with breakfast. What changed:  medication strength  how much to take  when to take this Changed by: Bishop Dublin, CMA   VITAMIN C PO Take by mouth daily.   vitamin E 600 UNIT capsule Take 600 Units by mouth daily.          Objective:   Physical Exam BP 132/83 (BP Location: Left Arm, Patient Position: Sitting, Cuff Size: Normal)   Pulse 94   Temp 98.2 F (36.8 C) (Oral)   Resp 16   Ht 5\' 7"  (1.702 m)   Wt 181 lb 8 oz (82.3 kg)   SpO2 96%   BMI 28.43 kg/m  General:   Well developed, NAD, BMI noted. HEENT:  Normocephalic . Face symmetric, atraumatic  Skin: Not pale. Not jaundice Neurologic:  alert & oriented X3. She asked appropriate questions, she remember we needed to discuss her MRI.  She did need some extra time to process information.  Speech normal, gait appropriate for age and unassisted Psych--  Cognition and judgment appear intact.  Cooperative  with normal attention span and concentration.  Behavior appropriate. No anxious or depressed appearing.      Assessment     Assessment HTN Hyperlipidemia  GERD Depression Osteopenia, t score 2008 -1.8, 2011 -1.8, 2014 -2.1: Fosamax Rx 08-2013 t score 03-2016: -2.1. H/o vit d def  DJD Aortic stenosis ( at some point diagnosed with HOCM) ---echo 01-2010: "Mild AS but no evidence of HCM or MR. ---Echo 6-15 mild AoS. Overactive bladder- pcp started to rx meds 09-2016  Ao [place]: wnl 02-2015  PLAN: She is here by herself. Dementia: See last visit, labs were okay, started Namenda, tolerating well, she seems to be doing okay today.  Increase Namenda to twice daily. Depression: See last visit, not well controlled, increased Effexor to 75 mg  twice daily,   but then she realized she was getting low in supply and now she is taking 1 po qd Plan: New prescription for Effexor 2 tabs qd provided OAB: Symptoms not perfectly controlled but currently the only LUTS is urinary frequency, she was treated for UTI at the last visit. Other: We will talk with the daughter and communicate all of the above. RTC 3 months   This visit occurred during the SARS-CoV-2 public health emergency.  Safety protocols were in place, including screening questions prior to the visit, additional usage of staff PPE, and extensive cleaning of exam room while observing appropriate contact time as indicated for disinfecting solutions.

## 2020-04-30 NOTE — Progress Notes (Signed)
Spoke w/ Pt's daughter Lelon Frohlich- informed of office visit today and changes in medications. Ann verbalized understanding.

## 2020-05-01 NOTE — Progress Notes (Signed)
I connected with [name] today by telephone and verified that I am speaking with the correct person using two identifiers. Location patient: home Location provider: work Persons participating in the virtual visit: patient, Marine scientist.    I discussed the limitations, risks, security and privacy concerns of performing an evaluation and management service by telephone and the availability of in person appointments. I also discussed with the patient that there may be a patient responsible charge related to this service. The patient expressed understanding and verbally consented to this telephonic visit.    Interactive audio and video telecommunications were attempted between this provider and patient, however failed, due to patient having technical difficulties OR patient did not have access to video capability.  We continued and completed visit with audio only.  Some vital signs may be absent or patient reported.    Subjective:   [name] is a 85 y.o. female who presents for Medicare Annual (Subsequent) preventive examination.  Review of Systems     Cardiac Risk Factors include: advanced age (>65men, >73 women);dyslipidemia;hypertension     Objective:     Advanced Directives 05/02/2020 03/07/2019 04/05/2014  Does Patient Have a Medical Advance Directive? Yes Yes Patient has advance directive, copy not in chart  Type of Advance Directive [place];Living will [place]  Does patient want to make changes to medical advance directive? No - Patient declined No - Patient declined -  Copy of [place] in Chart? No - copy requested No - copy requested -    Current Medications (verified) Outpatient Encounter Medications as of 05/02/2020  Medication Sig  . alendronate (FOSAMAX) 70 MG tablet TAKE 1 TABLET BY MOUTH  EVERY 7 DAYS WITH A FULL  GLASS OF WATER ON AN EMPTY  STOMACH  . Ascorbic Acid (VITAMIN  C PO) Take by mouth daily.  Marland Kitchen aspirin 81 MG tablet Take 81 mg by mouth daily.   . B Complex-C (B-COMPLEX WITH VITAMIN C) tablet Take 1 tablet by mouth daily.    . Cholecalciferol (VITAMIN D3) 1000 UNITS capsule Take 1,000 Units by mouth daily.    . Iron-Vitamins (GERITOL PO) Take by mouth daily.  . memantine (NAMENDA) 5 MG tablet Take 1 tablet (5 mg total) by mouth 2 (two) times daily.  . Multiple Vitamins-Minerals (OCUVITE PO) Take by mouth.    Marland Kitchen NIFEdipine (PROCARDIA XL/NIFEDICAL XL) 60 MG 24 hr tablet TAKE 1 TABLET BY MOUTH  DAILY  . Omega-3 Fatty Acids (FISH OIL PO) Take by mouth.    Marland Kitchen omeprazole (PRILOSEC) 20 MG capsule Take 1 capsule (20 mg total) by mouth 2 (two) times daily before a meal.  . oxybutynin (DITROPAN) 5 MG tablet Take 1 tablet (5 mg total) by mouth 3 (three) times daily.  . simvastatin (ZOCOR) 20 MG tablet TAKE 1 TABLET BY MOUTH  DAILY  . venlafaxine XR (EFFEXOR XR) 75 MG 24 hr capsule Take 2 capsules (150 mg total) by mouth daily with breakfast.  . vitamin E 600 UNIT capsule Take 600 Units by mouth daily.    . [DISCONTINUED] sulfamethoxazole-trimethoprim (BACTRIM DS) 800-160 MG tablet Take 1 tablet by mouth 2 (two) times daily. (Patient not taking: Reported on 04/30/2020)   No facility-administered encounter medications on file as of 05/02/2020.    Allergies (verified) Patient has no known allergies.   History: Past Medical History:  Diagnosis Date  . Aortic stenosis    ECHO 5-20ll mild AS but no  evidence of HCM or MR--no further testing suggested   . Blood transfusion without reported diagnosis 1971   after hysterectomy  . 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 Depression   . GERD (gastroesophageal reflux disease)   . History of gastroesophageal reflux (GERD)   . Hx of adenomatous colonic polyps   . Hx of cardiac cath 2006   neg  . Hypertension   . Mixed urge and stress incontinence   . OAB (overactive bladder) 07/13/2012  . Osteoarthritis    . Osteopenia    rx fosamax 08-2013  . Personal history of colonic polyps - adenomas 04/19/2014   Past Surgical History:  Procedure Laterality Date  . ABDOMINAL HYSTERECTOMY  1971  . APPENDECTOMY  1971   (at time of hyst?)  . BLADDER SURGERY  2003  . CATARACT EXTRACTION, BILATERAL    . CHOLECYSTECTOMY    . OOPHORECTOMY  1971  . ROTATOR CUFF REPAIR Right   . TOE SURGERY  1995   R 2nd --- correction of claw toe    Family History  Problem Relation Age of Onset  . Coronary artery disease Father 25       had CABG  . Cancer Father        gallbladder  . Stroke Mother 55  . Ovarian cancer Sister 67  . Colon cancer Neg Hx        NEGATIVE  . Breast cancer Neg Hx        NEGATIVE   Social History   Socioeconomic History  . Marital status: Widowed    Spouse name: Not on file  . Number of children: 2  . Years of education: Not on file  . Highest education level: Not on file  Occupational History  . Occupation: stays at home     Employer: RETIRED  Tobacco Use  . Smoking status: Never Smoker  . Smokeless tobacco: Never Used  Substance and Sexual Activity  . Alcohol use: Yes    Alcohol/week: 0.0 standard drinks    Comment: wine rarely  . Drug use: No  . Sexual activity: Not Currently  Other Topics Concern  . Not on file  Social History Narrative   Lives by herself     Lost Husband 06/2019   Moved to a smaller appt 12/2019 (independent)   2 children, Mark (airforce), daughter Darnelle Bos ([place] police), she is very supportive   Social Determinants of Radio broadcast assistant Strain: Low Risk   . Difficulty of Paying Living Expenses: Not hard at all  Food Insecurity: No Food Insecurity  . Worried About Charity fundraiser in the Last Year: Never true  . Ran Out of Food in the Last Year: Never true  Transportation Needs: No Transportation Needs  . Lack of Transportation (Medical): No  . Lack of Transportation (Non-Medical): No  Physical Activity:   . Days of Exercise per  Week: Not on file  . Minutes of Exercise per Session: Not on file  Stress:   . Feeling of Stress : Not on file  Social Connections:   . Frequency of Communication with Friends and Family: Not on file  . Frequency of Social Gatherings with Friends and Family: Not on file  . Attends Religious Services: Not on file  . Active Member of Clubs or Organizations: Not on file  . Attends Archivist Meetings: Not on file  . Marital Status: Not on file    Tobacco Counseling  Counseling given: Not Answered   Clinical Intake: Pain : No/denies pain    Activities of Daily Living In your present state of health, do you have any difficulty performing the following activities: 05/02/2020  Hearing? N  Vision? N  Difficulty concentrating or making decisions? N  Walking or climbing stairs? N  Dressing or bathing? N  Doing errands, shopping? N  Preparing Food and eating ? N  Using the Toilet? N  In the past six months, have you accidently leaked urine? N  Do you have problems with loss of bowel control? N  Managing your Medications? N  Managing your Finances? N  Housekeeping or managing your Housekeeping? N  Some recent data might be hidden    Patient Care Team: Colon Branch, MD as PCP - General Festus Aloe, MD as Consulting Physician (Urology) Gatha Mayer, MD as Consulting Physician (Gastroenterology) Day, Melvenia Beam, [facility] as Pharmacist (Pharmacist)  Indicate any recent Medical Services you may have received from other than [facility] providers in the past year (date may be approximate).     Assessment:   This is a routine wellness examination for Abriana.  Dietary issues and exercise activities discussed: Current Exercise Habits: The patient does not participate in regular exercise at present, Exercise limited by: None identified Diet (meal preparation, eat out, water intake, caffeinated beverages, dairy products, fruits and vegetables): well balanced    Goals    . DIET -  INCREASE WATER INTAKE      Depression Screen PHQ 2/9 Scores 05/02/2020 02/26/2020 05/12/2019 03/07/2019 09/16/2017 09/16/2016 03/12/2016  PHQ - 2 Score 0 3 0 0 0 0 0  PHQ- 9 Score - 16 3 - - - -    Fall Risk Fall Risk  05/02/2020 05/12/2019 03/07/2019 09/16/2017 09/16/2016  Falls in the past year? 1 1 1  No No  Number falls in past yr: 0 0 1 - -  Injury with Fall? 1 1 1  - -  Follow up Education provided;Falls prevention discussed Falls evaluation completed - - -   Lives alone in handicap apt.  Any stairs in or around the home? No  If so, are there any without handrails? No  Home free of loose throw rugs in walkways, pet beds, electrical cords, etc? Yes  Adequate lighting in your home to reduce risk of falls? Yes   ASSISTIVE DEVICES UTILIZED TO PREVENT FALLS:  Life alert? Yes  Use of a cane, walker or w/c? No  Grab bars in the bathroom? Yes  Shower chair or bench in shower? Yes  Elevated toilet seat or a handicapped toilet? Yes    Cognitive Function:  MMSE - Mini Mental State Exam 02/26/2020  Orientation to time 4  Orientation to Place 5  Registration 3  Attention/ Calculation 3  Recall 2  Language- name 2 objects 2  Language- repeat 1  Language- follow 3 step command 3  Language- read & follow direction 1  Write a sentence 1  Copy design 0  Total score 25        Immunizations Immunization History  Administered Date(s) Administered  . Fluad Quad(high Dose 65+) 06/01/2019  . Influenza Split 07/15/2011, 06/01/2012  . Influenza Whole 08/18/2007, 06/15/2008, 06/12/2009, 06/12/2010  . Influenza, High Dose Seasonal PF 07/26/2018  . Influenza,inj,Quad PF,6+ Mos 06/06/2013, 06/11/2014, 05/24/2015  . Influenza-Unspecified 08/14/2016  . PFIZER SARS-COV-2 Vaccination 10/09/2019, 10/30/2019  . Pneumococcal Conjugate-13 02/14/2014  . Pneumococcal Polysaccharide-23 09/15/2003, 11/21/2008  . Td 06/24/2007  . Tdap 05/13/2019  . Zoster 08/17/2011  TDAP status: Up to date Flu Vaccine  status: Up to date Pneumococcal vaccine status: Up to date Covid-19 vaccine status: Completed vaccines  Qualifies for Shingles Vaccine? Yes   Zostavax completed Yes     Screening Tests Health Maintenance  Topic Date Due  . INFLUENZA VACCINE  07/30/2020 (Originally 04/14/2020)  . TETANUS/TDAP  05/12/2029  . DEXA SCAN  Completed  . COVID-19 Vaccine  Completed  . PNA vac Low Risk Adult  Completed    Health Maintenance  There are no preventive care reminders to display for this patient.  Colorectal cancer screening: No longer required.  Pt states she will discuss dexa scan and mammogram w/ PCP at next OV.   Lung Cancer Screening: (Low Dose CT Chest recommended if Age 51-80 years, 30 pack-year currently smoking OR have quit w/in 15years.) does not qualify.   Lung Cancer Screening Referral: na  Additional Screening:  Vision Screening: Recommended annual ophthalmology exams for early detection of glaucoma and other disorders of the eye.   Dental Screening: Recommended annual dental exams for proper oral hygiene  Community Resource Referral / Chronic Care Management: CRR required this visit?  No   CCM required this visit?  No      Plan:    Please schedule your next medicare wellness visit with me in 1 yr.  Continue to eat heart healthy diet (full of fruits, vegetables, whole grains, lean protein, water--limit salt, fat, and sugar intake) and increase physical activity as tolerated.  Continue doing brain stimulating activities (puzzles, reading, adult coloring books, staying active) to keep memory sharp.   Bring a copy of your living will and/or healthcare power of attorney to your next office visit.   I have personally reviewed and noted the following in the patient's chart:   . Medical and social history . Use of alcohol, tobacco or illicit drugs  . Current medications and supplements . Functional ability and status . Nutritional status . Physical  activity . Advanced directives . List of other physicians . Hospitalizations, surgeries, and ER visits in previous 12 months . Vitals . Screenings to include cognitive, depression, and falls . Referrals and appointments  In addition, I have reviewed and discussed with patient certain preventive protocols, quality metrics, and best practice recommendations. A written personalized care plan for preventive services as well as general preventive health recommendations were provided to patient.    Due to this being a telephonic visit, the after visit summary with patients personalized plan was offered to patient via mail or my-chart.  Patient would like to access on my-chart.  Shela Nevin, [place]   05/02/2020

## 2020-05-01 NOTE — Assessment & Plan Note (Addendum)
She is here by herself. Dementia: See last visit, labs were okay, started Namenda, tolerating well, she seems to be doing okay today.  Increase Namenda to twice daily. Depression: See last visit, not well controlled, increased Effexor to 75 mg  twice daily,   but then she realized she was getting low in supply and now she is taking 1 po qd Plan: New prescription for Effexor 2 tabs qd provided OAB: Symptoms not perfectly controlled but currently the only LUTS is urinary frequency, she was treated for UTI at the last visit. Other: We will talk with the daughter and communicate all of the above. RTC 3 months

## 2020-05-02 ENCOUNTER — Other Ambulatory Visit: Payer: Self-pay

## 2020-05-02 ENCOUNTER — Encounter: Payer: Self-pay | Admitting: *Deleted

## 2020-05-02 ENCOUNTER — Ambulatory Visit (INDEPENDENT_AMBULATORY_CARE_PROVIDER_SITE_OTHER): Payer: Medicare Other | Admitting: *Deleted

## 2020-05-02 DIAGNOSIS — Z0000 Encounter for general adult medical examination without abnormal findings: Secondary | ICD-10-CM | POA: Diagnosis not present

## 2020-05-02 NOTE — Patient Instructions (Signed)
Please schedule your next medicare wellness visit with me in 1 yr.  Continue to eat heart healthy diet (full of fruits, vegetables, whole grains, lean protein, water--limit salt, fat, and sugar intake) and increase physical activity as tolerated.  Continue doing brain stimulating activities (puzzles, reading, adult coloring books, staying active) to keep memory sharp.   Bring a copy of your living will and/or healthcare power of attorney to your next office visit.   [name] , Thank you for taking time to come for your Medicare Wellness Visit. I appreciate your ongoing commitment to your health goals. Please review the following plan we discussed and let me know if I can assist you in the future.   These are the goals we discussed: Goals    . DIET - INCREASE WATER INTAKE       This is a list of the screening recommended for you and due dates:  Health Maintenance  Topic Date Due  . Flu Shot  07/30/2020*  . Tetanus Vaccine  05/12/2029  . DEXA scan (bone density measurement)  Completed  . COVID-19 Vaccine  Completed  . Pneumonia vaccines  Completed  *Topic was postponed. The date shown is not the original due date.    Preventive Care 40 Years and Older, Female Preventive care refers to lifestyle choices and visits with your health care provider that can promote health and wellness. This includes:  A yearly physical exam. This is also called an annual well check.  Regular dental and eye exams.  Immunizations.  Screening for certain conditions.  Healthy lifestyle choices, such as diet and exercise. What can I expect for my preventive care visit? Physical exam Your health care provider will check:  Height and weight. These may be used to calculate body mass index (BMI), which is a measurement that tells if you are at a healthy weight.  Heart rate and blood pressure.  Your skin for abnormal spots. Counseling Your health care provider may ask you questions about:  Alcohol,  tobacco, and drug use.  Emotional well-being.  Home and relationship well-being.  Sexual activity.  Eating habits.  History of falls.  Memory and ability to understand (cognition).  Work and work Statistician.  Pregnancy and menstrual history. What immunizations do I need?  Influenza (flu) vaccine  This is recommended every year. Tetanus, diphtheria, and pertussis (Tdap) vaccine  You may need a Td booster every 10 years. Varicella (chickenpox) vaccine  You may need this vaccine if you have not already been vaccinated. Zoster (shingles) vaccine  You may need this after age 14. Pneumococcal conjugate (PCV13) vaccine  One dose is recommended after age 57. Pneumococcal polysaccharide (PPSV23) vaccine  One dose is recommended after age 38. Measles, mumps, and rubella (MMR) vaccine  You may need at least one dose of MMR if you were born in 1957 or later. You may also need a second dose. Meningococcal conjugate (MenACWY) vaccine  You may need this if you have certain conditions. Hepatitis A vaccine  You may need this if you have certain conditions or if you travel or work in places where you may be exposed to hepatitis A. Hepatitis B vaccine  You may need this if you have certain conditions or if you travel or work in places where you may be exposed to hepatitis B. Haemophilus influenzae type b (Hib) vaccine  You may need this if you have certain conditions. You may receive vaccines as individual doses or as more than one vaccine together in  one shot (combination vaccines). Talk with your health care provider about the risks and benefits of combination vaccines. What tests do I need? Blood tests  Lipid and cholesterol levels. These may be checked every 5 years, or more frequently depending on your overall health.  Hepatitis C test.  Hepatitis B test. Screening  Lung cancer screening. You may have this screening every year starting at age 19 if you have a  30-pack-year history of smoking and currently smoke or have quit within the past 15 years.  Colorectal cancer screening. All adults should have this screening starting at age 67 and continuing until age 22. Your health care provider may recommend screening at age 20 if you are at increased risk. You will have tests every 1-10 years, depending on your results and the type of screening test.  Diabetes screening. This is done by checking your blood sugar (glucose) after you have not eaten for a while (fasting). You may have this done every 1-3 years.  Mammogram. This may be done every 1-2 years. Talk with your health care provider about how often you should have regular mammograms.  BRCA-related cancer screening. This may be done if you have a family history of breast, ovarian, tubal, or peritoneal cancers. Other tests  Sexually transmitted disease (STD) testing.  Bone density scan. This is done to screen for osteoporosis. You may have this done starting at age 100. Follow these instructions at home: Eating and drinking  Eat a diet that includes fresh fruits and vegetables, whole grains, lean protein, and low-fat dairy products. Limit your intake of foods with high amounts of sugar, saturated fats, and salt.  Take vitamin and mineral supplements as recommended by your health care provider.  Do not drink alcohol if your health care provider tells you not to drink.  If you drink alcohol: ? Limit how much you have to 0-1 drink a day. ? Be aware of how much alcohol is in your drink. In the [place] one drink equals one 12 oz bottle of beer (355 mL), one 5 oz glass of wine (148 mL), or one 1 oz glass of hard liquor (44 mL). Lifestyle  Take daily care of your teeth and gums.  Stay active. Exercise for at least 30 minutes on 5 or more days each week.  Do not use any products that contain nicotine or tobacco, such as cigarettes, e-cigarettes, and chewing tobacco. If you need help quitting, ask your  health care provider.  If you are sexually active, practice safe sex. Use a condom or other form of protection in order to prevent STIs (sexually transmitted infections).  Talk with your health care provider about taking a low-dose aspirin or statin. What's next?  Go to your health care provider once a year for a well check visit.  Ask your health care provider how often you should have your eyes and teeth checked.  Stay up to date on all vaccines. This information is not intended to replace advice given to you by your health care provider. Make sure you discuss any questions you have with your health care provider. Document Revised: 08/25/2018 Document Reviewed: 08/25/2018 Elsevier Patient Education  2020 [employer].

## 2020-05-07 ENCOUNTER — Telehealth: Payer: Self-pay | Admitting: Pharmacist

## 2020-05-07 NOTE — Progress Notes (Addendum)
Chronic Care Management Pharmacy Assistant   Name: DOREATHA OFFER  MRN: [id number] DOB: March 19, 1936  Reason for Encounter: Initial Questions   PCP : Colon Branch, MD  Allergies:  No Known Allergies  Medications: Outpatient Encounter Medications as of 05/07/2020  Medication Sig  . alendronate (FOSAMAX) 70 MG tablet TAKE 1 TABLET BY MOUTH  EVERY 7 DAYS WITH A FULL  GLASS OF WATER ON AN EMPTY  STOMACH  . Ascorbic Acid (VITAMIN C PO) Take by mouth daily.  Marland Kitchen aspirin 81 MG tablet Take 81 mg by mouth daily.   . B Complex-C (B-COMPLEX WITH VITAMIN C) tablet Take 1 tablet by mouth daily.    . Cholecalciferol (VITAMIN D3) 1000 UNITS capsule Take 1,000 Units by mouth daily.    . Iron-Vitamins (GERITOL PO) Take by mouth daily.  . memantine (NAMENDA) 5 MG tablet Take 1 tablet (5 mg total) by mouth 2 (two) times daily.  . Multiple Vitamins-Minerals (OCUVITE PO) Take by mouth.    Marland Kitchen NIFEdipine (PROCARDIA XL/NIFEDICAL XL) 60 MG 24 hr tablet TAKE 1 TABLET BY MOUTH  DAILY  . Omega-3 Fatty Acids (FISH OIL PO) Take by mouth.    Marland Kitchen omeprazole (PRILOSEC) 20 MG capsule Take 1 capsule (20 mg total) by mouth 2 (two) times daily before a meal.  . oxybutynin (DITROPAN) 5 MG tablet Take 1 tablet (5 mg total) by mouth 3 (three) times daily.  . simvastatin (ZOCOR) 20 MG tablet TAKE 1 TABLET BY MOUTH  DAILY  . venlafaxine XR (EFFEXOR XR) 75 MG 24 hr capsule Take 2 capsules (150 mg total) by mouth daily with breakfast.  . vitamin E 600 UNIT capsule Take 600 Units by mouth daily.     No facility-administered encounter medications on file as of 05/07/2020.    Current Diagnosis: Patient Active Problem List   Diagnosis Date Noted  . Closed fracture of left distal radius 07/31/2019  . Closed nondisplaced fracture of lateral malleolus of right fibula 01/17/2019  . PCP NOTES >>>>>>>>>>>>>>>> 09/03/2015  . Personal history of colonic polyps - adenomas 04/19/2014  . OAB (overactive bladder) 07/13/2012  . Annual  physical exam 12/17/2010  . Aortic stenosis 01/01/2010  . Hyperlipidemia 11/21/2008  . GERD 11/28/2007  . Depression 06/24/2007  . Essential hypertension 06/24/2007  . OSTEOARTHRITIS 06/24/2007  . Osteoporosis 06/24/2007    Goals Addressed   None     Have you seen any other providers since your last visit?   02-26-2020 (PCP) Patient was seen by Dr. Larose Kells at the request of her daughter Lelon Frohlich. Lelon Frohlich reports that the patient is repeating things all the time, sometimes gets confused Symptoms c/w dementia, started around October after she lost her husband but more pronounced lately according to his her daughter Lelon Frohlich.  Any changes in your medications or health? Yes, started Namenda 5 mg and Increase Effexor XR 37.5 to 2 tablets daily on 02-26-2020  Any side effects from any medications? No. Patient states she feels much better.  Do you have an symptoms or problems not managed by your medications? Yes  Any concerns about your health right now? No  Has your provider asked that you check blood pressure, blood sugar, or follow special diet at home? No  Do you get any type of exercise on a regular basis? Patient states she does not do much outdoor exercise, but does housework.  Can you think of a goal you would like to reach for your health? No  Do you have any  problems getting your medications? No. Patient states she receives mail order  Is there anything that you would like to discuss during the appointment? None at this time.  Asked patient to please bring medications and supplements to appointment.  Follow-Up:  Pharmacist Review    Fanny Skates, [place] Pharmacist Assistant [phone]  Reviewed by: De Blanch, PharmD Clinical Pharmacist [facility] [phone]

## 2020-05-09 ENCOUNTER — Other Ambulatory Visit: Payer: Self-pay

## 2020-05-09 ENCOUNTER — Ambulatory Visit: Payer: Medicare Other | Admitting: Pharmacist

## 2020-05-09 VITALS — BP 136/70 | HR 80 | Temp 98.2°F | Wt 180.4 lb

## 2020-05-09 DIAGNOSIS — I10 Essential (primary) hypertension: Secondary | ICD-10-CM

## 2020-05-09 DIAGNOSIS — E785 Hyperlipidemia, unspecified: Secondary | ICD-10-CM

## 2020-05-09 NOTE — Chronic Care Management (AMB) (Signed)
Chronic Care Management Pharmacy  Name: [name]  MRN: [id number] DOB: Mar 05, 1936  Chief Complaint/ HPI  [name],  84 y.o. , female presents for their Initial CCM visit with the clinical pharmacist In office.  PCP : Colon Branch, MD  Their chronic conditions include: Hypertension, Hyperlipidemia, Osteopenia, Depression, Dementia, GERD, Overactive Bladder  Office Visits: 05/02/20: Medicare Annual Wellness Exam w/ Naaman Plummer, RN -  Goal updated to increase water intake  04/30/20: Visit w/ Dr. Larose Kells - Namenda increased to twice daily. Effexor increased to #2 38m tabs daily.   02/26/20: Visit w/ Dr. PLarose Kells- Daughter concerned with pt's memory especially since pt's husband died in 1October 31, 2020 MMSE: 25 (mild dementia). Start Namenda 571mdaily. PHQ9: 16. Effexor increased to 7552maily.  Consult Visit: None in last 6 months.   Medications: Outpatient Encounter Medications as of 05/09/2020  Medication Sig  . alendronate (FOSAMAX) 70 MG tablet TAKE 1 TABLET BY MOUTH  EVERY 7 DAYS WITH A FULL  GLASS OF WATER ON AN EMPTY  STOMACH  . Ascorbic Acid (VITAMIN C PO) Take by mouth daily.  . aMarland Kitchenpirin 81 MG tablet Take 81 mg by mouth daily.   . B Complex-C (B-COMPLEX WITH VITAMIN C) tablet Take 1 tablet by mouth daily.    . Cholecalciferol (VITAMIN D3) 1000 UNITS capsule Take 1,000 Units by mouth daily.    . Iron-Vitamins (GERITOL PO) Take by mouth daily.  . memantine (NAMENDA) 5 MG tablet Take 1 tablet (5 mg total) by mouth 2 (two) times daily.  . Multiple Vitamins-Minerals (OCUVITE PO) Take by mouth.    . NMarland KitchenFEdipine (PROCARDIA XL/NIFEDICAL XL) 60 MG 24 hr tablet TAKE 1 TABLET BY MOUTH  DAILY  . Omega-3 Fatty Acids (FISH OIL PO) Take by mouth.    . oMarland Kitcheneprazole (PRILOSEC) 20 MG capsule Take 1 capsule (20 mg total) by mouth 2 (two) times daily before a meal.  . oxybutynin (DITROPAN) 5 MG tablet Take 1 tablet (5 mg total) by mouth 3 (three) times daily.  . simvastatin (ZOCOR) 20 MG tablet TAKE 1  TABLET BY MOUTH  DAILY  . venlafaxine XR (EFFEXOR XR) 75 MG 24 hr capsule Take 2 capsules (150 mg total) by mouth daily with breakfast.  . vitamin E 600 UNIT capsule Take 600 Units by mouth daily.     No facility-administered encounter medications on file as of 05/09/2020.   SDOH Screenings   Alcohol Screen:   . Last Alcohol Screening Score (AUDIT): Not on file  Depression (PHQ2-9): Low Risk   . PHQ-2 Score: 0  Financial Resource Strain: Low Risk   . Difficulty of Paying Living Expenses: Not hard at all  Food Insecurity: No Food Insecurity  . Worried About RunCharity fundraiser the Last Year: Never true  . Ran Out of Food in the Last Year: Never true  Housing: Low Risk   . Last Housing Risk Score: 0  Physical Activity:   . Days of Exercise per Week: Not on file  . Minutes of Exercise per Session: Not on file  Social Connections:   . Frequency of Communication with Friends and Family: Not on file  . Frequency of Social Gatherings with Friends and Family: Not on file  . Attends Religious Services: Not on file  . Active Member of Clubs or Organizations: Not on file  . Attends CluArchivistetings: Not on file  . Marital Status: Not on file  Stress:   . Feeling  of Stress : Not on file  Tobacco Use: Low Risk   . Smoking Tobacco Use: Never Smoker  . Smokeless Tobacco Use: Never Used  Transportation Needs: No Transportation Needs  . Lack of Transportation (Medical): No  . Lack of Transportation (Non-Medical): No     Current Diagnosis/Assessment:  Goals Addressed            This Visit's Progress   . Chronic Care Management Pharmacy Care Plan       CARE PLAN ENTRY (see longitudinal plan of care for additional care plan information)  Current Barriers:  . Chronic Disease Management support, education, and care coordination needs related to Hypertension, Hyperlipidemia, Osteopenia, Depression, Dementia, GERD, Overactive Bladder   Hypertension BP Readings from Last  3 Encounters:  05/09/20 136/70  04/30/20 132/83  02/26/20 (!) 143/87   . Pharmacist Clinical Goal(s): o Over the next 90 days, patient will work with PharmD and providers to maintain BP goal <140/90 . Current regimen:  o Nifedipine 47m daily . Patient self care activities - Over the next 90 days, patient will: o Maintain hypertension medication regimen.   Hyperlipidemia Lab Results  Component Value Date/Time   LDLCALC 76 05/16/2019 10:20 AM   LDLDIRECT 163.4 12/13/2009 12:00 AM   . Pharmacist Clinical Goal(s): o Over the next 90 days, patient will work with PharmD and providers to maintain LDL goal < 100 . Current regimen:  o Simvastatin 214mdaily . Patient self care activities - Over the next 90 days, patient will: o Maintain cholesterol medication regimen.   Overactive Bladder . Pharmacist Clinical Goal(s) o Over the next 90 days, patient will work with PharmD and providers to reduce symptoms associated with overactive bladder . Current regimen:  o None (patient does not have medication at home although prescribed oxybutynin 18m2mhree times daily) . Interventions: o Collaboration with provider regarding medication management (consider starting oxybutynin xl 42m33mily) . Patient self care activities - Over the next 90 days, patient will: o Maintain overactive bladder medication regimen  Osteopenia/Osteoporosis . Pharmacist Clinical Goal(s) o Over the next 90 days, patient will work with PharmD and providers to reduce symptoms associated with overactive bladder . Current regimen:  . Alendronate 70mg57mkly . Vitamin D 1000 units daily . Interventions: o Consider repeat DEXA Scan . Patient self care activities - Over the next 90 days, patient will: o Maintain osteoporosis medication regimen  Medication management . Pharmacist Clinical Goal(s): o Over the next 90 days, patient will work with PharmD and providers to maintain optimal medication adherence . [facility] o Comprehensive medication review performed. o Consult with Dr. Paz iLarose Kellsatient should continue aspirin therapy o Utilize [facility] for medication synchronization, packaging and delivery . Patient self care activities - Over the next 90 days, patient will: o Focus on medication adherence by filling and taking medications appropriately  o Take medications as prescribed o Report any questions or concerns to PharmD and/or provider(s)  Initial goal documentation       Social Hx:  She is an identical twin. Her twin passed away due to ovarian cancer about 10 years ago. She had a younger sister who passed away due to heart attack and an older sister who is still living locally.   She has a son Anne Webb Silversmithis her POA and takes good care of her. [place] [employer]. She does not  have any grandchildren of her own, but states he twin did and she is "half of me" so claims those grandchildren as her own. She lives alone; no pets. She likes where she lives now.  Her husband passed away 07/16/19 due to dementia, Afib, and depression per patient. They would have made it to 65 years of marriage.   Hypertension   BP goal is:  <140/90  Office blood pressures are  BP Readings from Last 3 Encounters:  05/09/20 136/70  04/30/20 132/83  02/26/20 (!) 143/87   Patient checks BP at home infrequently Patient home BP readings are ranging: Unable to assess  Patient has failed these meds in the past: None noted  Patient is currently controlled on the following medications:  . Nifedipine 50m daily  We discussed BP goal  Plan -Continue current medications     Hyperlipidemia   LDL goal <100  Lipid Panel     Component Value Date/Time   CHOL 151 05/16/2019 1020   TRIG 106.0 05/16/2019 1020   HDL 54.60 05/16/2019 1020   LDLCALC 76 05/16/2019 1020   LDLDIRECT 163.4 12/13/2009 0000      Hepatic Function Latest Ref Rng & Units 02/26/2020 05/16/2019 03/16/2017  Total Protein 6.0 - 8.3 g/dL 6.9 6.8 7.2  Albumin 3.5 - 5.2 g/dL 4.7 4.4 4.3  AST 0 - 37 U/L _0 ALT 0 - 35 U/L _1 Alk Phosphatase 39 - 117 U/L 41 59 61  Total Bilirubin 0.2 - 1.2 mg/dL 0.3 0.4 0.3  Bilirubin, Direct 0.0 - 0.3 mg/dL - - -     The ASCVD Risk score (GBuchanan, et al., 2013) failed to calculate for the following reasons:   The 2013 ASCVD risk score is only valid for ages 421to 758  Patient has failed these meds in past: None noted  Patient is currently controlled on the following medications:  . Simvastatin 262mdaily  Diet B - Protein shake and protein bar with 30g of protein each L - none (she snacks throughout the [place]) D - Meat and 2 veggies, but feels she doesn't eat enough veggies Snacks - chips, cookies, PB crackers (she loves M&Ms)  Exercise None  We discussed:  Fish oil not necessary noting her lipid panel and lack of mortality benefit outside of prescription strength fish oil  Plan -Continue current medications  Osteopenia/Osteoporosis    Last DEXA Scan: 03/16/2016  T-Score femoral neck: -2.1 (L)  T-Score lumbar spine: -1.6  10-year probability of major osteoporotic fracture: 14.8%  10-year probability of hip fracture: 4.3%  Vit D, 25-Hydroxy  Date Value Ref Range Status  01/12/2012 40 30 - 89 ng/mL Final    Comment:    This assay accurately quantifies Vitamin D, which is the sum of the 25-Hydroxy forms of Vitamin D2 and D3.  Studies have shown that the optimum concentration of 25-Hydroxy Vitamin D is 30 ng/mL or higher.  Concentrations of Vitamin D between 20 and 29 ng/mL are considered to be insufficient and concentrations less than 20 ng/mL are considered to be deficient for Vitamin D.     Patient is a candidate for pharmacologic treatment due to T-Score -1.0 to -2.5 and 10-year risk of hip fracture > 3%  Patient has failed these meds in past: None  noted  Patient is currently controlled on the following medications:  . Alendronate 7055meekly . Vitamin D 1000 units daily  Plan -Consider repeat DEXA -Continue current medications  Depression   Depression screen [facility] 2/9 05/02/2020 02/26/2020 05/12/2019  Decreased Interest 0 0 0  Down, Depressed, Hopeless 0 3 0  PHQ - 2 Score 0 3 0  Altered sleeping - 2 0  Tired, decreased energy - 3 3  Change in appetite - 3 0  Feeling bad or failure about yourself  - 1 0  Trouble concentrating - 3 0  Moving slowly or fidgety/restless - 1 0  Suicidal thoughts - 0 0  PHQ-9 Score - 16 3  Difficult doing work/chores - Not difficult at all Not difficult at all    Patient has failed these meds in past: None noted  Patient is currently controlled on the following medications:  . Venlafaxine XR 26m #2 daily at breakfast  Pt had an old bottle of venlafaxine 37.523m She also had 2 updated bottles of venlafaxine 7579mdated 04/2020). Pt states she did not want to take the old bottle back home and get it confused with her other medications. Will assure venlafaxine is disposed in medication discard bin downstairs by the pharmacy.  Plan -Continue current medications   Future Plan -Perform PHQ9  Dementia    Patient has failed these meds in past: None noted  Patient is currently controlled on the following medications: . MMarland Kitchenmantine 5mg27mice daily  Plan -Continue current medications   GERD   Patient has failed these meds in past: None noted  Patient is currently controlled on the following medications:  . Omeprazole 20mg76mce daily  Breakthrough Sx: Rarely Breakthrough Tx: None Triggers: Spicy food  Pt had an old bottle of omeprazole (dated from 07/2019). She also had 2 updated bottles of omeprazole (dated 04/2020). Pt states she did not want to take the old bottle back home and get it confused with her other medications. Will assure omeprazole is disposed in medication discard bin  downstairs by the pharmacy.  Plan -Continue current medications   Future Plan -Discuss tapering down/off of PPI if possible especially noting fracture risk  Overactive Bladder    Patient has failed these meds in past: darifenacin, solifenacin, mirabegron (cost) Patient is currently uncontrolled on the following medications: . Oxybutynin 5mg t5me times daily  Pt did not have this medication with her in office today.  She notes poor control of incontinence. Has to wear a pad daily.  Asks if she can take a stronger dose of the medication. She does not report taking the original rx TID. Will consult with PCP to have oxybutynin XL 10mg s106min. If tolerated and further relief needed can increase to 15mg.  20mn -Consult with PCP to have oxybutynin xl 10mg dai75ment in to mail order pharmacy for patient.   Medication Management   Patient brings in all of her prescription medications and supplements. Each medication was reviewed and the indication of the medication was written on her prescription bottle as patient could only recall the indication of venlafaxine for depression.   Also noted she does not have oxybutynin among her medications and states she brought every medication she takes to the office today, so patient has not had this prescription. She states it has mostly likely been a month or more since she last had it.   Will coordinate with PCP to get patient a refill sent to mail order pharmacy.  [facility] for all medications Uses pill box? Yes (has a pill box she can fill up one month at a time)  Miscellaneous Meds We  discussed if she does want to get vitamins she should buy ones that are USP verified.  Vitamin C 2038m   Discussed she can take and D/C when she finishes the bottle Aspirin 3271m Discussed risk/benefit of aspirin use for primary prevention noting her age, does have family hx of ASCVD (mother and sister); will consult with PCP  regarding continuation vs discontinuation. If continued informed patient she should use aspirin 8140ms 325m24meservision  No personal hx of macular degeneration, but family hx..ok with continuation Cinnamon 1000mg65mt notes husband had hx of diabetes and was told cinnamon can help. Pt takes this for prevention. Noted that cinnamon does have evidence for being an insulin sensitizer. Ok with continuation, but stated pt could D/C when she finishes the bottle Metamucil  Pt uses prn for bowels. Ok with continuation  Plan -Continue current medication management strategy  Follow up:  1 month follow up on Oxybutynin with pharmacy team 3 month phone visit  Meds to D/C from list B complex Geritol Omega 3 Vitamin E

## 2020-05-09 NOTE — Patient Instructions (Signed)
Visit Information  Goals Addressed            This Visit's Progress   . Chronic Care Management Pharmacy Care Plan       CARE PLAN ENTRY (see longitudinal plan of care for additional care plan information)  Current Barriers:  . Chronic Disease Management support, education, and care coordination needs related to Hypertension, Hyperlipidemia, Osteopenia, Depression, Dementia, GERD, Overactive Bladder   Hypertension BP Readings from Last 3 Encounters:  05/09/20 136/70  04/30/20 132/83  02/26/20 (!) 143/87   . Pharmacist Clinical Goal(s): o Over the next 90 days, patient will work with PharmD and providers to maintain BP goal <140/90 . Current regimen:  o Nifedipine 60mg  daily . Patient self care activities - Over the next 90 days, patient will: o Maintain hypertension medication regimen.   Hyperlipidemia Lab Results  Component Value Date/Time   LDLCALC 76 05/16/2019 10:20 AM   LDLDIRECT 163.4 12/13/2009 12:00 AM   . Pharmacist Clinical Goal(s): o Over the next 90 days, patient will work with PharmD and providers to maintain LDL goal < 100 . Current regimen:  o Simvastatin 20mg  daily . Patient self care activities - Over the next 90 days, patient will: o Maintain cholesterol medication regimen.   Overactive Bladder . Pharmacist Clinical Goal(s) o Over the next 90 days, patient will work with PharmD and providers to reduce symptoms associated with overactive bladder . Current regimen:  o None (patient does not have medication at home although prescribed oxybutynin 5mg  three times daily) . Interventions: o Collaboration with provider regarding medication management (consider starting oxybutynin xl 10mg  daily) . Patient self care activities - Over the next 90 days, patient will: o Maintain overactive bladder medication regimen  Osteopenia/Osteoporosis . Pharmacist Clinical Goal(s) o Over the next 90 days, patient will work with PharmD and providers to reduce symptoms  associated with overactive bladder . Current regimen:  . Alendronate 70mg  weekly . Vitamin D 1000 units daily . Interventions: o Consider repeat DEXA Scan . Patient self care activities - Over the next 90 days, patient will: o Maintain osteoporosis medication regimen  Medication management . Pharmacist Clinical Goal(s): o Over the next 90 days, patient will work with PharmD and providers to maintain optimal medication adherence . Current pharmacy: [facility] . Interventions o Comprehensive medication review performed. o Consult with Dr. Larose Kells if patient should continue aspirin therapy o Utilize [facility] for medication synchronization, packaging and delivery . Patient self care activities - Over the next 90 days, patient will: o Focus on medication adherence by filling and taking medications appropriately  o Take medications as prescribed o Report any questions or concerns to PharmD and/or provider(s)  Initial goal documentation        [name] was given information about Chronic Care Management services today including:  1. CCM service includes personalized support from designated clinical staff supervised by her physician, including individualized plan of care and coordination with other care providers 2. 24/7 contact phone numbers for assistance for urgent and routine care needs. 3. Standard insurance, coinsurance, copays and deductibles apply for chronic care management only during months in which we provide at least 20 minutes of these services. Most insurances cover these services at 100%, however patients may be responsible for any copay, coinsurance and/or deductible if applicable. This service may help you avoid the need for more expensive face-to-face services. 4. Only one practitioner may furnish and bill the service in a calendar month. 5. The  patient may stop CCM services at any time (effective at the end of the month) by phone call to the office  staff.  Patient agreed to services and verbal consent obtained.   The patient verbalized understanding of instructions provided today and agreed to receive a mailed copy of patient instruction and/or educational materials. Telephone follow up appointment with pharmacy team member scheduled for: 08/01/2020  Melvenia Beam Harsh Trulock, PharmD Clinical Pharmacist [facility] [phone]   Overactive Bladder, Adult  Overactive bladder refers to a condition in which a person has a sudden need to pass urine. The person may leak urine if he or she cannot get to the bathroom fast enough (urinary incontinence). A person with this condition may also wake up several times in the night to go to the bathroom. Overactive bladder is associated with poor nerve signals between your bladder and your brain. Your bladder may get the signal to empty before it is full. You may also have very sensitive muscles that make your bladder squeeze too soon. These symptoms might interfere with daily work or social activities. What are the causes? This condition may be associated with or caused by:  Urinary tract infection.  Infection of nearby tissues, such as the prostate.  Prostate enlargement.  Surgery on the uterus or urethra.  Bladder stones, inflammation, or tumors.  Drinking too much caffeine or alcohol.  Certain medicines, especially medicines that get rid of extra fluid in the body (diuretics).  Muscle or nerve weakness, especially from: ? A spinal cord injury. ? Stroke. ? Multiple sclerosis. ? Parkinson's disease.  Diabetes.  Constipation. What increases the risk? You may be at greater risk for overactive bladder if you:  Are an older adult.  Smoke.  Are going through menopause.  Have prostate problems.  Have a neurological disease, such as stroke, dementia, Parkinson's disease, or multiple sclerosis (MS).  Eat or drink things that irritate the bladder. These  include alcohol, spicy food, and caffeine.  Are overweight or obese. What are the signs or symptoms? Symptoms of this condition include:  Sudden, strong urge to urinate.  Leaking urine.  Urinating 8 or more times a Mannie Ohlin.  Waking up to urinate 2 or more times a night. How is this diagnosed? Your health care provider may suspect overactive bladder based on your symptoms. He or she will diagnose this condition by:  A physical exam and medical history.  Blood or urine tests. You might need bladder or urine tests to help determine what is causing your overactive bladder. You might also need to see a health care provider who specializes in urinary tract problems (urologist). How is this treated? Treatment for overactive bladder depends on the cause of your condition and whether it is mild or severe. You can also make lifestyle changes at home. Options include:  Bladder training. This may include: ? Learning to control the urge to urinate by following a schedule that directs you to urinate at regular intervals (timed voiding). ? Doing Kegel exercises to strengthen your pelvic floor muscles, which support your bladder. Toning these muscles can help you control urination, even if your bladder muscles are overactive.  Special devices. This may include: ? Biofeedback, which uses sensors to help you become aware of your body's signals. ? Electrical stimulation, which uses electrodes placed inside the body (implanted) or outside the body. These electrodes send gentle pulses of electricity to strengthen the nerves or muscles that control the bladder. ? Women may use a  plastic device that fits into the vagina and supports the bladder (pessary).  Medicines. ? Antibiotics to treat bladder infection. ? Antispasmodics to stop the bladder from releasing urine at the wrong time. ? Tricyclic antidepressants to relax bladder muscles. ? Injections of botulinum toxin type A directly into the bladder tissue  to relax bladder muscles.  Lifestyle changes. This may include: ? Weight loss. Talk to your health care provider about weight loss methods that would work best for you. ? Diet changes. This may include reducing how much alcohol and caffeine you consume, or drinking fluids at different times of the Yuuki Skeens. ? Not smoking. Do not use any products that contain nicotine or tobacco, such as cigarettes and e-cigarettes. If you need help quitting, ask your health care provider.  Surgery. ? A device may be implanted to help manage the nerve signals that control urination. ? An electrode may be implanted to stimulate electrical signals in the bladder. ? A procedure may be done to change the shape of the bladder. This is done only in very severe cases. Follow these instructions at home: Lifestyle  Make any diet or lifestyle changes that are recommended by your health care provider. These may include: ? Drinking less fluid or drinking fluids at different times of the Sabian Kuba. ? Cutting down on caffeine or alcohol. ? Doing Kegel exercises. ? Losing weight if needed. ? Eating a healthy and balanced diet to prevent constipation. This may include:  Eating foods that are high in fiber, such as fresh fruits and vegetables, whole grains, and beans.  Limiting foods that are high in fat and processed sugars, such as fried and sweet foods. General instructions  Take over-the-counter and prescription medicines only as told by your health care provider.  If you were prescribed an antibiotic medicine, take it as told by your health care provider. Do not stop taking the antibiotic even if you start to feel better.  Use any implants or pessary as told by your health care provider.  If needed, wear pads to absorb urine leakage.  Keep a journal or log to track how much and when you drink and when you feel the need to urinate. This will help your health care provider monitor your condition.  Keep all follow-up visits  as told by your health care provider. This is important. Contact a health care provider if:  You have a fever.  Your symptoms do not get better with treatment.  Your pain and discomfort get worse.  You have more frequent urges to urinate. Get help right away if:  You are not able to control your bladder. Summary  Overactive bladder refers to a condition in which a person has a sudden need to pass urine.  Several conditions may lead to an overactive bladder.  Treatment for overactive bladder depends on the cause and severity of your condition.  Follow your health care provider's instructions about lifestyle changes, doing Kegel exercises, keeping a journal, and taking medicines. This information is not intended to replace advice given to you by your health care provider. Make sure you discuss any questions you have with your health care provider. Document Revised: 12/22/2018 Document Reviewed: 09/16/2017 Elsevier Patient Education  [place].

## 2020-05-15 ENCOUNTER — Other Ambulatory Visit: Payer: Self-pay

## 2020-05-15 DIAGNOSIS — F0390 Unspecified dementia without behavioral disturbance: Secondary | ICD-10-CM

## 2020-05-15 DIAGNOSIS — E785 Hyperlipidemia, unspecified: Secondary | ICD-10-CM

## 2020-05-15 DIAGNOSIS — I10 Essential (primary) hypertension: Secondary | ICD-10-CM

## 2020-05-20 ENCOUNTER — Telehealth: Payer: Self-pay | Admitting: Internal Medicine

## 2020-05-20 NOTE — Telephone Encounter (Signed)
See recommendations from our clinical pharmacist below:  1.  Okay to stop aspirin (per current guidelines for primary prevention) 2.  She has overactive bladder, I was under the impression that she is taking oxybutynin 5 mg TID, if she is compliant with that?  Which you prefer to take oxybutynin 10 mg daily? 3.  Kaylyn, please let me know  ====  -Pt saw me in office and brought all of her meds. She did not have oxybutynin among her bottles.  Would you be willing to prescribe oxybutynin xl 10mg  daily? If she tolerates this dose we can see if 15mg  can provide more relief.   -Pt had a bottle of aspirin 325mg . Informed her that aspirin 81mg  is the correct dose for primary prevention and that she could consider D/C aspirin altogether, but I wanted to consult with you.  She has a family hx of heart attack/stroke with her mother and sister, but I don't know that the benefit outweighs the risk considering her age. What are your thoughts?   I'll be happy to communicate this to the patient.   Please let me know if I can assist with anything further!   Thanks,  De Blanch, PharmD  Clinical Pharmacist  [facility] at [facility]  [phone]

## 2020-05-21 MED ORDER — OXYBUTYNIN CHLORIDE 5 MG PO TABS: 5.0000 mg | ORAL_TABLET | Freq: Three times a day (TID) | ORAL | 1 refills | Status: DC

## 2020-05-21 NOTE — Telephone Encounter (Signed)
Spoke w/ Pt- informed okay to d/c aspirin. I asked her about the oxybutynin- she informed she normally does take it but was not in her last refill of medications. She is requesting a refill to be sent to [facility] on [address] in [place].

## 2020-05-21 NOTE — Telephone Encounter (Signed)
oxybutynin 5 mg 3 times daily, okay to refill 6 months

## 2020-05-21 NOTE — Telephone Encounter (Signed)
Rx sent 

## 2020-06-21 ENCOUNTER — Telehealth: Payer: Self-pay | Admitting: Pharmacist

## 2020-06-21 NOTE — Progress Notes (Addendum)
Chronic Care Management Pharmacy Assistant   Name: [name]  MRN: [id number] DOB: Feb 08, 1936  Reason for Encounter: Disease State  Patient Questions:  1.  Have you seen any other providers since your last visit? No  2.  Any changes in your medicines or health? No  PCP : Colon Branch, MD   Their chronic conditions include: Hypertension, Hyperlipidemia, Osteopenia, Depression, Dementia, GERD, Overactive Bladder.  Office Visits: None since their last CCM visit with the clinical pharmacist.  Consults: None since their last CCM visit with the clinical pharmacist.  Allergies:  No Known Allergies  Medications: Outpatient Encounter Medications as of 06/21/2020  Medication Sig  . alendronate (FOSAMAX) 70 MG tablet TAKE 1 TABLET BY MOUTH  EVERY 7 DAYS WITH A FULL  GLASS OF WATER ON AN EMPTY  STOMACH  . Ascorbic Acid (VITAMIN C PO) Take by mouth daily.  . B Complex-C (B-COMPLEX WITH VITAMIN C) tablet Take 1 tablet by mouth daily.    . Cholecalciferol (VITAMIN D3) 1000 UNITS capsule Take 1,000 Units by mouth daily.    . Iron-Vitamins (GERITOL PO) Take by mouth daily.  . memantine (NAMENDA) 5 MG tablet Take 1 tablet (5 mg total) by mouth 2 (two) times daily.  . Multiple Vitamins-Minerals (OCUVITE PO) Take by mouth.    Marland Kitchen NIFEdipine (PROCARDIA XL/NIFEDICAL XL) 60 MG 24 hr tablet TAKE 1 TABLET BY MOUTH  DAILY  . Omega-3 Fatty Acids (FISH OIL PO) Take by mouth.    Marland Kitchen omeprazole (PRILOSEC) 20 MG capsule Take 1 capsule (20 mg total) by mouth 2 (two) times daily before a meal.  . oxybutynin (DITROPAN) 5 MG tablet Take 1 tablet (5 mg total) by mouth 3 (three) times daily.  . simvastatin (ZOCOR) 20 MG tablet TAKE 1 TABLET BY MOUTH  DAILY  . venlafaxine XR (EFFEXOR XR) 75 MG 24 hr capsule Take 2 capsules (150 mg total) by mouth daily with breakfast.  . vitamin E 600 UNIT capsule Take 600 Units by mouth daily.     No facility-administered encounter medications on file as of 06/21/2020.     Current Diagnosis: Patient Active Problem List   Diagnosis Date Noted  . Closed fracture of left distal radius 07/31/2019  . Closed nondisplaced fracture of lateral malleolus of right fibula 01/17/2019  . PCP NOTES >>>>>>>>>>>>>>>> 09/03/2015  . Personal history of colonic polyps - adenomas 04/19/2014  . OAB (overactive bladder) 07/13/2012  . Annual physical exam 12/17/2010  . Aortic stenosis 01/01/2010  . Hyperlipidemia 11/21/2008  . GERD 11/28/2007  . Depression 06/24/2007  . Essential hypertension 06/24/2007  . OSTEOARTHRITIS 06/24/2007  . Osteoporosis 06/24/2007    Goals Addressed   None    Reviewed chart prior to disease state call. Spoke with patient regarding BP  Recent Office Vitals: BP Readings from Last 3 Encounters:  05/09/20 136/70  04/30/20 132/83  02/26/20 (!) 143/87   Pulse Readings from Last 3 Encounters:  05/09/20 80  04/30/20 94  02/26/20 87    Wt Readings from Last 3 Encounters:  05/09/20 180 lb 6.4 oz (81.8 kg)  04/30/20 181 lb 8 oz (82.3 kg)  02/26/20 176 lb 8 oz (80.1 kg)     Kidney Function Lab Results  Component Value Date/Time   CREATININE 0.97 02/26/2020 02:54 PM   CREATININE 0.99 05/16/2019 10:20 AM   GFR 54.98 (L) 02/26/2020 02:54 PM   GFRNONAA 74.99 12/13/2009 12:00 AM   GFRAA 91 06/15/2008 10:08 AM    BMP Latest  Ref Rng & Units 02/26/2020 05/16/2019 09/16/2017  Glucose 70 - 99 mg/dL 123(H) 90 110(H)  BUN 6 - 23 mg/dL 37(H) 26(H) 25(H)  Creatinine 0.40 - 1.20 mg/dL 0.97 0.99 0.89  Sodium 135 - 145 mEq/L 137 144 144  Potassium 3.5 - 5.1 mEq/L 3.8 3.8 4.0  Chloride 96 - 112 mEq/L 103 104 105  CO2 19 - 32 mEq/L 27 32 29  Calcium 8.4 - 10.5 mg/dL 9.8 9.6 9.8    . Current antihypertensive regimen:  o Nifedipine 60mg  daily. Patient reports taking this medication every day.  . How often are you checking your Blood Pressure? infrequently   . Current home BP readings: Patient did not have any BP readings at the time of the call.  She moved into a new apartment not too long ago and has not located her home BP machine.   . What recent interventions/DTPs have been made by any provider to improve Blood Pressure control since last CPP Visit: none  . Any recent hospitalizations or ED visits since last visit with CPP? No   . What diet changes have been made to improve Blood Pressure Control?  o Patient states she drinks zero suagr protein shakes every morning.   . What exercise is being done to improve your Blood Pressure Control?  o Patient states she has not been exercising like she should lately.  Patient states the one year anniversary of her husbands death is coming up on the 07-14-23. She says feels like she is dealing with her loss well by staying busy and surrounded by people.   Adherence Review: Is the patient currently on ACE/ARB medication? No Does the patient have >5 day gap between last estimated fill dates? No   Follow-Up:  Pharmacist Review   Fanny Skates, [place] Pharmacist Assistant [phone]  Reviewed by: De Blanch, PharmD Clinical Pharmacist [facility] at [facility] [phone]

## 2020-07-02 ENCOUNTER — Telehealth: Payer: Self-pay | Admitting: Pharmacist

## 2020-07-02 NOTE — Progress Notes (Addendum)
    Chronic Care Management Pharmacy Assistant   Name: KATIRIA CALAME  MRN: [id number] DOB: 1936-05-07  Reason for Encounter: Adherence Data Check in  Patient Questions:  1.  Have you seen any other providers since your last visit? No  2.  Any changes in your medicines or health? No   PCP : Colon Branch, MD  Allergies:  No Known Allergies  Medications: Outpatient Encounter Medications as of 07/02/2020  Medication Sig  . alendronate (FOSAMAX) 70 MG tablet TAKE 1 TABLET BY MOUTH  EVERY 7 DAYS WITH A FULL  GLASS OF WATER ON AN EMPTY  STOMACH  . Ascorbic Acid (VITAMIN C PO) Take by mouth daily.  . B Complex-C (B-COMPLEX WITH VITAMIN C) tablet Take 1 tablet by mouth daily.    . Cholecalciferol (VITAMIN D3) 1000 UNITS capsule Take 1,000 Units by mouth daily.    . Iron-Vitamins (GERITOL PO) Take by mouth daily.  . memantine (NAMENDA) 5 MG tablet Take 1 tablet (5 mg total) by mouth 2 (two) times daily.  . Multiple Vitamins-Minerals (OCUVITE PO) Take by mouth.    Marland Kitchen NIFEdipine (PROCARDIA XL/NIFEDICAL XL) 60 MG 24 hr tablet TAKE 1 TABLET BY MOUTH  DAILY  . Omega-3 Fatty Acids (FISH OIL PO) Take by mouth.    Marland Kitchen omeprazole (PRILOSEC) 20 MG capsule Take 1 capsule (20 mg total) by mouth 2 (two) times daily before a meal.  . oxybutynin (DITROPAN) 5 MG tablet Take 1 tablet (5 mg total) by mouth 3 (three) times daily.  . simvastatin (ZOCOR) 20 MG tablet TAKE 1 TABLET BY MOUTH  DAILY  . venlafaxine XR (EFFEXOR XR) 75 MG 24 hr capsule Take 2 capsules (150 mg total) by mouth daily with breakfast.  . vitamin E 600 UNIT capsule Take 600 Units by mouth daily.     No facility-administered encounter medications on file as of 07/02/2020.    Current Diagnosis: Patient Active Problem List   Diagnosis Date Noted  . Closed fracture of left distal radius 07/31/2019  . Closed nondisplaced fracture of lateral malleolus of right fibula 01/17/2019  . PCP NOTES >>>>>>>>>>>>>>>> 09/03/2015  . Personal history of  colonic polyps - adenomas 04/19/2014  . OAB (overactive bladder) 07/13/2012  . Annual physical exam 12/17/2010  . Aortic stenosis 01/01/2010  . Hyperlipidemia 11/21/2008  . GERD 11/28/2007  . Depression 06/24/2007  . Essential hypertension 06/24/2007  . OSTEOARTHRITIS 06/24/2007  . Osteoporosis 06/24/2007    Goals Addressed   None    Reviewing Adherence Report Data. Per the data, patient is 63% adherent with her simvastatin 20 mg tab in the month of September. She receives this medication with [facility] mail service. The last fill date provided is 04-15-2020. Made outbound call to patient to verify if they were receiving medication with their mail service. Unable to make contact. Left her a message to return my call.  Patient returned my call on 07-03-2020, I was out of the office. I then made an outbound call to the patien . She states she contacted [facility] to ensure she would get her simvastatin medication on time before she runs out. [facility] informed her that her medication would be out for delivery soon for her to receive by next week. Adherence data shows her next refill due is 07-14-2020.  Follow-Up:  Pharmacist Review   Fanny Skates, [place] Pharmacist Assistant [phone]  Reviewed by: De Blanch, PharmD Clinical Pharmacist [facility] at [facility] [phone]

## 2020-07-08 ENCOUNTER — Other Ambulatory Visit: Payer: Self-pay

## 2020-07-08 MED ORDER — OXYBUTYNIN CHLORIDE 5 MG PO TABS
5.0000 mg | ORAL_TABLET | Freq: Three times a day (TID) | ORAL | 3 refills | Status: DC
Start: 2020-07-08 — End: 2020-10-21

## 2020-07-16 ENCOUNTER — Telehealth: Payer: Self-pay

## 2020-07-16 ENCOUNTER — Encounter (HOSPITAL_COMMUNITY): Payer: Self-pay

## 2020-07-16 ENCOUNTER — Emergency Department (HOSPITAL_COMMUNITY): Payer: Medicare Other

## 2020-07-16 ENCOUNTER — Emergency Department (HOSPITAL_COMMUNITY)
Admission: EM | Admit: 2020-07-16 | Discharge: 2020-07-16 | Disposition: A | Discharge status: Home / Self Care | Payer: Medicare Other | Attending: Emergency Medicine | Admitting: Emergency Medicine

## 2020-07-16 DIAGNOSIS — R519 Headache, unspecified: Secondary | ICD-10-CM | POA: Insufficient documentation

## 2020-07-16 DIAGNOSIS — R0789 Other chest pain: Secondary | ICD-10-CM | POA: Diagnosis not present

## 2020-07-16 DIAGNOSIS — R42 Dizziness and giddiness: Secondary | ICD-10-CM | POA: Insufficient documentation

## 2020-07-16 DIAGNOSIS — R079 Chest pain, unspecified: Secondary | ICD-10-CM

## 2020-07-16 DIAGNOSIS — I10 Essential (primary) hypertension: Secondary | ICD-10-CM | POA: Insufficient documentation

## 2020-07-16 DIAGNOSIS — M25511 Pain in right shoulder: Secondary | ICD-10-CM | POA: Diagnosis not present

## 2020-07-16 DIAGNOSIS — R918 Other nonspecific abnormal finding of lung field: Secondary | ICD-10-CM | POA: Diagnosis not present

## 2020-07-16 DIAGNOSIS — M19011 Primary osteoarthritis, right shoulder: Secondary | ICD-10-CM | POA: Diagnosis not present

## 2020-07-16 LAB — BASIC METABOLIC PANEL
Anion gap: 14 (ref 5–15)
BUN: 29 mg/dL — ABNORMAL HIGH (ref 8–23)
CO2: 24 mmol/L (ref 22–32)
Calcium: 9.4 mg/dL (ref 8.9–10.3)
Chloride: 103 mmol/L (ref 98–111)
Creatinine, Ser: 1.08 mg/dL — ABNORMAL HIGH (ref 0.44–1.00)
GFR, Estimated: 51 mL/min — ABNORMAL LOW (ref >60)
Glucose, Bld: 133 mg/dL — ABNORMAL HIGH (ref 70–99)
Potassium: 3.9 mmol/L (ref 3.5–5.1)
Sodium: 141 mmol/L (ref 135–145)

## 2020-07-16 LAB — CBC
HCT: 38.2 % (ref 36.0–46.0)
Hemoglobin: 12.5 g/dL (ref 12.0–15.0)
MCH: 30.8 pg (ref 26.0–34.0)
MCHC: 32.7 g/dL (ref 30.0–36.0)
MCV: 94.1 fL (ref 80.0–100.0)
Platelets: 199 10*3/uL (ref 150–400)
RBC: 4.06 MIL/uL (ref 3.87–5.11)
RDW: 12.2 % (ref 11.5–15.5)
WBC: 7.3 10*3/uL (ref 4.0–10.5)
nRBC: 0 % (ref 0.0–0.2)

## 2020-07-16 LAB — TROPONIN I (HIGH SENSITIVITY): Troponin I (High Sensitivity): 8 ng/L (ref <18)

## 2020-07-16 MED ORDER — SODIUM CHLORIDE 0.9 % IV BOLUS
1000.0000 mL | Freq: Once | INTRAVENOUS | Status: AC
  Administered 2020-07-16: 1000 mL via INTRAVENOUS

## 2020-07-16 NOTE — ED Triage Notes (Signed)
Pt presents with c/o right shoulder pain and a headache. Pt denies any injury to her head. Pt reports she has had the headache all night long but the shoulder pain has been present for a couple of months, denies any injury.

## 2020-07-16 NOTE — Discharge Instructions (Addendum)
You were evaluated in the Emergency Department and after careful evaluation, we did not find any emergent condition requiring admission or further testing in the hospital.  Your exam/testing today was overall reassuring.  Your dizziness and headache symptoms seem to be due to mild dehydration.  We recommend increasing your water intake during the day.  Your shoulder pain seems to be related to arthritis.  We recommend Tylenol every 4-6 hours as needed.  To prevent falls we recommend trying out one of the canes that she have at home.  You can also follow-up with your primary care doctor to discuss the need for a walker.  Please return to the Emergency Department if you experience any worsening of your condition.  Thank you for allowing [place] to be a part of your care.

## 2020-07-16 NOTE — ED Provider Notes (Signed)
[facility] Emergency Department Provider Note MRN:  [id number]  Arrival date & time: 07/16/20     Chief Complaint   Headache and Shoulder Pain   History of Present Illness   [name] is a 84 y.o. year-old female with a history of aortic stenosis, hypertension presenting to the ED with chief complaint of headache and shoulder pain.  Several months of right shoulder pain that radiates into the right chest, on and off during this time, worse with motion of the right shoulder.  [place] about a year ago on the shoulder but no recent trauma.  Denies shortness of breath.  Endorsing dizziness described as lightheadedness throughout the evening associated with gradual onset dull frontal headache, does not normally experience headaches.  No neck pain, no visual change, no speech change, no nausea vomiting or diarrhea, no abdominal pain, no numbness or weakness to the arms or legs.  Review of Systems  A complete 10 system review of systems was obtained and all systems are negative except as noted in the HPI and PMH.   Patient's Health History    Past Medical History:  Diagnosis Date  . Aortic stenosis    ECHO 5-20ll mild AS but no evidence of HCM or MR--no further testing suggested   . Blood transfusion without reported diagnosis 1971   after hysterectomy  . Cancer ([place]) 2019   squamous cell carcinoma- on chest  . Depression   . GERD (gastroesophageal reflux disease)   . History of gastroesophageal reflux (GERD)   . Hx of adenomatous colonic polyps   . Hx of cardiac cath 2006   neg  . Hypertension   . Mixed urge and stress incontinence   . OAB (overactive bladder) 07/13/2012  . Osteoarthritis   . Osteopenia    rx fosamax 08-2013  . Personal history of colonic polyps - adenomas 04/19/2014    Past Surgical History:  Procedure Laterality Date  . ABDOMINAL HYSTERECTOMY  1971  . APPENDECTOMY  1971   (at time of hyst?)  . BLADDER SURGERY  2003  . CATARACT  EXTRACTION, BILATERAL    . CHOLECYSTECTOMY    . OOPHORECTOMY  1971  . ROTATOR CUFF REPAIR Right   . TOE SURGERY  1995   R 2nd --- correction of claw toe     Family History  Problem Relation Age of Onset  . Coronary artery disease Father 45       had CABG  . Cancer Father        gallbladder  . Stroke Mother 78  . Ovarian cancer Sister 79  . Colon cancer Neg Hx        NEGATIVE  . Breast cancer Neg Hx        NEGATIVE    Social History   Socioeconomic History  . Marital status: Widowed    Spouse name: Not on file  . Number of children: 2  . Years of education: Not on file  . Highest education level: Not on file  Occupational History  . Occupation: stays at home     Employer: RETIRED  Tobacco Use  . Smoking status: Never Smoker  . Smokeless tobacco: Never Used  Substance and Sexual Activity  . Alcohol use: Yes    Alcohol/week: 0.0 standard drinks    Comment: wine rarely  . Drug use: No  . Sexual activity: Not Currently  Other Topics Concern  . Not on file  Social History Narrative   Lives by herself  Lost Husband 06/2019   Moved to a smaller appt 12/2019 (independent)   2 children, Mark (airforce), daughter Darnelle Bos ([place] police), she is very supportive   Social Determinants of Radio broadcast assistant Strain: Low Risk   . Difficulty of Paying Living Expenses: Not hard at all  Food Insecurity: No Food Insecurity  . Worried About Charity fundraiser in the Last Year: Never true  . Ran Out of Food in the Last Year: Never true  Transportation Needs: No Transportation Needs  . Lack of Transportation (Medical): No  . Lack of Transportation (Non-Medical): No  Physical Activity:   . Days of Exercise per Week: Not on file  . Minutes of Exercise per Session: Not on file  Stress:   . Feeling of Stress : Not on file  Social Connections:   . Frequency of Communication with Friends and Family: Not on file  . Frequency of Social Gatherings with Friends and Family:  Not on file  . Attends Religious Services: Not on file  . Active Member of Clubs or Organizations: Not on file  . Attends Archivist Meetings: Not on file  . Marital Status: Not on file  Intimate Partner Violence:   . Fear of Current or Ex-Partner: Not on file  . Emotionally Abused: Not on file  . Physically Abused: Not on file  . Sexually Abused: Not on file     Physical Exam   Vitals:   07/16/20 0956  BP: 132/87  Pulse: (!) 108  Resp: 18  Temp: 99.7 F (37.6 C)  SpO2: 95%    CONSTITUTIONAL: Well-appearing, NAD NEURO:  Alert and oriented x 3, no focal deficits EYES:  eyes equal and reactive ENT/NECK:  no LAD, no JVD CARDIO: Regular rate, well-perfused, normal S1 and S2 PULM:  CTAB no wheezing or rhonchi GI/GU:  normal bowel sounds, non-distended, non-tender MSK/SPINE:  No gross deformities, no edema SKIN:  no rash, atraumatic PSYCH:  Appropriate speech and behavior  *Additional and/or pertinent findings included in MDM below  Diagnostic and Interventional Summary    EKG Interpretation  Date/Time:  Tuesday July 16 2020 10:26:20 EDT Ventricular Rate:  90 PR Interval:    QRS Duration: 156 QT Interval:  423 QTC Calculation: 518 R Axis:   16 Text Interpretation: Sinus rhythm Left bundle branch block No significant change was found Confirmed by Gerlene Fee [phone]) on 07/16/2020 10:29:29 AM      Labs Reviewed  BASIC METABOLIC PANEL - Abnormal; Notable for the following components:      Result Value   Glucose, Bld 133 (*)    BUN 29 (*)    Creatinine, Ser 1.08 (*)    GFR, Estimated 51 (*)    All other components within normal limits  CBC  TROPONIN I (HIGH SENSITIVITY)    CT HEAD WO CONTRAST  Final Result    DG Shoulder Right  Final Result    DG Chest 2 View  Final Result      Medications  sodium chloride 0.9 % bolus 1,000 mL (0 mLs Intravenous Stopped 07/16/20 1254)     Procedures  /  Critical Care Procedures  ED Course and Medical  Decision Making  I have reviewed the triage vital signs, the nursing notes, and pertinent available records from the EMR.  Listed above are laboratory and imaging tests that I personally ordered, reviewed, and interpreted and then considered in my medical decision making (see below for details).  Chronic right shoulder  pain with radiation into the chest, seems musculoskeletal, will screen with EKG and troponin given patient is also having some lightheadedness.  Neurological exam is very reassuring, normal strength and sensation, normal coordination, normal speech, no vision changes.  Not endorsing vertigo, favoring dehydration given patient admits to not drinking very much water.  Will provide bolus fluids, evaluate with chest pain work-up, will also obtain CT head given new onset headache at patient's age though patient explains that headache is now resolved.  Anticipating discharge if work-up is normal.     Labs are overall reassuring, mild creatinine elevation.  Patient feeling better after IV fluids, no longer lightheaded, headache has not returned.  I personally ambulated the patient and she did fairly well, no return of dizziness or issues with balance.  In general I would say she is a mild fall risk, not as steady on her feet as she could be.  We discussed the importance of preventing falls, family has few canes at home that she will try to use to prevent falls and they will follow up with PCP within the next few weeks.  Barth Kirks. [place]onia Small, [place] [email or web address]  Final Clinical Impressions(s) / ED Diagnoses     ICD-10-CM   1. Acute pain of right shoulder  M25.511   2. Chest pain  R07.9 DG Chest 2 View    DG Chest 2 View  3. Acute nonintractable headache, unspecified headache type  R51.9   4. [place]     ED Discharge Orders    None       Discharge Instructions Discussed with and Provided to Patient:     Discharge  Instructions     You were evaluated in the Emergency Department and after careful evaluation, we did not find any emergent condition requiring admission or further testing in the hospital.  Your exam/testing today was overall reassuring.  Your dizziness and headache symptoms seem to be due to mild dehydration.  We recommend increasing your water intake during the day.  Your shoulder pain seems to be related to arthritis.  We recommend Tylenol every 4-6 hours as needed.  To prevent falls we recommend trying out one of the canes that she have at home.  You can also follow-up with your primary care doctor to discuss the need for a walker.  Please return to the Emergency Department if you experience any worsening of your condition.  Thank you for allowing [place] to be a part of your care.        Maudie Flakes, MD 07/16/20 1304

## 2020-07-16 NOTE — Telephone Encounter (Signed)
Nurse Assessment Nurse: Rock Nephew, RN, Juliann Pulse Date/Time (Eastern Time): 07/16/2020 8:30:49 AM Confirm and document reason for call. If symptomatic, describe symptoms. ---Caller stated she has a severe headache and dizziness since last night. She is having to hold onto the wall to walk. Does the patient have any new or worsening symptoms? ---Yes Will a triage be completed? ---Yes Related visit to physician within the last 2 weeks? ---No Does the PT have any chronic conditions? (i.e. diabetes, asthma, this includes High risk factors for pregnancy, etc.) ---Yes List chronic conditions. ---HTN Is this a behavioral health or substance abuse call? ---No Guidelines Guideline Title Affirmed Question Affirmed Notes Nurse Date/Time (Eastern Time) Headache Unable to walk, or can only walk with assistance (e.g., requires support) Rock Nephew, RN, Juliann Pulse 07/16/2020 8:32:03 AM Disp. Time Eilene Ghazi Time) Disposition Final User 07/16/2020 8:34:27 AM Go to ED Now Yes Rock Nephew, RN, Gara Kroner Disagree/Comply Comply Caller Understands Yes PLEASE NOTE: All timestamps contained within this report are represented as [place] Standard Time. CONFIDENTIALTY NOTICE: This fax transmission is intended only for the addressee. It contains information that is legally privileged, confidential or otherwise protected from use or disclosure. If you are not the intended recipient, you are strictly prohibited from reviewing, disclosing, copying using or disseminating any of this information or taking any action in reliance on or regarding this information. If you have received this fax in error, please notify us immediately by telephone so that we can arrange for its return to [place]. Phone: [phone], Toll-Free: [phone], Fax: [phone] Page: 2 of 2 Call Id: [id number] PreDisposition Call Doctor Care Advice Given Per Guideline GO TO ED NOW: * You need to be seen in the Emergency Department. * Another adult should drive. * Bring a  list of your current medicines when you go to the Emergency Department (ER). CARE ADVICE given per Headache (Adult) guideline. Referrals GO TO FACILITY UNDECIDED  Pt in ED.

## 2020-07-31 ENCOUNTER — Other Ambulatory Visit: Payer: Self-pay

## 2020-07-31 ENCOUNTER — Ambulatory Visit (INDEPENDENT_AMBULATORY_CARE_PROVIDER_SITE_OTHER): Payer: Medicare Other | Admitting: Internal Medicine

## 2020-07-31 ENCOUNTER — Encounter: Payer: Self-pay | Admitting: Internal Medicine

## 2020-07-31 VITALS — BP 121/81 | HR 97 | Temp 98.1°F | Resp 18 | Ht 67.0 in | Wt 180.2 lb

## 2020-07-31 DIAGNOSIS — F0390 Unspecified dementia without behavioral disturbance: Secondary | ICD-10-CM | POA: Diagnosis not present

## 2020-07-31 DIAGNOSIS — Z23 Encounter for immunization: Secondary | ICD-10-CM | POA: Diagnosis not present

## 2020-07-31 DIAGNOSIS — R739 Hyperglycemia, unspecified: Secondary | ICD-10-CM

## 2020-07-31 DIAGNOSIS — E785 Hyperlipidemia, unspecified: Secondary | ICD-10-CM | POA: Diagnosis not present

## 2020-07-31 DIAGNOSIS — F32A Depression, unspecified: Secondary | ICD-10-CM

## 2020-07-31 DIAGNOSIS — Z7185 Encounter for immunization safety counseling: Secondary | ICD-10-CM | POA: Diagnosis not present

## 2020-07-31 LAB — LIPID PANEL
Cholesterol: 164 mg/dL (ref 0–200)
HDL: 59.5 mg/dL (ref >39.00)
LDL Cholesterol: 80 mg/dL (ref 0–99)
NonHDL: 104.61
Total CHOL/HDL Ratio: 3
Triglycerides: 122 mg/dL (ref 0.0–149.0)
VLDL: 24.4 mg/dL (ref 0.0–40.0)

## 2020-07-31 LAB — HEMOGLOBIN A1C: Hgb A1c MFr Bld: 5.7 % (ref 4.6–6.5)

## 2020-07-31 NOTE — Patient Instructions (Addendum)
    GO TO THE LAB : Get the blood work     GO TO THE FRONT DESK, [place] Come back for a physical exam in 4 months

## 2020-07-31 NOTE — Progress Notes (Signed)
Subjective:    Patient ID: [name], female    DOB: 1935-12-12, 84 y.o.   MRN: [id number]  DOS:  07/31/2020 Type of visit - description: Follow-up Since the last office visit she is doing well. She did go to the ER complaining of shoulder pain with radiation to the chest.  Also headache. Work-up was negative.  Symptoms are essentially resolved. We review the medicines she takes, good compliance without apparent side effects. Still miss her husband but overall her mood is much improved. She also feels that her memory has improved.  Review of Systems See above   Past Medical History:  Diagnosis Date  . Aortic stenosis    ECHO 5-20ll mild AS but no evidence of HCM or MR--no further testing suggested   . Blood transfusion without reported diagnosis 1971   after hysterectomy  . Cancer ([place]) 2019   squamous cell carcinoma- on chest  . Depression   . GERD (gastroesophageal reflux disease)   . History of gastroesophageal reflux (GERD)   . Hx of adenomatous colonic polyps   . Hx of cardiac cath 2006   neg  . Hypertension   . Mixed urge and stress incontinence   . OAB (overactive bladder) 07/13/2012  . Osteoarthritis   . Osteopenia    rx fosamax 08-2013  . Personal history of colonic polyps - adenomas 04/19/2014    Past Surgical History:  Procedure Laterality Date  . ABDOMINAL HYSTERECTOMY  1971  . APPENDECTOMY  1971   (at time of hyst?)  . BLADDER SURGERY  2003  . CATARACT EXTRACTION, BILATERAL    . CHOLECYSTECTOMY    . OOPHORECTOMY  1971  . ROTATOR CUFF REPAIR Right   . TOE SURGERY  1995   R 2nd --- correction of claw toe     Allergies as of 07/31/2020   No Known Allergies     Medication List       Accurate as of July 31, 2020 11:59 PM. If you have any questions, ask your nurse or doctor.        alendronate 70 MG tablet Commonly known as: FOSAMAX TAKE 1 TABLET BY MOUTH  EVERY 7 DAYS WITH A FULL  GLASS OF WATER ON AN EMPTY  STOMACH What changed: See  the new instructions.   B-complex with vitamin C tablet Take 1 tablet by mouth daily.   Cholecalciferol 25 MCG (1000 UT) capsule Take 1,000 Units by mouth daily.   Effexor XR 75 MG 24 hr capsule Generic drug: venlafaxine XR Take 1 capsule (75 mg total) by mouth in the morning and at bedtime. What changed: Another medication with the same name was removed. Continue taking this medication, and follow the directions you see here. Changed by: [name], MD   FISH OIL PO Take by mouth.   GERITOL PO Take by mouth daily.   memantine 5 MG tablet Commonly known as: Namenda Take 1 tablet (5 mg total) by mouth 2 (two) times daily.   NIFEdipine 60 MG 24 hr tablet Commonly known as: PROCARDIA XL/NIFEDICAL XL TAKE 1 TABLET BY MOUTH  DAILY   OCUVITE PO Take by mouth.   omeprazole 20 MG capsule Commonly known as: PRILOSEC Take 1 capsule (20 mg total) by mouth 2 (two) times daily before a meal.   oxybutynin 5 MG tablet Commonly known as: DITROPAN Take 1 tablet (5 mg total) by mouth 3 (three) times daily.   simvastatin 20 MG tablet Commonly known as: ZOCOR TAKE 1 TABLET  BY MOUTH  DAILY   VITAMIN C PO Take by mouth daily.   vitamin E 600 UNIT capsule Take 600 Units by mouth daily.          Objective:   Physical Exam BP 121/81 (BP Location: Left Arm, Patient Position: Sitting, Cuff Size: Small)   Pulse 97   Temp 98.1 F (36.7 C) (Oral)   Resp 18   Ht 5\' 7"  (1.702 m)   Wt 180 lb 4 oz (81.8 kg)   SpO2 94%   BMI 28.23 kg/m  General:   Well developed, NAD, BMI noted. HEENT:  Normocephalic . Face symmetric, atraumatic Lower extremities: no pretibial edema bilaterally  Skin: Not pale. Not jaundice Neurologic:  alert & oriented X3.  Speech normal, seems to be processing information better, gait appropriate for age and unassisted Psych--  Cognition and judgment appear intact.  Cooperative with normal attention span and concentration.  Behavior appropriate. No anxious  or depressed appearing.  Seems in good spirits     Assessment      Assessment HTN Hyperlipidemia  GERD Depression Dementia: DX 02/2020: MRI brain no acute, labs negative, Rx Namenda Osteopenia, t score 2008 -1.8, 2011 -1.8, 2014 -2.1: Fosamax Rx 08-2013 t score 03-2016: -2.1. H/o vit d def  DJD Aortic stenosis ( at some point diagnosed with HOCM) ---echo 01-2010: "Mild AS but no evidence of HCM or MR. ---Echo 6-15 mild AoS. Overactive bladder- pcp started to rx meds 09-2016  Ao [place]: wnl 02-2015  PLAN: Dementia: On Namenda twice a day, states that she overall feels better, no family here to corroborate by she indeed  seems to be doing well, alert oriented x3.  Still living independently, she is managing her medicines, still driving.  Her daughter helps her with paperwork. Depression: Taking Effexor twice a day, she also feels better subjectively.  PHQ-9: 7,   improved.  Continue present care Hyperlipidemia: Last LFTs normal, on simvastatin, check FLP Hyperglycemia: Per chart review, check A1c Preventive care: Up-to-date on COVID vaccine and flu shots.  PNM 23 booster today. RTC 4 months   This visit occurred during the SARS-CoV-2 public health emergency.  Safety protocols were in place, including screening questions prior to the visit, additional usage of staff PPE, and extensive cleaning of exam room while observing appropriate contact time as indicated for disinfecting solutions.

## 2020-07-31 NOTE — Progress Notes (Signed)
Pre visit review using our clinic review tool, if applicable. No additional management support is needed unless otherwise documented below in the visit note. 

## 2020-08-01 ENCOUNTER — Telehealth: Payer: Medicare Other

## 2020-08-01 ENCOUNTER — Ambulatory Visit: Payer: Self-pay | Admitting: Pharmacist

## 2020-08-01 NOTE — Assessment & Plan Note (Signed)
Dementia: On Namenda twice a day, states that she overall feels better, no family here to corroborate by she indeed  seems to be doing well, alert oriented x3.  Still living independently, she is managing her medicines, still driving.  Her daughter helps her with paperwork. Depression: Taking Effexor twice a day, she also feels better subjectively.  PHQ-9: 7,   improved.  Continue present care Hyperlipidemia: Last LFTs normal, on simvastatin, check FLP Hyperglycemia: Per chart review, check A1c Preventive care: Up-to-date on COVID vaccine and flu shots.  PNM 23 booster today. RTC 4 months

## 2020-08-01 NOTE — Chronic Care Management (AMB) (Signed)
  Chronic Care Management   Outreach Note  08/01/2020 Name: [name] MRN: [id number] DOB: 23-Dec-1935  Referred by: Colon Branch, MD Reason for referral : No chief complaint on file.   Third unsuccessful telephone outreach was attempted today. The patient was referred to the pharmacist for assistance with care management and care coordination.   Follow Up Plan:  -Attempt reschedule before the end of the month or next month  Melvenia Beam Toini Failla, PharmD Clinical Pharmacist [facility] [phone]

## 2020-08-01 NOTE — Chronic Care Management (AMB) (Deleted)
Chronic Care Management Pharmacy  Name: [name]  MRN: [id number] DOB: 02/19/1936  Chief Complaint/ HPI  [name],  84 y.o. , female presents for their Initial CCM visit with the clinical pharmacist In office.  PCP : Colon Branch, MD  Their chronic conditions include: Hypertension, Hyperlipidemia, Osteopenia, Depression, Dementia, GERD, Overactive Bladder  Office Visits: 07/31/20: Visit w/ Dr. Larose Kells - Venlafaxine prescription updated to 63m twice daily  Consult Visit: None since last CCM visit on 05/09/20.   Medications: Outpatient Encounter Medications as of 08/01/2020  Medication Sig Note  . alendronate (FOSAMAX) 70 MG tablet TAKE 1 TABLET BY MOUTH  EVERY 7 DAYS WITH A FULL  GLASS OF WATER ON AN EMPTY  STOMACH (Patient taking differently: Take 70 mg by mouth once a week. )   . Ascorbic Acid (VITAMIN C PO) Take by mouth daily.   . B Complex-C (B-COMPLEX WITH VITAMIN C) tablet Take 1 tablet by mouth daily.     . Cholecalciferol (VITAMIN D3) 1000 UNITS capsule Take 1,000 Units by mouth daily.     . Iron-Vitamins (GERITOL PO) Take by mouth daily.   . memantine (NAMENDA) 5 MG tablet Take 1 tablet (5 mg total) by mouth 2 (two) times daily.   . Multiple Vitamins-Minerals (OCUVITE PO) Take by mouth.     .Marland KitchenNIFEdipine (PROCARDIA XL/NIFEDICAL XL) 60 MG 24 hr tablet TAKE 1 TABLET BY MOUTH  DAILY (Patient taking differently: Take 60 mg by mouth daily. )   . Omega-3 Fatty Acids (FISH OIL PO) Take by mouth.     .Marland Kitchenomeprazole (PRILOSEC) 20 MG capsule Take 1 capsule (20 mg total) by mouth 2 (two) times daily before a meal.   . oxybutynin (DITROPAN) 5 MG tablet Take 1 tablet (5 mg total) by mouth 3 (three) times daily. 07/16/2020: Has not started  . simvastatin (ZOCOR) 20 MG tablet TAKE 1 TABLET BY MOUTH  DAILY (Patient taking differently: Take 20 mg by mouth daily. )   . venlafaxine XR (EFFEXOR XR) 75 MG 24 hr capsule Take 1 capsule (75 mg total) by mouth in the morning and at bedtime.   .  vitamin E 600 UNIT capsule Take 600 Units by mouth daily.      No facility-administered encounter medications on file as of 08/01/2020.   SDOH Screenings   Alcohol Screen:   . Last Alcohol Screening Score (AUDIT): Not on file  Depression (PHQ2-9): Medium Risk  . PHQ-2 Score: 7  Financial Resource Strain: Low Risk   . Difficulty of Paying Living Expenses: Not hard at all  Food Insecurity: No Food Insecurity  . Worried About RCharity fundraiserin the Last Year: Never true  . Ran Out of Food in the Last Year: Never true  Housing: Low Risk   . Last Housing Risk Score: 0  Physical Activity:   . Days of Exercise per Week: Not on file  . Minutes of Exercise per Session: Not on file  Social Connections:   . Frequency of Communication with Friends and Family: Not on file  . Frequency of Social Gatherings with Friends and Family: Not on file  . Attends Religious Services: Not on file  . Active Member of Clubs or Organizations: Not on file  . Attends CArchivistMeetings: Not on file  . Marital Status: Not on file  Stress:   . Feeling of Stress : Not on file  Tobacco Use: Low Risk   . Smoking Tobacco Use:  Never Smoker  . Smokeless Tobacco Use: Never Used  Transportation Needs: No Transportation Needs  . Lack of Transportation (Medical): No  . Lack of Transportation (Non-Medical): No     Current Diagnosis/Assessment:  Goals Addressed   None    Social Hx:  She is an identical twin. Her twin passed away due to ovarian cancer about 10 years ago. She had a younger sister who passed away due to heart attack and an older sister who is still living locally.   She has a son [name] who is her POA and takes good care of her. Her son lives in [place], [place] and is planning to retire from the [employer] 07-10-2020. She does not have any grandchildren of her own, but states he twin did and she is "half of me" so claims those grandchildren as her own. She lives alone; no pets. She likes where she  lives now.  Her husband passed away July 11, 2019 due to dementia, Afib, and depression per patient. They would have made it to 70 years of marriage.   Hypertension   BP goal is:  <140/90  Office blood pressures are  BP Readings from Last 3 Encounters:  07/31/20 121/81  07/16/20 132/87  05/09/20 136/70   Patient checks BP at home infrequently Patient home BP readings are ranging: Unable to assess  Patient has failed these meds in the past: None noted  Patient is currently controlled on the following medications:  . Nifedipine 47m daily  We discussed BP goal  Plan -Continue current medications     Hyperlipidemia   LDL goal <100  Lipid Panel     Component Value Date/Time   CHOL 164 07/31/2020 1354   TRIG 122.0 07/31/2020 1354   HDL 59.50 07/31/2020 1354   LDLCALC 80 07/31/2020 1354   LDLDIRECT 163.4 12/13/2009 0000    Hepatic Function Latest Ref Rng & Units 02/26/2020 05/16/2019 03/16/2017  Total Protein 6.0 - 8.3 g/dL 6.9 6.8 7.2  Albumin 3.5 - 5.2 g/dL 4.7 4.4 4.3  AST 0 - 37 U/L 18 14 16   ALT 0 - 35 U/L 20 15 17   Alk Phosphatase 39 - 117 U/L 41 59 61  Total Bilirubin 0.2 - 1.2 mg/dL 0.3 0.4 0.3  Bilirubin, Direct 0.0 - 0.3 mg/dL - - -     The ASCVD Risk score (GSonora, et al., 2013) failed to calculate for the following reasons:   The 2013 ASCVD risk score is only valid for ages 424to 722  Patient has failed these meds in past: None noted  Patient is currently controlled on the following medications:  . Simvastatin 267mdaily  Diet B - Protein shake and protein bar with 30g of protein each L - none (she snacks throughout the Deshawn Witty) D - Meat and 2 veggies, but feels she doesn't eat enough veggies Snacks - chips, cookies, PB crackers (she loves M&Ms)  Exercise None  We discussed:  Fish oil not necessary noting her lipid panel and lack of mortality benefit outside of prescription strength fish oil   Update 08/01/20 LDL still at goal  Plan -Continue  current medications  Osteopenia/Osteoporosis    Last DEXA Scan: 03/16/2016  T-Score femoral neck: -2.1 (L)  T-Score lumbar spine: -1.6  10-year probability of major osteoporotic fracture: 14.8%  10-year probability of hip fracture: 4.3%  Vit D, 25-Hydroxy  Date Value Ref Range Status  01/12/2012 40 30 - 89 ng/mL Final    Comment:    This assay  accurately quantifies Vitamin D, which is the sum of the 25-Hydroxy forms of Vitamin D2 and D3.  Studies have shown that the optimum concentration of 25-Hydroxy Vitamin D is 30 ng/mL or higher.  Concentrations of Vitamin D between 20 and 29 ng/mL are considered to be insufficient and concentrations less than 20 ng/mL are considered to be deficient for Vitamin D.     Patient is a candidate for pharmacologic treatment due to T-Score -1.0 to -2.5 and 10-year risk of hip fracture > 3%  Patient has failed these meds in past: None noted  Patient is currently controlled on the following medications:  . Alendronate 30m weekly . Vitamin D 1000 units daily  Update 08/01/20  Plan -Consider repeat DEXA -Continue current medications  Depression   Dep[facility] 07/31/2020 05/02/2020 02/26/2020  Decreased Interest 1 0 0  Down, Depressed, Hopeless 0 0 3  PHQ - 2 Score 1 0 3  Altered sleeping 0 - 2  Tired, decreased energy 2 - 3  Change in appetite 2 - 3  Feeling bad or failure about yourself  0 - 1  Trouble concentrating 1 - 3  Moving slowly or fidgety/restless 1 - 1  Suicidal thoughts 0 - 0  PHQ-9 Score 7 - 16  Difficult doing work/chores Not difficult at all - Not difficult at all    Patient has failed these meds in past: None noted  Patient is currently controlled on the following medications:  . Venlafaxine XR 756m#2 daily at breakfast  Pt had an old bottle of venlafaxine 37.1m56mShe also had 2 updated bottles of venlafaxine 71m64mated 04/2020). Pt states she did not want to take the old bottle back home and get it confused  with her other medications. Will assure venlafaxine is disposed in medication discard bin downstairs by the pharmacy.  Update 08/01/20  Plan -Continue current medications   Future Plan -Perform PHQ9  Dementia    Patient has failed these meds in past: None noted  Patient is currently controlled on the following medications: . MeMarland Kitchenantine 1mg 53mce daily  Plan -Continue current medications   GERD   Patient has failed these meds in past: None noted  Patient is currently controlled on the following medications:  . Omeprazole 20mg 102me daily  Breakthrough Sx: Rarely Breakthrough Tx: None Triggers: Spicy food  Pt had an old bottle of omeprazole (dated from 07/2019). She also had 2 updated bottles of omeprazole (dated 04/2020). Pt states she did not want to take the old bottle back home and get it confused with her other medications. Will assure omeprazole is disposed in medication discard bin downstairs by the pharmacy.  Plan -Continue current medications   Future Plan -Discuss tapering down/off of PPI if possible especially noting fracture risk  Overactive Bladder    Patient has failed these meds in past: darifenacin, solifenacin, mirabegron (cost) Patient is currently uncontrolled on the following medications: . Oxybutynin 1mg th2m times daily  Pt did not have this medication with her in office today.  She notes poor control of incontinence. Has to wear a pad daily.  Asks if she can take a stronger dose of the medication. She does not report taking the original rx TID. Will consult with PCP to have oxybutynin XL 10mg se81mn. If tolerated and further relief needed can increase to 11mg.   70mte 08/01/20 What dose do you have at home?  Plan -Consult with PCP to have oxybutynin xl 10mg dail50mnt in to  mail order pharmacy for patient.   Medication Management   Patient brings in all of her prescription medications and supplements. Each medication was reviewed and  the indication of the medication was written on her prescription bottle as patient could only recall the indication of venlafaxine for depression.   Also noted she does not have oxybutynin among her medications and states she brought every medication she takes to the office today, so patient has not had this prescription. She states it has mostly likely been a month or more since she last had it.   Will coordinate with PCP to get patient a refill sent to mail order pharmacy.  Pt uses [facility] mail order  pharmacy for all medications Uses pill box? Yes (has a pill box she can fill up one month at a time)  Miscellaneous Meds We discussed if she does want to get vitamins she should buy ones that are USP verified.  Vitamin C 2086m   Discussed she can take and D/C when she finishes the bottle Aspirin 327m Discussed risk/benefit of aspirin use for primary prevention noting her age, does have family hx of ASCVD (mother and sister); will consult with PCP regarding continuation vs discontinuation. If continued informed patient she should use aspirin 8165ms 325m87meservision  No personal hx of macular degeneration, but family hx..ok with continuation Cinnamon 1000mg41mt notes husband had hx of diabetes and was told cinnamon can help. Pt takes this for prevention. Noted that cinnamon does have evidence for being an insulin sensitizer. Ok with continuation, but stated pt could D/C when she finishes the bottle Metamucil  Pt uses prn for bowels. Ok with continuation  Plan -Continue current medication management strategy  Follow up:  1 month follow up on Oxybutynin with pharmacy team 3 month phone visit  Meds to D/C from list B complex Geritol Omega 3 VitHawkins PharmD Clinical Pharmacist [phone]

## 2020-08-05 ENCOUNTER — Telehealth: Payer: Self-pay | Admitting: Pharmacist

## 2020-08-05 NOTE — Progress Notes (Addendum)
    Chronic Care Management Pharmacy Assistant   Name: [name]  MRN: [id number] DOB: 1936-08-17  Reason for Encounter: Medication Review/ Scheduled appointment with CPP  Patient Questions:  1.  Have you seen any other providers since your last visit? No  2.  Any changes in your medicines or health? No  PCP : Colon Branch, MD   Their chronic conditions include: Hypertension, Hyperlipidemia, Osteopenia, Depression, Dementia, GERD, Overactive Bladder  Allergies:  No Known Allergies  Medications: Outpatient Encounter Medications as of 08/05/2020  Medication Sig Note   alendronate (FOSAMAX) 70 MG tablet TAKE 1 TABLET BY MOUTH  EVERY 7 DAYS WITH A FULL  GLASS OF WATER ON AN EMPTY  STOMACH (Patient taking differently: Take 70 mg by mouth once a week. )    Ascorbic Acid (VITAMIN C PO) Take by mouth daily.    B Complex-C (B-COMPLEX WITH VITAMIN C) tablet Take 1 tablet by mouth daily.      Cholecalciferol (VITAMIN D3) 1000 UNITS capsule Take 1,000 Units by mouth daily.      Iron-Vitamins (GERITOL PO) Take by mouth daily.    memantine (NAMENDA) 5 MG tablet Take 1 tablet (5 mg total) by mouth 2 (two) times daily.    Multiple Vitamins-Minerals (OCUVITE PO) Take by mouth.      NIFEdipine (PROCARDIA XL/NIFEDICAL XL) 60 MG 24 hr tablet TAKE 1 TABLET BY MOUTH  DAILY (Patient taking differently: Take 60 mg by mouth daily. )    Omega-3 Fatty Acids (FISH OIL PO) Take by mouth.      omeprazole (PRILOSEC) 20 MG capsule Take 1 capsule (20 mg total) by mouth 2 (two) times daily before a meal.    oxybutynin (DITROPAN) 5 MG tablet Take 1 tablet (5 mg total) by mouth 3 (three) times daily. 07/16/2020: Has not started   simvastatin (ZOCOR) 20 MG tablet TAKE 1 TABLET BY MOUTH  DAILY (Patient taking differently: Take 20 mg by mouth daily. )    venlafaxine XR (EFFEXOR XR) 75 MG 24 hr capsule Take 1 capsule (75 mg total) by mouth in the morning and at bedtime.    vitamin E 600 UNIT capsule Take 600 Units by  mouth daily.      No facility-administered encounter medications on file as of 08/05/2020.    Current Diagnosis: Patient Active Problem List   Diagnosis Date Noted   Closed fracture of left distal radius 07/31/2019   Closed nondisplaced fracture of lateral malleolus of right fibula 01/17/2019   PCP NOTES >>>>>>>>>>>>>>>> 09/03/2015   Personal history of colonic polyps - adenomas 04/19/2014   OAB (overactive bladder) 07/13/2012   Annual physical exam 12/17/2010   Aortic stenosis 01/01/2010   Hyperlipidemia 11/21/2008   GERD 11/28/2007   Depression 06/24/2007   Essential hypertension 06/24/2007   OSTEOARTHRITIS 06/24/2007   Osteoporosis 06/24/2007    Goals Addressed   None    Called patient and discussed medications, no changes at this time.  Patient denies any side effects to any current medications. Patient denies any recent ED visits. Patient denies any problems with current pharmacy.   Patient is scheduled for 08/06/20 at 11:30.  Follow-Up:  Pharmacist Review and Scheduled Follow-Up With Clinical Pharmacist   [place] Shannon, [facility] Pharmacist Assistant [phone]  Reviewed by: De Blanch, PharmD Clinical Pharmacist [facility] [phone]

## 2020-08-06 ENCOUNTER — Ambulatory Visit: Payer: Medicare Other | Admitting: Pharmacist

## 2020-08-06 DIAGNOSIS — I10 Essential (primary) hypertension: Secondary | ICD-10-CM

## 2020-08-06 DIAGNOSIS — E785 Hyperlipidemia, unspecified: Secondary | ICD-10-CM

## 2020-08-06 DIAGNOSIS — M8580 Other specified disorders of bone density and structure, unspecified site: Secondary | ICD-10-CM

## 2020-08-06 DIAGNOSIS — K219 Gastro-esophageal reflux disease without esophagitis: Secondary | ICD-10-CM

## 2020-08-06 NOTE — Patient Instructions (Addendum)
Visit Information  Goals Addressed            This Visit's Progress   . Chronic Care Management Pharmacy Care Plan       CARE PLAN ENTRY (see longitudinal plan of care for additional care plan information)  Current Barriers:  . Chronic Disease Management support, education, and care coordination needs related to Hypertension, Hyperlipidemia, Osteopenia, Depression, Dementia, GERD, Overactive Bladder   Hypertension BP Readings from Last 3 Encounters:  07/31/20 121/81  07/16/20 132/87  05/09/20 136/70   . Pharmacist Clinical Goal(s): o Over the next 90 days, patient will work with PharmD and providers to maintain BP goal <140/90 . Current regimen:  o Nifedipine 60mg  daily . Patient self care activities - Over the next 90 days, patient will: o Maintain hypertension medication regimen.   Hyperlipidemia Lab Results  Component Value Date/Time   LDLCALC 80 07/31/2020 01:54 PM   LDLDIRECT 163.4 12/13/2009 12:00 AM   . Pharmacist Clinical Goal(s): o Over the next 90 days, patient will work with PharmD and providers to maintain LDL goal < 100 . Current regimen:  o Simvastatin 20mg  daily . Patient self care activities - Over the next 90 days, patient will: o Maintain cholesterol medication regimen.   Overactive Bladder . Pharmacist Clinical Goal(s) o Over the next 90 days, patient will work with PharmD and providers to reduce symptoms associated with overactive bladder . Current regimen:  o None (patient does not have medication at home although prescribed oxybutynin 5mg  three times daily) . Interventions: o Collaboration with provider regarding medication management (consider starting oxybutynin xl 10mg  daily) . Patient self care activities - Over the next 90 days, patient will: o Maintain overactive bladder medication regimen  Osteopenia/Osteoporosis . Pharmacist Clinical Goal(s) o Over the next 90 days, patient will work with PharmD and providers to reduce symptoms  associated with overactive bladder . Current regimen:  . Alendronate 70mg  weekly . Vitamin D 1000 units daily . Interventions: o Consider repeat DEXA Scan . Patient self care activities - Over the next 90 days, patient will: o Maintain osteoporosis medication regimen  Medication management . Pharmacist Clinical Goal(s): o Over the next 90 days, patient will work with PharmD and providers to maintain optimal medication adherence . Current pharmacy: [facility] . Interventions o Comprehensive medication review performed. o Consult with Dr. Larose Kells if patient should continue aspirin therapy o Utilize [facility] pharmacy for medication synchronization, packaging and delivery . Patient self care activities - Over the next 90 days, patient will: o Focus on medication adherence by filling and taking medications appropriately  o Take medications as prescribed o Report any questions or concerns to PharmD and/or provider(s)  Please see past updates related to this goal by clicking on the "Past Updates" button in the selected goal         The patient verbalized understanding of instructions, educational materials, and care plan provided today and declined offer to receive copy of patient instructions, educational materials, and care plan.   Telephone follow up appointment with pharmacy team member scheduled for: 11/08/2020  De Blanch, PharmD Clinical Pharmacist [facility] at [facility] [phone]

## 2020-08-06 NOTE — Chronic Care Management (AMB) (Signed)
Chronic Care Management Pharmacy  Name: [name]  MRN: [id number] DOB: [birth year]  Chief Complaint/ HPI  [name],  84 y.o. , female presents for their Follow-Up CCM visit with the clinical pharmacist via telephone.  PCP : Colon Branch, MD  Their chronic conditions include: Hypertension, Hyperlipidemia, Osteopenia, Depression, Dementia, GERD, Overactive Bladder  Office Visits: 07/31/20: Visit w/ Dr. Larose Kells - Venlafaxine prescription updated to 24m twice daily  Consult Visit: None since last CCM visit on 05/09/20.   Medications: Outpatient Encounter Medications as of 08/06/2020  Medication Sig Note  . alendronate (FOSAMAX) 70 MG tablet TAKE 1 TABLET BY MOUTH  EVERY 7 DAYS WITH A FULL  GLASS OF WATER ON AN EMPTY  STOMACH (Patient taking differently: Take 70 mg by mouth once a week. ) 08/06/2020: Monday  . Ascorbic Acid (VITAMIN C PO) Take by mouth daily.   . B Complex-C (B-COMPLEX WITH VITAMIN C) tablet Take 1 tablet by mouth daily.     . Cholecalciferol (VITAMIN D3) 1000 UNITS capsule Take 1,000 Units by mouth daily.     . Iron-Vitamins (GERITOL PO) Take by mouth daily.   . memantine (NAMENDA) 5 MG tablet Take 1 tablet (5 mg total) by mouth 2 (two) times daily.   . Multiple Vitamins-Minerals (OCUVITE PO) Take by mouth.     .Marland KitchenNIFEdipine (PROCARDIA XL/NIFEDICAL XL) 60 MG 24 hr tablet TAKE 1 TABLET BY MOUTH  DAILY (Patient taking differently: Take 60 mg by mouth daily. )   . Omega-3 Fatty Acids (FISH OIL PO) Take by mouth.     .Marland Kitchenomeprazole (PRILOSEC) 20 MG capsule Take 1 capsule (20 mg total) by mouth 2 (two) times daily before a meal.   . oxybutynin (DITROPAN) 5 MG tablet Take 1 tablet (5 mg total) by mouth 3 (three) times daily. 07/16/2020: Has not started  . simvastatin (ZOCOR) 20 MG tablet TAKE 1 TABLET BY MOUTH  DAILY (Patient taking differently: Take 20 mg by mouth daily. )   . venlafaxine XR (EFFEXOR XR) 75 MG 24 hr capsule Take 75 mg by mouth daily with breakfast.   08/06/2020: Taking #1 daily since 06/2020  . vitamin E 600 UNIT capsule Take 600 Units by mouth daily.      No facility-administered encounter medications on file as of 08/06/2020.   SDOH Screenings   Alcohol Screen:   . Last Alcohol Screening Score (AUDIT): Not on file  Depression (PHQ2-9): Medium Risk  . PHQ-2 Score: 7  Financial Resource Strain: Low Risk   . Difficulty of Paying Living Expenses: Not hard at all  Food Insecurity: No Food Insecurity  . Worried About RCharity fundraiserin the Last Year: Never true  . Ran Out of Food in the Last Year: Never true  Housing: Low Risk   . Last Housing Risk Score: 0  Physical Activity:   . Days of Exercise per Week: Not on file  . Minutes of Exercise per Session: Not on file  Social Connections: Moderately Integrated  . Frequency of Communication with Friends and Family: More than three times a week  . Frequency of Social Gatherings with Friends and Family: More than three times a week  . Attends Religious Services: More than 4 times per year  . Active Member of Clubs or Organizations: Yes  . Attends CArchivistMeetings: More than 4 times per year  . Marital Status: Widowed  Stress:   . Feeling of Stress : Not on file  Tobacco Use: Low Risk   . Smoking Tobacco Use: Never Smoker  . Smokeless Tobacco Use: Never Used  Transportation Needs: No Transportation Needs  . Lack of Transportation (Medical): No  . Lack of Transportation (Non-Medical): No     Current Diagnosis/Assessment:  Goals Addressed            This Visit's Progress   . Chronic Care Management Pharmacy Care Plan       CARE PLAN ENTRY (see longitudinal plan of care for additional care plan information)  Current Barriers:  . Chronic Disease Management support, education, and care coordination needs related to Hypertension, Hyperlipidemia, Osteopenia, Depression, Dementia, GERD, Overactive Bladder   Hypertension BP Readings from Last 3 Encounters:    07/31/20 121/81  07/16/20 132/87  05/09/20 136/70   . Pharmacist Clinical Goal(s): o Over the next 90 days, patient will work with PharmD and providers to maintain BP goal <140/90 . Current regimen:  o Nifedipine 73m daily . Patient self care activities - Over the next 90 days, patient will: o Maintain hypertension medication regimen.   Hyperlipidemia Lab Results  Component Value Date/Time   LDLCALC 80 07/31/2020 01:54 PM   LDLDIRECT 163.4 12/13/2009 12:00 AM   . Pharmacist Clinical Goal(s): o Over the next 90 days, patient will work with PharmD and providers to maintain LDL goal < 100 . Current regimen:  o Simvastatin 211mdaily . Patient self care activities - Over the next 90 days, patient will: o Maintain cholesterol medication regimen.   Overactive Bladder . Pharmacist Clinical Goal(s) o Over the next 90 days, patient will work with PharmD and providers to reduce symptoms associated with overactive bladder . Current regimen:  o None (patient does not have medication at home although prescribed oxybutynin 33m11mhree times daily) . Interventions: o Collaboration with provider regarding medication management (consider starting oxybutynin xl 98m533mily) . Patient self care activities - Over the next 90 days, patient will: o Maintain overactive bladder medication regimen  Osteopenia/Osteoporosis . Pharmacist Clinical Goal(s) o Over the next 90 days, patient will work with PharmD and providers to reduce symptoms associated with overactive bladder . Current regimen:  . Alendronate 70mg44mkly . Vitamin D 1000 units daily . Interventions: o Consider repeat DEXA Scan . Patient self care activities - Over the next 90 days, patient will: o Maintain osteoporosis medication regimen  Medication management . Pharmacist Clinical Goal(s): o Over the next 90 days, patient will work with PharmD and providers to maintain optimal medication adherence . [facility] o Comprehensive medication review performed. o Consult with Dr. Paz iLarose Kellsatient should continue aspirin therapy o Utilize [facility] for medication synchronization, packaging and delivery . Patient self care activities - Over the next 90 days, patient will: o Focus on medication adherence by filling and taking medications appropriately  o Take medications as prescribed o Report any questions or concerns to PharmD and/or provider(s)  Please see past updates related to this goal by clicking on the "Past Updates" button in the selected goal        Social Hx:  She is an identical twin. Her twin passed away due to ovarian cancer about 10 years ago. She had a younger sister who passed away due to heart attack and an older sister who is still living locally.   She has a son Anne Webb Silversmithis her POA and takes good care of her. [place] to retire from the [employer] 07/13/2020. She does not have any grandchildren of her own, but states he twin did and she is "half of me" so claims those grandchildren as her own. She lives alone; no pets. She likes where she lives now.  Her husband passed away 07/14/2019 due to dementia, Afib, and depression per patient. They would have made it to 35 years of marriage.   Hypertension   BP goal is:  <140/90  Office blood pressures are  BP Readings from Last 3 Encounters:  07/31/20 121/81  07/16/20 132/87  05/09/20 136/70   Patient checks BP at home infrequently Patient home BP readings are ranging: Unable to assess  Patient has failed these meds in the past: None noted  Patient is currently controlled on the following medications:  . Nifedipine 28m daily  We discussed BP goal   Update 08/06/20 Just moved and can't find her BP cuff.  Hasn't checked her BP since last visit  Plan -Continue current medications     Hyperlipidemia   LDL goal <100  Lipid Panel     Component Value  Date/Time   CHOL 164 07/31/2020 1354   TRIG 122.0 07/31/2020 1354   HDL 59.50 07/31/2020 1354   LDLCALC 80 07/31/2020 1354   LDLDIRECT 163.4 12/13/2009 0000    Hepatic Function Latest Ref Rng & Units 02/26/2020 05/16/2019 03/16/2017  Total Protein 6.0 - 8.3 g/dL 6.9 6.8 7.2  Albumin 3.5 - 5.2 g/dL 4.7 4.4 4.3  AST 0 - 37 U/L 18 14 16   ALT 0 - 35 U/L 20 15 17   Alk Phosphatase 39 - 117 U/L 41 59 61  Total Bilirubin 0.2 - 1.2 mg/dL 0.3 0.4 0.3  Bilirubin, Direct 0.0 - 0.3 mg/dL - - -     The ASCVD Risk score (GSaco, et al., 2013) failed to calculate for the following reasons:   The 2013 ASCVD risk score is only valid for ages 419to 747  Patient has failed these meds in past: None noted  Patient is currently controlled on the following medications:  . Simvastatin 262mdaily  Diet B - Protein shake and protein bar with 30g of protein each L - none (she snacks throughout the Cissy Galbreath) D - Meat and 2 veggies, but feels she doesn't eat enough veggies Snacks - chips, cookies, PB crackers (she loves M&Ms)  Exercise None  We discussed:  Fish oil not necessary noting her lipid panel and lack of mortality benefit outside of prescription strength fish oil   Update 08/06/20 LDL still at goal  Plan -Continue current medications  Osteopenia   Last DEXA Scan: 03/16/2016  T-Score femoral neck: -2.1 (L)  T-Score lumbar spine: -1.6  10-year probability of major osteoporotic fracture: 14.8%  10-year probability of hip fracture: 4.3%  Vit D, 25-Hydroxy  Date Value Ref Range Status  01/12/2012 40 30 - 89 ng/mL Final    Comment:    This assay accurately quantifies Vitamin D, which is the sum of the 25-Hydroxy forms of Vitamin D2 and D3.  Studies have shown that the optimum concentration of 25-Hydroxy Vitamin D is 30 ng/mL or higher.  Concentrations of Vitamin D between 20 and 29 ng/mL are considered to be insufficient and concentrations less than 20 ng/mL are considered to be deficient for  Vitamin D.     Patient is a candidate for pharmacologic treatment due to T-Score -1.0 to -2.5 and 10-year risk of hip fracture > 3%  Patient has  failed these meds in past: None noted  Patient is currently controlled on the following medications:  . Alendronate 4m weekly . Vitamin D 1000 units daily  Update 08/06/20 Scheduled for DEXA? No, needs to get scheduled  Plan -Consider repeat DEXA -Continue current medications  Depression   [facility] 07/31/2020 05/02/2020 02/26/2020  Decreased Interest 1 0 0  Down, Depressed, Hopeless 0 0 3  PHQ - 2 Score 1 0 3  Altered sleeping 0 - 2  Tired, decreased energy 2 - 3  Change in appetite 2 - 3  Feeling bad or failure about yourself  0 - 1  Trouble concentrating 1 - 3  Moving slowly or fidgety/restless 1 - 1  Suicidal thoughts 0 - 0  PHQ-9 Score 7 - 16  Difficult doing work/chores Not difficult at all - Not difficult at all    Patient has failed these meds in past: None noted  Patient is currently controlled on the following medications:  . Venlafaxine XR 76mdaily at breakfast  Pt had an old bottle of venlafaxine 37.3m68mShe also had 2 updated bottles of venlafaxine 73m66mated 04/2020). Pt states she did not want to take the old bottle back home and get it confused with her other medications. Will assure venlafaxine is disposed in medication discard bin downstairs by the pharmacy.  Update 08/06/20 PHQ9 performing on 07/31/20. Taking Venlafaxine BID? No, States she only taking #1 pill. Has since 06/2020. She thinks she is doing fine with current dose  Plan -Continue current medications     GERD   Patient has failed these meds in past: None noted  Patient is currently controlled on the following medications:  . Omeprazole 20mg60mce daily  Breakthrough Sx: Rarely Breakthrough Tx: None Triggers: Spicy food  Pt had an old bottle of omeprazole (dated from 07/2019). She also had 2 updated bottles of  omeprazole (dated 04/2020). Pt states she did not want to take the old bottle back home and get it confused with her other medications. Will assure omeprazole is disposed in medication discard bin downstairs by the pharmacy.  Update 08/06/20 We discussed the risk/benefit of continuation vs discontinuation of long term PPI use especially noting her fracture risk. Consider decreasing omeprazole to once daily pending Dr. Paz aLarose Kellsoval.   Plan -Continue current medications   Overactive Bladder    Patient has failed these meds in past: darifenacin, solifenacin, mirabegron (cost) Patient is currently uncontrolled on the following medications: . Oxybutynin 3mg t44me times daily  Pt did not have this medication with her in office today.  She notes poor control of incontinence. Has to wear a pad daily.  Asks if she can take a stronger dose of the medication. She does not report taking the original rx TID. Will consult with PCP to have oxybutynin XL 10mg s58min. If tolerated and further relief needed can increase to 13mg.  51mate 08/06/20 What dose do you have at home? Yes, 3mg TID.22mmetimes she feels it works other times she isn't sure  Drank a whole bottle of gatorade ~20 oz and she had to urinate all Garron Eline.  Plan  -Continue current medications.  Future Plan -Consult with PCP to have oxybutynin xl 10mg dail13mnt in to mail order pharmacy for patient.   Medication Management   [facility] for all medications Uses pill box? Yes (has a pill box she can fill up one month at a time)  Miscellaneous Meds  We discussed if she does want to get vitamins she should buy ones that are USP verified.  Vitamin C 2066m   Discussed she can take and D/C when she finishes the bottle Aspirin 3251m Discussed risk/benefit of aspirin use for primary prevention noting her age, does have family hx of ASCVD (mother and sister); will consult with PCP regarding continuation vs  discontinuation. If continued informed patient she should use aspirin 8132ms 325m49meservision  No personal hx of macular degeneration, but family hx..ok with continuation Cinnamon 1000mg19mt notes husband had hx of diabetes and was told cinnamon can help. Pt takes this for prevention. Noted that cinnamon does have evidence for being an insulin sensitizer. Ok with continuation, but stated pt could D/C when she finishes the bottle Metamucil  Pt uses prn for bowels. Ok wiSadler continuation  Plan -Continue current medication management strategy  Follow up:  2 week check in on possible PPI decrease to once daily 3 month phone visit  Meds to D/C from list B complex Geritol Omega 3 VitIsle of Wight PharmD Clinical Pharmacist [phone]

## 2020-08-11 ENCOUNTER — Encounter (HOSPITAL_BASED_OUTPATIENT_CLINIC_OR_DEPARTMENT_OTHER): Payer: Self-pay | Admitting: Emergency Medicine

## 2020-08-11 ENCOUNTER — Other Ambulatory Visit: Payer: Self-pay

## 2020-08-11 ENCOUNTER — Emergency Department (HOSPITAL_BASED_OUTPATIENT_CLINIC_OR_DEPARTMENT_OTHER): Payer: Medicare Other

## 2020-08-11 ENCOUNTER — Emergency Department (HOSPITAL_BASED_OUTPATIENT_CLINIC_OR_DEPARTMENT_OTHER)
Admission: EM | Admit: 2020-08-11 | Discharge: 2020-08-11 | Disposition: A | Discharge status: Home / Self Care | Payer: Medicare Other | Attending: Emergency Medicine | Admitting: Emergency Medicine

## 2020-08-11 DIAGNOSIS — W101XXA Fall (on)(from) sidewalk curb, initial encounter: Secondary | ICD-10-CM | POA: Diagnosis not present

## 2020-08-11 DIAGNOSIS — S8000XA Contusion of unspecified knee, initial encounter: Secondary | ICD-10-CM

## 2020-08-11 DIAGNOSIS — M25561 Pain in right knee: Secondary | ICD-10-CM | POA: Diagnosis not present

## 2020-08-11 DIAGNOSIS — R220 Localized swelling, mass and lump, head: Secondary | ICD-10-CM | POA: Diagnosis not present

## 2020-08-11 DIAGNOSIS — Y92512 Supermarket, store or market as the place of occurrence of the external cause: Secondary | ICD-10-CM | POA: Insufficient documentation

## 2020-08-11 DIAGNOSIS — S8002XA Contusion of left knee, initial encounter: Secondary | ICD-10-CM | POA: Insufficient documentation

## 2020-08-11 DIAGNOSIS — S0083XA Contusion of other part of head, initial encounter: Secondary | ICD-10-CM | POA: Diagnosis not present

## 2020-08-11 DIAGNOSIS — Z79899 Other long term (current) drug therapy: Secondary | ICD-10-CM | POA: Diagnosis not present

## 2020-08-11 DIAGNOSIS — W19XXXA Unspecified fall, initial encounter: Secondary | ICD-10-CM

## 2020-08-11 DIAGNOSIS — I10 Essential (primary) hypertension: Secondary | ICD-10-CM | POA: Insufficient documentation

## 2020-08-11 DIAGNOSIS — S51811A Laceration without foreign body of right forearm, initial encounter: Secondary | ICD-10-CM | POA: Diagnosis not present

## 2020-08-11 DIAGNOSIS — S8001XA Contusion of right knee, initial encounter: Secondary | ICD-10-CM | POA: Insufficient documentation

## 2020-08-11 DIAGNOSIS — Z85828 Personal history of other malignant neoplasm of skin: Secondary | ICD-10-CM | POA: Diagnosis not present

## 2020-08-11 DIAGNOSIS — M79631 Pain in right forearm: Secondary | ICD-10-CM | POA: Diagnosis not present

## 2020-08-11 DIAGNOSIS — M7989 Other specified soft tissue disorders: Secondary | ICD-10-CM | POA: Diagnosis not present

## 2020-08-11 DIAGNOSIS — S59912A Unspecified injury of left forearm, initial encounter: Secondary | ICD-10-CM | POA: Diagnosis present

## 2020-08-11 DIAGNOSIS — S51819A Laceration without foreign body of unspecified forearm, initial encounter: Secondary | ICD-10-CM

## 2020-08-11 NOTE — ED Provider Notes (Signed)
[facility] Provider Note   CSN: [id number] Arrival date & time: 08/11/20  1437     History Chief Complaint  Patient presents with  . Fall    [name] is a 84 y.o. female.  HPI Patient presents after a fall.  States she was stepping over a curb to get to the store and her foot slipped and she fell.  Landed on her right side.  Pain in both knees right forearm right shoulder and hit face.  No loss conscious.  Patient states she is on a blood thinner but reviewing records did not see that she is actually on one.  States medicines would come through Dr. Larose Kells.  No neck pain.  No chest pain.  No abdominal pain.    Past Medical History:  Diagnosis Date  . Aortic stenosis    ECHO 5-20ll mild AS but no evidence of HCM or MR--no further testing suggested   . Blood transfusion without reported diagnosis 1971   after hysterectomy  . Cancer ([place]) 2019   squamous cell carcinoma- on chest  . Depression   . GERD (gastroesophageal reflux disease)   . History of gastroesophageal reflux (GERD)   . Hx of adenomatous colonic polyps   . Hx of cardiac cath 2006   neg  . Hypertension   . Mixed urge and stress incontinence   . OAB (overactive bladder) 07/13/2012  . Osteoarthritis   . Osteopenia    rx fosamax 08-2013  . Personal history of colonic polyps - adenomas 04/19/2014    Patient Active Problem List   Diagnosis Date Noted  . Closed fracture of left distal radius 07/31/2019  . Closed nondisplaced fracture of lateral malleolus of right fibula 01/17/2019  . PCP NOTES >>>>>>>>>>>>>>>> 09/03/2015  . Personal history of colonic polyps - adenomas 04/19/2014  . OAB (overactive bladder) 07/13/2012  . Annual physical exam 12/17/2010  . Aortic stenosis 01/01/2010  . Hyperlipidemia 11/21/2008  . GERD 11/28/2007  . Depression 06/24/2007  . Essential hypertension 06/24/2007  . OSTEOARTHRITIS 06/24/2007  . Osteoporosis 06/24/2007    Past Surgical History:    Procedure Laterality Date  . ABDOMINAL HYSTERECTOMY  1971  . APPENDECTOMY  1971   (at time of hyst?)  . BLADDER SURGERY  2003  . CATARACT EXTRACTION, BILATERAL    . CHOLECYSTECTOMY    . OOPHORECTOMY  1971  . ROTATOR CUFF REPAIR Right   . TOE SURGERY  1995   R 2nd --- correction of claw toe      OB History   No obstetric history on file.     Family History  Problem Relation Age of Onset  . Coronary artery disease Father 20       had CABG  . Cancer Father        gallbladder  . Stroke Mother 39  . Ovarian cancer Sister 8  . Colon cancer Neg Hx        NEGATIVE  . Breast cancer Neg Hx        NEGATIVE    Social History   Tobacco Use  . Smoking status: Never Smoker  . Smokeless tobacco: Never Used  Vaping Use  . Vaping Use: Never used  Substance Use Topics  . Alcohol use: Yes    Alcohol/week: 0.0 standard drinks    Comment: wine rarely  . Drug use: No    Home Medications Prior to Admission medications   Medication Sig Start Date End Date Taking? Authorizing Provider  alendronate (FOSAMAX) 70 MG tablet TAKE 1 TABLET BY MOUTH  EVERY 7 DAYS WITH A FULL  GLASS OF WATER ON AN EMPTY  STOMACH Patient taking differently: Take 70 mg by mouth once a week.  04/05/20   Colon Branch, MD  Ascorbic Acid (VITAMIN C PO) Take by mouth daily.    [provider]  B Complex-C (B-COMPLEX WITH VITAMIN C) tablet Take 1 tablet by mouth daily.      [provider]  Cholecalciferol (VITAMIN D3) 1000 UNITS capsule Take 1,000 Units by mouth daily.      [provider]  Iron-Vitamins (GERITOL PO) Take by mouth daily.    [provider]  memantine (NAMENDA) 5 MG tablet Take 1 tablet (5 mg total) by mouth 2 (two) times daily. 04/30/20   Colon Branch, MD  Multiple Vitamins-Minerals (OCUVITE PO) Take by mouth.      [provider]  NIFEdipine (PROCARDIA XL/NIFEDICAL XL) 60 MG 24 hr tablet TAKE 1 TABLET BY MOUTH  DAILY Patient taking differently: Take 60 mg  by mouth daily.  12/23/19   Colon Branch, MD  Omega-3 Fatty Acids (FISH OIL PO) Take by mouth.      [provider]  omeprazole (PRILOSEC) 20 MG capsule Take 1 capsule (20 mg total) by mouth 2 (two) times daily before a meal. 07/14/19   Colon Branch, MD  oxybutynin (DITROPAN) 5 MG tablet Take 1 tablet (5 mg total) by mouth 3 (three) times daily. 07/08/20   Colon Branch, MD  simvastatin (ZOCOR) 20 MG tablet TAKE 1 TABLET BY MOUTH  DAILY Patient taking differently: Take 20 mg by mouth daily.  12/23/19   Colon Branch, MD  venlafaxine XR (EFFEXOR XR) 75 MG 24 hr capsule Take 75 mg by mouth daily with breakfast.  07/31/20   Colon Branch, MD  vitamin E 600 UNIT capsule Take 600 Units by mouth daily.      [provider]    Allergies    Patient has no known allergies.  Review of Systems   Review of Systems  Constitutional: Negative for appetite change and fever.  HENT: Negative for congestion and voice change.   Eyes: Negative for visual disturbance.  Respiratory: Negative for shortness of breath.   Cardiovascular: Negative for chest pain.  Gastrointestinal: Negative for abdominal pain.  Genitourinary: Negative for flank pain.  Musculoskeletal: Negative for back pain.       Bilateral knee right forearm and right shoulder pain.  Skin: Positive for wound.  Neurological: Negative for weakness.  Hematological: Bruises/bleeds easily.  Psychiatric/Behavioral: Negative for confusion.    Physical Exam Updated Vital Signs BP (!) 155/84 (BP Location: Right Arm)   Pulse 92   Temp 98.3 F (36.8 C) (Oral)   Resp 16   Ht 5\' 7"  (1.702 m)   Wt 81.8 kg   SpO2 95%   BMI 28.23 kg/m   Physical Exam Vitals reviewed.  Constitutional:      Appearance: Normal appearance.  HENT:     Head:     Comments: Hematoma to right medial periorbital area.  Also some swelling over bridge of nose.  Eye movements intact.  Face stable.  No proptosis.    Mouth/Throat:     Mouth: Mucous membranes are  moist.  Eyes:     Extraocular Movements: Extraocular movements intact.  Neck:     Comments: No midline tenderness and painless range of motion. Cardiovascular:     Rate  and Rhythm: Regular rhythm.  Pulmonary:     Breath sounds: No wheezing or rhonchi.  Chest:     Chest wall: No tenderness.  Abdominal:     Tenderness: There is no abdominal tenderness.  Musculoskeletal:        General: Tenderness present.     Cervical back: Neck supple.     Comments: Good range of motion right shoulder.  No deformity.  No crepitance.  No tenderness over elbow.  There is laceration of right forearm with skin tear.  Approximately 5 cm and J-shaped. Tenderness to bilateral knees with some ecchymosis.  Good range of motion.  No tenderness over ankle.  No tenderness over hips.  Good range of motion in hips.  Skin:    Capillary Refill: Capillary refill takes less than 2 seconds.  Neurological:     Mental Status: She is alert and oriented to person, place, and time.  Psychiatric:        Mood and Affect: Mood normal.     ED Results / Procedures / Treatments   Labs (all labs ordered are listed, but only abnormal results are displayed) Labs Reviewed - No data to display  EKG None  Radiology DG Forearm Right  Result Date: 08/11/2020 CLINICAL DATA:  Fall with arm pain. EXAM: RIGHT FOREARM - 2 VIEW COMPARISON:  None. FINDINGS: There is no evidence of fracture or other focal bone lesions. Soft tissues are unremarkable. No radiopaque foreign body is identified. IMPRESSION: No acute osseous injury. Electronically Signed   By: Zerita Boers M.D.   On: 08/11/2020 16:33   CT Head Wo Contrast  Result Date: 08/11/2020 CLINICAL DATA:  Status post fall. EXAM: CT HEAD WITHOUT CONTRAST TECHNIQUE: Contiguous axial images were obtained from the base of the skull through the vertex without intravenous contrast. COMPARISON:  July 16, 2020 FINDINGS: Brain: There is mild cerebral atrophy with widening of the  extra-axial spaces and ventricular dilatation. There are areas of decreased attenuation within the white matter tracts of the supratentorial brain, consistent with microvascular disease changes. Vascular: No hyperdense vessel or unexpected calcification. Skull: Normal. Negative for fracture or focal lesion. Sinuses/Orbits: There is moderate severity sphenoid sinus and left ethmoid sinus mucosal thickening. Other: Moderate severity right frontal and right paranasal soft tissue swelling is seen. IMPRESSION: 1. Moderate severity right frontal and right paranasal soft tissue swelling without evidence of an acute fracture or acute intracranial abnormality. 2. Moderate severity sphenoid sinus and left ethmoid sinus disease. Electronically Signed   By: Virgina Norfolk M.D.   On: 08/11/2020 15:59   DG Knee Complete 4 Views Left  Result Date: 08/11/2020 CLINICAL DATA:  Status post fall. EXAM: LEFT KNEE - COMPLETE 4+ VIEW COMPARISON:  None. FINDINGS: No evidence of fracture, dislocation, or joint effusion. Moderate 3 compartment osteoarthritic changes of the left knee. Prepatellar soft tissue swelling. IMPRESSION: 1. No acute fracture or dislocation identified about the left knee. 2. Prepatellar soft tissue swelling. 3. Moderate 3 compartment osteoarthritic changes of the left knee. Electronically Signed   By: Fidela Salisbury M.D.   On: 08/11/2020 16:32   DG Knee Complete 4 Views Right  Result Date: 08/11/2020 CLINICAL DATA:  Fall with pain. EXAM: RIGHT KNEE - COMPLETE 4+ VIEW COMPARISON:  Right knee radiographs dated 03/19/2009. FINDINGS: No evidence of fracture, dislocation, or joint effusion. There is moderate tricompartmental osteoarthritis. Soft tissues are unremarkable. No radiopaque foreign body is identified. IMPRESSION: No acute osseous injury. Electronically Signed   By: Harley Hallmark.D.  On: 08/11/2020 16:33   CT Maxillofacial Wo Contrast  Result Date: 08/11/2020 CLINICAL DATA:  Status post  fall. EXAM: CT MAXILLOFACIAL WITHOUT CONTRAST TECHNIQUE: Multidetector CT imaging of the maxillofacial structures was performed. Multiplanar CT image reconstructions were also generated. COMPARISON:  None. FINDINGS: Osseous: No fracture or mandibular dislocation. No destructive process. Orbits: Negative. No traumatic or inflammatory finding. Sinuses: There is moderate severity sphenoid sinus and left ethmoid sinus mucosal thickening. Soft tissues: There is moderate severity right paranasal and right frontal scalp soft tissue swelling. Limited intracranial: No significant or unexpected finding. IMPRESSION: 1. No acute osseous abnormality. 2. Moderate severity right paranasal and right frontal scalp soft tissue swelling. 3. Moderate severity sphenoid sinus and left ethmoid sinus disease. Electronically Signed   By: Virgina Norfolk M.D.   On: 08/11/2020 16:01    Procedures .Marland KitchenLaceration Repair  Date/Time: 08/11/2020 4:52 PM Performed by: Davonna Belling, MD Authorized by: Davonna Belling, MD   Consent:    Consent obtained:  Verbal   Consent given by:  Patient   Risks discussed:  Infection, pain, poor cosmetic result, poor wound healing, retained foreign body and need for additional repair   Alternatives discussed:  Delayed treatment Anesthesia (see MAR for exact dosages):    Anesthesia method:  None Laceration details:    Location:  Shoulder/arm   Shoulder/arm location:  R lower arm   Length (cm):  5 Repair type:    Repair type:  Simple Pre-procedure details:    Preparation:  Imaging obtained to evaluate for foreign bodies Exploration:    Wound exploration: wound explored through full range of motion     Wound extent: no foreign bodies/material noted, no muscle damage noted and no nerve damage noted     Contaminated: no   Treatment:    Area cleansed with:  Saline   Amount of cleaning:  Standard Skin repair:    Repair method:  Steri-Strips and tissue adhesive   Number of  Steri-Strips: Multiple Steri-Strips of both eighth inch and quarter inch. Approximation:    Approximation:  Close Post-procedure details:    Dressing:  Sterile dressing   Patient tolerance of procedure:  Tolerated well, no immediate complications   (including critical care time)  Medications Ordered in ED Medications - No data to display  ED Course  I have reviewed the triage vital signs and the nursing notes.  Pertinent labs & imaging results that were available during my care of the patient were reviewed by me and considered in my medical decision making (see chart for details).    MDM Rules/Calculators/A&P                          Patient with fall.  Skin tear to forearm.  X-rays of forearm, knees Reassuring.  Does not appear to need other imaging at this time particular shoulder.  Head CT and face CT done due to swelling.  No fracture seen there.  Not on anticoagulation.  Discharge home.  Skin tear on forearm closed with Dermabond and Steri-Strips. Final Clinical Impression(s) / ED Diagnoses Final diagnoses:  Fall, initial encounter  Contusion of face, initial encounter  Skin tear of forearm without complication, initial encounter  Contusion of knee, unspecified laterality, initial encounter    Rx / DC Orders ED Discharge Orders    None       Davonna Belling, MD 08/11/20 1653

## 2020-08-11 NOTE — ED Notes (Signed)
Pt ambulatory to bathroom with 1 assist.

## 2020-08-11 NOTE — ED Triage Notes (Signed)
Pt c/o trip and fall and shopping [place] causing skin tear to right forearm, right shoulder pain and abrasion on head (patient takes blood thinner) denies loss of consciousness.

## 2020-08-27 ENCOUNTER — Other Ambulatory Visit: Payer: Self-pay

## 2020-08-27 ENCOUNTER — Encounter: Payer: Self-pay | Admitting: Internal Medicine

## 2020-08-27 ENCOUNTER — Ambulatory Visit (INDEPENDENT_AMBULATORY_CARE_PROVIDER_SITE_OTHER): Payer: Medicare Other | Admitting: Internal Medicine

## 2020-08-27 VITALS — BP 137/75 | HR 66 | Temp 98.1°F | Ht 67.0 in | Wt 182.2 lb

## 2020-08-27 DIAGNOSIS — S41111S Laceration without foreign body of right upper arm, sequela: Secondary | ICD-10-CM

## 2020-08-27 DIAGNOSIS — I10 Essential (primary) hypertension: Secondary | ICD-10-CM | POA: Diagnosis not present

## 2020-08-27 DIAGNOSIS — W19XXXD Unspecified fall, subsequent encounter: Secondary | ICD-10-CM | POA: Diagnosis not present

## 2020-08-27 DIAGNOSIS — R040 Epistaxis: Secondary | ICD-10-CM | POA: Diagnosis not present

## 2020-08-27 NOTE — Progress Notes (Signed)
Subjective:    Patient ID: [name], female    DOB: 1936/04/07, 84 y.o.   MRN: [id number]  DOS:  08/27/2020 Type of visit - description: Acute, here with her daughter  Martin Majestic to the ER 08/11/2020 after a mechanical fall: -Had a skin tear -Facial trauma CT maxillofacial: Moderate severity para nasal and right frontal scalp soft tissue swelling. Moderate severity sphenoid sinus and left ethmoid sinus disease. CT head: No acute except for soft tissue swelling. X-rays right forearm knees: No acute other some swelling  Since the ER visit, she is getting better. Facial pain and swelling decreased. Right arm skin tear seems to be healing.  Yesterday, suddenly developed left-sided nosebleed, red fresh blood noted, 911 was called, they applied nasal decongestants and bleeding stopped within an hour.  Has not returned. Previously did not have any URI type of symptoms, sinus pain congestion or nasal discharge.  BP Readings from Last 3 Encounters:  08/27/20 137/75  08/11/20 (!) 143/78  07/31/20 121/81    Review of Systems See above   Past Medical History:  Diagnosis Date  . Aortic stenosis    ECHO 5-20ll mild AS but no evidence of HCM or MR--no further testing suggested   . Blood transfusion without reported diagnosis 1971   after hysterectomy  . Cancer ([place]) 2019   squamous cell carcinoma- on chest  . Depression   . GERD (gastroesophageal reflux disease)   . History of gastroesophageal reflux (GERD)   . Hx of adenomatous colonic polyps   . Hx of cardiac cath 2006   neg  . Hypertension   . Mixed urge and stress incontinence   . OAB (overactive bladder) 07/13/2012  . Osteoarthritis   . Osteopenia    rx fosamax 08-2013  . Personal history of colonic polyps - adenomas 04/19/2014    Past Surgical History:  Procedure Laterality Date  . ABDOMINAL HYSTERECTOMY  1971  . APPENDECTOMY  1971   (at time of hyst?)  . BLADDER SURGERY  2003  . CATARACT EXTRACTION, BILATERAL    .  CHOLECYSTECTOMY    . OOPHORECTOMY  1971  . ROTATOR CUFF REPAIR Right   . TOE SURGERY  1995   R 2nd --- correction of claw toe     Allergies as of 08/27/2020   No Known Allergies     Medication List       Accurate as of August 27, 2020  5:21 PM. If you have any questions, ask your nurse or doctor.        alendronate 70 MG tablet Commonly known as: FOSAMAX TAKE 1 TABLET BY MOUTH  EVERY 7 DAYS WITH A FULL  GLASS OF WATER ON AN EMPTY  STOMACH What changed: See the new instructions.   B-complex with vitamin C tablet Take 1 tablet by mouth daily.   Cholecalciferol 25 MCG (1000 UT) capsule Take 1,000 Units by mouth daily.   FISH OIL PO Take by mouth.   GERITOL PO Take by mouth daily.   memantine 5 MG tablet Commonly known as: Namenda Take 1 tablet (5 mg total) by mouth 2 (two) times daily.   NIFEdipine 60 MG 24 hr tablet Commonly known as: PROCARDIA XL/NIFEDICAL XL TAKE 1 TABLET BY MOUTH  DAILY   OCUVITE PO Take by mouth.   omeprazole 20 MG capsule Commonly known as: PRILOSEC Take 1 capsule (20 mg total) by mouth 2 (two) times daily before a meal.   oxybutynin 5 MG tablet Commonly known as: DITROPAN  Take 1 tablet (5 mg total) by mouth 3 (three) times daily.   simvastatin 20 MG tablet Commonly known as: ZOCOR TAKE 1 TABLET BY MOUTH  DAILY   venlafaxine XR 75 MG 24 hr capsule Commonly known as: EFFEXOR-XR Take 75 mg by mouth daily with breakfast.   VITAMIN C PO Take by mouth daily.   vitamin E 600 UNIT capsule Take 600 Units by mouth daily.          Objective:   Physical Exam BP 137/75 (BP Location: Right Arm, Patient Position: Sitting, Cuff Size: Large)   Pulse 66   Temp 98.1 F (36.7 C) (Oral)   Ht 5\' 7"  (1.702 m)   Wt 182 lb 3.2 oz (82.6 kg)   SpO2 97%   BMI 28.54 kg/m  General:   Well developed, NAD, BMI noted. HEENT:  Normocephalic . Face symmetric, ecchymosis noted under the eyes bilaterally. Slightly TTP at the right  forehead. Nose: Nostrils normal, some dried blood at the external aspect of the left nostril. Lower extremities: no pretibial edema bilaterally  Skin: Skin tear at the right arm seems healed, Steri-Strips removed without any problem. Neurologic:  alert & oriented X3.  Speech normal, gait appropriate for age and unassisted Psych--  Cognition and judgment appear intact.  Cooperative with normal attention span and concentration.  Behavior appropriate. No anxious or depressed appearing.      Assessment    Assessment HTN Hyperlipidemia  GERD Depression Dementia: DX 02/2020: MRI brain no acute, labs negative, Rx Namenda Osteopenia, t score 2008 -1.8, 2011 -1.8, 2014 -2.1: Fosamax Rx 08-2013 t score 03-2016: -2.1. H/o vit d def  DJD Aortic stenosis ( at some point diagnosed with HOCM) ---echo 01-2010: "Mild AS but no evidence of HCM or MR. ---Echo 6-15 mild AoS. Overactive bladder- pcp started to rx meds 09-2016  Ao [place]: wnl 02-2015  PLAN: Mechanical fall: Was eval at the ER, work-up reviewed, fortunately she seems to be recovering.  X-rays CTs showed no fracture. Nosebleed: A one-time event, if it happens again, apply oxymetazoline -which she has- along with pressure.  If this become a serious, recurrent issue will refer to ENT. HTN: BP elevated upon arrival 160/90, no ambulatory BPs.  BP recheck 137/87 Skin tear: Wound care with soap and water, keep it clean and dry Next RTC is scheduled for 10/22/2020.   Time spent with the patient and 30 minutes, ER records reviewed, assessed and new problem nosebleed, also sometimes been talking about the skin tear and removing the Steri-Strips. This visit occurred during the SARS-CoV-2 public health emergency.  Safety protocols were in place, including screening questions prior to the visit, additional usage of staff PPE, and extensive cleaning of exam room while observing appropriate contact time as indicated for disinfecting solutions.

## 2020-08-27 NOTE — Assessment & Plan Note (Signed)
Mechanical fall: Was eval at the ER, work-up reviewed, fortunately she seems to be recovering.  X-rays CTs showed no fracture. Nosebleed: A one-time event, if it happens again, apply oxymetazoline -which she has- along with pressure.  If this become a serious, recurrent issue will refer to ENT. HTN: BP elevated upon arrival 160/90, no ambulatory BPs.  BP recheck 137/87 Skin tear: Wound care with soap and water, keep it clean and dry Next RTC is scheduled for 10/22/2020.

## 2020-08-27 NOTE — Patient Instructions (Signed)

## 2020-10-21 ENCOUNTER — Other Ambulatory Visit: Payer: Self-pay | Admitting: Internal Medicine

## 2020-10-22 ENCOUNTER — Other Ambulatory Visit (HOSPITAL_BASED_OUTPATIENT_CLINIC_OR_DEPARTMENT_OTHER): Payer: Self-pay | Admitting: Internal Medicine

## 2020-10-22 ENCOUNTER — Encounter: Payer: Self-pay | Admitting: Internal Medicine

## 2020-10-22 ENCOUNTER — Other Ambulatory Visit: Payer: Self-pay

## 2020-10-22 ENCOUNTER — Ambulatory Visit (INDEPENDENT_AMBULATORY_CARE_PROVIDER_SITE_OTHER): Payer: Medicare Other | Admitting: Internal Medicine

## 2020-10-22 VITALS — BP 147/87 | HR 84 | Temp 97.7°F | Ht 67.0 in | Wt 188.6 lb

## 2020-10-22 DIAGNOSIS — M810 Age-related osteoporosis without current pathological fracture: Secondary | ICD-10-CM | POA: Diagnosis not present

## 2020-10-22 DIAGNOSIS — F32A Depression, unspecified: Secondary | ICD-10-CM

## 2020-10-22 DIAGNOSIS — I350 Nonrheumatic aortic (valve) stenosis: Secondary | ICD-10-CM

## 2020-10-22 DIAGNOSIS — Z0000 Encounter for general adult medical examination without abnormal findings: Secondary | ICD-10-CM

## 2020-10-22 DIAGNOSIS — I10 Essential (primary) hypertension: Secondary | ICD-10-CM | POA: Diagnosis not present

## 2020-10-22 DIAGNOSIS — Z0001 Encounter for general adult medical examination with abnormal findings: Secondary | ICD-10-CM | POA: Diagnosis not present

## 2020-10-22 DIAGNOSIS — E0590 Thyrotoxicosis, unspecified without thyrotoxic crisis or storm: Secondary | ICD-10-CM

## 2020-10-22 LAB — T4, FREE: Free T4: 0.8 ng/dL (ref 0.60–1.60)

## 2020-10-22 LAB — BASIC METABOLIC PANEL
BUN: 32 mg/dL — ABNORMAL HIGH (ref 6–23)
CO2: 28 mEq/L (ref 19–32)
Calcium: 9.1 mg/dL (ref 8.4–10.5)
Chloride: 104 mEq/L (ref 96–112)
Creatinine, Ser: 0.93 mg/dL (ref 0.40–1.20)
GFR: 57.19 mL/min — ABNORMAL LOW (ref >60.00)
Glucose, Bld: 92 mg/dL (ref 70–99)
Potassium: 4.2 mEq/L (ref 3.5–5.1)
Sodium: 139 mEq/L (ref 135–145)

## 2020-10-22 LAB — TSH: TSH: 3.14 u[IU]/mL (ref 0.35–4.50)

## 2020-10-22 MED FILL — SHINGRIX 50 MCG SUS: 50 | 1 days supply | Qty: 1 | Fill #0

## 2020-10-22 NOTE — Patient Instructions (Addendum)
Check the  blood pressure twice weekly, please call me in 4 weeks and let me know your blood pressure readings at home.  BP GOAL is between 110/65 and  135/85.   Please consider getting Shingrix at your pharmacy  We will schedule a bone density test  GO TO THE LAB : Get the blood work     [place], [place] back for for a checkup in 4 months   Fall Prevention in the Home, Adult Falls can cause injuries and can affect people from all age groups. There are many simple things that you can do to make your home safe and to help prevent falls. Ask for help when making these changes, if needed. What actions can I take to prevent falls? General instructions  Use good lighting in all rooms. Replace any light bulbs that burn out.  Turn on lights if it is dark. Use night-lights.  Place frequently used items in easy-to-reach places. Lower the shelves around your home if necessary.  Set up furniture so that there are clear paths around it. Avoid moving your furniture around.  Remove throw rugs and other tripping hazards from the floor.  Avoid walking on wet floors.  Fix any uneven floor surfaces.  Add color or contrast paint or tape to grab bars and handrails in your home. Place contrasting color strips on the first and last steps of stairways.  When you use a stepladder, make sure that it is completely opened and that the sides are firmly locked. Have someone hold the ladder while you are using it. Do not climb a closed stepladder.  Be aware of any and all pets. What can I do in the bathroom?  Keep the floor dry. Immediately clean up any water that spills onto the floor.  Remove soap buildup in the tub or shower on a regular basis.  Use non-skid mats or decals on the floor of the tub or shower.  Attach bath mats securely with double-sided, non-slip rug tape.  If you need to sit down while you are in the shower, use a plastic, non-slip  stool.  Install grab bars by the toilet and in the tub and shower. Do not use towel bars as grab bars.      What can I do in the bedroom?  Make sure that a bedside light is easy to reach.  Do not use oversized bedding that drapes onto the floor.  Have a firm chair that has side arms to use for getting dressed. What can I do in the kitchen?  Clean up any spills right away.  If you need to reach for something above you, use a sturdy step stool that has a grab bar.  Keep electrical cables out of the way.  Do not use floor polish or wax that makes floors slippery. If you must use wax, make sure that it is non-skid floor wax. What can I do in the stairways?  Do not leave any items on the stairs.  Make sure that you have a light switch at the top of the stairs and the bottom of the stairs. Have them installed if you do not have them.  Make sure that there are handrails on both sides of the stairs. Fix handrails that are broken or loose. Make sure that handrails are as long as the stairways.  Install non-slip stair treads on all stairs in your home.  Avoid having throw rugs at the top  or bottom of stairways, or secure the rugs with carpet tape to prevent them from moving.  Choose a carpet design that does not hide the edge of steps on the stairway.  Check any carpeting to make sure that it is firmly attached to the stairs. Fix any carpet that is loose or worn. What can I do on the outside of my home?  Use bright outdoor lighting.  Regularly repair the edges of walkways and driveways and fix any cracks.  Remove high doorway thresholds.  Trim any shrubbery on the main path into your home.  Regularly check that handrails are securely fastened and in good repair. Both sides of any steps should have handrails.  Install guardrails along the edges of any raised decks or porches.  Clear walkways of debris and clutter, including tools and rocks.  Have leaves, snow, and ice cleared  regularly.  Use sand or salt on walkways during winter months.  In the garage, clean up any spills right away, including grease or oil spills. What other actions can I take?  Wear closed-toe shoes that fit well and support your feet. Wear shoes that have rubber soles or low heels.  Use mobility aids as needed, such as canes, walkers, scooters, and crutches.  Review your medicines with your health care provider. Some medicines can cause dizziness or changes in blood pressure, which increase your risk of falling. Talk with your health care provider about other ways that you can decrease your risk of falls. This may include working with a physical therapist or trainer to improve your strength, balance, and endurance. Where to find more information  Centers for Disease Control and Prevention, [place]: WebmailGuide.co.za  [employer] on Aging: BrainJudge.co.uk Contact a health care provider if:  You are afraid of falling at home.  You feel weak, drowsy, or dizzy at home.  You fall at home. Summary  There are many simple things that you can do to make your home safe and to help prevent falls.  Ways to make your home safe include removing tripping hazards and installing grab bars in the bathroom.  Ask for help when making these changes in your home. This information is not intended to replace advice given to you by your health care provider. Make sure you discuss any questions you have with your health care provider. Document Revised: 08/13/2017 Document Reviewed: 04/15/2017 Elsevier Patient Education  2021 [place] Directive  Advance directives are legal documents that allow you to make decisions about your health care and medical treatment in case you become unable to communicate for yourself. Advance directives let your wishes be known to family, friends, and health care providers. Discussing and writing advance directives should happen over time  rather than all at once. Advance directives can be changed and updated at any time. There are different types of advance directives, such as:  Medical power of attorney.  Living will.  Do not resuscitate (DNR) order or do not attempt resuscitation (DNAR) order. Health care proxy and medical power of attorney A health care proxy is also called a health care agent. This person is appointed to make medical decisions for you when you are unable to make decisions for yourself. Generally, people ask a trusted friend or family member to act as their proxy and represent their preferences. Make sure you have an agreement with your trusted person to act as your proxy. A proxy may have to make a medical decision on your behalf if your  wishes are not known. A medical power of attorney, also called a durable power of attorney for health care, is a legal document that names your health care proxy. Depending on the laws in your state, the document may need to be:  Signed.  Notarized.  Dated.  Copied.  Witnessed.  Incorporated into your medical record. You may also want to appoint a trusted person to manage your money in the event you are unable to do so. This is called a durable power of attorney for finances. It is a separate legal document from the durable power of attorney for health care. You may choose your health care proxy or someone different to act as your agent in money matters. If you do not appoint a proxy, or there is a concern that the proxy is not acting in your best interest, a court may appoint a guardian to act on your behalf. Living will A living will is a set of instructions that state your wishes about medical care when you cannot express them yourself. Health care providers should keep a copy of your living will in your medical record. You may want to give a copy to family members or friends. To alert caregivers in case of an emergency, you can place a card in your wallet to let them  know that you have a living will and where they can find it. A living will is used if you become:  Terminally ill.  Disabled.  Unable to communicate or make decisions. The following decisions should be included in your living will:  To use or not to use life support equipment, such as dialysis machines and breathing machines (ventilators).  Whether you want a DNR or DNAR order. This tells health care providers not to use cardiopulmonary resuscitation (CPR) if breathing or heartbeat stops.  To use or not to use tube feeding.  To be given or not to be given food and fluids.  Whether you want comfort (palliative) care when the goal becomes comfort rather than a cure.  Whether you want to donate your organs and tissues. A living will does not give instructions for distributing your money and property if you should pass away. DNR or DNAR A DNR or DNAR order is a request not to have CPR in the event that your heart stops beating or you stop breathing. If a DNR or DNAR order has not been made and shared, a health care provider will try to help any patient whose heart has stopped or who has stopped breathing. If you plan to have surgery, talk with your health care provider about how your DNR or DNAR order will be followed if problems occur. What if I do not have an advance directive? Some states assign family decision makers to act on your behalf if you do not have an advance directive. Each state has its own laws about advance directives. You may want to check with your health care provider, attorney, or state representative about the laws in your state. Summary  Advance directives are legal documents that allow you to make decisions about your health care and medical treatment in case you become unable to communicate for yourself.  The process of discussing and writing advance directives should happen over time. You can change and update advance directives at any time.  Advance directives  may include a medical power of attorney, a living will, and a DNR or DNAR order. This information is not intended to replace advice given to you  by your health care provider. Make sure you discuss any questions you have with your health care provider. Document Revised: 06/04/2020 Document Reviewed: 06/04/2020 Elsevier Patient Education  2021 [employer].

## 2020-10-22 NOTE — Progress Notes (Signed)
Subjective:    Patient ID: [name], female    DOB: [birth year], 85 y.o.   MRN: [id number]  DOS:  10/22/2020 Type of visit - description: CPX In general feels well and has no major concerns. Her PHQ-9 is high at 16 however she reports she is not really feeling sad, down or depressed per se.  See comments under assessment and plan.  BP Readings from Last 3 Encounters:  10/22/20 (!) 147/87  08/27/20 137/75  08/11/20 (!) 143/78     Review of Systems  Other than above, a 14 point review of systems is negative    Past Medical History:  Diagnosis Date  . Aortic stenosis    ECHO 5-20ll mild AS but no evidence of HCM or MR--no further testing suggested   . Blood transfusion without reported diagnosis 1971   after hysterectomy  . Cancer ([place]) 2019   squamous cell carcinoma- on chest  . Depression   . GERD (gastroesophageal reflux disease)   . History of gastroesophageal reflux (GERD)   . Hx of adenomatous colonic polyps   . Hx of cardiac cath 2006   neg  . Hypertension   . Mixed urge and stress incontinence   . OAB (overactive bladder) 07/13/2012  . Osteoarthritis   . Osteopenia    rx fosamax 08-2013  . Personal history of colonic polyps - adenomas 04/19/2014    Past Surgical History:  Procedure Laterality Date  . ABDOMINAL HYSTERECTOMY  1971  . APPENDECTOMY  1971   (at time of hyst?)  . BLADDER SURGERY  2003  . CATARACT EXTRACTION, BILATERAL    . CHOLECYSTECTOMY    . OOPHORECTOMY  1971  . ROTATOR CUFF REPAIR Right   . TOE SURGERY  1995   R 2nd --- correction of claw toe     Allergies as of 10/22/2020   No Known Allergies     Medication List       Accurate as of October 22, 2020 11:59 PM. If you have any questions, ask your nurse or doctor.        alendronate 70 MG tablet Commonly known as: FOSAMAX TAKE 1 TABLET BY MOUTH  EVERY 7 DAYS WITH A FULL  GLASS OF WATER ON AN EMPTY  STOMACH What changed: See the new instructions.   B-complex with vitamin C  tablet Take 1 tablet by mouth daily.   Cholecalciferol 25 MCG (1000 UT) capsule Take 1,000 Units by mouth daily.   FISH OIL PO Take by mouth.   GERITOL PO Take by mouth daily.   memantine 5 MG tablet Commonly known as: Namenda Take 1 tablet (5 mg total) by mouth 2 (two) times daily.   NIFEdipine 60 MG 24 hr tablet Commonly known as: PROCARDIA XL/NIFEDICAL XL TAKE 1 TABLET BY MOUTH  DAILY   OCUVITE PO Take by mouth.   omeprazole 20 MG capsule Commonly known as: PRILOSEC Take 1 capsule (20 mg total) by mouth 2 (two) times daily before a meal.   oxybutynin 5 MG tablet Commonly known as: DITROPAN Take 1 tablet (5 mg total) by mouth 3 (three) times daily.   simvastatin 20 MG tablet Commonly known as: ZOCOR TAKE 1 TABLET BY MOUTH  DAILY   venlafaxine XR 75 MG 24 hr capsule Commonly known as: EFFEXOR-XR Take 75 mg by mouth daily with breakfast.   VITAMIN C PO Take by mouth daily.   vitamin E 600 UNIT capsule Take 600 Units by mouth daily.  Objective:   Physical Exam BP (!) 147/87 (BP Location: Left Arm, Patient Position: Sitting, Cuff Size: Large)   Pulse 84   Temp 97.7 F (36.5 C) (Oral)   Ht 5\' 7"  (1.702 m)   Wt 188 lb 9.6 oz (85.5 kg)   SpO2 97%   BMI 29.54 kg/m  General: Well developed, NAD, BMI noted Neck: No  thyromegaly  HEENT:  Normocephalic . Face symmetric, atraumatic Lungs:  CTA B Normal respiratory effort, no intercostal retractions, no accessory muscle use. Heart: RRR, soft systolic murmur.  Abdomen:  Not distended, soft, non-tender. No rebound or rigidity.   Lower extremities: no pretibial edema bilaterally  Skin: Exposed areas without rash. Not pale. Not jaundice Neurologic:  alert & oriented to self, place, time and circumstance Speech normal, gait appropriate for age and unassisted Strength symmetric and appropriate for age.  Psych: Cognition and judgment appear intact.  Cooperative with normal attention span and  concentration.  Behavior appropriate. No anxious or depressed appearing.     Assessment     Assessment HTN Hyperlipidemia  GERD Depression Dementia: DX 02/2020: MRI brain no acute, labs negative, Rx Namenda Osteoporosis:  T score plus ankle fracture 03-2019: t score 2008 -1.8, 2011 -1.8, 2014 -2.1: Fosamax Rx 08-2013 t score 03-2016: -2.1. H/o vit d def  DJD Aortic stenosis ( at some point diagnosed with HOCM) ---echo 01-2010: "Mild AS but no evidence of HCM or MR. ---Echo 6-15 mild AoS. Overactive bladder- pcp started to rx meds 09-2016  Ao [place]: wnl 02-2015  PLAN: Here for CPX HTN: Currently on Procardia, BP today slightly elevated, at home in the 140s over 70s, no change, rec to cont  check at home. Hyperlipidemia: Well-controlled per last FLP, continue simvastatin Depression, PHQ-9: 8 elevated, however most of the points are from feeling tired and overeating. Denies depression per se.  Continue Effexor. Osteoporosis: Took alendronate intermittently for 3 years, taking it regularly since 04-2019.  No history of vitamin D deficiency.  Check DEXA.  Consider Fosamax holiday in 6 months Aortic stenosis: Murmur noted, some fatigue at baseline. Echo 05-2019: EF 50%, aorta: Moderate thickening of the valve, moderate calcific calcium, mild stenosis.  Continue observation Dementia, she is here by herself, reports she is doing well, she drives, goes shopping, pays bills and her daughter is the POA and helps with finances.  Continue Namenda. RTC 4 months  This visit occurred during the SARS-CoV-2 public health emergency.  Safety protocols were in place, including screening questions prior to the visit, additional usage of staff PPE, and extensive cleaning of exam room while observing appropriate contact time as indicated for disinfecting solutions.

## 2020-10-23 ENCOUNTER — Encounter: Payer: Self-pay | Admitting: Internal Medicine

## 2020-10-23 NOTE — Assessment & Plan Note (Signed)
Here for CPX HTN: Currently on Procardia, BP today slightly elevated, at home in the 140s over 70s, no change, rec to cont  check at home. Hyperlipidemia: Well-controlled per last FLP, continue simvastatin Depression, PHQ-9: 8 elevated, however most of the points are from feeling tired and overeating. Denies depression per se.  Continue Effexor. Osteoporosis: Took alendronate intermittently for 3 years, taking it regularly since 04-2019.  No history of vitamin D deficiency.  Check DEXA.  Consider Fosamax holiday in 6 months Aortic stenosis: Murmur noted, some fatigue at baseline. Echo 05-2019: EF 50%, aorta: Moderate thickening of the valve, moderate calcific calcium, mild stenosis.  Continue observation Dementia, she is here by herself, reports she is doing well, she drives, goes shopping, pays bills and her daughter is the POA and helps with finances.  Continue Namenda. RTC 4 months

## 2020-10-23 NOTE — Assessment & Plan Note (Signed)
--  Tdap 2020 -pneumonia shot 2005, 2010, 2021 -prevnar:2015 - zostavax 2012 -shingrex discussed today  -COVID vax x 3  - had a flu shot  --nofurther PAPs --Last MMG  03-2016.  Further screening optional per guidelines -- CEY:[facility] colonoscopy 04/2014, no f/u per letter  --Labs: BMP, TSH, free T4 --Patient education: Fall prevention, advance directives (daughter Lelon Frohlich is her power of attorney).

## 2020-10-28 ENCOUNTER — Ambulatory Visit (HOSPITAL_BASED_OUTPATIENT_CLINIC_OR_DEPARTMENT_OTHER)
Admission: RE | Admit: 2020-10-28 | Discharge: 2020-10-28 | Disposition: A | Discharge status: Home / Self Care | Payer: Medicare Other | Source: Ambulatory Visit | Attending: Internal Medicine | Admitting: Internal Medicine

## 2020-10-28 ENCOUNTER — Other Ambulatory Visit: Payer: Self-pay

## 2020-10-28 DIAGNOSIS — M85831 Other specified disorders of bone density and structure, right forearm: Secondary | ICD-10-CM | POA: Diagnosis not present

## 2020-10-28 DIAGNOSIS — M810 Age-related osteoporosis without current pathological fracture: Secondary | ICD-10-CM | POA: Diagnosis not present

## 2020-10-28 DIAGNOSIS — Z780 Asymptomatic menopausal state: Secondary | ICD-10-CM | POA: Diagnosis not present

## 2020-10-30 ENCOUNTER — Telehealth: Payer: Self-pay | Admitting: Internal Medicine

## 2020-10-30 ENCOUNTER — Other Ambulatory Visit: Payer: Self-pay | Admitting: Internal Medicine

## 2020-10-30 NOTE — Telephone Encounter (Signed)
T score -2.7, currently on Fosamax, osteoporosis getting worse: Advised patient, recommend to switch to Prolia.

## 2020-10-31 NOTE — Telephone Encounter (Signed)
Clinical information has been submitted. Waiting on summary of benefits. Will call patient once received regarding cost.

## 2020-10-31 NOTE — Telephone Encounter (Signed)
Can you start Prolia benefits for her? I will inform her how much it is (if any) after we get her benefits back.

## 2020-11-01 NOTE — Progress Notes (Signed)
Chronic Care Management Pharmacy Note  11/08/2020 Name:  [name] MRN:  [id number] DOB:  02/27/1936  Subjective: [name] is an 85 y.o. year old female who is a primary patient of Paz, Alda Berthold, MD.  The CCM team was consulted for assistance with disease management and care coordination needs.    Engaged with patient by telephone for follow up visit in response to provider referral for pharmacy case management and/or care coordination services.   Consent to Services:  The patient was given the following information about Chronic Care Management services today, agreed to services, and gave verbal consent: 1. CCM service includes personalized support from designated clinical staff supervised by the primary care provider, including individualized plan of care and coordination with other care providers 2. 24/7 contact phone numbers for assistance for urgent and routine care needs. 3. Service will only be billed when office clinical staff spend 20 minutes or more in a month to coordinate care. 4. Only one practitioner may furnish and bill the service in a calendar month. 5.The patient may stop CCM services at any time (effective at the end of the month) by phone call to the office staff. 6. The patient will be responsible for cost sharing (co-pay) of up to 20% of the service fee (after annual deductible is met). Patient agreed to services and consent obtained.  Patient Care Team: Colon Branch, MD as PCP - General Festus Aloe, MD as Consulting Physician (Urology) Gatha Mayer, MD as Consulting Physician (Gastroenterology) Edythe Clarity, [facility] (Pharmacist)  Recent office visits: 10/22/20 Larose Kells) - no changes to meds, consider Fosamax holiday in 6 months.  08/27/21 Larose Kells) - ER follow up after fall, reports nosebleed, no medication changes, fall precautions  Recent consult visits: None since last CCM visit  Hospital visits: 08/11/20 Alvino Chapel) - fall stepping over a curb at the  store  Objective:  Lab Results  Component Value Date   CREATININE 0.93 10/22/2020   BUN 32 (H) 10/22/2020   GFR 57.19 (L) 10/22/2020   GFRNONAA 51 (L) 07/16/2020   GFRAA 91 06/15/2008   NA 139 10/22/2020   K 4.2 10/22/2020   CALCIUM 9.1 10/22/2020   CO2 28 10/22/2020    Lab Results  Component Value Date/Time   HGBA1C 5.7 07/31/2020 01:54 PM   GFR 57.19 (L) 10/22/2020 10:30 AM   GFR 54.98 (L) 02/26/2020 02:54 PM    Last diabetic Eye exam: No results found for: HMDIABEYEEXA  Last diabetic Foot exam: No results found for: HMDIABFOOTEX   Lab Results  Component Value Date   CHOL 164 07/31/2020   HDL 59.50 07/31/2020   LDLCALC 80 07/31/2020   LDLDIRECT 163.4 12/13/2009   TRIG 122.0 07/31/2020   CHOLHDL 3 07/31/2020    Hepatic Function Latest Ref Rng & Units 02/26/2020 05/16/2019 03/16/2017  Total Protein 6.0 - 8.3 g/dL 6.9 6.8 7.2  Albumin 3.5 - 5.2 g/dL 4.7 4.4 4.3  AST 0 - 37 U/L 18 14 16   ALT 0 - 35 U/L 20 15 17   Alk Phosphatase 39 - 117 U/L 41 59 61  Total Bilirubin 0.2 - 1.2 mg/dL 0.3 0.4 0.3  Bilirubin, Direct 0.0 - 0.3 mg/dL - - -    Lab Results  Component Value Date/Time   TSH 3.14 10/22/2020 10:30 AM   TSH 3.14 02/26/2020 02:54 PM   FREET4 0.80 10/22/2020 10:30 AM    CBC Latest Ref Rng & Units 07/16/2020 02/26/2020 05/16/2019  WBC 4.0 - 10.5  K/uL 7.3 6.8 4.4  Hemoglobin 12.0 - 15.0 g/dL 12.5 12.5 12.5  Hematocrit 36.0 - 46.0 % 38.2 36.3 37.5  Platelets 150 - 400 K/uL 199 234.0 207.0    Lab Results  Component Value Date/Time   VD25OH 40 01/12/2012 09:18 AM   VD25OH 80 12/17/2010 09:54 AM    Clinical ASCVD: No  The ASCVD Risk score Mikey Bussing DC Jr., et al., 2013) failed to calculate for the following reasons:   The 2013 ASCVD risk score is only valid for ages 52 to 23    Depression screen PHQ 2/9 10/22/2020 07/31/2020 05/02/2020  Decreased Interest 3 1 0  Down, Depressed, Hopeless 2 0 0  PHQ - 2 Score 5 1 0  Altered sleeping 3 0 -  Tired, decreased energy 3  2 -  Change in appetite 3 2 -  Feeling bad or failure about yourself  0 0 -  Trouble concentrating 0 1 -  Moving slowly or fidgety/restless 2 1 -  Suicidal thoughts 0 0 -  PHQ-9 Score 16 7 -  Difficult doing work/chores Not difficult at all Not difficult at all -    Social History   Tobacco Use  Smoking Status Never Smoker  Smokeless Tobacco Never Used   BP Readings from Last 3 Encounters:  10/22/20 (!) 147/87  08/27/20 137/75  08/11/20 (!) 143/78   Pulse Readings from Last 3 Encounters:  10/22/20 84  08/27/20 66  08/11/20 87   Wt Readings from Last 3 Encounters:  10/22/20 188 lb 9.6 oz (85.5 kg)  08/27/20 182 lb 3.2 oz (82.6 kg)  08/11/20 180 lb 4 oz (81.8 kg)    Assessment/Interventions: Review of patient past medical history, allergies, medications, health status, including review of consultants reports, laboratory and other test data, was performed as part of comprehensive evaluation and provision of chronic care management services.   SDOH:  (Social Determinants of Health) assessments and interventions performed: No   CCM Care Plan  No Known Allergies  Medications Reviewed Today    Reviewed by Edythe Clarity, [facility] (Pharmacist) on 11/08/20 at 1523  Med List Status: <None>  Medication Order Taking? Sig Documenting Provider Last Dose Status Informant  alendronate (FOSAMAX) 70 MG tablet [id number] Yes TAKE 1 TABLET BY MOUTH  EVERY 7 DAYS WITH A FULL  GLASS OF WATER ON AN EMPTY  STOMACH  Patient taking differently: Take 70 mg by mouth once a week.   Colon Branch, MD Taking Active            Med Note De Blanch   Tue Aug 06, 2020 11:42 AM) Monday  Ascorbic Acid (VITAMIN C PO) [id number] Yes Take by mouth daily. [provider] Taking Active Self  B Complex-C (B-COMPLEX WITH VITAMIN C) tablet [id number] Yes Take 1 tablet by mouth daily. [provider] Taking Active Self  Cholecalciferol 25 MCG (1000 UT) capsule [id number] Yes Take 1,000 Units by  mouth daily. [provider] Taking Active Self  Iron-Vitamins (GERITOL PO) [id number] Yes Take by mouth daily. [provider] Taking Active Self  memantine (NAMENDA) 5 MG tablet [id number] Yes Take 1 tablet (5 mg total) by mouth 2 (two) times daily. Colon Branch, MD Taking Active   Multiple Vitamins-Minerals (OCUVITE PO) [id number] Yes Take by mouth. [provider] Taking Active Self  NIFEdipine (PROCARDIA XL/NIFEDICAL XL) 60 MG 24 hr tablet [id number] Yes TAKE 1 TABLET BY MOUTH  DAILY  Patient taking differently: Take 60 mg by mouth daily.  Colon Branch, MD Taking Active   Omega-3 Fatty Acids (FISH OIL PO) [id number] Yes Take by mouth. [provider] Taking Active Self  omeprazole (PRILOSEC) 20 MG capsule [id number] Yes Take 1 capsule (20 mg total) by mouth 2 (two) times daily before a meal. Colon Branch, MD Taking Active   oxybutynin (DITROPAN) 5 MG tablet [id number] Yes Take 1 tablet (5 mg total) by mouth 3 (three) times daily. Colon Branch, MD Taking Active   simvastatin (ZOCOR) 20 MG tablet [id number] Yes TAKE 1 TABLET BY MOUTH  DAILY  Patient taking differently: Take 20 mg by mouth daily.   Colon Branch, MD Taking Active   venlafaxine XR (EFFEXOR-XR) 75 MG 24 hr capsule [id number] Yes Take 75 mg by mouth daily with breakfast.  Colon Branch, MD Taking Active            Med Note De Blanch   Tue Aug 06, 2020 11:39 AM) Taking #1 daily since 06/2020  vitamin E 600 UNIT capsule [id number] Yes Take 600 Units by mouth daily. [provider] Taking Active Self          Patient Active Problem List   Diagnosis Date Noted  . Closed fracture of left distal radius 07/31/2019  . Closed nondisplaced fracture of lateral malleolus of right fibula 01/17/2019  . PCP NOTES >>>>>>>>>>>>>>>> 09/03/2015  . Personal history of colonic polyps - adenomas 04/19/2014  . OAB (overactive bladder) 07/13/2012  . Annual physical exam 12/17/2010  . Aortic stenosis 01/01/2010  .  Hyperlipidemia 11/21/2008  . GERD 11/28/2007  . Depression 06/24/2007  . Essential hypertension 06/24/2007  . OSTEOARTHRITIS 06/24/2007  . Osteoporosis 06/24/2007    Immunization History  Administered Date(s) Administered  . Fluad Quad(high Dose 65+) 06/01/2019  . Influenza Split 07/15/2011, 06/01/2012  . Influenza Whole 08/18/2007, 06/15/2008, 06/12/2009, 06/12/2010  . Influenza, High Dose Seasonal PF 07/26/2018  . Influenza,inj,Quad PF,6+ Mos 06/06/2013, 06/11/2014, 05/24/2015  . Influenza-Unspecified 08/14/2016, 06/30/2020  . PFIZER(Purple Top)SARS-COV-2 Vaccination 10/09/2019, 10/30/2019, 06/30/2020  . Pneumococcal Conjugate-13 02/14/2014  . Pneumococcal Polysaccharide-23 09/15/2003, 11/21/2008, 07/31/2020  . Td 06/24/2007  . Tdap 05/13/2019  . Zoster 08/17/2011  . Zoster Recombinat (Shingrix) 10/22/2020    Conditions to be addressed/monitored:  Hypertension, Hyperlipidemia, Osteopenia, Depression, Dementia, GERD, Overactive Bladder   Care Plan : General Pharmacy (Adult)  Updates made by Edythe Clarity, RPH since 11/08/2020 12:00 AM    Problem: HTN, hld, gerd, osteoporosis   Priority: High  Onset Date: 11/08/2020    Long-Range Goal: Patient-Specific Goal   Start Date: 11/08/2020  Expected End Date: 05/08/2021  This Visit's Progress: On track  Priority: High  Note:   Current Barriers:  . Unable to maintain normal bone density.  . Lack of home monitoring of BP.  Pharmacist Clinical Goal(s):  Marland Kitchen Over the next 90 days, patient will achieve adherence to monitoring guidelines and medication adherence to achieve therapeutic efficacy . maintain control of blood pressure as evidenced by home monitoring  . Switch to Prolia for osteoporosis. through collaboration with PharmD and provider.   Interventions: . 1:1 collaboration with Colon Branch, MD regarding development and update of comprehensive plan of care as evidenced by provider attestation and  co-signature . Inter-disciplinary care team collaboration (see longitudinal plan of care) . Comprehensive medication review performed; medication list updated in electronic medical record  Hypertension (BP goal <140/90) -Controlled -Current treatment:  Nifedipine 55m daily -Medications previously tried: none noted  -Current home readings: does not check,  no logs available  -Denies hypotensive/hypertensive symptoms -Educated on BP goals and benefits of medications for prevention of heart attack, stroke and kidney damage; Importance of home blood pressure monitoring; -Counseled to monitor BP at home at least a few times per month, document, and provide log at future appointments -Recommended to continue current medication  Hyperlipidemia: (LDL goal < 100) -Controlled -Current treatment: . Simvastatin 40m daily -Medications previously tried: none noted  -Current dietary patterns:  Diet B - Protein shake and protein bar with 30g of protein each L - none (she snacks throughout the day) D - Meat and 2 veggies, but feels she doesn't eat enough veggies Snacks - chips, cookies, PB crackers (she loves M&Ms) -Reviewed most recent lipid panel -Educated on Cholesterol goals;  Benefits of statin for ASCVD risk reduction; -Recommended to continue current medication  Osteoporosis -Controlled -Last DEXA Scan: 10/28/20   T score -2.7 R Femur neck -Patient is a candidate for pharmacologic treatment due to T-Score < -2.5 in femoral neck -Current treatment  . Alendronate 784mweekly . Vitamin D 1000 units daily -Medications previously tried: nonte noted  -Counseled on oral bisphosphonate administration: take in the morning, 30 minutes prior to food with 6-8 oz of water. Do not lie down for at least 30 minutes after taking. Per Dr. PaLarose Kellsost recent note, they are working on switching patient to Prolia pending insurance coverage. -Recommended to continue current medication Recommended patient  await notification from office on Prolia.  BMD has gotten worse on Fosamax so agree with transition to Prolia.  GERD (Goal: Minimize symptoms) Update 08/06/20 We discussed the risk/benefit of continuation vs discontinuation of long term PPI use especially noting her fracture risk. Consider decreasing omeprazole to once daily pending Dr. PaLarose Kellspproval.   -Controlled -Current treatment  . Omeprazole 2057mwice daily -Medications previously tried: none noted -Patient mentions symptoms have been controlled lately, she never did taper off medication or go down to once daily dosing  -Recommended to continue current medication Consider decrease again considering worsening bone density scan and lack of symptoms.   Patient Goals/Self-Care Activities . Over the next 90 days, patient will:  - take medications as prescribed check blood pressure periodically, document, and provide at future appointments Await word on switch to Prolia.  Follow Up Plan: The care management team will reach out to the patient again over the next 90 days.         Medication Assistance: None required.  Patient affirms current coverage meets needs.  [facility] [address]: [phone]: [phone]: [phone]: [phone]: [phone]: [phone] pill box? No - vials Pt endorses 100% compliance  We discussed: Benefits of medication synchronization, packaging and delivery as well as enhanced pharmacist oversight with [employer]. Patient decided to: Continue current medication management strategy  Care Plan and  Follow Up Patient Decision:  Patient agrees to Care Plan and Follow-up.  Plan: The care management team will reach out to the patient again over the next 90 days.  ChrBeverly MilchharmD Clinical Pharmacist [place] [phone]

## 2020-11-06 ENCOUNTER — Telehealth: Payer: Self-pay | Admitting: Internal Medicine

## 2020-11-06 NOTE — Telephone Encounter (Signed)
FYI

## 2020-11-06 NOTE — Telephone Encounter (Signed)
Caller : [place]  Call Back @ [phone]  Pt's BP reading   130/61 131/74 141/77 138/78 130/78 137/77 139/78

## 2020-11-06 NOTE — Telephone Encounter (Signed)
Very good readings, continue same meds

## 2020-11-07 NOTE — Telephone Encounter (Signed)
Spoke w/ Lelon Frohlich, Pt's daughter, informed of recommendations. Ann verbalized understanding.

## 2020-11-08 ENCOUNTER — Ambulatory Visit (INDEPENDENT_AMBULATORY_CARE_PROVIDER_SITE_OTHER): Payer: Medicare Other

## 2020-11-08 DIAGNOSIS — M810 Age-related osteoporosis without current pathological fracture: Secondary | ICD-10-CM | POA: Diagnosis not present

## 2020-11-08 DIAGNOSIS — I10 Essential (primary) hypertension: Secondary | ICD-10-CM | POA: Diagnosis not present

## 2020-11-08 DIAGNOSIS — E785 Hyperlipidemia, unspecified: Secondary | ICD-10-CM | POA: Diagnosis not present

## 2020-11-08 DIAGNOSIS — K219 Gastro-esophageal reflux disease without esophagitis: Secondary | ICD-10-CM

## 2020-11-08 NOTE — Patient Instructions (Addendum)
Visit Information  Goals Addressed            This Visit's Progress   . Prevent Falls and Broken Bones-Osteoporosis       Timeframe:  Long-Range Goal Priority:  High Start Date:   11/08/20                          Expected End Date:  05/08/54                     Follow Up Date 02/11/21   - always use handrails on the stairs - get at least 10 minutes of activity every day - make an emergency alert plan in case I fall - pick up clutter from the floors    Why is this important?    When you fall, there are 3 things that control if a bone breaks or not.   These are the fall itself, how hard and the direction that you fall and how fragile your bones are.   Preventing falls is very important for you because of fragile bones.     Notes:       Patient Care Plan: General Pharmacy (Adult)    Problem Identified: HTN, hld, gerd, osteoporosis   Priority: High  Onset Date: 11/08/2020    Long-Range Goal: Patient-Specific Goal   Start Date: 11/08/2020  Expected End Date: 05/08/2021  This Visit's Progress: On track  Priority: High  Note:   Current Barriers:  . Unable to maintain normal bone density.  . Lack of home monitoring of BP.  Pharmacist Clinical Goal(s):  Marland Kitchen Over the next 90 days, patient will achieve adherence to monitoring guidelines and medication adherence to achieve therapeutic efficacy . maintain control of blood pressure as evidenced by home monitoring  . Switch to Prolia for osteoporosis. through collaboration with PharmD and provider.   Interventions: . 1:1 collaboration with Colon Branch, MD regarding development and update of comprehensive plan of care as evidenced by provider attestation and co-signature . Inter-disciplinary care team collaboration (see longitudinal plan of care) . Comprehensive medication review performed; medication list updated in electronic medical record  Hypertension (BP goal <140/90) -Controlled -Current treatment:  Nifedipine 60mg   daily -Medications previously tried: none noted  -Current home readings: does not check, no logs available  -Denies hypotensive/hypertensive symptoms -Educated on BP goals and benefits of medications for prevention of heart attack, stroke and kidney damage; Importance of home blood pressure monitoring; -Counseled to monitor BP at home at least a few times per month, document, and provide log at future appointments -Recommended to continue current medication  Hyperlipidemia: (LDL goal < 100) -Controlled -Current treatment: . Simvastatin 20mg  daily -Medications previously tried: none noted  -Current dietary patterns:  Diet B - Protein shake and protein bar with 30g of protein each L - none (she snacks throughout the day) D - Meat and 2 veggies, but feels she doesn't eat enough veggies Snacks - chips, cookies, PB crackers (she loves M&Ms) -Reviewed most recent lipid panel -Educated on Cholesterol goals;  Benefits of statin for ASCVD risk reduction; -Recommended to continue current medication  Osteoporosis -Controlled -Last DEXA Scan: 10/28/20   T score -2.7 R Femur neck -Patient is a candidate for pharmacologic treatment due to T-Score < -2.5 in femoral neck -Current treatment  . Alendronate 70mg  weekly . Vitamin D 1000 units daily -Medications previously tried: nonte noted  -Counseled on oral bisphosphonate administration: take in the morning,  30 minutes prior to food with 6-8 oz of water. Do not lie down for at least 30 minutes after taking. Per Dr. Larose Kells most recent note, they are working on switching patient to Prolia pending insurance coverage. -Recommended to continue current medication Recommended patient await notification from office on Prolia.  BMD has gotten worse on Fosamax so agree with transition to Prolia.  GERD (Goal: Minimize symptoms) Update 08/06/20 We discussed the risk/benefit of continuation vs discontinuation of long term PPI use especially noting her  fracture risk. Consider decreasing omeprazole to once daily pending Dr. Larose Kells approval.   -Controlled -Current treatment  . Omeprazole 20mg  twice daily -Medications previously tried: none noted -Patient mentions symptoms have been controlled lately, she never did taper off medication or go down to once daily dosing  -Recommended to continue current medication Consider decrease again considering worsening bone density scan and lack of symptoms.   Patient Goals/Self-Care Activities . Over the next 90 days, patient will:  - take medications as prescribed check blood pressure periodically, document, and provide at future appointments Await word on switch to Prolia.  Follow Up Plan: The care management team will reach out to the patient again over the next 90 days.         The patient verbalized understanding of instructions, educational materials, and care plan provided today and agreed to receive a mailed copy of patient instructions, educational materials, and care plan.  The pharmacy team will reach out to the patient again over the next 90 days.   [name], [facility]  Eating Plan for Osteoporosis Osteoporosis causes your bones to become weak and brittle. This puts you at greater risk for bone breaks (fractures) from small bumps or falls. Making changes to your diet and increasing your physical activity can help strengthen your bones and improve your overall health. Calcium and vitamin D are nutrients that play an important role in bone health. Vitamin D helps your body use calcium and strengthen bones. It is important to get enough calcium and vitamin D as part of your eating plan for osteoporosis. What are tips for following this plan? Reading food labels  Try to get at least 1,000 milligrams (mg) of calcium each day.  Look for foods that have at least 50 mg of calcium per serving.  Talk with your health care provider about taking a calcium supplement if you do not get  enough calcium from food.  Do not have more than 2,500 mg of calcium each day. This is the upper limit for food and nutritional supplements combined. Too much calcium may cause constipation and prevent you from absorbing other important nutrients.  Choose foods that contain vitamin D.  Take a daily vitamin supplement that contains 800-1,000 international units (IU) of vitamin D. The amount may be different depending on your age, body weight, and where you live. Talk with your dietitian or health care provider about how much vitamin D is right for you.  Avoid foods that have more than 300 mg of sodium per serving. Too much sodium can cause your body to lose calcium.  Talk with your dietitian or health care provider about how much sodium you are allowed each day. Shopping  Do not buy foods with added salt, including: ? Salted snacks. ? Angie Fava. ? Canned soups. ? Canned meats. ? Processed meats, such as bacon or precooked or cured meat like sausages or meat loaves. ? Smoked fish. Meal planning  Eat balanced meals that contain protein foods, fruits  and vegetables, and foods rich in calcium and vitamin D.  Eat at least 5 servings of fruits and vegetables each day.  Eat 5-6 oz (142-170 g) of lean meat, poultry, fish, eggs, or beans each day. Lifestyle  Do not use any products that contain nicotine or tobacco, such as cigarettes, e-cigarettes, and chewing tobacco. If you need help quitting, ask your health care provider.  If your health care provider recommends that you lose weight: ? Work with a dietitian to develop an eating plan that will help you reach your desired weight goal. ? Exercise for at least 30 minutes a day, 5 or more days a week, or as told by your health care provider.  Work with a physical therapist to develop an exercise plan that includes flexibility, balance, and strength exercises. Do not focus only on aerobic exercise.  Do not drink alcohol if: ? Your health care  provider tells you not to drink. ? You are pregnant, may be pregnant, or are planning to become pregnant.  If you drink alcohol: ? Limit how much you use to:  0-1 drink a day for women.  0-2 drinks a day for men. ? Be aware of how much alcohol is in your drink. In the [place] one drink equals one 12 oz bottle of beer (355 mL), one 5 oz glass of wine (148 mL), or one 1 oz glass of hard liquor (44 mL). What foods should I eat? Foods high in calcium  Yogurt. Yogurt with fruit.  Milk. Evaporated skim milk. Dry milk powder.  Calcium-fortified orange juice.  Parmesan cheese. Part-skim ricotta cheese. Natural hard cheese. Cream cheese. Cottage cheese.  Canned sardines. Canned salmon.  Calcium-treated tofu. Calcium-fortified cereal bar. Calcium-fortified cereal. Calcium-fortified graham crackers.  Cooked collard greens. Turnip greens. Broccoli. Kale.  Almonds.  White beans.  Corn tortilla.   Foods high in vitamin D  Cod liver oil. Fatty fish, such as tuna, mackerel, and salmon.  Milk. Fortified soy milk. Fortified fruit juice.  Yogurt. Margarine.  Egg yolks. Foods high in protein  Beef. Lamb. Pork tenderloin.  Chicken breast.  Tuna (canned). Fish fillet.  Tofu.  Cooked soy beans. Soy patty. Beans (canned or cooked).  Cottage cheese.  Yogurt.  Peanut butter.  Pumpkin seeds. Nuts. Sunflower seeds.  Hard cheese.  Milk or other milk products, such as soy milk. The items listed above may not be a complete list of foods and beverages you can eat. Contact a dietitian for more options. Summary  Calcium and vitamin D are nutrients that play an important role in bone health and are an important part of your eating plan for osteoporosis.  Eat balanced meals that contain protein foods, fruits and vegetables, and foods rich in calcium and vitamin D.  Avoid foods that have more than 300 mg of sodium per serving. Too much sodium can cause your body to lose  calcium.  Exercise is an important part of prevention and treatment of osteoporosis. Aim for at least 30 minutes a day, 5 days a week. This information is not intended to replace advice given to you by your health care provider. Make sure you discuss any questions you have with your health care provider. Document Revised: 02/15/2020 Document Reviewed: 02/15/2020 Elsevier Patient Education  [place].

## 2020-11-08 NOTE — Telephone Encounter (Signed)
Tried calling patient she was not home however I spoke with daughter. Advised to call back to schedule first injection.   Patient prolia approved. Will owe approx 280.

## 2020-11-11 NOTE — Telephone Encounter (Signed)
Patient states $280 is a lot for her to handle. Is there any other alternative?

## 2020-11-12 ENCOUNTER — Other Ambulatory Visit: Payer: Self-pay

## 2020-11-12 NOTE — Telephone Encounter (Signed)
If unable to afford Prolia, we will have to switch her back to Fosamax, send a new prescription.  Reassess the situation next year, perhaps her insurance benefits improved.

## 2020-11-12 NOTE — Telephone Encounter (Signed)
Spoke w/ Lelon Frohlich- informed of bone density results and recommendations to start Prolia, I informed her that we spoke w/ Pt- and each injection is approximately 280 every 6 months. Pt stated this was to expensive. Lelon Frohlich stated she would discuss with Pt to see if they want to switch to Prolia or keep her on Fosamax.

## 2020-11-12 NOTE — Telephone Encounter (Signed)
Pt called back- she has agreed to start Prolia- nurse visit scheduled 11/14/20.

## 2020-11-14 ENCOUNTER — Other Ambulatory Visit: Payer: Self-pay

## 2020-11-14 ENCOUNTER — Ambulatory Visit (INDEPENDENT_AMBULATORY_CARE_PROVIDER_SITE_OTHER): Payer: Medicare Other

## 2020-11-14 DIAGNOSIS — M810 Age-related osteoporosis without current pathological fracture: Secondary | ICD-10-CM | POA: Diagnosis not present

## 2020-11-14 MED ORDER — DENOSUMAB 60 MG/ML ~~LOC~~ SOSY
60.0000 mg | PREFILLED_SYRINGE | Freq: Once | SUBCUTANEOUS | Status: AC
  Administered 2020-11-14: 60 mg via SUBCUTANEOUS

## 2020-11-14 NOTE — Progress Notes (Signed)
[name] is a 85 y.o. female presents to the office today for her first prolia injections, per physician's orders. Original order entered by provider on telephone note from 10-30-2020.  Prolia 60 mg/ml was administered Sq today on left arm. Patient tolerated injection.  Patient next injection due in 6 months, patient will be contacted for appointment after benefits are confirmed in 6 months.   [name]

## 2020-12-20 ENCOUNTER — Other Ambulatory Visit (HOSPITAL_BASED_OUTPATIENT_CLINIC_OR_DEPARTMENT_OTHER): Payer: Self-pay

## 2020-12-20 MED FILL — Zoster Vac Recombinant Adjuvanted for IM Inj 50 MCG/0.5ML: INTRAMUSCULAR | 1 days supply | Qty: 1 | Fill #0 | Status: AC

## 2020-12-25 ENCOUNTER — Other Ambulatory Visit: Payer: Self-pay | Admitting: Internal Medicine

## 2020-12-25 MED ORDER — VENLAFAXINE HCL ER 75 MG PO CP24
75.0000 mg | ORAL_CAPSULE | Freq: Every day | ORAL | 1 refills | Status: DC
Start: 2020-12-25 — End: 2021-07-28

## 2021-01-10 ENCOUNTER — Telehealth: Payer: Self-pay | Admitting: Internal Medicine

## 2021-01-10 NOTE — Telephone Encounter (Signed)
Please advise 

## 2021-01-10 NOTE — Telephone Encounter (Signed)
Spoke w/ Lelon Frohlich- informed letter ready for pick up at front desk.

## 2021-01-10 NOTE — Telephone Encounter (Signed)
Letter printed, will contact Pt's daughter later today to let her know it is ready for pick up.

## 2021-01-10 NOTE — Telephone Encounter (Signed)
Send a letter To whom it may concern Ms. [name] is a patient of mine, she is a widower, it will be on her best interest to move from her current home to a assisted living facility where she will be able to socialize.  That would be excellent for her overall health and particularly for her mental health.

## 2021-01-10 NOTE — Telephone Encounter (Signed)
Pt, daughter called she would like to know if it's possible for Dr.Paz to write a letter for her mother to break her lease to go into an assisted living  Because  her mom is lonely in the apartment alone she needs people around her.   Please advice

## 2021-02-05 ENCOUNTER — Telehealth: Payer: Medicare Other

## 2021-02-11 ENCOUNTER — Ambulatory Visit (INDEPENDENT_AMBULATORY_CARE_PROVIDER_SITE_OTHER): Payer: Medicare Other | Admitting: Pharmacist

## 2021-02-11 DIAGNOSIS — E785 Hyperlipidemia, unspecified: Secondary | ICD-10-CM | POA: Diagnosis not present

## 2021-02-11 DIAGNOSIS — M810 Age-related osteoporosis without current pathological fracture: Secondary | ICD-10-CM | POA: Diagnosis not present

## 2021-02-11 DIAGNOSIS — I10 Essential (primary) hypertension: Secondary | ICD-10-CM

## 2021-02-11 DIAGNOSIS — K219 Gastro-esophageal reflux disease without esophagitis: Secondary | ICD-10-CM

## 2021-02-11 NOTE — Chronic Care Management (AMB) (Signed)
Chronic Care Management Pharmacy Note  02/11/2021 Name:  [name] MRN:  [id number] DOB:  [birth year]  Summary:  Recommendations/Changes made from today's visit:  Plan:  Subjective: [name] is an 85 y.o. year old female who is a primary patient of Paz, Alda Berthold, MD.  The CCM team was consulted for assistance with disease management and care coordination needs.    Engaged with patient by telephone for follow up visit in response to provider referral for pharmacy case management and/or care coordination services.   Consent to Services:  The patient was given information about Chronic Care Management services, agreed to services, and gave verbal consent prior to initiation of services.  Please see initial visit note for detailed documentation.   Patient Care Team: Colon Branch, MD as PCP - General Festus Aloe, MD as Consulting Physician (Urology) Gatha Mayer, MD as Consulting Physician (Gastroenterology) Cherre Robins, PharmD (Pharmacist)  Recent office visits: 10/22/2020 - PCP (Dr Larose Kells) [place]; Elevated PHQ9. BP was 148/87; No medication changes. Cont to check BP at home.  08/27/2020 - PCP (Dr Larose Kells) F/U ED visit for fall. Had nosebleed yesterday; Recommended OTC oxymetazoline if recurrs.   Recent consult visits: No recent visits  Hospital visits: 08/11/2020 - ED Visit for mechanical fall. No LOC; Skin tear to right forearm and swelling at bridge of nose and hematoma to right medial periort; No  fractures noted.  Objective:  Lab Results  Component Value Date   CREATININE 0.93 10/22/2020   CREATININE 1.08 (H) 07/16/2020   CREATININE 0.97 02/26/2020    Lab Results  Component Value Date   HGBA1C 5.7 07/31/2020   Last diabetic Eye exam: No results found for: HMDIABEYEEXA  Last diabetic Foot exam: No results found for: HMDIABFOOTEX      Component Value Date/Time   CHOL 164 07/31/2020 1354   TRIG 122.0 07/31/2020 1354   HDL 59.50 07/31/2020 1354    CHOLHDL 3 07/31/2020 1354   VLDL 24.4 07/31/2020 1354   LDLCALC 80 07/31/2020 1354   LDLDIRECT 163.4 12/13/2009 0000    Hepatic Function Latest Ref Rng & Units 02/26/2020 05/16/2019 03/16/2017  Total Protein 6.0 - 8.3 g/dL 6.9 6.8 7.2  Albumin 3.5 - 5.2 g/dL 4.7 4.4 4.3  AST 0 - 37 U/L 18 14 16   ALT 0 - 35 U/L 20 15 17   Alk Phosphatase 39 - 117 U/L 41 59 61  Total Bilirubin 0.2 - 1.2 mg/dL 0.3 0.4 0.3  Bilirubin, Direct 0.0 - 0.3 mg/dL - - -    Lab Results  Component Value Date/Time   TSH 3.14 10/22/2020 10:30 AM   TSH 3.14 02/26/2020 02:54 PM   FREET4 0.80 10/22/2020 10:30 AM    CBC Latest Ref Rng & Units 07/16/2020 02/26/2020 05/16/2019  WBC 4.0 - 10.5 K/uL 7.3 6.8 4.4  Hemoglobin 12.0 - 15.0 g/dL 12.5 12.5 12.5  Hematocrit 36.0 - 46.0 % 38.2 36.3 37.5  Platelets 150 - 400 K/uL 199 234.0 207.0    Lab Results  Component Value Date/Time   VD25OH 40 01/12/2012 09:18 AM   VD25OH 80 12/17/2010 09:54 AM    Clinical ASCVD: No  The ASCVD Risk score (Beaver., et al., 2013) failed to calculate for the following reasons:   The 2013 ASCVD risk score is only valid for ages 71 to 54    Other: (CHADS2VASc if Afib, PHQ9 if depression, MMRC or CAT for COPD, ACT, DEXA)  Social History   Tobacco Use  Smoking Status Never Smoker  Smokeless Tobacco Never Used   BP Readings from Last 3 Encounters:  10/22/20 (!) 147/87  08/27/20 137/75  08/11/20 (!) 143/78   Pulse Readings from Last 3 Encounters:  10/22/20 84  08/27/20 66  08/11/20 87   Wt Readings from Last 3 Encounters:  10/22/20 188 lb 9.6 oz (85.5 kg)  08/27/20 182 lb 3.2 oz (82.6 kg)  08/11/20 180 lb 4 oz (81.8 kg)    Assessment: Review of patient past medical history, allergies, medications, health status, including review of consultants reports, laboratory and other test data, was performed as part of comprehensive evaluation and provision of chronic care management services.   SDOH:  (Social Determinants of Health)  assessments and interventions performed:  SDOH Interventions   Flowsheet Row Most Recent Value  SDOH Interventions   Financial Strain Interventions Intervention Not Indicated  Physical Activity Interventions Other (Comments)      CCM Care Plan  No Known Allergies  Medications Reviewed Today    Reviewed by Cherre Robins, PharmD (Pharmacist) on 02/11/21 at 54  Med List Status: <None>  Medication Order Taking? Sig Documenting Provider Last Dose Status Informant  Ascorbic Acid (VITAMIN C PO) [id number] Yes Take by mouth daily. [provider] Taking Active Self  Cholecalciferol 25 MCG (1000 UT) capsule [id number] Yes Take 1,000 Units by mouth daily. [provider] Taking Active Self  Cinnamon 500 MG capsule [id number] Yes Take 1,000 mg by mouth 2 (two) times daily. [provider] Taking Active   Iron-Vitamins (GERITOL PO) [id number] Yes Take by mouth daily. [provider] Taking Active Self  memantine (NAMENDA) 5 MG tablet [id number] Yes Take 1 tablet (5 mg total) by mouth 2 (two) times daily. Colon Branch, MD Taking Active   Multiple Vitamins-Minerals (OCUVITE PO) [id number] Yes Take 1 tablet by mouth daily. [provider] Taking Active Self  NIFEdipine (PROCARDIA XL/NIFEDICAL XL) 60 MG 24 hr tablet [id number] Yes Take 1 tablet (60 mg total) by mouth daily. Colon Branch, MD Taking Active   omeprazole (PRILOSEC) 20 MG capsule [id number] Yes Take 1 capsule (20 mg total) by mouth 2 (two) times daily before a meal. Colon Branch, MD Taking Active   oxybutynin (DITROPAN) 5 MG tablet [id number] Yes Take 1 tablet (5 mg total) by mouth 3 (three) times daily. Colon Branch, MD Taking Active   simvastatin (ZOCOR) 20 MG tablet [id number] Yes Take 1 tablet (20 mg total) by mouth daily. Colon Branch, MD Taking Active   venlafaxine XR (EFFEXOR-XR) 75 MG 24 hr capsule [id number] Yes Take 1 capsule (75 mg total) by mouth daily with breakfast. Colon Branch, MD Taking Active    vitamin E 600 UNIT capsule [id number] Yes Take 600 Units by mouth daily. [provider] Taking Active Self        Discontinued 02/11/21 1418 (Completed Course)           Patient Active Problem List   Diagnosis Date Noted  . Closed fracture of left distal radius 07/31/2019  . Closed nondisplaced fracture of lateral malleolus of right fibula 01/17/2019  . PCP NOTES >>>>>>>>>>>>>>>> 09/03/2015  . Personal history of colonic polyps - adenomas 04/19/2014  . OAB (overactive bladder) 07/13/2012  . Annual physical exam 12/17/2010  . Aortic stenosis 01/01/2010  . Hyperlipidemia 11/21/2008  . GERD 11/28/2007  . Depression 06/24/2007  . Essential hypertension 06/24/2007  . OSTEOARTHRITIS 06/24/2007  . Osteoporosis 06/24/2007    Immunization History  Administered Date(s)  Administered  . Fluad Quad(high Dose 65+) 06/01/2019  . Influenza Split 07/15/2011, 06/01/2012  . Influenza Whole 08/18/2007, 06/15/2008, 06/12/2009, 06/12/2010  . Influenza, High Dose Seasonal PF 07/26/2018  . Influenza,inj,Quad PF,6+ Mos 06/06/2013, 06/11/2014, 05/24/2015  . Influenza-Unspecified 08/14/2016, 06/30/2020  . PFIZER(Purple Top)SARS-COV-2 Vaccination 10/09/2019, 10/30/2019, 06/30/2020  . Pneumococcal Conjugate-13 02/14/2014  . Pneumococcal Polysaccharide-23 09/15/2003, 11/21/2008, 07/31/2020  . Td 06/24/2007  . Tdap 05/13/2019  . Zoster Recombinat (Shingrix) 10/22/2020, 12/20/2020  . Zoster, Live 08/17/2011    Conditions to be addressed/monitored: HTN, HLD, Dementia and low B12; osteoporosis; GERD; depression; OAB  There are no care plans that you recently modified to display for this patient.   Medication Assistance: None required.  Patient affirms current coverage meets needs.  Patient's preferred pharmacy is:  [facility], [address], [address] [place], [place] [address] Phone: [phone] Fax: [phone]  [place], [place] [address] [place] [address] Phone: [phone] Fax: [phone]  [place], [place] [address] Phone: [phone] Fax: [phone]   Follow Up:  Patient agrees to Care Plan and Follow-up.  Plan: Telephone follow up appointment with care management team member scheduled for:  6 months  Cherre Robins, PharmD Clinical Pharmacist [facility] [phone]

## 2021-02-11 NOTE — Patient Instructions (Signed)
Visit Information  PATIENT GOALS: Goals Addressed            This Visit's Progress   . Chronic Care Management Pharmacy Care Plan   On track    CARE PLAN ENTRY (see longitudinal plan of care for additional care plan information)  Current Barriers:  . Chronic Disease Management support, education, and care coordination needs related to Hypertension, Hyperlipidemia, Osteopenia, Depression, Dementia, GERD, Overactive Bladder   Hypertension BP Readings from Last 3 Encounters:  10/22/20 (!) 147/87  08/27/20 137/75  08/11/20 (!) 143/78   . Pharmacist Clinical Goal(s): o Over the next 90 days, patient will work with PharmD and providers to maintain BP goal <140/90 . Current regimen:  o Nifedipine 60mg  daily . Patient self care activities - Over the next 90 days, patient will: o Maintain hypertension medication regimen.   Hyperlipidemia Lab Results  Component Value Date/Time   LDLCALC 80 07/31/2020 01:54 PM   LDLDIRECT 163.4 12/13/2009 12:00 AM   . Pharmacist Clinical Goal(s): o Over the next 90 days, patient will work with PharmD and providers to maintain LDL goal < 100 . Current regimen:  o Simvastatin 20mg  daily . Patient self care activities - Over the next 90 days, patient will: o Maintain cholesterol medication regimen.  o Increase exercise as able - try to take advantage of classes at your new independent living facility.  . Goal is to work up to 150 minutes of physical activity per week.   Overactive Bladder . Pharmacist Clinical Goal(s) o Over the next 90 days, patient will work with PharmD and providers to reduce symptoms associated with overactive bladder . Current regimen:  o oxybutynin 5mg  three times daily . Interventions: o Discussed side effects to monitor for . Patient self care activities - Over the next 90 days, patient will: o Maintain overactive bladder medication regimen  Osteopenia/Osteoporosis . Pharmacist Clinical Goal(s) o Over the next 90  days, patient will work with PharmD and providers to reduce symptoms associated with overactive bladder . Current regimen:  . Prolia 60mg  subcutaneously every 6 months . Vitamin D 1000 units daily . Interventions: o Coordinating cancellation of alendronate Rx from mail order pharmacy  . Patient self care activities - Over the next 90 days, patient will: o Continue to take Prolia and vitamin D  Medication management . Pharmacist Clinical Goal(s): o Over the next 90 days, patient will work with PharmD and providers to maintain optimal medication adherence . [facility] . Interventions o Comprehensive medication review performed. o Reviewed recent fill history and assess for adherence issues . Patient self care activities - Over the next 90 days, patient will: o Focus on medication adherence by filling and taking medications appropriately  o Take medications as prescribed o Report any questions or concerns to PharmD and/or provider(s)  Please see past updates related to this goal by clicking on the "Past Updates" button in the selected goal         The patient verbalized understanding of instructions, educational materials, and care plan provided today and declined offer to receive copy of patient instructions, educational materials, and care plan.   The care management team will reach out to the patient again over the next 90 days.   Cherre Robins, PharmD Clinical Pharmacist [facility]-  Fall Prevention in the Home, Adult Falls can cause injuries and can affect people from all age groups. There are many simple things that you can  do to make your home safe and to help prevent falls. Ask for help when making these changes, if needed. What actions can I take to prevent falls? General instructions  Use good lighting in all rooms. Replace any light bulbs that burn out.  Turn on lights if it is dark. Use  night-lights.  Place frequently used items in easy-to-reach places. Lower the shelves around your home if necessary.  Set up furniture so that there are clear paths around it. Avoid moving your furniture around.  Remove throw rugs and other tripping hazards from the floor.  Avoid walking on wet floors.  Fix any uneven floor surfaces.  Add color or contrast paint or tape to grab bars and handrails in your home. Place contrasting color strips on the first and last steps of stairways.  When you use a stepladder, make sure that it is completely opened and that the sides are firmly locked. Have someone hold the ladder while you are using it. Do not climb a closed stepladder.  Be aware of any and all pets. What can I do in the bathroom?  Keep the floor dry. Immediately clean up any water that spills onto the floor.  Remove soap buildup in the tub or shower on a regular basis.  Use non-skid mats or decals on the floor of the tub or shower.  Attach bath mats securely with double-sided, non-slip rug tape.  If you need to sit down while you are in the shower, use a plastic, non-slip stool.  Install grab bars by the toilet and in the tub and shower. Do not use towel bars as grab bars.      What can I do in the bedroom?  Make sure that a bedside light is easy to reach.  Do not use oversized bedding that drapes onto the floor.  Have a firm chair that has side arms to use for getting dressed. What can I do in the kitchen?  Clean up any spills right away.  If you need to reach for something above you, use a sturdy step stool that has a grab bar.  Keep electrical cables out of the way.  Do not use floor polish or wax that makes floors slippery. If you must use wax, make sure that it is non-skid floor wax. What can I do in the stairways?  Do not leave any items on the stairs.  Make sure that you have a light switch at the top of the stairs and the bottom of the stairs. Have them  installed if you do not have them.  Make sure that there are handrails on both sides of the stairs. Fix handrails that are broken or loose. Make sure that handrails are as long as the stairways.  Install non-slip stair treads on all stairs in your home.  Avoid having throw rugs at the top or bottom of stairways, or secure the rugs with carpet tape to prevent them from moving.  Choose a carpet design that does not hide the edge of steps on the stairway.  Check any carpeting to make sure that it is firmly attached to the stairs. Fix any carpet that is loose or worn. What can I do on the outside of my home?  Use bright outdoor lighting.  Regularly repair the edges of walkways and driveways and fix any cracks.  Remove high doorway thresholds.  Trim any shrubbery on the main path into your home.  Regularly check that handrails are securely fastened and  in good repair. Both sides of any steps should have handrails.  Install guardrails along the edges of any raised decks or porches.  Clear walkways of debris and clutter, including tools and rocks.  Have leaves, snow, and ice cleared regularly.  Use sand or salt on walkways during winter months.  In the garage, clean up any spills right away, including grease or oil spills. What other actions can I take?  Wear closed-toe shoes that fit well and support your feet. Wear shoes that have rubber soles or low heels.  Use mobility aids as needed, such as canes, walkers, scooters, and crutches.  Review your medicines with your health care provider. Some medicines can cause dizziness or changes in blood pressure, which increase your risk of falling. Talk with your health care provider about other ways that you can decrease your risk of falls. This may include working with a physical therapist or trainer to improve your strength, balance, and endurance. Where to find more information  Centers for Disease Control and Prevention, STEADI:  WebmailGuide.co.za  [employer] on Aging: BrainJudge.co.uk Contact a health care provider if:  You are afraid of falling at home.  You feel weak, drowsy, or dizzy at home.  You fall at home. Summary  There are many simple things that you can do to make your home safe and to help prevent falls.  Ways to make your home safe include removing tripping hazards and installing grab bars in the bathroom.  Ask for help when making these changes in your home. This information is not intended to replace advice given to you by your health care provider. Make sure you discuss any questions you have with your health care provider. Document Revised: 08/13/2017 Document Reviewed: 04/15/2017 Elsevier Patient Education  2021 [employer].   -

## 2021-02-19 ENCOUNTER — Ambulatory Visit: Payer: Medicare Other | Admitting: Internal Medicine

## 2021-02-21 ENCOUNTER — Encounter: Payer: Self-pay | Admitting: Internal Medicine

## 2021-02-21 ENCOUNTER — Other Ambulatory Visit: Payer: Self-pay

## 2021-02-21 ENCOUNTER — Ambulatory Visit (INDEPENDENT_AMBULATORY_CARE_PROVIDER_SITE_OTHER): Payer: Medicare Other | Admitting: Internal Medicine

## 2021-02-21 ENCOUNTER — Ambulatory Visit: Payer: Medicare Other | Attending: Internal Medicine

## 2021-02-21 VITALS — BP 164/88 | HR 95 | Temp 97.9°F | Resp 16 | Ht 67.0 in | Wt 191.4 lb

## 2021-02-21 DIAGNOSIS — M810 Age-related osteoporosis without current pathological fracture: Secondary | ICD-10-CM | POA: Diagnosis not present

## 2021-02-21 DIAGNOSIS — I10 Essential (primary) hypertension: Secondary | ICD-10-CM | POA: Diagnosis not present

## 2021-02-21 DIAGNOSIS — Z23 Encounter for immunization: Secondary | ICD-10-CM

## 2021-02-21 DIAGNOSIS — F0390 Unspecified dementia without behavioral disturbance: Secondary | ICD-10-CM

## 2021-02-21 DIAGNOSIS — M7989 Other specified soft tissue disorders: Secondary | ICD-10-CM

## 2021-02-21 NOTE — Progress Notes (Signed)
Subjective:    Patient ID: [name], female    DOB: Sep 20, 1935, 85 y.o.   MRN: [id number]  DOS:  02/21/2021 Type of visit - description: Routine visit  Since the last office visit is doing well. Did report a 2-week history of swelling at the left calf, some discomfort at the left popliteal area. Denies any injury. No recent prolonged car trips or airplane trip.  BPs noted to be slightly elevated, no ambulatory BPs recently.   Review of Systems Denies chest pain or difficulty breathing.  No palpitations  Past Medical History:  Diagnosis Date   Aortic stenosis    ECHO 5-20ll mild AS but no evidence of HCM or MR--no further testing suggested    Blood transfusion without reported diagnosis 1971   after hysterectomy   Cancer ([place]) 2019   squamous cell carcinoma- on chest   Depression    GERD (gastroesophageal reflux disease)    History of gastroesophageal reflux (GERD)    Hx of adenomatous colonic polyps    Hx of cardiac cath 2006   neg   Hypertension    Mixed urge and stress incontinence    OAB (overactive bladder) 07/13/2012   Osteoarthritis    Osteopenia    rx fosamax 08-2013   Personal history of colonic polyps - adenomas 04/19/2014    Past Surgical History:  Procedure Laterality Date   [place]   (at time of hyst?)   BLADDER SURGERY  2003   CATARACT EXTRACTION, BILATERAL     CHOLECYSTECTOMY     OOPHORECTOMY  1971   ROTATOR CUFF REPAIR Right    TOE SURGERY  1995   R 2nd --- correction of claw toe     Allergies as of 02/21/2021   No Known Allergies      Medication List        Accurate as of February 21, 2021  1:48 PM. If you have any questions, ask your nurse or doctor.          Cholecalciferol 25 MCG (1000 UT) capsule Take 1,000 Units by mouth daily.   Cinnamon 500 MG capsule Take 1,000 mg by mouth 2 (two) times daily.   GERITOL PO Take by mouth daily.   memantine 5 MG tablet Commonly known as:  NAMENDA Take 1 tablet (5 mg total) by mouth 2 (two) times daily.   NIFEdipine 60 MG 24 hr tablet Commonly known as: PROCARDIA XL/NIFEDICAL XL Take 1 tablet (60 mg total) by mouth daily.   OCUVITE PO Take 1 tablet by mouth daily.   omeprazole 20 MG capsule Commonly known as: PRILOSEC Take 1 capsule (20 mg total) by mouth 2 (two) times daily before a meal.   oxybutynin 5 MG tablet Commonly known as: DITROPAN Take 1 tablet (5 mg total) by mouth 3 (three) times daily.   simvastatin 20 MG tablet Commonly known as: ZOCOR Take 1 tablet (20 mg total) by mouth daily.   venlafaxine XR 75 MG 24 hr capsule Commonly known as: EFFEXOR-XR Take 1 capsule (75 mg total) by mouth daily with breakfast.   VITAMIN C PO Take by mouth daily.   vitamin E 600 UNIT capsule Take 600 Units by mouth daily.           Objective:   Physical Exam BP (!) 164/100 (BP Location: Left Arm, Patient Position: Sitting, Cuff Size: Small)   Pulse 95   Temp 97.9 F (36.6 C) (Oral)   Resp  16   Ht 5\' 7"  (1.702 m)   Wt 191 lb 6 oz (86.8 kg)   SpO2 96%   BMI 29.97 kg/m  General:   Well developed, NAD, BMI noted. HEENT:  Normocephalic . Face symmetric, atraumatic Lungs:  CTA B Normal respiratory effort, no intercostal retractions, no accessory muscle use. Heart: RRR, + systolic murmur, more noticeable at the R side of the sternum .  Lower extremities: Trace edema, left pretibial area, calves: Nontender, left side is slightly larger?.  Left popliteal area is a little puffy but not tender. Skin: Not pale. Not jaundice Neurologic:  alert & oriented X3.  Speech normal, gait appropriate for age and unassisted Psych--  Cognition and judgment appear intact.  Cooperative with normal attention span and concentration.  Behavior appropriate. No anxious or depressed appearing.      Assessment     Assessment HTN Hyperlipidemia  GERD Depression Dementia: DX 02/2020: MRI brain no acute, labs negative, Rx  Namenda Osteoporosis:  T score plus ankle fracture 03-2019: t score 2008 -1.8, 2011 -1.8, 2014 -2.1: Fosamax Rx 08-2013 t score 03-2016: -2.1.  10/2020 Tscore  -2.7: Switch from Fosamax to Prolia H/o vit d def  DJD Aortic stenosis ( at some point diagnosed with HOCM) ---echo 01-2010: "Mild AS but no evidence of HCM or MR.  ---Echo 6-15 mild AoS. Overactive bladder- pcp started to rx meds 09-2016  Ao [place]: wnl 02-2015  PLAN: HTN: BP elevated today, no recent ambulatory BPs, BP recheck: 164/88. Plan: Add losartan 50, continue Procardia, BMP 2 weeks.  Monitor BPs. Hyperlipidemia: Well-controlled, continue simvastatin. Left leg swelling: For 2 weeks, left leg is a slightly larger, puffy at the popliteal area, will get a [place] R/O DVT, however she possibly has a Baker's cyst. Dementia: Seems to be doing well.  No change Osteoporosis: Based on last T score of -2.7, she was switched to Prolia, first dose 11/2020 Social: Moved to a retirement community, independent living, has a Estate manager/land agent.  She is very satisfied.  Preventive care: Rec COVID-vaccine #4 RTC labs 2 weeks RTC follow-up 4 months  This visit occurred during the SARS-CoV-2 public health emergency.  Safety protocols were in place, including screening questions prior to the visit, additional usage of staff PPE, and extensive cleaning of exam room while observing appropriate contact time as indicated for disinfecting solutions.

## 2021-02-21 NOTE — Patient Instructions (Signed)
Please go to the first floor and get a ultrasound of your left leg  Proceed with shingles shot #4 at your convenience  Your blood pressure is elevated, we are adding losartan 50 mg 1 tablet every night.  Check the  blood pressure 2 or 3 times a week. BP GOAL is between 110/65 and  135/85. If it is consistently higher or lower, let me know   GO TO THE FRONT DESK, [place] back for blood work in 2 weeks  Come back for a checkup in 4 months

## 2021-02-21 NOTE — Progress Notes (Signed)
   Covid-19 Vaccination Clinic  Name:  CHANNING SAVICH    MRN: [id number] DOB: 02-03-1936  02/21/2021  Ms. Cervantez was observed post Covid-19 immunization for 15 minutes without incident. She was provided with Vaccine Information Sheet and instruction to access the V-Safe system.   Ms. Reber was instructed to call 911 with any severe reactions post vaccine: Difficulty breathing  Swelling of face and throat  A fast heartbeat  A bad rash all over body  Dizziness and weakness   Immunizations Administered     Name Date Dose VIS Date Route   PFIZER Comrnaty(Gray TOP) Covid-19 Vaccine 02/21/2021  2:32 PM 0.3 mL 08/22/2020 Intramuscular   Manufacturer: [place]   Lot: [id number]   [place]: [phone]

## 2021-02-22 ENCOUNTER — Ambulatory Visit (HOSPITAL_BASED_OUTPATIENT_CLINIC_OR_DEPARTMENT_OTHER)
Admission: RE | Admit: 2021-02-22 | Discharge: 2021-02-22 | Disposition: A | Discharge status: Home / Self Care | Payer: Medicare Other | Source: Ambulatory Visit | Attending: Internal Medicine | Admitting: Internal Medicine

## 2021-02-22 DIAGNOSIS — M7989 Other specified soft tissue disorders: Secondary | ICD-10-CM | POA: Diagnosis not present

## 2021-02-22 DIAGNOSIS — M25562 Pain in left knee: Secondary | ICD-10-CM | POA: Diagnosis not present

## 2021-02-23 DIAGNOSIS — F0390 Unspecified dementia without behavioral disturbance: Secondary | ICD-10-CM | POA: Insufficient documentation

## 2021-02-23 NOTE — Assessment & Plan Note (Signed)
HTN: BP elevated today, no recent ambulatory BPs, BP recheck: 164/88. Plan: Add losartan 50, continue Procardia, BMP 2 weeks.  Monitor BPs. Hyperlipidemia: Well-controlled, continue simvastatin. Left leg swelling: For 2 weeks, left leg is a slightly larger, puffy at the popliteal area, will get a [place] R/O DVT, however she possibly has a Baker's cyst. Dementia: Seems to be doing well.  No change Osteoporosis: Based on last T score of -2.7, she was switched to Prolia, first dose 11/2020 Social: Moved to a retirement community, independent living, has a Estate manager/land agent.  She is very satisfied.  Preventive care: Rec COVID-vaccine #4 RTC labs 2 weeks RTC follow-up 4 months

## 2021-02-25 ENCOUNTER — Other Ambulatory Visit (HOSPITAL_BASED_OUTPATIENT_CLINIC_OR_DEPARTMENT_OTHER): Payer: Self-pay

## 2021-02-25 MED ORDER — COVID-19 MRNA VAC-TRIS(PFIZER) 30 MCG/0.3ML IM SUSP
INTRAMUSCULAR | 0 refills | Status: DC
Start: 2021-02-21 — End: 2021-03-10
  Filled 2021-02-25: qty 0.3, 1d supply, fill #0

## 2021-03-07 ENCOUNTER — Other Ambulatory Visit: Payer: Self-pay

## 2021-03-07 ENCOUNTER — Other Ambulatory Visit (INDEPENDENT_AMBULATORY_CARE_PROVIDER_SITE_OTHER): Payer: Medicare Other

## 2021-03-07 ENCOUNTER — Emergency Department (HOSPITAL_BASED_OUTPATIENT_CLINIC_OR_DEPARTMENT_OTHER)
Admission: EM | Admit: 2021-03-07 | Discharge: 2021-03-08 | Disposition: A | Discharge status: Home / Self Care | Payer: Medicare Other | Attending: Emergency Medicine | Admitting: Emergency Medicine

## 2021-03-07 ENCOUNTER — Encounter (HOSPITAL_BASED_OUTPATIENT_CLINIC_OR_DEPARTMENT_OTHER): Payer: Self-pay

## 2021-03-07 DIAGNOSIS — R6883 Chills (without fever): Secondary | ICD-10-CM | POA: Diagnosis not present

## 2021-03-07 DIAGNOSIS — M545 Low back pain, unspecified: Secondary | ICD-10-CM | POA: Diagnosis not present

## 2021-03-07 DIAGNOSIS — R197 Diarrhea, unspecified: Secondary | ICD-10-CM | POA: Diagnosis not present

## 2021-03-07 DIAGNOSIS — Z853 Personal history of malignant neoplasm of breast: Secondary | ICD-10-CM | POA: Diagnosis not present

## 2021-03-07 DIAGNOSIS — F0390 Unspecified dementia without behavioral disturbance: Secondary | ICD-10-CM | POA: Diagnosis not present

## 2021-03-07 DIAGNOSIS — I10 Essential (primary) hypertension: Secondary | ICD-10-CM | POA: Diagnosis not present

## 2021-03-07 DIAGNOSIS — A09 Infectious gastroenteritis and colitis, unspecified: Secondary | ICD-10-CM | POA: Diagnosis not present

## 2021-03-07 LAB — BASIC METABOLIC PANEL
BUN: 22 mg/dL (ref 6–23)
CO2: 25 mEq/L (ref 19–32)
Calcium: 9.4 mg/dL (ref 8.4–10.5)
Chloride: 105 mEq/L (ref 96–112)
Creatinine, Ser: 1.1 mg/dL (ref 0.40–1.20)
GFR: 46.64 mL/min — ABNORMAL LOW (ref >60.00)
Glucose, Bld: 119 mg/dL — ABNORMAL HIGH (ref 70–99)
Potassium: 4 mEq/L (ref 3.5–5.1)
Sodium: 140 mEq/L (ref 135–145)

## 2021-03-07 NOTE — ED Triage Notes (Signed)
Patient here POV from [place] with Diarrhea and Chills.   Patient states symptoms began this AM. No recent Antibiotic Therapy per Patient.   Patient also complaining of Chronic Lower Back Pain. No CP, No SOB.  Ambulatory but Weak, GCS 15.

## 2021-03-08 LAB — CBC WITH DIFFERENTIAL/PLATELET
Abs Immature Granulocytes: 0.1 10*3/uL — ABNORMAL HIGH (ref 0.00–0.07)
Basophils Absolute: 0.1 10*3/uL (ref 0.0–0.1)
Basophils Relative: 1 %
Eosinophils Absolute: 0 10*3/uL (ref 0.0–0.5)
Eosinophils Relative: 0 %
HCT: 38.8 % (ref 36.0–46.0)
Hemoglobin: 12.8 g/dL (ref 12.0–15.0)
Immature Granulocytes: 1 %
Lymphocytes Relative: 5 %
Lymphs Abs: 0.7 10*3/uL (ref 0.7–4.0)
MCH: 30.3 pg (ref 26.0–34.0)
MCHC: 33 g/dL (ref 30.0–36.0)
MCV: 91.9 fL (ref 80.0–100.0)
Monocytes Absolute: 0.5 10*3/uL (ref 0.1–1.0)
Monocytes Relative: 4 %
Neutro Abs: 11.2 10*3/uL — ABNORMAL HIGH (ref 1.7–7.7)
Neutrophils Relative %: 89 %
Platelets: 213 10*3/uL (ref 150–400)
RBC: 4.22 MIL/uL (ref 3.87–5.11)
RDW: 12.6 % (ref 11.5–15.5)
WBC: 12.5 10*3/uL — ABNORMAL HIGH (ref 4.0–10.5)
nRBC: 0 % (ref 0.0–0.2)

## 2021-03-08 LAB — BASIC METABOLIC PANEL
Anion gap: 12 (ref 5–15)
BUN: 22 mg/dL (ref 8–23)
CO2: 20 mmol/L — ABNORMAL LOW (ref 22–32)
Calcium: 8.8 mg/dL — ABNORMAL LOW (ref 8.9–10.3)
Chloride: 107 mmol/L (ref 98–111)
Creatinine, Ser: 1.08 mg/dL — ABNORMAL HIGH (ref 0.44–1.00)
GFR, Estimated: 51 mL/min — ABNORMAL LOW (ref >60)
Glucose, Bld: 144 mg/dL — ABNORMAL HIGH (ref 70–99)
Potassium: 3.5 mmol/L (ref 3.5–5.1)
Sodium: 139 mmol/L (ref 135–145)

## 2021-03-08 MED ORDER — LOPERAMIDE HCL 2 MG PO CAPS
4.0000 mg | ORAL_CAPSULE | Freq: Once | ORAL | Status: AC
  Administered 2021-03-08: 01:00:00 4 mg via ORAL
  Filled 2021-03-08: qty 2

## 2021-03-08 MED ORDER — SODIUM CHLORIDE 0.9 % IV BOLUS
500.0000 mL | Freq: Once | INTRAVENOUS | Status: AC
  Administered 2021-03-08: 01:00:00 500 mL via INTRAVENOUS

## 2021-03-08 NOTE — ED Provider Notes (Signed)
[facility] EMERGENCY DEPT Provider Note   CSN: [id number] Arrival date & time: 03/07/21  2327     History Chief Complaint  Patient presents with   Diarrhea   Chills    [name] is a 85 y.o. female.  The history is provided by the patient.  Diarrhea She has history of hypertension, lipidemia, dementia and comes in because of diarrhea throughout the day.  She has had approximately 5 watery bowel movements.  There has been no blood or mucus in the stool.  She denies fever but has had some chills and sweats.  She denies nausea or vomiting.  She denies abdominal pain.  She has low back pain which is unchanged from baseline.  She denies any sick contacts.  There has been no treatment for her diarrhea.   Past Medical History:  Diagnosis Date   Aortic stenosis    ECHO 5-20ll mild AS but no evidence of HCM or MR--no further testing suggested    Blood transfusion without reported diagnosis 1971   after hysterectomy   Cancer ([place]) 2019   squamous cell carcinoma- on chest   Depression    GERD (gastroesophageal reflux disease)    History of gastroesophageal reflux (GERD)    Hx of adenomatous colonic polyps    Hx of cardiac cath 2006   neg   Hypertension    Mixed urge and stress incontinence    OAB (overactive bladder) 07/13/2012   Osteoarthritis    Osteopenia    rx fosamax 08-2013   Personal history of colonic polyps - adenomas 04/19/2014    Patient Active Problem List   Diagnosis Date Noted   Dementia ([place]) 02/23/2021   Closed fracture of left distal radius 07/31/2019   Closed nondisplaced fracture of lateral malleolus of right fibula 01/17/2019   PCP NOTES >>>>>>>>>>>>>>>> 09/03/2015   Personal history of colonic polyps - adenomas 04/19/2014   OAB (overactive bladder) 07/13/2012   Annual physical exam 12/17/2010   Aortic stenosis 01/01/2010   Hyperlipidemia 11/21/2008   GERD 11/28/2007   Depression 06/24/2007   Essential hypertension 06/24/2007    OSTEOARTHRITIS 06/24/2007   Osteoporosis 06/24/2007    Past Surgical History:  Procedure Laterality Date   [place]   (at time of hyst?)   BLADDER SURGERY  2003   CATARACT EXTRACTION, BILATERAL     CHOLECYSTECTOMY     OOPHORECTOMY  1971   ROTATOR CUFF REPAIR Right    TOE SURGERY  1995   R 2nd --- correction of claw toe      OB History   No obstetric history on file.     Family History  Problem Relation Age of Onset   Coronary artery disease Father 3       had CABG   Cancer Father        gallbladder   Stroke Mother 60   Ovarian cancer Sister 57   Colon cancer Neg Hx        NEGATIVE   Breast cancer Neg Hx        NEGATIVE    Social History   Tobacco Use   Smoking status: Never   Smokeless tobacco: Never  Vaping Use   Vaping Use: Never used  Substance Use Topics   Alcohol use: Yes    Alcohol/week: 0.0 standard drinks    Comment: wine rarely   Drug use: No    Home Medications Prior to Admission medications   Medication Sig  Start Date End Date Taking? Authorizing Provider  Ascorbic Acid (VITAMIN C PO) Take by mouth daily.    [provider]  Cholecalciferol 25 MCG (1000 UT) capsule Take 1,000 Units by mouth daily.    [provider]  Cinnamon 500 MG capsule Take 1,000 mg by mouth 2 (two) times daily.    [provider]  COVID-19 mRNA Vac-TriS, Pfizer, SUSP injection Inject into the muscle. 02/21/21   Carlyle Basques, MD  Iron-Vitamins (GERITOL PO) Take by mouth daily.    [provider]  memantine (NAMENDA) 5 MG tablet Take 1 tablet (5 mg total) by mouth 2 (two) times daily. 10/30/20   Colon Branch, MD  Multiple Vitamins-Minerals (OCUVITE PO) Take 1 tablet by mouth daily.    [provider]  NIFEdipine (PROCARDIA XL/NIFEDICAL XL) 60 MG 24 hr tablet Take 1 tablet (60 mg total) by mouth daily. 12/25/20   Colon Branch, MD  omeprazole (PRILOSEC) 20 MG capsule Take 1 capsule (20 mg  total) by mouth 2 (two) times daily before a meal. 10/30/20   Colon Branch, MD  oxybutynin (DITROPAN) 5 MG tablet Take 1 tablet (5 mg total) by mouth 3 (three) times daily. 10/21/20   Colon Branch, MD  simvastatin (ZOCOR) 20 MG tablet Take 1 tablet (20 mg total) by mouth daily. 12/25/20   Colon Branch, MD  venlafaxine XR (EFFEXOR-XR) 75 MG 24 hr capsule Take 1 capsule (75 mg total) by mouth daily with breakfast. 12/25/20   Colon Branch, MD  vitamin E 600 UNIT capsule Take 600 Units by mouth daily.    [provider]    Allergies    Patient has no known allergies.  Review of Systems   Review of Systems  Gastrointestinal:  Positive for diarrhea.  All other systems reviewed and are negative.  Physical Exam Updated Vital Signs BP 134/74 (BP Location: Right Arm)   Pulse 100   Temp 99 F (37.2 C) (Oral)   Resp 16   Ht 5\' 6"  (1.676 m)   Wt 86.6 kg   SpO2 94%   BMI 30.83 kg/m   Physical Exam Vitals and nursing note reviewed.  85 year old female, resting comfortably and in no acute distress. Vital signs are normal. Oxygen saturation is 94%, which is normal. Head is normocephalic and atraumatic. PERRLA, EOMI. Oropharynx is clear. Neck is nontender and supple without adenopathy or JVD. Back is nontender and there is no CVA tenderness. Lungs are clear without rales, wheezes, or rhonchi. Chest is nontender. Heart has regular rate and rhythm without murmur. Abdomen is soft, flat, nontender without masses or hepatosplenomegaly and peristalsis is hypoactive. Rectal: Normal sphincter tone, no impaction. Extremities have no cyanosis or edema, full range of motion is present. Skin is warm and dry without rash. Neurologic: Mental status is normal, cranial nerves are intact, there are no motor or sensory deficits.  ED Results / Procedures / Treatments   Labs (all labs ordered are listed, but only abnormal results are displayed) Labs Reviewed  BASIC METABOLIC PANEL - Abnormal; Notable for  the following components:      Result Value   CO2 20 (*)    Glucose, Bld 144 (*)    Creatinine, Ser 1.08 (*)    Calcium 8.8 (*)    GFR, Estimated 51 (*)    All other components within normal limits  CBC WITH DIFFERENTIAL/PLATELET - Abnormal; Notable for the following components:   WBC 12.5 (*)  Neutro Abs 11.2 (*)    Abs Immature Granulocytes 0.10 (*)    All other components within normal limits    Procedures Procedures   Medications Ordered in ED Medications  sodium chloride 0.9 % bolus 500 mL (0 mLs Intravenous Stopped 03/08/21 0142)  loperamide (IMODIUM) capsule 4 mg (4 mg Oral Given 03/08/21 0032)    ED Course  I have reviewed the triage vital signs and the nursing notes.  Pertinent labs & imaging results that were available during my care of the patient were reviewed by me and considered in my medical decision making (see chart for details).   MDM Rules/Calculators/A&P                         Diarrhea, possibly viral enteritis, possible food poisoning.  No red flags to suggest serious pathology.  Will check electrolytes, give IV fluids and a dose of oral loperamide.  Old records are reviewed, and she has no relevant past visits.  Labs are reassuring.  She has had no diarrhea while in the emergency department, but states that she really is not feeling any different following IV fluids and oral loperamide.  She is discharged with instructions to return for bloody diarrhea, fever, significant abdominal pain.  Continue taking oral loperamide as needed.  Final Clinical Impression(s) / ED Diagnoses Final diagnoses:  Diarrhea of presumed infectious origin    Rx / DC Orders ED Discharge Orders     None        Delora Fuel, MD 67/54/49 [phone]

## 2021-03-08 NOTE — Discharge Instructions (Addendum)
Drink plenty of fluids.  Take loperamide (Imodium A-D) as needed for diarrhea.  He may take loperamide as often as 4 times a day.  Return if diarrhea starts to become bloody, you start running a fever, or you start having significant abdominal pain.

## 2021-03-10 NOTE — Addendum Note (Signed)
Addended byDamita Dunnings D on: 03/10/2021 10:12 AM   Modules accepted: Orders

## 2021-04-02 ENCOUNTER — Telehealth: Payer: Self-pay | Admitting: Pharmacist

## 2021-04-02 NOTE — Chronic Care Management (AMB) (Signed)
    Chronic Care Management Pharmacy Assistant   Name: [name]  MRN: [id number] DOB: January 10, 1936  Reason for Encounter: General Adherence Call   Recent office visits:  02/21/21- Kelli November, MD- seen for hypertension, ultrasound of left leg ordered, encounter noted losartan 50 mg every night would be added, labs ordered, follow up 4 months   Recent consult visits:  No visits noted   Hospital visits:  Medication Reconciliation was completed by comparing discharge summary, patient's EMR and Pharmacy list, and upon discussion with patient.  Admitted to the hospital on 03/07/21 due to diarrhea. Discharge date was 03/08/21. Discharged from [facility] Emergency Department.    All medications remain the same after Hospital Discharge:??    Medications: Outpatient Encounter Medications as of 04/02/2021  Medication Sig   Ascorbic Acid (VITAMIN C PO) Take by mouth daily.   Cholecalciferol 25 MCG (1000 UT) capsule Take 1,000 Units by mouth daily.   Cinnamon 500 MG capsule Take 1,000 mg by mouth 2 (two) times daily.   Iron-Vitamins (GERITOL PO) Take by mouth daily.   memantine (NAMENDA) 5 MG tablet Take 1 tablet (5 mg total) by mouth 2 (two) times daily.   Multiple Vitamins-Minerals (OCUVITE PO) Take 1 tablet by mouth daily.   NIFEdipine (PROCARDIA XL/NIFEDICAL XL) 60 MG 24 hr tablet Take 1 tablet (60 mg total) by mouth daily.   omeprazole (PRILOSEC) 20 MG capsule Take 1 capsule (20 mg total) by mouth 2 (two) times daily before a meal.   oxybutynin (DITROPAN) 5 MG tablet Take 1 tablet (5 mg total) by mouth 3 (three) times daily.   simvastatin (ZOCOR) 20 MG tablet Take 1 tablet (20 mg total) by mouth daily.   venlafaxine XR (EFFEXOR-XR) 75 MG 24 hr capsule Take 1 capsule (75 mg total) by mouth daily with breakfast.   vitamin E 600 UNIT capsule Take 600 Units by mouth daily.   No facility-administered encounter medications on file as of 04/02/2021.   Patient stated she is doing very well. She  currently lives in a retirement home and expressed how much she loves it there. She is apart of many activities there and has been walking more which she is excited about. Her BP has been well controlled and monitored at the facility so she overall has no concerns or complaints.  Star Rating Drugs: Simvastatin 20 mg- 90 DS last filled 01/29/21  [name] CPA, CMA

## 2021-04-03 NOTE — Telephone Encounter (Signed)
Reviewed phone notes. It does look like Dr Larose Kells was planning to add losartan but no prescription. Her BP at ED was 134/74.  Will have patient record BP readings when checked at her assisted living facility and contact office is she has readings >140/90 or < 100/60.  She has phone follow up phone visit with clinical pharmacist in August and we can review BP readings then.

## 2021-04-04 NOTE — Progress Notes (Signed)
Left message to return call to discuss request made by CPP to record BP readings from assisted living facility.   [name] CPA, CMA

## 2021-04-29 ENCOUNTER — Encounter: Payer: Self-pay | Admitting: Internal Medicine

## 2021-04-29 ENCOUNTER — Ambulatory Visit (HOSPITAL_BASED_OUTPATIENT_CLINIC_OR_DEPARTMENT_OTHER)
Admission: RE | Admit: 2021-04-29 | Discharge: 2021-04-29 | Disposition: A | Discharge status: Home / Self Care | Payer: Medicare Other | Source: Ambulatory Visit | Attending: Internal Medicine | Admitting: Internal Medicine

## 2021-04-29 ENCOUNTER — Ambulatory Visit (INDEPENDENT_AMBULATORY_CARE_PROVIDER_SITE_OTHER): Payer: Medicare Other | Admitting: Internal Medicine

## 2021-04-29 ENCOUNTER — Other Ambulatory Visit: Payer: Self-pay

## 2021-04-29 VITALS — BP 142/80 | HR 97 | Temp 98.3°F | Resp 18 | Ht 67.0 in | Wt 192.5 lb

## 2021-04-29 DIAGNOSIS — S7001XA Contusion of right hip, initial encounter: Secondary | ICD-10-CM

## 2021-04-29 DIAGNOSIS — W19XXXA Unspecified fall, initial encounter: Secondary | ICD-10-CM | POA: Insufficient documentation

## 2021-04-29 DIAGNOSIS — R351 Nocturia: Secondary | ICD-10-CM

## 2021-04-29 DIAGNOSIS — M25551 Pain in right hip: Secondary | ICD-10-CM | POA: Insufficient documentation

## 2021-04-29 DIAGNOSIS — F0390 Unspecified dementia without behavioral disturbance: Secondary | ICD-10-CM

## 2021-04-29 MED ORDER — MIRABEGRON ER 25 MG PO TB24: 25.0000 mg | ORAL_TABLET | Freq: Every day | ORAL | 2 refills | Status: DC

## 2021-04-29 NOTE — Progress Notes (Signed)
Subjective:    Patient ID: [name], female    DOB: 1936-01-05, 85 y.o.   MRN: [id number]  DOS:  04/29/2021 Type of visit - description: Here with her daughter Lelon Frohlich  3 days ago, she was trying to get out of bed and she fell, landed on her buttocks, was unable to get herself up, spend the night on the floor, eventually was able to reach the telephone and call 911. Decided not to go to the ER.  Since then, is having pain at the low back and right hip.  Less pain at the left hip.  No LOC. No head or neck injury, currently with no headache or neck pain.  She has chronic nocturia, worse since the fall?  No bowel incontinence.   Review of Systems No fever chills No nausea or vomiting No dysuria, gross hematuria. No dizziness.   Past Medical History:  Diagnosis Date   Aortic stenosis    ECHO 5-20ll mild AS but no evidence of HCM or MR--no further testing suggested    Blood transfusion without reported diagnosis 1971   after hysterectomy   Cancer ([place]) 2019   squamous cell carcinoma- on chest   Depression    GERD (gastroesophageal reflux disease)    History of gastroesophageal reflux (GERD)    Hx of adenomatous colonic polyps    Hx of cardiac cath 2006   neg   Hypertension    Mixed urge and stress incontinence    OAB (overactive bladder) 07/13/2012   Osteoarthritis    Osteopenia    rx fosamax 08-2013   Personal history of colonic polyps - adenomas 04/19/2014    Past Surgical History:  Procedure Laterality Date   [place]   (at time of hyst?)   BLADDER SURGERY  2003   CATARACT EXTRACTION, BILATERAL     CHOLECYSTECTOMY     OOPHORECTOMY  1971   ROTATOR CUFF REPAIR Right    TOE SURGERY  1995   R 2nd --- correction of claw toe     Allergies as of 04/29/2021   No Known Allergies      Medication List        Accurate as of April 29, 2021 11:59 PM. If you have any questions, ask your nurse or doctor.          STOP  taking these medications    oxybutynin 5 MG tablet Commonly known as: DITROPAN Stopped by: [name], MD       TAKE these medications    Cholecalciferol 25 MCG (1000 UT) capsule Take 1,000 Units by mouth daily.   Cinnamon 500 MG capsule Take 1,000 mg by mouth 2 (two) times daily.   GERITOL PO Take by mouth daily.   memantine 5 MG tablet Commonly known as: NAMENDA Take 1 tablet (5 mg total) by mouth 2 (two) times daily.   mirabegron ER 25 MG Tb24 tablet Commonly known as: MYRBETRIQ Take 1 tablet (25 mg total) by mouth daily. Started by: [name], MD   NIFEdipine 60 MG 24 hr tablet Commonly known as: PROCARDIA XL/NIFEDICAL XL Take 1 tablet (60 mg total) by mouth daily.   OCUVITE PO Take 1 tablet by mouth daily.   omeprazole 20 MG capsule Commonly known as: PRILOSEC Take 1 capsule (20 mg total) by mouth 2 (two) times daily before a meal.   simvastatin 20 MG tablet Commonly known as: ZOCOR Take 1 tablet (20 mg total) by mouth  daily.   venlafaxine XR 75 MG 24 hr capsule Commonly known as: EFFEXOR-XR Take 1 capsule (75 mg total) by mouth daily with breakfast.   VITAMIN C PO Take by mouth daily.   vitamin E 600 UNIT capsule Take 600 Units by mouth daily.           Objective:   Physical Exam BP (!) 142/80 (BP Location: Left Arm, Patient Position: Sitting, Cuff Size: Small)   Pulse 97   Temp 98.3 F (36.8 C) (Oral)   Resp 18   Ht '5\' 7"'$  (1.702 m)   Wt 192 lb 8 oz (87.3 kg)   SpO2 96%   BMI 30.15 kg/m  General:   Well developed, NAD, BMI noted. HEENT:  Normocephalic . Face symmetric, atraumatic Lungs:  CTA B Normal respiratory effort, no intercostal retractions, no accessory muscle use. Heart: RRR, significant murmur noted Lower extremities: Wearing compression stockings, calves symmetric. Skin: Not pale. Not jaundice Neurologic:  alert & oriented X3.  Speech normal, gait assisted by a walker. She stood up holding onto the examining table, was  able to move her hips without problem. Mild + TTP at the lumbar spinal and R hip. Psych--  Cognition and judgment appear intact.  Cooperative with normal attention span and concentration.  Behavior appropriate. No anxious or depressed appearing.      Assessment      Assessment HTN Hyperlipidemia  GERD Depression Dementia: DX 02/2020: MRI brain no acute, labs negative, Rx Namenda Osteoporosis:  T score plus ankle fracture 03-2019: t score 2008 -1.8, 2011 -1.8, 2014 -2.1: Fosamax Rx 08-2013 t score 03-2016: -2.1.  10/2020 Tscore  -2.7: Switch from Fosamax to Prolia H/o vit d def  DJD Aortic stenosis ( at some point diagnosed with HOCM) ---echo 01-2010: "Mild AS but no evidence of HCM or MR.  ---Echo 6-15 mild AoS. Overactive bladder- pcp started to rx meds 09-2016  Ao [place]: wnl 02-2015  PLAN: Fall, initial encounter: Fall as described above,We discussed several issues including using a life alert -which is available to her- at all times. PT to prevent the next fall.  Consider bedside commode, hospital bed because she could adjust the height. X-ray right hip Hip contusion: See above OAB: Nocturia, worse since the fall (no lower extremity paresthesias, no bowel incontinence) ,no dysuria or gross hematuria.  Check UA urine culture. Will try to decrease nocturia which put her at risk of falls, urology note from 2017 recommended Myrbetriq, thus a prescription for that medication sent, hold oxybutynin for now Diarrhea: Seen at the ER 03/07/2021.  Possibly viral, labs were reassuring.  Time spent 32 minutes.  Extensive discussion about fall prevention including the use of the bedside commode, hospital bed, controlling nocturia.   This visit occurred during the SARS-CoV-2 public health emergency.  Safety protocols were in place, including screening questions prior to the visit, additional usage of staff PPE, and extensive cleaning of exam room while observing appropriate contact time as  indicated for disinfecting solutions.

## 2021-04-29 NOTE — Patient Instructions (Addendum)
   GO TO THE LAB : Provide a urine sample   STOP BY THE FIRST FLOOR:  get the XR     Use the life alert all the time  Start Myrbetriq 25 mg 1 tablet daily to help with the urination at night Hold oxybutynin call you try Myrbetriq. We are referring you to physical therapy, hopefully they will help [place] prevent the next fall

## 2021-04-30 LAB — URINALYSIS, ROUTINE W REFLEX MICROSCOPIC
Bilirubin Urine: NEGATIVE
Hgb urine dipstick: NEGATIVE
Nitrite: NEGATIVE
Specific Gravity, Urine: 1.015 (ref 1.000–1.030)
Urine Glucose: NEGATIVE
Urobilinogen, UA: 0.2 (ref 0.0–1.0)
pH: 5.5 (ref 5.0–8.0)

## 2021-04-30 NOTE — Assessment & Plan Note (Signed)
Fall, initial encounter: Fall as described above,We discussed several issues including using a life alert -which is available to her- at all times. PT to prevent the next fall.  Consider bedside commode, hospital bed because she could adjust the height. X-ray right hip Hip contusion: See above OAB: Nocturia, worse since the fall (no lower extremity paresthesias, no bowel incontinence) ,no dysuria or gross hematuria.  Check UA urine culture. Will try to decrease nocturia which put her at risk of falls, urology note from 2017 recommended Myrbetriq, thus a prescription for that medication sent, hold oxybutynin for now Diarrhea: Seen at the ER 03/07/2021.  Possibly viral, labs were reassuring.

## 2021-05-01 LAB — URINE CULTURE
MICRO NUMBER:: 12249322
SPECIMEN QUALITY:: ADEQUATE

## 2021-05-03 ENCOUNTER — Other Ambulatory Visit: Payer: Self-pay | Admitting: Internal Medicine

## 2021-05-03 MED ORDER — SULFAMETHOXAZOLE-TRIMETHOPRIM 800-160 MG PO TABS: 1.0000 | ORAL_TABLET | Freq: Two times a day (BID) | ORAL | 0 refills | Status: DC

## 2021-05-05 ENCOUNTER — Ambulatory Visit (INDEPENDENT_AMBULATORY_CARE_PROVIDER_SITE_OTHER): Payer: Medicare Other

## 2021-05-05 VITALS — Ht 67.0 in | Wt 192.0 lb

## 2021-05-05 DIAGNOSIS — Z0000 Encounter for general adult medical examination without abnormal findings: Secondary | ICD-10-CM

## 2021-05-05 NOTE — Progress Notes (Signed)
Subjective:   [name] is a 85 y.o. female who presents for Medicare Annual (Subsequent) preventive examination.  I connected with [name] today by telephone and verified that I am speaking with the correct person using two identifiers. Location patient: home Location provider: work Persons participating in the virtual visit: patient, Marine scientist.    I discussed the limitations, risks, security and privacy concerns of performing an evaluation and management service by telephone and the availability of in person appointments. I also discussed with the patient that there may be a patient responsible charge related to this service. The patient expressed understanding and verbally consented to this telephonic visit.    Interactive audio and video telecommunications were attempted between this provider and patient, however failed, due to patient having technical difficulties OR patient did not have access to video capability.  We continued and completed visit with audio only.  Some vital signs may be absent or patient reported.   Time Spent with patient on telephone encounter: 20 minutes   Review of Systems     Cardiac Risk Factors include: advanced age (>70mn, >>38women);dyslipidemia;sedentary lifestyle;obesity (BMI >30kg/m2)     Objective:    Today's Vitals   05/05/21 1103  Weight: 192 lb (87.1 kg)  Height: '5\' 7"'$  (1.702 m)  PainSc: 5    Body mass index is 30.07 kg/m.  Advanced Directives 05/05/2021 03/07/2021 08/11/2020 07/16/2020 05/02/2020 03/07/2019 04/05/2014  Does Patient Have a Medical Advance Directive? Yes No Yes Yes Yes Yes Patient has advance directive, copy not in chart  Type of Advance Directive Healthcare Power of ANoraLiving will Healthcare Power of Attorney Living will;Healthcare Power of Attorney  Does patient want to make changes to medical advance directive? - - No - Patient  declined - No - Patient declined No - Patient declined -  Copy of [place] Chart? - - No - copy requested - No - copy requested No - copy requested -  Would patient like information on creating a medical advance directive? - No - Patient declined - - - - -    Current Medications (verified) Outpatient Encounter Medications as of 05/05/2021  Medication Sig   Ascorbic Acid (VITAMIN C PO) Take by mouth daily.   Cholecalciferol 25 MCG (1000 UT) capsule Take 1,000 Units by mouth daily.   Cinnamon 500 MG capsule Take 1,000 mg by mouth 2 (two) times daily.   Iron-Vitamins (GERITOL PO) Take by mouth daily.   memantine (NAMENDA) 5 MG tablet Take 1 tablet (5 mg total) by mouth 2 (two) times daily.   mirabegron ER (MYRBETRIQ) 25 MG TB24 tablet Take 1 tablet (25 mg total) by mouth daily.   Multiple Vitamins-Minerals (OCUVITE PO) Take 1 tablet by mouth daily.   NIFEdipine (PROCARDIA XL/NIFEDICAL XL) 60 MG 24 hr tablet Take 1 tablet (60 mg total) by mouth daily.   omeprazole (PRILOSEC) 20 MG capsule Take 1 capsule (20 mg total) by mouth 2 (two) times daily before a meal.   simvastatin (ZOCOR) 20 MG tablet Take 1 tablet (20 mg total) by mouth daily.   sulfamethoxazole-trimethoprim (BACTRIM DS) 800-160 MG tablet Take 1 tablet by mouth 2 (two) times daily.   venlafaxine XR (EFFEXOR-XR) 75 MG 24 hr capsule Take 1 capsule (75 mg total) by mouth daily with breakfast.   vitamin E 600 UNIT capsule Take 600 Units by mouth daily.   No facility-administered encounter medications on  file as of 05/05/2021.    Allergies (verified) Patient has no known allergies.   History: Past Medical History:  Diagnosis Date   Aortic stenosis    ECHO 5-20ll mild AS but no evidence of HCM or MR--no further testing suggested    Blood transfusion without reported diagnosis 1971   after hysterectomy   Cancer ([place]) 2019   squamous cell carcinoma- on chest   Depression    GERD (gastroesophageal reflux  disease)    History of gastroesophageal reflux (GERD)    Hx of adenomatous colonic polyps    Hx of cardiac cath 2006   neg   Hypertension    Mixed urge and stress incontinence    OAB (overactive bladder) 07/13/2012   Osteoarthritis    Osteopenia    rx fosamax 08-2013   Personal history of colonic polyps - adenomas 04/19/2014   Past Surgical History:  Procedure Laterality Date   [place]   (at time of hyst?)   BLADDER SURGERY  2003   CATARACT EXTRACTION, BILATERAL     CHOLECYSTECTOMY     OOPHORECTOMY  1971   ROTATOR CUFF REPAIR Right    TOE SURGERY  1995   R 2nd --- correction of claw toe    Family History  Problem Relation Age of Onset   Coronary artery disease Father 59       had CABG   Cancer Father        gallbladder   Stroke Mother 50   Ovarian cancer Sister 37   Colon cancer Neg Hx        NEGATIVE   Breast cancer Neg Hx        NEGATIVE   Social History   Socioeconomic History   Marital status: Widowed    Spouse name: Not on file   Number of children: 2   Years of education: Not on file   Highest education level: Not on file  Occupational History   Occupation: stays at home     Employer: RETIRED  Tobacco Use   Smoking status: Never   Smokeless tobacco: Never  Vaping Use   Vaping Use: Never used  Substance and Sexual Activity   Alcohol use: Not Currently    Comment: wine rarely   Drug use: No   Sexual activity: Not Currently  Other Topics Concern   Not on file  Social History Narrative   Lives by herself     Lost Husband 06/2019   Moved to a smaller appt 12/2019 (independent)   2 children, [name] (airforce), daughter [name] ([place] police), she is very supportive   Social Determinants of Radio broadcast assistant Strain: Low Risk    Difficulty of Paying Living Expenses: Not hard at all  Food Insecurity: No Food Insecurity   Worried About Charity fundraiser in the Last Year: Never true   Arboriculturist  in the Last Year: Never true  Transportation Needs: No Transportation Needs   Lack of Transportation (Medical): No   Lack of Transportation (Non-Medical): No  Physical Activity: Inactive   Days of Exercise per Week: 0 days   Minutes of Exercise per Session: 0 min  Stress: No Stress Concern Present   Feeling of Stress : Not at all  Social Connections: Moderately Integrated   Frequency of Communication with Friends and Family: More than three times a week   Frequency of Social Gatherings with Friends and Family: More than  three times a week   Attends Religious Services: More than 4 times per year   Active Member of Clubs or Organizations: Yes   Attends Archivist Meetings: More than 4 times per year   Marital Status: Widowed    Tobacco Counseling Counseling given: Not Answered   Clinical Intake:  Pre-visit preparation completed: Yes  Pain : 0-10 Pain Score: 5  Pain Type: Acute pain Pain Location: Leg Pain Orientation: Left, Right Pain Onset: In the past 7 days Pain Frequency: Constant     Nutritional Status: BMI > 30  Obese Nutritional Risks: None Diabetes: No  How often do you need to have someone help you when you read instructions, pamphlets, or other written materials from your doctor or pharmacy?: 1 - Never  Diabetic?No  Interpreter Needed?: No  Information entered by :: Caroleen Hamman LPN   Activities of Daily Living In your present state of health, do you have any difficulty performing the following activities: 05/05/2021  Hearing? N  Vision? N  Difficulty concentrating or making decisions? N  Walking or climbing stairs? N  Dressing or bathing? N  Doing errands, shopping? N  Preparing Food and eating ? N  Using the Toilet? N  In the past six months, have you accidently leaked urine? Y  Do you have problems with loss of bowel control? N  Managing your Medications? N  Managing your Finances? N  Housekeeping or managing your Housekeeping? N   Some recent data might be hidden    Patient Care Team: Colon Branch, MD as PCP - General Festus Aloe, MD as Consulting Physician (Urology) Gatha Mayer, MD as Consulting Physician (Gastroenterology) Cherre Robins, PharmD (Pharmacist)  Indicate any recent Medical Services you may have received from other than [facility] providers in the past year (date may be approximate).     Assessment:   This is a routine wellness examination for [place].  Hearing/Vision screen Hearing Screening - Comments:: No issues Vision Screening - Comments:: Last eye exam-2 years  Dietary issues and exercise activities discussed: Current Exercise Habits: The patient does not participate in regular exercise at present, Exercise limited by: None identified   Goals Addressed             This Visit's Progress    DIET - INCREASE WATER INTAKE   On track    Patient Stated       Increase activity       Depression Screen PHQ 2/9 Scores 05/05/2021 04/29/2021 02/21/2021 10/22/2020 07/31/2020 05/02/2020 02/26/2020  PHQ - 2 Score 0 0 0 5 1 0 3  PHQ- 9 Score - - 0 16 7 - 16    Fall Risk Fall Risk  05/05/2021 04/29/2021 02/21/2021 02/11/2021 02/11/2021  Falls in the past year? 1 1 0 1 0  Number falls in past yr: 1 0 0 - 0  Injury with Fall? 0 0 0 - -  Risk for fall due to : History of fall(s) - - History of fall(s) -  Follow up Falls prevention discussed Falls evaluation completed Falls evaluation completed Education provided -    FALL RISK PREVENTION PERTAINING TO THE HOME:  Any stairs in or around the home? Yes  If so, are there any without handrails? No  Home free of loose throw rugs in walkways, pet beds, electrical cords, etc? Yes  Adequate lighting in your home to reduce risk of falls? Yes   ASSISTIVE DEVICES UTILIZED TO PREVENT FALLS:  Life alert? No  Use  of a cane, walker or w/c? Yes  Grab bars in the bathroom? Yes  Shower chair or bench in shower? Yes  Elevated toilet seat or a handicapped  toilet? No   TIMED UP AND GO:  Was the test performed? No . Phone visit   Cognitive Function:Normal cognitive status assessed by this Nurse Health Advisor. No abnormalities found.   MMSE - Mini Mental State Exam 05/02/2020 02/26/2020  Not completed: Refused -  Orientation to time - 4  Orientation to Place - 5  Registration - 3  Attention/ Calculation - 3  Recall - 2  Language- name 2 objects - 2  Language- repeat - 1  Language- follow 3 step command - 3  Language- read & follow direction - 1  Write a sentence - 1  Copy design - 0  Total score - 25        Immunizations Immunization History  Administered Date(s) Administered   Fluad Quad(high Dose 65+) 06/01/2019   Influenza Split 07/15/2011, 06/01/2012   Influenza Whole 08/18/2007, 06/15/2008, 06/12/2009, 06/12/2010   Influenza, High Dose Seasonal PF 07/26/2018   Influenza,inj,Quad PF,6+ Mos 06/06/2013, 06/11/2014, 05/24/2015   Influenza-Unspecified 08/14/2016, 06/30/2020   PFIZER Comirnaty(Gray Top)Covid-19 Tri-Sucrose Vaccine 02/21/2021   PFIZER(Purple Top)SARS-COV-2 Vaccination 10/09/2019, 10/30/2019, 06/30/2020   Pneumococcal Conjugate-13 02/14/2014   Pneumococcal Polysaccharide-23 09/15/2003, 11/21/2008, 07/31/2020   Td 06/24/2007   Tdap 05/13/2019   Zoster Recombinat (Shingrix) 10/22/2020, 12/20/2020   Zoster, Live 08/17/2011    TDAP status: Up to date  Flu Vaccine status: Up to date  Pneumococcal vaccine status: Up to date  Covid-19 vaccine status: Completed vaccines  Qualifies for Shingles Vaccine? No   Zostavax completed Yes   Shingrix Completed?: Yes  Screening Tests Health Maintenance  Topic Date Due   INFLUENZA VACCINE  04/14/2021   TETANUS/TDAP  05/12/2029   DEXA SCAN  Completed   COVID-19 Vaccine  Completed   PNA vac Low Risk Adult  Completed   Zoster Vaccines- Shingrix  Completed   HPV VACCINES  Aged Out    Health Maintenance  Health Maintenance Due  Topic Date Due   INFLUENZA  VACCINE  04/14/2021    Colorectal cancer screening: No longer required.   Mammogram status: No longer required due to Patient declined.  Bone Density status: Completed 10/28/2020. Results reflect: Bone density results: OSTEOPOROSIS. Repeat every 2 years.  Lung Cancer Screening: (Low Dose CT Chest recommended if Age 34-80 years, 30 pack-year currently smoking OR have quit w/in 15years.) does not qualify.     Additional Screening:  Hepatitis C Screening: does not qualify  Vision Screening: Recommended annual ophthalmology exams for early detection of glaucoma and other disorders of the eye. Is the patient up to date with their annual eye exam?  No  Who is the provider or what is the name of the office in which the patient attends annual eye exams? Patient unsure of name Patient to make an appt soon  Dental Screening: Recommended annual dental exams for proper oral hygiene  Community Resource Referral / Chronic Care Management: CRR required this visit?  No   CCM required this visit?  No      Plan:     I have personally reviewed and noted the following in the patient's chart:   Medical and social history Use of alcohol, tobacco or illicit drugs  Current medications and supplements including opioid prescriptions.  Functional ability and status Nutritional status Physical activity Advanced directives List of other physicians Hospitalizations, surgeries, and ER  visits in previous 12 months Vitals Screenings to include cognitive, depression, and falls Referrals and appointments  In addition, I have reviewed and discussed with patient certain preventive protocols, quality metrics, and best practice recommendations. A written personalized care plan for preventive services as well as general preventive health recommendations were provided to patient.   Due to this being a telephonic visit, the after visit summary with patients personalized plan was offered to patient via mail  or my-chart. Patient declined at this time.  Marta Antu, LPN   [id number]  Nurse Health Advisor  Nurse Notes: None

## 2021-05-05 NOTE — Patient Instructions (Signed)
[name] , Thank you for taking time to complete your Medicare Wellness Visit. I appreciate your ongoing commitment to your health goals. Please review the following plan we discussed and let me know if I can assist you in the future.   Screening recommendations/referrals: Colonoscopy: No longer required Mammogram: Declined Bone Density: Completed 10/28/2020-Due 10/28/2022 Recommended yearly ophthalmology/optometry visit for glaucoma screening and checkup Recommended yearly dental visit for hygiene and checkup  Vaccinations: Influenza vaccine: Due 05/2023 Pneumococcal vaccine: Up to date Tdap vaccine: Up to date-Due-05/12/2029 Shingles vaccine: Completed vaccines   Covid-19:Up to date  Advanced directives: Please bring a copy for your chart  Conditions/risks identified: See problem list  Next appointment: Follow up in one year for your annual wellness visit    Preventive Care 41 Years and Older, Female Preventive care refers to lifestyle choices and visits with your health care provider that can promote health and wellness. What does preventive care include? A yearly physical exam. This is also called an annual well check. Dental exams once or twice a year. Routine eye exams. Ask your health care provider how often you should have your eyes checked. Personal lifestyle choices, including: Daily care of your teeth and gums. Regular physical activity. Eating a healthy diet. Avoiding tobacco and drug use. Limiting alcohol use. Practicing safe sex. Taking low-dose aspirin every day. Taking vitamin and mineral supplements as recommended by your health care provider. What happens during an annual well check? The services and screenings done by your health care provider during your annual well check will depend on your age, overall health, lifestyle risk factors, and family history of disease. Counseling  Your health care provider may ask you questions about your: Alcohol use. Tobacco  use. Drug use. Emotional well-being. Home and relationship well-being. Sexual activity. Eating habits. History of falls. Memory and ability to understand (cognition). Work and work Statistician. Reproductive health. Screening  You may have the following tests or measurements: Height, weight, and BMI. Blood pressure. Lipid and cholesterol levels. These may be checked every 5 years, or more frequently if you are over 53 years old. Skin check. Lung cancer screening. You may have this screening every year starting at age 78 if you have a 30-pack-year history of smoking and currently smoke or have quit within the past 15 years. Fecal occult blood test (FOBT) of the stool. You may have this test every year starting at age 19. Flexible sigmoidoscopy or colonoscopy. You may have a sigmoidoscopy every 5 years or a colonoscopy every 10 years starting at age 51. Hepatitis C blood test. Hepatitis B blood test. Sexually transmitted disease (STD) testing. Diabetes screening. This is done by checking your blood sugar (glucose) after you have not eaten for a while (fasting). You may have this done every 1-3 years. Bone density scan. This is done to screen for osteoporosis. You may have this done starting at age 16. Mammogram. This may be done every 1-2 years. Talk to your health care provider about how often you should have regular mammograms. Talk with your health care provider about your test results, treatment options, and if necessary, the need for more tests. Vaccines  Your health care provider may recommend certain vaccines, such as: Influenza vaccine. This is recommended every year. Tetanus, diphtheria, and acellular pertussis (Tdap, Td) vaccine. You may need a Td booster every 10 years. Zoster vaccine. You may need this after age 79. Pneumococcal 13-valent conjugate (PCV13) vaccine. One dose is recommended after age 7. Pneumococcal polysaccharide (PPSV23) vaccine.  One dose is recommended  after age 62. Talk to your health care provider about which screenings and vaccines you need and how often you need them. This information is not intended to replace advice given to you by your health care provider. Make sure you discuss any questions you have with your health care provider. Document Released: 09/27/2015 Document Revised: 05/20/2016 Document Reviewed: 07/02/2015 Elsevier Interactive Patient Education  2017 [place] Prevention in the Home Falls can cause injuries. They can happen to people of all ages. There are many things you can do to make your home safe and to help prevent falls. What can I do on the outside of my home? Regularly fix the edges of walkways and driveways and fix any cracks. Remove anything that might make you trip as you walk through a door, such as a raised step or threshold. Trim any bushes or trees on the path to your home. Use bright outdoor lighting. Clear any walking paths of anything that might make someone trip, such as rocks or tools. Regularly check to see if handrails are loose or broken. Make sure that both sides of any steps have handrails. Any raised decks and porches should have guardrails on the edges. Have any leaves, snow, or ice cleared regularly. Use sand or salt on walking paths during winter. Clean up any spills in your garage right away. This includes oil or grease spills. What can I do in the bathroom? Use night lights. Install grab bars by the toilet and in the tub and shower. Do not use towel bars as grab bars. Use non-skid mats or decals in the tub or shower. If you need to sit down in the shower, use a plastic, non-slip stool. Keep the floor dry. Clean up any water that spills on the floor as soon as it happens. Remove soap buildup in the tub or shower regularly. Attach bath mats securely with double-sided non-slip rug tape. Do not have throw rugs and other things on the floor that can make you trip. What can I do  in the bedroom? Use night lights. Make sure that you have a light by your bed that is easy to reach. Do not use any sheets or blankets that are too big for your bed. They should not hang down onto the floor. Have a firm chair that has side arms. You can use this for support while you get dressed. Do not have throw rugs and other things on the floor that can make you trip. What can I do in the kitchen? Clean up any spills right away. Avoid walking on wet floors. Keep items that you use a lot in easy-to-reach places. If you need to reach something above you, use a strong step stool that has a grab bar. Keep electrical cords out of the way. Do not use floor polish or wax that makes floors slippery. If you must use wax, use non-skid floor wax. Do not have throw rugs and other things on the floor that can make you trip. What can I do with my stairs? Do not leave any items on the stairs. Make sure that there are handrails on both sides of the stairs and use them. Fix handrails that are broken or loose. Make sure that handrails are as long as the stairways. Check any carpeting to make sure that it is firmly attached to the stairs. Fix any carpet that is loose or worn. Avoid having throw rugs at the top or bottom of  the stairs. If you do have throw rugs, attach them to the floor with carpet tape. Make sure that you have a light switch at the top of the stairs and the bottom of the stairs. If you do not have them, ask someone to add them for you. What else can I do to help prevent falls? Wear shoes that: Do not have high heels. Have rubber bottoms. Are comfortable and fit you well. Are closed at the toe. Do not wear sandals. If you use a stepladder: Make sure that it is fully opened. Do not climb a closed stepladder. Make sure that both sides of the stepladder are locked into place. Ask someone to hold it for you, if possible. Clearly mark and make sure that you can see: Any grab bars or  handrails. First and last steps. Where the edge of each step is. Use tools that help you move around (mobility aids) if they are needed. These include: Canes. Walkers. Scooters. Crutches. Turn on the lights when you go into a dark area. Replace any light bulbs as soon as they burn out. Set up your furniture so you have a clear path. Avoid moving your furniture around. If any of your floors are uneven, fix them. If there are any pets around you, be aware of where they are. Review your medicines with your doctor. Some medicines can make you feel dizzy. This can increase your chance of falling. Ask your doctor what other things that you can do to help prevent falls. This information is not intended to replace advice given to you by your health care provider. Make sure you discuss any questions you have with your health care provider. Document Released: 06/27/2009 Document Revised: 02/06/2016 Document Reviewed: 10/05/2014 Elsevier Interactive Patient Education  2017 [employer].

## 2021-05-12 ENCOUNTER — Telehealth: Payer: Self-pay | Admitting: Internal Medicine

## 2021-05-12 DIAGNOSIS — I10 Essential (primary) hypertension: Secondary | ICD-10-CM | POA: Diagnosis not present

## 2021-05-12 DIAGNOSIS — Z85828 Personal history of other malignant neoplasm of skin: Secondary | ICD-10-CM | POA: Diagnosis not present

## 2021-05-12 DIAGNOSIS — K219 Gastro-esophageal reflux disease without esophagitis: Secondary | ICD-10-CM | POA: Diagnosis not present

## 2021-05-12 DIAGNOSIS — E785 Hyperlipidemia, unspecified: Secondary | ICD-10-CM | POA: Diagnosis not present

## 2021-05-12 DIAGNOSIS — Z9049 Acquired absence of other specified parts of digestive tract: Secondary | ICD-10-CM | POA: Diagnosis not present

## 2021-05-12 DIAGNOSIS — M810 Age-related osteoporosis without current pathological fracture: Secondary | ICD-10-CM | POA: Diagnosis not present

## 2021-05-12 DIAGNOSIS — N3281 Overactive bladder: Secondary | ICD-10-CM | POA: Diagnosis not present

## 2021-05-12 DIAGNOSIS — Z9181 History of falling: Secondary | ICD-10-CM | POA: Diagnosis not present

## 2021-05-12 DIAGNOSIS — F32A Depression, unspecified: Secondary | ICD-10-CM | POA: Diagnosis not present

## 2021-05-12 DIAGNOSIS — Z86010 Personal history of colonic polyps: Secondary | ICD-10-CM | POA: Diagnosis not present

## 2021-05-12 DIAGNOSIS — Z9089 Acquired absence of other organs: Secondary | ICD-10-CM | POA: Diagnosis not present

## 2021-05-12 DIAGNOSIS — S7001XD Contusion of right hip, subsequent encounter: Secondary | ICD-10-CM | POA: Diagnosis not present

## 2021-05-12 DIAGNOSIS — I350 Nonrheumatic aortic (valve) stenosis: Secondary | ICD-10-CM | POA: Diagnosis not present

## 2021-05-12 DIAGNOSIS — M1990 Unspecified osteoarthritis, unspecified site: Secondary | ICD-10-CM | POA: Diagnosis not present

## 2021-05-12 NOTE — Telephone Encounter (Signed)
Dorian ( Physical therapist for [facility]) is calling to get verbal orders. He would like verbal orders for physical therapy  2 times per week for two weeks, and 1 time per week for 6 weeks. They are for strengthening, balance, and walking. He can be reached at [phone], and a voicemail can be left with the orders.

## 2021-05-12 NOTE — Telephone Encounter (Signed)
Spoke w/ Dorian- verbal orders given.

## 2021-05-14 ENCOUNTER — Telehealth: Payer: Self-pay

## 2021-05-14 NOTE — Telephone Encounter (Signed)
Noted, thank you

## 2021-05-14 NOTE — Telephone Encounter (Signed)
[facility] calling just to advise Dr. Larose Kells that start of care was 05/12/21. FYI

## 2021-05-15 ENCOUNTER — Ambulatory Visit (INDEPENDENT_AMBULATORY_CARE_PROVIDER_SITE_OTHER): Payer: Medicare Other | Admitting: Pharmacist

## 2021-05-15 DIAGNOSIS — I10 Essential (primary) hypertension: Secondary | ICD-10-CM | POA: Diagnosis not present

## 2021-05-15 DIAGNOSIS — E785 Hyperlipidemia, unspecified: Secondary | ICD-10-CM

## 2021-05-15 DIAGNOSIS — N3281 Overactive bladder: Secondary | ICD-10-CM | POA: Diagnosis not present

## 2021-05-15 DIAGNOSIS — R351 Nocturia: Secondary | ICD-10-CM

## 2021-05-15 DIAGNOSIS — M810 Age-related osteoporosis without current pathological fracture: Secondary | ICD-10-CM | POA: Diagnosis not present

## 2021-05-15 DIAGNOSIS — F0390 Unspecified dementia without behavioral disturbance: Secondary | ICD-10-CM

## 2021-05-15 DIAGNOSIS — K219 Gastro-esophageal reflux disease without esophagitis: Secondary | ICD-10-CM

## 2021-05-15 DIAGNOSIS — Z9049 Acquired absence of other specified parts of digestive tract: Secondary | ICD-10-CM | POA: Diagnosis not present

## 2021-05-15 DIAGNOSIS — S7001XD Contusion of right hip, subsequent encounter: Secondary | ICD-10-CM | POA: Diagnosis not present

## 2021-05-15 DIAGNOSIS — I350 Nonrheumatic aortic (valve) stenosis: Secondary | ICD-10-CM | POA: Diagnosis not present

## 2021-05-15 DIAGNOSIS — M1990 Unspecified osteoarthritis, unspecified site: Secondary | ICD-10-CM | POA: Diagnosis not present

## 2021-05-15 DIAGNOSIS — Z85828 Personal history of other malignant neoplasm of skin: Secondary | ICD-10-CM | POA: Diagnosis not present

## 2021-05-15 DIAGNOSIS — Z86010 Personal history of colonic polyps: Secondary | ICD-10-CM | POA: Diagnosis not present

## 2021-05-15 DIAGNOSIS — Z9089 Acquired absence of other organs: Secondary | ICD-10-CM | POA: Diagnosis not present

## 2021-05-15 DIAGNOSIS — Z9181 History of falling: Secondary | ICD-10-CM | POA: Diagnosis not present

## 2021-05-15 DIAGNOSIS — F32A Depression, unspecified: Secondary | ICD-10-CM | POA: Diagnosis not present

## 2021-05-15 NOTE — Chronic Care Management (AMB) (Signed)
Chronic Care Management Pharmacy Note  05/15/2021 Name:  [name] MRN:  [id number] DOB:  1936-05-25  Summary: Recent fall but no fractures. Due to have next Prolia around 05/17/21 Myrbetriq 51m is working well; patient is tolerating.  Recommendations/Changes made from today's visit: Sent message to CMA to schedule Prolia injection Continue Myrbetriq - notify clinical pharmacist if any issues with cost  Consider rechecking B12 with next labs due to history of low B12 and dementia   Subjective: Kelli YAMAGUCHIis an 85y.o. year old female who is a primary patient of Paz, JAlda Berthold MD.  The CCM team was consulted for assistance with disease management and care coordination needs.    Engaged with patient by telephone for follow up visit in response to provider referral for pharmacy case management and/or care coordination services.   Consent to Services:  The patient was given information about Chronic Care Management services, agreed to services, and gave verbal consent prior to initiation of services.  Please see initial visit note for detailed documentation.   Patient Care Team: PColon Branch MD as PCP - General EFestus Aloe MD as Consulting Physician (Urology) GGatha Mayer MD as Consulting Physician (Gastroenterology) ECherre Robins PharmD (Pharmacist)  Recent office visits: 04/29/2021 - PCP (Dr PLarose Kells Fall from bed 3 days ago. Called 911 but decided not to go to ER. Pain in low back and right hip. Discontinued oxybutynin. started Myrbetriq for OAB /nocturia.  10/22/2020 - PCP (Dr [place] Elevated PHQ9. BP was 148/87; No medication changes. Cont to check BP at home.   Recent consult visits: No recent visits  Hospital visits: None in the last 6 months  Objective:  Lab Results  Component Value Date   CREATININE 1.08 (H) 03/08/2021   CREATININE 1.10 03/07/2021   CREATININE 0.93 10/22/2020    Lab Results  Component Value Date   HGBA1C 5.7 07/31/2020   Last  diabetic Eye exam: No results found for: HMDIABEYEEXA  Last diabetic Foot exam: No results found for: HMDIABFOOTEX      Component Value Date/Time   CHOL 164 07/31/2020 1354   TRIG 122.0 07/31/2020 1354   HDL 59.50 07/31/2020 1354   CHOLHDL 3 07/31/2020 1354   VLDL 24.4 07/31/2020 1354   LDLCALC 80 07/31/2020 1354   LDLDIRECT 163.4 12/13/2009 0000    Hepatic Function Latest Ref Rng & Units 02/26/2020 05/16/2019 03/16/2017  Total Protein 6.0 - 8.3 g/dL 6.9 6.8 7.2  Albumin 3.5 - 5.2 g/dL 4.7 4.4 4.3  AST 0 - 37 U/L 18 14 16   ALT 0 - 35 U/L 20 15 17   Alk Phosphatase 39 - 117 U/L 41 59 61  Total Bilirubin 0.2 - 1.2 mg/dL 0.3 0.4 0.3  Bilirubin, Direct 0.0 - 0.3 mg/dL - - -    Lab Results  Component Value Date/Time   TSH 3.14 10/22/2020 10:30 AM   TSH 3.14 02/26/2020 02:54 PM   FREET4 0.80 10/22/2020 10:30 AM    CBC Latest Ref Rng & Units 03/08/2021 07/16/2020 02/26/2020  WBC 4.0 - 10.5 K/uL 12.5(H) 7.3 6.8  Hemoglobin 12.0 - 15.0 g/dL 12.8 12.5 12.5  Hematocrit 36.0 - 46.0 % 38.8 38.2 36.3  Platelets 150 - 400 K/uL 213 199 234.0    Lab Results  Component Value Date/Time   VD25OH 40 01/12/2012 09:18 AM   VD25OH 80 12/17/2010 09:54 AM    Clinical ASCVD: No  The ASCVD Risk score (Mikey BussingDC Jr., et al., 2013) failed  to calculate for the following reasons:   The 2013 ASCVD risk score is only valid for ages 29 to 63     Social History   Tobacco Use  Smoking Status Never  Smokeless Tobacco Never   BP Readings from Last 3 Encounters:  04/29/21 (!) 142/80  03/08/21 128/67  02/21/21 (!) 164/88   Pulse Readings from Last 3 Encounters:  04/29/21 97  03/08/21 93  02/21/21 95   Wt Readings from Last 3 Encounters:  05/05/21 192 lb (87.1 kg)  04/29/21 192 lb 8 oz (87.3 kg)  03/07/21 191 lb (86.6 kg)    Assessment: Review of patient past medical history, allergies, medications, health status, including review of consultants reports, laboratory and other test data, was  performed as part of comprehensive evaluation and provision of chronic care management services.   SDOH:  (Social Determinants of Health) assessments and interventions performed:  SDOH Interventions    Flowsheet Row Most Recent Value  SDOH Interventions   Financial Strain Interventions Intervention Not Indicated       CCM Care Plan  No Known Allergies  Medications Reviewed Today     Reviewed by Cherre Robins, PharmD (Pharmacist) on 05/15/21 at 83  Med List Status: <None>   Medication Order Taking? Sig Documenting Provider Last Dose Status Informant  Ascorbic Acid (VITAMIN C PO) [id number] Yes Take by mouth daily. [provider] Taking Active Self  Cholecalciferol 25 MCG (1000 UT) capsule [id number] Yes Take 1,000 Units by mouth daily. [provider] Taking Active Self  Cinnamon 500 MG capsule [id number] Yes Take 1,000 mg by mouth 2 (two) times daily. [provider] Taking Active   Iron-Vitamins (GERITOL PO) [id number] Yes Take by mouth daily. [provider] Taking Active Self  memantine (NAMENDA) 5 MG tablet [id number] Yes Take 1 tablet (5 mg total) by mouth 2 (two) times daily. Colon Branch, MD Taking Active   mirabegron ER (MYRBETRIQ) 25 MG TB24 tablet [id number] Yes Take 1 tablet (25 mg total) by mouth daily. Colon Branch, MD Taking Active   Multiple Vitamins-Minerals (OCUVITE PO) [id number] Yes Take 1 tablet by mouth daily. [provider] Taking Active Self  NIFEdipine (PROCARDIA XL/NIFEDICAL XL) 60 MG 24 hr tablet [id number] Yes Take 1 tablet (60 mg total) by mouth daily. Colon Branch, MD Taking Active   omeprazole (PRILOSEC) 20 MG capsule [id number] Yes Take 1 capsule (20 mg total) by mouth 2 (two) times daily before a meal. Colon Branch, MD Taking Active   simvastatin (ZOCOR) 20 MG tablet [id number] Yes Take 1 tablet (20 mg total) by mouth daily. Colon Branch, MD Taking Active   sulfamethoxazole-trimethoprim (BACTRIM DS) 800-160 MG tablet  [id number] Yes Take 1 tablet by mouth 2 (two) times daily. Colon Branch, MD Taking Active   venlafaxine XR (EFFEXOR-XR) 75 MG 24 hr capsule [id number] Yes Take 1 capsule (75 mg total) by mouth daily with breakfast. Colon Branch, MD Taking Active   vitamin E 600 UNIT capsule [id number] Yes Take 600 Units by mouth daily. [provider] Taking Active Self            Patient Active Problem List   Diagnosis Date Noted   Dementia ([place]) 02/23/2021   Closed fracture of left distal radius 07/31/2019   Closed nondisplaced fracture of lateral malleolus of right fibula 01/17/2019   PCP NOTES >>>>>>>>>>>>>>>> 09/03/2015   Personal history of colonic polyps - adenomas 04/19/2014   OAB (overactive bladder) 07/13/2012  Annual physical exam 12/17/2010   Aortic stenosis 01/01/2010   Hyperlipidemia 11/21/2008   GERD 11/28/2007   Depression 06/24/2007   Essential hypertension 06/24/2007   OSTEOARTHRITIS 06/24/2007   Osteoporosis 06/24/2007    Immunization History  Administered Date(s) Administered   Fluad Quad(high Dose 65+) 06/01/2019   Influenza Split 07/15/2011, 06/01/2012   Influenza Whole 08/18/2007, 06/15/2008, 06/12/2009, 06/12/2010   Influenza, High Dose Seasonal PF 07/26/2018   Influenza,inj,Quad PF,6+ Mos 06/06/2013, 06/11/2014, 05/24/2015   Influenza-Unspecified 08/14/2016, 06/30/2020   PFIZER Comirnaty(Gray Top)Covid-19 Tri-Sucrose Vaccine 02/21/2021   PFIZER(Purple Top)SARS-COV-2 Vaccination 10/09/2019, 10/30/2019, 06/30/2020   Pneumococcal Conjugate-13 02/14/2014   Pneumococcal Polysaccharide-23 09/15/2003, 11/21/2008, 07/31/2020   Td 06/24/2007   Tdap 05/13/2019   Zoster Recombinat (Shingrix) 10/22/2020, 12/20/2020   Zoster, Live 08/17/2011    Conditions to be addressed/monitored: HTN, HLD, Dementia and low B12; osteoporosis; GERD; depression; OAB  Care Plan : General Pharmacy (Adult)  Updates made by Cherre Robins, PHARMD since 05/15/2021 12:00 AM     Problem: HTN,  hld, gerd, osteoporosis   Priority: High  Onset Date: 11/08/2020     Long-Range Goal: Patient-Specific Goal   Start Date: 11/08/2020  Expected End Date: 05/08/2021  Recent Progress: On track  Priority: High  Note:   Current Barriers:  Unable to maintain normal bone density.  High fall risk due to history of falls Lack of home monitoring of BP.  Pharmacist Clinical Goal(s):  Over the next 90 days, patient will achieve adherence to monitoring guidelines and medication adherence to achieve therapeutic efficacy maintain control of blood pressure as evidenced by home monitoring    Interventions: 1:1 collaboration with Colon Branch, MD regarding development and update of comprehensive plan of care as evidenced by provider attestation and co-signature Inter-disciplinary care team collaboration (see longitudinal plan of care) Comprehensive medication review performed; medication list updated in electronic medical record  Hypertension (BP goal <140/90) Last office BP slightly above goal Current treatment: Nifedipine 48m daily Medications previously tried: none noted  Current home readings: does not check, no logs available; Reports she does have BP checked at her independent living facility from time to time but does not record.  Denies hypotensive/hypertensive symptoms Interventions:  Educated on BP goals and benefits of medications for prevention of heart attack, stroke and kidney damage; Counseled to monitor BP at home or by nurse at independent living facility once a week document, and provide log at future appointments Recommended to continue current medication  Hyperlipidemia: (LDL goal < 100) Controlled Current treatment: Simvastatin 275mdaily Medications previously tried: none noted  Interventions:  Reviewed most recent lipid panel Educated on Cholesterol goals;  Discussed benefits of statin for ASCVD risk reduction; Recommended to continue current  medication  Osteoporosis Fractured left distal radius and right fibula Recent fall during night going to bathroom. Did not require hospital visit but patient was unable to get herself up and had to call 911.  Now has life alert that she wears daily.  Also using walker for ambulation and will start in home physical therapy today 05/15/21 Dr PaLarose Kellsad also recommended bedside commode but patient declined as her bathroom is only 10 feet from her bed.  He recommended hospital bed but she also declined this intervention.  Last DEXA Scan: 10/28/20   T score -2.7 R Femur neck Current treatment  Prolia 60 mg subcutaneously once every 6 months Vitamin D 1000 units daily Medications previously tried: alendronate - stopped in March 2022 Patient's daughter said her mother mentioned that  she was to take pills for her bones however change from alendronate to Prolia was made 11/2020 after last DEXA.  Interventions:  Verified with [facility] that alendronate had not been filled since 02/08/2021 and that Rx was cancelled.  Sent message to [employer], CMA in our office that coordinates Prolia administration requesting appointment for Prolia injection - last administration was 11/14/2020 - next due around 05/17/2021  Continue to take vitamin D daily Discussed getting 1253m calcium from diet, MVI and supplement.  Encouraged weight bearing exercise as able.  Fall prevention discussed  Over Active Bladder:  Controlled Current therapy:  Myrbetriq 23mdaily Previous medications tried: oxybutynin - not effective Reports fewer trips to bathroom during night since switched from oxybutynin to MyGeneral Electrichat cost of Myrbetriq is currently affordable at $44/month Interventions:  Continue Myrbetriq Advised to notify clinical pharmacist if cost of Myrbetriq increases to assess for possibility of need for PAP  GERD (Goal: Minimize symptoms).  Controlled Patient denies breakthrough reflux symptoms Current  treatment  Omeprazole 2028mwice daily Medications previously tried: none noted Intervention:  Recommended to continue current medication  Dementia:  Goal - slow progression of cognitive decline Patient has stopped driving She is in an independent living facility and they provide transportation to appointments and shopping. Current therapy:  Memantine 5mg23mice a day Vitamin E 600 IU daily  Patient has history of low B12; Last level was 382 on 02/26/2020. She reports she is no longer taking B Complex supplement.  Interventions:  Consider checking B12 at next visit and supplement if needed Encouraged her to restart vitamin E as some studies show benefit with slowing cognitive decline.  Restart exercise - goal is to work up to 150 minutes a week as able.  Patient Goals/Self-Care Activities Over the next 90 days, patient will:  Take medications as prescribed check blood pressure periodically, document, and provide at future appointments Restart exercise - working up as able to goal of 150 minutes per week.  Follow Up Plan: The care management team will reach out to the patient again over the next 90 days.        Medication Assistance: None required.  Patient affirms current coverage meets needs.  Patient's preferred pharmacy is:  [facility] [facility]: [phone]: [phone] -[place] [place]: [phone]: [phone] Up:  Patient agrees to Care Plan and Follow-up.  Plan: Telephone follow up appointment with care management team member scheduled for:  3 to 6 months  TammCherre RobinsarmD Clinical Pharmacist [facility]-[phone]

## 2021-05-15 NOTE — Patient Instructions (Signed)
Visit Information  PATIENT GOALS:  Goals Addressed             This Visit's Progress    Chronic Care Management Pharmacy Care Plan   On track    CARE PLAN ENTRY (see longitudinal plan of care for additional care plan information)  Current Barriers:  Chronic Disease Management support, education, and care coordination needs related to Hypertension, Hyperlipidemia, Osteopenia, Depression, Dementia, GERD, Overactive Bladder   Hypertension BP Readings from Last 3 Encounters:  04/29/21 (!) 142/80  03/08/21 128/67  02/21/21 (!) 164/88  Pharmacist Clinical Goal(s): Over the next 90 days, patient will work with PharmD and providers to achieve BP goal <140/90 Current regimen:  Nifedipine '60mg'$  daily Patient self care activities - Over the next 90 days, patient will: Maintain hypertension medication regimen.  Monitor blood pressure at home or by nurse at independent living facility once a week document, and provide log at future appointments  Hyperlipidemia Lab Results  Component Value Date/Time   LDLCALC 80 07/31/2020 01:54 PM   LDLDIRECT 163.4 12/13/2009 12:00 AM  Pharmacist Clinical Goal(s): Over the next 90 days, patient will work with PharmD and providers to maintain LDL goal < 100 Current regimen:  Simvastatin '20mg'$  daily Patient self care activities - Over the next 90 days, patient will: Maintain cholesterol medication regimen.  Increase exercise as able - try to take advantage of classes at your new independent living facility.  Goal is to work up to 150 minutes of physical activity per week.   Overactive Bladder Pharmacist Clinical Goal(s) Over the next 90 days, patient will work with PharmD and providers to reduce symptoms associated with overactive bladder Current regimen:  Myrbetriq '25mg'$  daily Interventions: Discussed side effects to monitor  Patient self care activities - Over the next 90 days, patient will: Maintain overactive bladder medication  regimen  Osteopenia/Osteoporosis Pharmacist Clinical Goal(s) Over the next 90 days, patient will work with PharmD and providers to reduce symptoms associated with overactive bladder Current regimen:  Prolia '60mg'$  subcutaneously every 6 months Vitamin D 1000 units daily Interventions: Verified cancellation of alendronate Rx from mail order [place] message to [employer], CMA in our office that coordinates Prolia administration requesting appointment for Prolia injection - last administration was 11/14/2020 - next due around 05/17/2021  Patient self care activities - Over the next 90 days, patient will: Continue to take Prolia and vitamin D Make appointment to have Prolia administered Continue to take vitamin D daily Discussed getting '1200mg'$  calcium from diet, MVI and supplement.  Encouraged weight bearing exercise as able.  Fall prevention discussed - continue to use walker and do physical therapy to increase strength  Medication management Pharmacist Clinical Goal(s): Over the next 90 days, patient will work with PharmD and providers to maintain optimal medication adherence Current pharmacy: [facility] Interventions Comprehensive medication review performed. Reviewed recent fill history and assess for adherence issues Patient self care activities - Over the next 90 days, patient will: Focus on medication adherence by filling and taking medications appropriately  Take medications as prescribed Report any questions or concerns to PharmD and/or provider(s)  Please see past updates related to this goal by clicking on the "Past Updates" button in the selected goal          The patient verbalized understanding of instructions, educational materials, and care plan provided today and declined offer to receive copy of patient instructions, educational materials, and care plan.   Telephone follow up  appointment with care management team member scheduled  for: 3 to 6 months  Cherre Robins, PharmD Clinical Pharmacist [place] [facility]

## 2021-05-20 ENCOUNTER — Telehealth: Payer: Self-pay

## 2021-05-20 DIAGNOSIS — I350 Nonrheumatic aortic (valve) stenosis: Secondary | ICD-10-CM | POA: Diagnosis not present

## 2021-05-20 DIAGNOSIS — M810 Age-related osteoporosis without current pathological fracture: Secondary | ICD-10-CM | POA: Diagnosis not present

## 2021-05-20 DIAGNOSIS — Z86010 Personal history of colonic polyps: Secondary | ICD-10-CM | POA: Diagnosis not present

## 2021-05-20 DIAGNOSIS — E785 Hyperlipidemia, unspecified: Secondary | ICD-10-CM | POA: Diagnosis not present

## 2021-05-20 DIAGNOSIS — Z90710 Acquired absence of both cervix and uterus: Secondary | ICD-10-CM

## 2021-05-20 DIAGNOSIS — I10 Essential (primary) hypertension: Secondary | ICD-10-CM | POA: Diagnosis not present

## 2021-05-20 DIAGNOSIS — Z9181 History of falling: Secondary | ICD-10-CM | POA: Diagnosis not present

## 2021-05-20 DIAGNOSIS — N3946 Mixed incontinence: Secondary | ICD-10-CM

## 2021-05-20 DIAGNOSIS — Z9089 Acquired absence of other organs: Secondary | ICD-10-CM | POA: Diagnosis not present

## 2021-05-20 DIAGNOSIS — F32A Depression, unspecified: Secondary | ICD-10-CM | POA: Diagnosis not present

## 2021-05-20 DIAGNOSIS — Z85828 Personal history of other malignant neoplasm of skin: Secondary | ICD-10-CM | POA: Diagnosis not present

## 2021-05-20 DIAGNOSIS — Z90721 Acquired absence of ovaries, unilateral: Secondary | ICD-10-CM

## 2021-05-20 DIAGNOSIS — N3281 Overactive bladder: Secondary | ICD-10-CM | POA: Diagnosis not present

## 2021-05-20 DIAGNOSIS — K219 Gastro-esophageal reflux disease without esophagitis: Secondary | ICD-10-CM | POA: Diagnosis not present

## 2021-05-20 DIAGNOSIS — F0390 Unspecified dementia without behavioral disturbance: Secondary | ICD-10-CM | POA: Diagnosis not present

## 2021-05-20 DIAGNOSIS — Z9049 Acquired absence of other specified parts of digestive tract: Secondary | ICD-10-CM | POA: Diagnosis not present

## 2021-05-20 DIAGNOSIS — S7001XD Contusion of right hip, subsequent encounter: Secondary | ICD-10-CM | POA: Diagnosis not present

## 2021-05-20 DIAGNOSIS — M1990 Unspecified osteoarthritis, unspecified site: Secondary | ICD-10-CM | POA: Diagnosis not present

## 2021-05-20 NOTE — Telephone Encounter (Signed)
Plan of care signed and faxed back to [place] at [phone]. Form sent for scanning.

## 2021-05-21 ENCOUNTER — Telehealth: Payer: Self-pay

## 2021-05-21 NOTE — Telephone Encounter (Signed)
Pt called wanting to know if she can schedule her next Prolia shot. Pt states if it is to call her daughter, Lelon Frohlich [phone], to schedule.

## 2021-05-22 NOTE — Telephone Encounter (Signed)
LVM on daughter's phone to call back and schedule.

## 2021-05-22 NOTE — Telephone Encounter (Signed)
Ok to schedule prolia vaccine at [facility].

## 2021-05-23 ENCOUNTER — Telehealth: Payer: Self-pay

## 2021-05-23 NOTE — Telephone Encounter (Signed)
-----   Message from Cherre Robins, PharmD sent at 05/15/2021 11:05 AM EDT ----- Regarding: Prolia Hi Zane Samson,   I have that Mrs. Rehagen is due to have Prolia soon. Her last injection was 11/14/2020. Do you have her on your list to schedule? I thought I had heard there were some issues with Prolia supply - is this still the case?  Tammy

## 2021-05-23 NOTE — Telephone Encounter (Signed)
-----   Message from Cherre Robins, PharmD sent at 05/15/2021 11:05 AM EDT ----- Regarding: Prolia Hi Finlay Godbee,   I have that Mrs. Kruer is due to have Prolia soon. Her last injection was 11/14/2020. Do you have her on your list to schedule? I thought I had heard there were some issues with Prolia supply - is this still the case?  Tammy

## 2021-05-29 ENCOUNTER — Other Ambulatory Visit: Payer: Self-pay

## 2021-05-29 ENCOUNTER — Ambulatory Visit (INDEPENDENT_AMBULATORY_CARE_PROVIDER_SITE_OTHER): Payer: Medicare Other

## 2021-05-29 DIAGNOSIS — M810 Age-related osteoporosis without current pathological fracture: Secondary | ICD-10-CM

## 2021-05-29 MED ORDER — DENOSUMAB 60 MG/ML ~~LOC~~ SOSY
60.0000 mg | PREFILLED_SYRINGE | Freq: Once | SUBCUTANEOUS | Status: AC
  Administered 2021-05-29: 60 mg via SUBCUTANEOUS

## 2021-05-29 NOTE — Progress Notes (Signed)
[name] is a 85 y.o. female presents to the office today for Prolia : subcutaneous injections, per physician's orders. Original order: Per Dr. Lucie Leather (med), 60 mg/ml (dose),  subcutaneous (route) was administered left arm (location) today. Patient tolerated injection. Patient next injection due: In 6 months, appt will be made when injection is due after checking current insurance benefits.   [name]

## 2021-05-30 DIAGNOSIS — K219 Gastro-esophageal reflux disease without esophagitis: Secondary | ICD-10-CM | POA: Diagnosis not present

## 2021-05-30 DIAGNOSIS — M1990 Unspecified osteoarthritis, unspecified site: Secondary | ICD-10-CM | POA: Diagnosis not present

## 2021-05-30 DIAGNOSIS — Z86010 Personal history of colonic polyps: Secondary | ICD-10-CM | POA: Diagnosis not present

## 2021-05-30 DIAGNOSIS — Z9181 History of falling: Secondary | ICD-10-CM | POA: Diagnosis not present

## 2021-05-30 DIAGNOSIS — M810 Age-related osteoporosis without current pathological fracture: Secondary | ICD-10-CM | POA: Diagnosis not present

## 2021-05-30 DIAGNOSIS — I350 Nonrheumatic aortic (valve) stenosis: Secondary | ICD-10-CM | POA: Diagnosis not present

## 2021-05-30 DIAGNOSIS — I10 Essential (primary) hypertension: Secondary | ICD-10-CM | POA: Diagnosis not present

## 2021-05-30 DIAGNOSIS — Z9049 Acquired absence of other specified parts of digestive tract: Secondary | ICD-10-CM | POA: Diagnosis not present

## 2021-05-30 DIAGNOSIS — E785 Hyperlipidemia, unspecified: Secondary | ICD-10-CM | POA: Diagnosis not present

## 2021-05-30 DIAGNOSIS — Z9089 Acquired absence of other organs: Secondary | ICD-10-CM | POA: Diagnosis not present

## 2021-05-30 DIAGNOSIS — S7001XD Contusion of right hip, subsequent encounter: Secondary | ICD-10-CM | POA: Diagnosis not present

## 2021-05-30 DIAGNOSIS — Z85828 Personal history of other malignant neoplasm of skin: Secondary | ICD-10-CM | POA: Diagnosis not present

## 2021-05-30 DIAGNOSIS — F32A Depression, unspecified: Secondary | ICD-10-CM | POA: Diagnosis not present

## 2021-05-30 DIAGNOSIS — N3281 Overactive bladder: Secondary | ICD-10-CM | POA: Diagnosis not present

## 2021-06-11 DIAGNOSIS — K219 Gastro-esophageal reflux disease without esophagitis: Secondary | ICD-10-CM | POA: Diagnosis not present

## 2021-06-11 DIAGNOSIS — F32A Depression, unspecified: Secondary | ICD-10-CM | POA: Diagnosis not present

## 2021-06-11 DIAGNOSIS — N3281 Overactive bladder: Secondary | ICD-10-CM | POA: Diagnosis not present

## 2021-06-11 DIAGNOSIS — Z9089 Acquired absence of other organs: Secondary | ICD-10-CM | POA: Diagnosis not present

## 2021-06-11 DIAGNOSIS — S7001XD Contusion of right hip, subsequent encounter: Secondary | ICD-10-CM | POA: Diagnosis not present

## 2021-06-11 DIAGNOSIS — E785 Hyperlipidemia, unspecified: Secondary | ICD-10-CM | POA: Diagnosis not present

## 2021-06-11 DIAGNOSIS — I350 Nonrheumatic aortic (valve) stenosis: Secondary | ICD-10-CM | POA: Diagnosis not present

## 2021-06-11 DIAGNOSIS — Z85828 Personal history of other malignant neoplasm of skin: Secondary | ICD-10-CM | POA: Diagnosis not present

## 2021-06-11 DIAGNOSIS — Z86010 Personal history of colonic polyps: Secondary | ICD-10-CM | POA: Diagnosis not present

## 2021-06-11 DIAGNOSIS — M810 Age-related osteoporosis without current pathological fracture: Secondary | ICD-10-CM | POA: Diagnosis not present

## 2021-06-11 DIAGNOSIS — M1990 Unspecified osteoarthritis, unspecified site: Secondary | ICD-10-CM | POA: Diagnosis not present

## 2021-06-11 DIAGNOSIS — Z9049 Acquired absence of other specified parts of digestive tract: Secondary | ICD-10-CM | POA: Diagnosis not present

## 2021-06-11 DIAGNOSIS — I10 Essential (primary) hypertension: Secondary | ICD-10-CM | POA: Diagnosis not present

## 2021-06-11 DIAGNOSIS — Z9181 History of falling: Secondary | ICD-10-CM | POA: Diagnosis not present

## 2021-06-23 ENCOUNTER — Ambulatory Visit: Payer: Medicare Other | Admitting: Internal Medicine

## 2021-06-24 ENCOUNTER — Other Ambulatory Visit: Payer: Self-pay

## 2021-06-24 ENCOUNTER — Ambulatory Visit (INDEPENDENT_AMBULATORY_CARE_PROVIDER_SITE_OTHER): Payer: Medicare Other | Admitting: Internal Medicine

## 2021-06-24 ENCOUNTER — Ambulatory Visit: Payer: Medicare Other | Attending: Internal Medicine

## 2021-06-24 ENCOUNTER — Encounter: Payer: Self-pay | Admitting: Internal Medicine

## 2021-06-24 VITALS — BP 144/80 | HR 85 | Temp 97.5°F | Resp 20 | Ht 67.0 in | Wt 192.5 lb

## 2021-06-24 DIAGNOSIS — I10 Essential (primary) hypertension: Secondary | ICD-10-CM | POA: Diagnosis not present

## 2021-06-24 DIAGNOSIS — E785 Hyperlipidemia, unspecified: Secondary | ICD-10-CM | POA: Diagnosis not present

## 2021-06-24 DIAGNOSIS — F0390 Unspecified dementia without behavioral disturbance: Secondary | ICD-10-CM | POA: Diagnosis not present

## 2021-06-24 DIAGNOSIS — R739 Hyperglycemia, unspecified: Secondary | ICD-10-CM

## 2021-06-24 DIAGNOSIS — Z23 Encounter for immunization: Secondary | ICD-10-CM

## 2021-06-24 NOTE — Progress Notes (Signed)
   Covid-19 Vaccination Clinic  Name:  [name]    MRN: [id number] DOB: 1936/04/26  06/24/2021  [name] was observed post Covid-19 immunization for 15 minutes without incident. She was provided with Vaccine Information Sheet and instruction to access the V-Safe system.   [name] was instructed to call 911 with any severe reactions post vaccine: Difficulty breathing  Swelling of face and throat  A fast heartbeat  A bad rash all over body  Dizziness and weakness

## 2021-06-24 NOTE — Patient Instructions (Addendum)
   GO TO THE LAB : Get the blood work     GO TO THE FRONT DESK, [place] Come back for a physical exam in 4 months

## 2021-06-24 NOTE — Assessment & Plan Note (Signed)
HTN: BP slightly elevated upon arrival, recheck 144/80.  Continue Procardia, check CMP. Hyperglycemia: Per chart review, check A1c Hyperlipidemia: On simvastatin, check FLP Depression: Symptoms well controlled.  Continue Effexor Dementia: Seems to be doing great.  Continue Namenda. Overactive bladder: On Myrbetriq, symptoms improved.  Fortunately, she is able to afford the medication. Fall prevention: Did physical therapy, no further falls Preventive care: Plans to get a flu shot at the place she lives, recommend a COVID booster.  She is receptive to advice. RTC 4 months CPX

## 2021-06-24 NOTE — Progress Notes (Signed)
Subjective:    Patient ID: [name], female    DOB: February 17, 1936, 85 y.o.   MRN: [id number]  DOS:  06/24/2021 Type of visit - description: Follow-up  Routine checkup. Since the last office visit is doing well. Today with talk about hypertension, high cholesterol, mild hyperglycemia.   Review of Systems See previous visit, hip contusion: Pain improved. No further falls LUTS under much better control  Past Medical History:  Diagnosis Date   Aortic stenosis    ECHO 5-20ll mild AS but no evidence of HCM or MR--no further testing suggested    Blood transfusion without reported diagnosis 1971   after hysterectomy   Cancer ([place]) 2019   squamous cell carcinoma- on chest   Depression    GERD (gastroesophageal reflux disease)    History of gastroesophageal reflux (GERD)    Hx of adenomatous colonic polyps    Hx of cardiac cath 2006   neg   Hypertension    Mixed urge and stress incontinence    OAB (overactive bladder) 07/13/2012   Osteoarthritis    Osteopenia    rx fosamax 08-2013   Personal history of colonic polyps - adenomas 04/19/2014    Past Surgical History:  Procedure Laterality Date   ABDOMINAL HYSTERECTOMY  1971   APPENDECTOMY  1971   (at time of hyst?)   BLADDER SURGERY  2003   CATARACT EXTRACTION, BILATERAL     CHOLECYSTECTOMY     OOPHORECTOMY  1971   ROTATOR CUFF REPAIR Right    TOE SURGERY  1995   R 2nd --- correction of claw toe     Allergies as of 06/24/2021   No Known Allergies      Medication List        Accurate as of June 24, 2021  8:04 PM. If you have any questions, ask your nurse or doctor.          Cholecalciferol 25 MCG (1000 UT) capsule Take 1,000 Units by mouth daily.   Cinnamon 500 MG capsule Take 1,000 mg by mouth 2 (two) times daily.   denosumab 60 MG/ML Sosy injection Commonly known as: PROLIA Inject 60 mg into the skin every 6 (six) months.   GERITOL PO Take by mouth daily.   memantine 5 MG tablet Commonly  known as: NAMENDA Take 1 tablet (5 mg total) by mouth 2 (two) times daily.   mirabegron ER 25 MG Tb24 tablet Commonly known as: MYRBETRIQ Take 1 tablet (25 mg total) by mouth daily.   NIFEdipine 60 MG 24 hr tablet Commonly known as: PROCARDIA XL/NIFEDICAL XL Take 1 tablet (60 mg total) by mouth daily.   OCUVITE PO Take 1 tablet by mouth daily.   omeprazole 20 MG capsule Commonly known as: PRILOSEC Take 1 capsule (20 mg total) by mouth 2 (two) times daily before a meal.   simvastatin 20 MG tablet Commonly known as: ZOCOR Take 1 tablet (20 mg total) by mouth daily.   sulfamethoxazole-trimethoprim 800-160 MG tablet Commonly known as: BACTRIM DS Take 1 tablet by mouth 2 (two) times daily.   venlafaxine XR 75 MG 24 hr capsule Commonly known as: EFFEXOR-XR Take 1 capsule (75 mg total) by mouth daily with breakfast.   VITAMIN C PO Take by mouth daily.   vitamin E 600 UNIT capsule Take 600 Units by mouth daily.           Objective:   Physical Exam BP (!) 144/80 (BP Location: Left Arm, Patient Position: Sitting, Cuff Size:  Small)   Pulse 85   Temp (!) 97.5 F (36.4 C) (Oral)   Resp 20   Ht 5\' 7"  (1.702 m)   Wt 192 lb 8 oz (87.3 kg)   SpO2 97%   BMI 30.15 kg/m  General:   Well developed, NAD, BMI noted. HEENT:  Normocephalic . Face symmetric, atraumatic Lungs:  CTA B Normal respiratory effort, no intercostal retractions, no accessory muscle use. Heart: RRR, + systolic murmur.  Lower extremities: no pretibial edema bilaterally  Skin: Not pale. Not jaundice Neurologic:  alert & oriented X3.  Speech normal, gait appropriate for age, assisted by a cane Psych--  Cognition and judgment appear intact.  Cooperative with normal attention span and concentration.  Behavior appropriate. No anxious or depressed appearing.      Assessment      Assessment HTN Hyperlipidemia  GERD Depression Dementia: DX 02/2020: MRI brain no acute, labs negative, Rx  Namenda Osteoporosis:  T score plus ankle fracture 03-2019: t score 2008 -1.8, 2011 -1.8, 2014 -2.1: Fosamax Rx 08-2013 t score 03-2016: -2.1.  10/2020 Tscore  -2.7: Switch from Fosamax to Prolia H/o vit d def  DJD Aortic stenosis ( at some point diagnosed with HOCM) ---echo 01-2010: "Mild AS but no evidence of HCM or MR.  ---Echo 6-15 , 05-2019 :stable mild AoS. Overactive bladder- pcp started to rx meds 09-2016  Ao [place]: wnl 02-2015  PLAN: HTN: BP slightly elevated upon arrival, recheck 144/80.  Continue Procardia, check CMP. Hyperglycemia: Per chart review, check A1c Hyperlipidemia: On simvastatin, check FLP Depression: Symptoms well controlled.  Continue Effexor Dementia: Seems to be doing great.  Continue Namenda. Overactive bladder: On Myrbetriq, symptoms improved.  Fortunately, she is able to afford the medication. Fall prevention: Did physical therapy, no further falls Preventive care: Plans to get a flu shot at the place she lives, recommend a COVID booster.  She is receptive to advice. RTC 4 months CPX   This visit occurred during the SARS-CoV-2 public health emergency.  Safety protocols were in place, including screening questions prior to the visit, additional usage of staff PPE, and extensive cleaning of exam room while observing appropriate contact time as indicated for disinfecting solutions.

## 2021-06-25 LAB — COMPREHENSIVE METABOLIC PANEL
ALT: 14 U/L (ref 0–35)
AST: 15 U/L (ref 0–37)
Albumin: 4.6 g/dL (ref 3.5–5.2)
Alkaline Phosphatase: 37 U/L — ABNORMAL LOW (ref 39–117)
BUN: 28 mg/dL — ABNORMAL HIGH (ref 6–23)
CO2: 27 mEq/L (ref 19–32)
Calcium: 9.7 mg/dL (ref 8.4–10.5)
Chloride: 103 mEq/L (ref 96–112)
Creatinine, Ser: 0.86 mg/dL (ref 0.40–1.20)
GFR: 62.53 mL/min (ref >60.00)
Glucose, Bld: 107 mg/dL — ABNORMAL HIGH (ref 70–99)
Potassium: 3.9 mEq/L (ref 3.5–5.1)
Sodium: 140 mEq/L (ref 135–145)
Total Bilirubin: 0.3 mg/dL (ref 0.2–1.2)
Total Protein: 6.9 g/dL (ref 6.0–8.3)

## 2021-06-25 LAB — LIPID PANEL
Cholesterol: 161 mg/dL (ref 0–200)
HDL: 56.3 mg/dL (ref >39.00)
NonHDL: 104.82
Total CHOL/HDL Ratio: 3
Triglycerides: 226 mg/dL — ABNORMAL HIGH (ref 0.0–149.0)
VLDL: 45.2 mg/dL — ABNORMAL HIGH (ref 0.0–40.0)

## 2021-06-25 LAB — HEMOGLOBIN A1C: Hgb A1c MFr Bld: 5.5 % (ref 4.6–6.5)

## 2021-06-25 LAB — LDL CHOLESTEROL, DIRECT: Direct LDL: 79 mg/dL

## 2021-07-26 ENCOUNTER — Other Ambulatory Visit: Payer: Self-pay | Admitting: Internal Medicine

## 2021-07-27 ENCOUNTER — Other Ambulatory Visit: Payer: Self-pay | Admitting: Internal Medicine

## 2021-09-02 ENCOUNTER — Other Ambulatory Visit: Payer: Self-pay | Admitting: Internal Medicine

## 2021-10-27 ENCOUNTER — Telehealth: Payer: Self-pay

## 2021-10-27 NOTE — Telephone Encounter (Signed)
Prolia VOB initiated via [email or web address]  Last OV:  Next OV:  Last Prolia inj: 05/29/21 Next Prolia inj DUE: 11/27/21

## 2021-11-04 ENCOUNTER — Encounter: Payer: Medicare Other | Admitting: Internal Medicine

## 2021-11-04 NOTE — Telephone Encounter (Signed)
Prior auth required for PROLIA  PA PROCESS DETAILS: Please complete the prior authorization form located at [email or web address] or call [phone]  

## 2021-11-17 NOTE — Telephone Encounter (Signed)
PA# [id number] ? ? ?

## 2021-11-19 NOTE — Telephone Encounter (Signed)
Pt ready for scheduling on or after 11/27/21 ? ?Out-of-pocket cost due at time of visit: $301 ? ?Primary: [employer] Medicare ?Prolia co-insurance: 20% (approximately $276) ?Admin fee co-insurance: 20% (approximately $25) ? ?Secondary: n/a ?Prolia co-insurance:  ?Admin fee co-insurance:  ? ?Deductible: does not apply ? ?Prior Auth: APPROVED ?PA# [id number] ?Valid: 11/17/21-11/18/22 ? ?** This summary of benefits is an estimation of the patient's out-of-pocket cost. Exact cost may vary based on individual plan coverage.  ? ?

## 2021-11-19 NOTE — Telephone Encounter (Signed)
Appt with PCP 12/11/21 ?

## 2021-12-02 NOTE — Telephone Encounter (Signed)
FYI: ? ?I gave the pts daughter these resources to help with her copay: mygooddays.org, healthwellfoundation.org, and panfoundation.org per [employer]'s request since she has Medicare.  ? ?She will call if these do not work and we need to try an alternate therapy.  ?

## 2021-12-11 ENCOUNTER — Ambulatory Visit (INDEPENDENT_AMBULATORY_CARE_PROVIDER_SITE_OTHER): Payer: Medicare Other | Admitting: Internal Medicine

## 2021-12-11 ENCOUNTER — Encounter: Payer: Self-pay | Admitting: Internal Medicine

## 2021-12-11 VITALS — BP 132/84 | HR 68 | Temp 97.7°F | Resp 18 | Ht 67.0 in | Wt 188.1 lb

## 2021-12-11 DIAGNOSIS — Z0000 Encounter for general adult medical examination without abnormal findings: Secondary | ICD-10-CM

## 2021-12-11 DIAGNOSIS — E559 Vitamin D deficiency, unspecified: Secondary | ICD-10-CM

## 2021-12-11 DIAGNOSIS — I10 Essential (primary) hypertension: Secondary | ICD-10-CM | POA: Diagnosis not present

## 2021-12-11 LAB — CBC WITH DIFFERENTIAL/PLATELET
Basophils Absolute: 0.1 10*3/uL (ref 0.0–0.1)
Basophils Relative: 0.7 % (ref 0.0–3.0)
Eosinophils Absolute: 0.2 10*3/uL (ref 0.0–0.7)
Eosinophils Relative: 1.6 % (ref 0.0–5.0)
HCT: 38 % (ref 36.0–46.0)
Hemoglobin: 12.6 g/dL (ref 12.0–15.0)
Lymphocytes Relative: 18.8 % (ref 12.0–46.0)
Lymphs Abs: 1.8 10*3/uL (ref 0.7–4.0)
MCHC: 33.2 g/dL (ref 30.0–36.0)
MCV: 93 fl (ref 78.0–100.0)
Monocytes Absolute: 1 10*3/uL (ref 0.1–1.0)
Monocytes Relative: 10.2 % (ref 3.0–12.0)
Neutro Abs: 6.6 10*3/uL (ref 1.4–7.7)
Neutrophils Relative %: 68.7 % (ref 43.0–77.0)
Platelets: 241 10*3/uL (ref 150.0–400.0)
RBC: 4.09 Mil/uL (ref 3.87–5.11)
RDW: 12.3 % (ref 11.5–15.5)
WBC: 9.6 10*3/uL (ref 4.0–10.5)

## 2021-12-11 LAB — COMPREHENSIVE METABOLIC PANEL
ALT: 11 U/L (ref 0–35)
AST: 12 U/L (ref 0–37)
Albumin: 4.8 g/dL (ref 3.5–5.2)
Alkaline Phosphatase: 48 U/L (ref 39–117)
BUN: 25 mg/dL — ABNORMAL HIGH (ref 6–23)
CO2: 27 mEq/L (ref 19–32)
Calcium: 9.6 mg/dL (ref 8.4–10.5)
Chloride: 103 mEq/L (ref 96–112)
Creatinine, Ser: 0.94 mg/dL (ref 0.40–1.20)
GFR: 56.01 mL/min — ABNORMAL LOW (ref >60.00)
Glucose, Bld: 91 mg/dL (ref 70–99)
Potassium: 3.9 mEq/L (ref 3.5–5.1)
Sodium: 141 mEq/L (ref 135–145)
Total Bilirubin: 0.4 mg/dL (ref 0.2–1.2)
Total Protein: 7.3 g/dL (ref 6.0–8.3)

## 2021-12-11 LAB — VITAMIN D 25 HYDROXY (VIT D DEFICIENCY, FRACTURES): VITD: 17.19 ng/mL — ABNORMAL LOW (ref 30.00–100.00)

## 2021-12-11 MED ORDER — NIFEDIPINE ER OSMOTIC RELEASE 60 MG PO TB24: 60.0000 mg | ORAL_TABLET | Freq: Every day | ORAL | 0 refills | Status: DC

## 2021-12-11 MED ORDER — OMEPRAZOLE 20 MG PO CPDR: 20.0000 mg | DELAYED_RELEASE_CAPSULE | Freq: Two times a day (BID) | ORAL | 0 refills | Status: DC

## 2021-12-11 MED ORDER — MIRABEGRON ER 25 MG PO TB24: 25.0000 mg | ORAL_TABLET | Freq: Every day | ORAL | 0 refills | Status: DC

## 2021-12-11 MED ORDER — VENLAFAXINE HCL ER 75 MG PO CP24: 75.0000 mg | ORAL_CAPSULE | Freq: Every day | ORAL | 0 refills | Status: DC

## 2021-12-11 MED ORDER — MEMANTINE HCL 5 MG PO TABS: 5.0000 mg | ORAL_TABLET | Freq: Two times a day (BID) | ORAL | 0 refills | Status: DC

## 2021-12-11 MED ORDER — SIMVASTATIN 20 MG PO TABS: 20.0000 mg | ORAL_TABLET | Freq: Every day | ORAL | 0 refills | Status: DC

## 2021-12-11 NOTE — Patient Instructions (Signed)
Check the  blood pressure regularly ?BP GOAL is between 110/65 and  135/85. ?If it is consistently higher or lower, let me know ? ?Recommend to bring a copy of your healthcare power of attorney ? ?GO TO THE LAB : Get the blood work   ? ? ?[place], [place] ?Come back for a checkup in 5 to 6 months ? ? ? ?Fall Prevention in the Home, Adult ?Falls can cause injuries and affect people of all ages. There are many simple things that you can do to make your home safe and to help prevent falls. Ask for help when making these changes, if needed. ?What actions can I take to prevent falls? ?General instructions ?Use good lighting in all rooms. Replace any light bulbs that burn out, turn on lights if it is dark, and use night-lights. ?Place frequently used items in easy-to-reach places. Lower the shelves around your home if necessary. ?Set up furniture so that there are clear paths around it. Avoid moving your furniture around. ?Remove throw rugs and other tripping hazards from the floor. ?Avoid walking on wet floors. ?Fix any uneven floor surfaces. ?Add color or contrast paint or tape to grab bars and handrails in your home. Place contrasting color strips on the first and last steps of staircases. ?When you use a stepladder, make sure that it is completely opened and that the sides and supports are firmly locked. Have someone hold the ladder while you are using it. Do not climb a closed stepladder. ?Know where your pets are when moving through your home. ?What can I do in the bathroom? ?  ?Keep the floor dry. Immediately clean up any water that is on the floor. ?Remove soap buildup in the tub or shower regularly. ?Use nonskid mats or decals on the floor of the tub or shower. ?Attach bath mats securely with double-sided, nonslip rug tape. ?If you need to sit down while you are in the shower, use a plastic, nonslip stool. ?Install grab bars by the toilet and in the tub and shower. Do not  use towel bars as grab bars. ?What can I do in the bedroom? ?Make sure that a bedside light is easy to reach. ?Do not use oversized bedding that reaches the floor. ?Have a firm chair that has side arms to use for getting dressed. ?What can I do in the kitchen? ?Clean up any spills right away. ?If you need to reach for something above you, use a sturdy step stool that has a grab bar. ?Keep electrical cables out of the way. ?Do not use floor polish or wax that makes floors slippery. If you must use wax, make sure that it is non-skid floor wax. ?What can I do with my stairs? ?Do not leave any items on the stairs. ?Make sure that you have a light switch at the top and the bottom of the stairs. Have them installed if you do not have them. ?Make sure that there are handrails on both sides of the stairs. Fix handrails that are broken or loose. Make sure that handrails are as long as the staircases. ?Install non-slip stair treads on all stairs in your home. ?Avoid having throw rugs at the top or bottom of stairs, or secure the rugs with carpet tape to prevent them from moving. ?Choose a carpet design that does not hide the edge of steps on the stairs. ?Check any carpeting to make sure that it is firmly attached to the stairs.  Fix any carpet that is loose or worn. ?What can I do on the outside of my home? ?Use bright outdoor lighting. ?Regularly repair the edges of walkways and driveways and fix any cracks. ?Remove high doorway thresholds. ?Trim any shrubbery on the main path into your home. ?Regularly check that handrails are securely fastened and in good repair. Both sides of all steps should have handrails. ?Install guardrails along the edges of any raised decks or porches. ?Clear walkways of debris and clutter, including tools and rocks. ?Have leaves, snow, and ice cleared regularly. ?Use sand or salt on walkways during winter months. ?In the garage, clean up any spills right away, including grease or oil spills. ?What  other actions can I take? ?Wear closed-toe shoes that fit well and support your feet. Wear shoes that have rubber soles or low heels. ?Use mobility aids as needed, such as canes, walkers, scooters, and crutches. ?Review your medicines with your health care provider. Some medicines can cause dizziness or changes in blood pressure, which increase your risk of falling. ?Talk with your health care provider about other ways that you can decrease your risk of falls. This may include working with a physical therapist or trainer to improve your strength, balance, and endurance. ?Where to find more information ?Centers for Disease Control and Prevention, STEADI: http://www.wolf.info/ ?[employer] on Aging: http://kim-miller.com/ ?Contact a health care provider if: ?You are afraid of falling at home. ?You feel weak, drowsy, or dizzy at home. ?You fall at home. ?Summary ?There are many simple things that you can do to make your home safe and to help prevent falls. ?Ways to make your home safe include removing tripping hazards and installing grab bars in the bathroom. ?Ask for help when making these changes in your home. ?This information is not intended to replace advice given to you by your health care provider. Make sure you discuss any questions you have with your health care provider. ?Document Revised: 04/03/2020 Document Reviewed: 04/03/2020 ?Elsevier Patient Education ? 2022 [place]. ? ? ?

## 2021-12-11 NOTE — Progress Notes (Signed)
? ?Subjective:  ? ? Patient ID: [name], female    DOB: Jun 23, 1936, 86 y.o.   MRN: [id number] ? ?DOS:  12/11/2021 ?Type of visit - description: cpx ? ?Here for CPX, no major concerns. ?Overactive bladder: Symptoms relatively well controlled at this point. ? ? ?Review of Systems ? ?Other than above, a 14 point review of systems is negative  ? ?  ? ? ?Past Medical History:  ?Diagnosis Date  ? Aortic stenosis   ? ECHO 5-20ll mild AS but no evidence of HCM or MR--no further testing suggested   ? Blood transfusion without reported diagnosis 1971  ? after hysterectomy  ? Cancer [facility]) 2019  ? squamous cell carcinoma- on chest  ? Depression   ? GERD (gastroesophageal reflux disease)   ? History of gastroesophageal reflux (GERD)   ? Hx of adenomatous colonic polyps   ? Hx of cardiac cath 2006  ? neg  ? Hypertension   ? Mixed urge and stress incontinence   ? OAB (overactive bladder) 07/13/2012  ? Osteoarthritis   ? Osteopenia   ? rx fosamax 08-2013  ? Personal history of colonic polyps - adenomas 04/19/2014  ? ? ?Past Surgical History:  ?Procedure Laterality Date  ? ABDOMINAL HYSTERECTOMY  1971  ? APPENDECTOMY  1971  ? (at time of hyst?)  ? BLADDER SURGERY  2003  ? CATARACT EXTRACTION, BILATERAL    ? CHOLECYSTECTOMY    ? OOPHORECTOMY  1971  ? ROTATOR CUFF REPAIR Right   ? TOE SURGERY  1995  ? R 2nd --- correction of claw toe   ? ?Social History  ? ?Socioeconomic History  ? Marital status: Widowed  ?  Spouse name: Not on file  ? Number of children: 2  ? Years of education: Not on file  ? Highest education level: Not on file  ?Occupational History  ? Occupation: stays at home   ?  Employer: RETIRED  ?Tobacco Use  ? Smoking status: Never  ? Smokeless tobacco: Never  ?Vaping Use  ? Vaping Use: Never used  ?Substance and Sexual Activity  ? Alcohol use: Not Currently  ?  Comment: wine rarely  ? Drug use: No  ? Sexual activity: Not Currently  ?Other Topics Concern  ? Not on file  ?Social History Narrative  ? Lives at a   independent living community   ? Lost Husband 06/2019  ? 2 children, Mark ([employer]), daughter Darnelle Bos ([employer]), she is very supportive  ? ?Social Determinants of Health  ? ?Financial Resource Strain: Low Risk   ? Difficulty of Paying Living Expenses: Not hard at all  ?Food Insecurity: No Food Insecurity  ? Worried About Charity fundraiser in the Last Year: Never true  ? Ran Out of Food in the Last Year: Never true  ?Transportation Needs: No Transportation Needs  ? Lack of Transportation (Medical): No  ? Lack of Transportation (Non-Medical): No  ?Physical Activity: Inactive  ? Days of Exercise per Week: 0 days  ? Minutes of Exercise per Session: 0 min  ?Stress: No Stress Concern Present  ? Feeling of Stress : Not at all  ?Social Connections: Moderately Integrated  ? Frequency of Communication with Friends and Family: More than three times a week  ? Frequency of Social Gatherings with Friends and Family: More than three times a week  ? Attends Religious Services: More than 4 times per year  ? Active Member of Clubs or Organizations: Yes  ? Attends Club  or Organization Meetings: More than 4 times per year  ? Marital Status: Widowed  ?Intimate Partner Violence: Not At Risk  ? Fear of Current or Ex-Partner: No  ? Emotionally Abused: No  ? Physically Abused: No  ? Sexually Abused: No  ? ? ? ?Current Outpatient Medications  ?Medication Instructions  ? Ascorbic Acid (VITAMIN C PO) Oral, Daily  ? Cholecalciferol 1,000 Units, Oral, Daily,    ? Cinnamon 1,000 mg, Oral, 2 times daily  ? denosumab (PROLIA) 60 mg, Subcutaneous, Every 6 months  ? Iron-Vitamins (GERITOL PO) Oral, Daily  ? memantine (NAMENDA) 5 mg, Oral, 2 times daily  ? mirabegron ER (MYRBETRIQ) 25 mg, Oral, Daily  ? Multiple Vitamins-Minerals (OCUVITE PO) 1 tablet, Oral, Daily,    ? NIFEdipine (PROCARDIA XL/NIFEDICAL XL) 60 mg, Oral, Daily  ? omeprazole (PRILOSEC) 20 mg, Oral, 2 times daily before meals  ? simvastatin (ZOCOR) 20 mg, Oral, Daily  ?  venlafaxine XR (EFFEXOR-XR) 75 mg, Oral, Daily with breakfast  ? vitamin E 600 Units, Oral, Daily,    ? ? ?   ?Objective:  ? Physical Exam ?BP 132/84 (BP Location: Left Arm, Patient Position: Sitting, Cuff Size: Small)   Pulse 68   Temp 97.7 ?F (36.5 ?C) (Oral)   Resp 18   Ht '5\' 7"'$  (1.702 m)   Wt 188 lb 2 oz (85.3 kg)   SpO2 98%   BMI 29.46 kg/m?  ?General: ?Well developed, NAD, BMI noted ?Neck: No  thyromegaly  ?HEENT:  ?Normocephalic . Face symmetric, atraumatic ?Lungs:  ?CTA B ?Normal respiratory effort, no intercostal retractions, no accessory muscle use. ?Heart: RRR, + systolic murmur ?Abdomen:  ?Not distended, soft, non-tender. No rebound or rigidity.   ?Lower extremities: no pretibial edema bilaterally  ?Skin: Exposed areas without rash. Not pale. Not jaundice ?Neurologic:  ?alert & oriented X3.  ?Speech normal, gait assisted by cane strength symmetric and appropriate for age.  ?Psych: ?Cognition and judgment appear intact.  ?Cooperative with normal attention span and concentration.  ?Behavior appropriate. ?No anxious or depressed appearing. ? ?   ?Assessment   ? ?Assessment ?HTN ?Hyperlipidemia  ?GERD ?Depression ?Dementia: DX 02/2020: MRI brain no acute, labs negative, Rx Namenda ?Osteoporosis:  T score plus ankle fracture 03-2019: ?t score 2008 -1.8, 2011 -1.8, 2014 -2.1: Fosamax Rx 08-2013 ?t score 03-2016: -2.1. ? 10/2020 Tscore  -2.7: Switch from Fosamax to Prolia ?H/o vit d def  ?DJD ?Aortic stenosis ( at some point diagnosed with HOCM) ?---echo 01-2010: "Mild AS but no evidence of HCM or MR.  ?---Echo 6-15 , 05-2019 :stable mild AoS. ?Overactive bladder- pcp started to rx meds 09-2016  ?Ao [place]: wnl 02-2015 ?[facility]  ? ? ?PLAN: ?Here for CPX ?Chronic medical problems seem well controlled ?Good compliance with medications. ?Overactive bladder symptoms were relatively well controlled on Myrbetriq. ?Social: Lives at an independent community, very happy there, her daughter is very involved with her care. ?RTC 6  months ? ? ? ? ?This visit occurred during the SARS-CoV-2 public health emergency.  Safety protocols were in place, including screening questions prior to the visit, additional usage of staff PPE, and extensive cleaning of exam room while observing appropriate contact time as indicated for disinfecting solutions.  ? ?

## 2021-12-12 ENCOUNTER — Encounter: Payer: Self-pay | Admitting: Internal Medicine

## 2021-12-12 MED ORDER — VITAMIN D (ERGOCALCIFEROL) 1.25 MG (50000 UNIT) PO CAPS: 50000.0000 [IU] | ORAL_CAPSULE | ORAL | 0 refills | Status: DC

## 2021-12-12 NOTE — Assessment & Plan Note (Signed)
Here for CPX ?Chronic medical problems seem well controlled ?Good compliance with medications. ?Overactive bladder symptoms were relatively well controlled on Myrbetriq. ?Social: Lives at an independent community, very happy there, her daughter is very involved with her care. ?RTC 6 months ?

## 2021-12-12 NOTE — Assessment & Plan Note (Signed)
--  Tdap 2020 ?- PNM 23:2005, 2010, 2021 ?-prevnar: 2015 ?- zostavax 2012 ?- s/p shingrex x2 ?-COVID vax utd  ? --no  further breast, colon, cervical cancer screening ?-- Labs: cbc, cmp, vitamin D ?--Patient education: Fall prevention, advance directives (daughter Lelon Frohlich is her power of attorney), recommend to bring a copy. ?  ?

## 2021-12-12 NOTE — Addendum Note (Signed)
Addended byDamita Dunnings D on: 12/12/2021 04:26 PM ? ? Modules accepted: Orders ? ?

## 2022-01-29 ENCOUNTER — Other Ambulatory Visit: Payer: Self-pay | Admitting: Internal Medicine

## 2022-01-29 NOTE — Telephone Encounter (Signed)
Patient > 60 days past due for Prolia injection.   If you would like for pt to continue with Prolia therapy, please have clinical staff reach out to pt for scheduling and to explain to importance of receiving Prolia injections every 6 months as abrupt cessation of Prolia raises risk of osteoporotic fracture.    "Discontinuation of Dmab is associated with a 3- to 5-fold higher risk for vertebral, major osteoporotic, and hip fractures [38,39]."   [email or web address]

## 2022-01-29 NOTE — Telephone Encounter (Signed)
Please arrange Prolia ASAP. If unable to proceed let me know

## 2022-02-13 ENCOUNTER — Other Ambulatory Visit: Payer: Self-pay | Admitting: Internal Medicine

## 2022-02-13 NOTE — Telephone Encounter (Signed)
Called and left a VM for daughter Darnelle Bos to f/u and see if they were able to obtain assistance for the copay, or if the pt needs a different tx option.

## 2022-02-16 ENCOUNTER — Other Ambulatory Visit: Payer: Self-pay | Admitting: Internal Medicine

## 2022-03-14 NOTE — Telephone Encounter (Signed)
Pt archived in [email or web address].  Please advise if patient and/or provider wish to proceed with Prolia therpay.  

## 2022-05-11 ENCOUNTER — Ambulatory Visit (INDEPENDENT_AMBULATORY_CARE_PROVIDER_SITE_OTHER): Payer: Medicare Other

## 2022-05-11 VITALS — Ht 67.0 in | Wt 188.0 lb

## 2022-05-11 DIAGNOSIS — Z0000 Encounter for general adult medical examination without abnormal findings: Secondary | ICD-10-CM | POA: Diagnosis not present

## 2022-05-11 NOTE — Progress Notes (Signed)
Subjective:   [name] is a 86 y.o. female who presents for Medicare Annual (Subsequent) preventive examination.   I connected with Elton today by telephone and verified that I am speaking with the correct person using two identifiers. Location patient: home Location provider: work Persons participating in the virtual visit: patient, Marine scientist.    I discussed the limitations, risks, security and privacy concerns of performing an evaluation and management service by telephone and the availability of in person appointments. I also discussed with the patient that there may be a patient responsible charge related to this service. The patient expressed understanding and verbally consented to this telephonic visit.    Interactive audio and video telecommunications were attempted between this provider and patient, however failed, due to patient having technical difficulties OR patient did not have access to video capability.  We continued and completed visit with audio only.  Some vital signs may be absent or patient reported.   Time Spent with patient on telephone encounter: 20 minutes  Review of Systems     Cardiac Risk Factors include: advanced age (>71mn, >>77women);hypertension;dyslipidemia     Objective:    Today's Vitals   05/11/22 0918  Weight: 188 lb (85.3 kg)  Height: '5\' 7"'$  (1.702 m)   Body mass index is 29.44 kg/m.     05/11/2022    9:22 AM 05/05/2021   11:08 AM 03/07/2021   11:39 PM 08/11/2020    3:06 PM 07/16/2020    9:55 AM 05/02/2020   10:26 AM 03/07/2019    2:02 PM  Advanced Directives  Does Patient Have a Medical Advance Directive? Yes Yes No Yes Yes Yes Yes  Type of ASpecial educational needs teacherPower of [place] Does patient want to make changes to medical advance directive?    No - Patient declined  No -  Patient declined No - Patient declined  [place] of [place]? No - copy requested   No - copy requested  No - copy requested No - copy requested  Would patient like information on creating a medical advance directive?   No - Patient declined        Current Medications (verified) Outpatient Encounter Medications as of 05/11/2022  Medication Sig   Ascorbic Acid (VITAMIN C PO) Take by mouth daily.   Cholecalciferol 25 MCG (1000 UT) capsule Take 1,000 Units by mouth daily.   Cinnamon 500 MG capsule Take 1,000 mg by mouth 2 (two) times daily.   denosumab (PROLIA) 60 MG/ML SOSY injection Inject 60 mg into the skin every 6 (six) months.   Iron-Vitamins (GERITOL PO) Take by mouth daily.   memantine (NAMENDA) 5 MG tablet Take 1 tablet (5 mg total) by mouth 2 (two) times daily.   Multiple Vitamins-Minerals (OCUVITE PO) Take 1 tablet by mouth daily.   NIFEdipine (PROCARDIA XL/NIFEDICAL XL) 60 MG 24 hr tablet TAKE 1 TABLET BY MOUTH DAILY   omeprazole (PRILOSEC) 20 MG capsule TAKE 1 CAPSULE BY MOUTH TWICE  DAILY BEFORE A MEAL   simvastatin (ZOCOR) 20 MG tablet TAKE 1 TABLET BY MOUTH DAILY   venlafaxine XR (EFFEXOR-XR) 75 MG 24 hr capsule TAKE 1 CAPSULE BY MOUTH DAILY  WITH BREAKFAST   Vitamin D, Ergocalciferol, (DRISDOL) 1.25 MG (50000 UNIT) CAPS capsule Take 1 capsule (50,000 Units total) by mouth every 7 (seven) days.  vitamin E 600 UNIT capsule Take 600 Units by mouth daily.   mirabegron ER (MYRBETRIQ) 25 MG TB24 tablet Take 1 tablet (25 mg total) by mouth daily. (Patient not taking: Reported on 05/11/2022)   No facility-administered encounter medications on file as of 05/11/2022.    Allergies (verified) Patient has no known allergies.   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   Depression    GERD (gastroesophageal reflux disease)    History of gastroesophageal reflux (GERD)    Hx of adenomatous colonic polyps    Hx of cardiac cath 2006   neg   Hypertension    Mixed urge and stress incontinence    OAB (overactive bladder) 07/13/2012   Osteoarthritis    Osteopenia    rx fosamax 08-2013   Personal history of colonic polyps - adenomas 04/19/2014   Past Surgical History:  Procedure Laterality Date   [place]   (at time of hyst?)   BLADDER SURGERY  2003   CATARACT EXTRACTION, BILATERAL     CHOLECYSTECTOMY     OOPHORECTOMY  1971   ROTATOR CUFF REPAIR Right    TOE SURGERY  1995   R 2nd --- correction of claw toe    Family History  Problem Relation Age of Onset   Coronary artery disease Father 28       had CABG   Cancer Father        gallbladder   Stroke Mother 16   Ovarian cancer Sister 83   Colon cancer Neg Hx        NEGATIVE   Breast cancer Neg Hx        NEGATIVE   Social History   Socioeconomic History   Marital status: Widowed    Spouse name: Not on file   Number of children: 2   Years of education: Not on file   Highest education level: Not on file  Occupational History   Occupation: stays at home     Employer: RETIRED  Tobacco Use   Smoking status: Never   Smokeless tobacco: Never  Vaping Use   Vaping Use: Never used  Substance and Sexual Activity   Alcohol use: Not Currently    Comment: wine rarely   Drug use: No   Sexual activity: Not Currently  Other Topics Concern   Not on file  Social History Narrative   Lives at a  independent living community    Lost Husband 06/2019   2 children, Doctor, general practice ([employer]), daughter Darnelle Bos ([employer] police), she is very supportive   Social Determinants of Health   Financial Resource Strain: Low Risk  (05/11/2022)   Overall Financial Resource Strain (CARDIA)    Difficulty of Paying Living Expenses: Not hard  at all  Food Insecurity: No Food Insecurity (05/11/2022)   Hunger Vital Sign    Worried About Running Out of Food in the Last Year: Never true    [place] in the Last Year: Never true  Transportation Needs: No Transportation Needs (05/11/2022)   PRAPARE - Hydrologist (Medical): No    Lack of Transportation (Non-Medical): No  Physical Activity: Sufficiently Active (05/11/2022)  Exercise Vital Sign    Days of Exercise per Week: 7 days    Minutes of Exercise per Session: 30 min  Stress: No Stress Concern Present (05/11/2022)   [place]    Feeling of Stress : Only a little  Social Connections: Moderately Isolated (05/11/2022)   Social Connection and Isolation Panel [NHANES]    Frequency of Communication with Friends and Family: More than three times a week    Frequency of Social Gatherings with Friends and Family: More than three times a week    Attends Religious Services: More than 4 times per year    Active Member of [employer] or Organizations: No    Attends Archivist Meetings: Never    Marital Status: Widowed    Tobacco Counseling Counseling given: Not Answered   Clinical Intake:  Pre-visit preparation completed: Yes  Pain : No/denies pain     BMI - recorded: 29.44 Nutritional Status: BMI 25 -29 Overweight Nutritional Risks: None Diabetes: No  How often do you need to have someone help you when you read instructions, pamphlets, or other written materials from your doctor or pharmacy?: 1 - Never  Diabetic?No  Interpreter Needed?: No  Information entered by :: Caroleen Hamman LPN   Activities of Daily Living    05/11/2022    9:26 AM  In your present state of health, do you have any difficulty performing the following activities:  Hearing? 0  Vision? 0  Difficulty concentrating or making decisions? 0  Walking or climbing stairs? 0  Dressing or bathing? 0   Doing errands, shopping? 1  Preparing Food and eating ? Y  Using the Toilet? N  In the past six months, have you accidently leaked urine? Y  Do you have problems with loss of bowel control? N  Managing your Medications? N  Managing your Finances? Y  Housekeeping or managing your Housekeeping? Y    Patient Care Team: Colon Branch, MD as PCP - General Festus Aloe, MD as Consulting Physician (Urology) Gatha Mayer, MD as Consulting Physician (Gastroenterology) Cherre Robins, [place] (Pharmacist)  Indicate any recent Medical Services you may have received from other than [facility] providers in the past year (date may be approximate).     Assessment:   This is a routine wellness examination for Charlann.  Hearing/Vision screen Hearing Screening - Comments:: No issues Vision Screening - Comments:: Last eye exam-04/2021  Dietary issues and exercise activities discussed: Current Exercise Habits: Home exercise routine, Type of exercise: walking, Time (Minutes): 30, Frequency (Times/Week): 7, Weekly Exercise (Minutes/Week): 210, Intensity: Mild   Goals Addressed             This Visit's Progress    DIET - INCREASE WATER INTAKE   Not on track    Patient Stated   On track    Increase activity       Depression Screen    05/11/2022    9:25 AM 12/11/2021   10:44 AM 06/24/2021    1:54 PM 05/05/2021   11:10 AM 04/29/2021    2:55 PM 02/21/2021    2:07 PM 10/22/2020   10:30 AM  PHQ 2/9 Scores  PHQ - 2 Score 1 0 0 0 0 0 5  PHQ- 9 Score  1 0   0 16    Fall Risk    05/11/2022    9:23 AM 06/24/2021    1:54 PM 05/15/2021   11:12 AM 05/05/2021   11:10  AM 04/29/2021    2:55 PM  Fall Risk   Falls in the past year? 0 '1 1 1 1  '$ Number falls in past yr: 0 0 1 1 0  Injury with Fall? 0 1 1 0 0  Risk for fall due to :   History of fall(s);Impaired balance/gait History of fall(s)   Follow up Falls prevention discussed Falls evaluation completed Education provided;Falls prevention discussed  Falls prevention discussed Falls evaluation completed  Comment   Patient has been using walker; starts physical therapy today.      FALL RISK PREVENTION PERTAINING TO THE HOME:  Any stairs in or around the home? Yes  If so, are there any without handrails? No  Home free of loose throw rugs in walkways, pet beds, electrical cords, etc? Yes  Adequate lighting in your home to reduce risk of falls? Yes   ASSISTIVE DEVICES UTILIZED TO PREVENT FALLS:  Life alert? Yes  Use of a cane, walker or w/c? Yes cane Grab bars in the bathroom? Yes  Shower chair or bench in shower? Yes  Elevated toilet seat or a handicapped toilet? Yes   TIMED UP AND GO:  Was the test performed? No . Phone visit   Cognitive Function:Patient has current diagnosis of cognitive impairment.      05/02/2020   10:36 AM 02/26/2020    2:47 PM  MMSE - Mini Mental State Exam  Not completed: Refused   Orientation to time  4  Orientation to Place  5  Registration  3  Attention/ Calculation  3  Recall  2  Language- name 2 objects  2  Language- repeat  1  Language- follow 3 step command  3  Language- read & follow direction  1  Write a sentence  1  Copy design  0  Total score  25        05/11/2022    9:34 AM  6CIT Screen  What Year? 4 points  What month? 0 points  What time? 0 points  Count back from 20 2 points  Months in reverse 4 points  Repeat phrase 8 points  Total Score 18 points    Immunizations Immunization History  Administered Date(s) Administered   Fluad Quad(high Dose 65+) 06/01/2019   Influenza Split 07/15/2011, 06/01/2012   Influenza Whole 08/18/2007, 06/15/2008, 06/12/2009, 06/12/2010   Influenza, High Dose Seasonal PF 07/26/2018   Influenza,inj,Quad PF,6+ Mos 06/06/2013, 06/11/2014, 05/24/2015   Influenza-Unspecified 08/14/2016, 06/30/2020   PFIZER Comirnaty(Gray Top)Covid-19 Tri-Sucrose Vaccine 02/21/2021   PFIZER(Purple Top)SARS-COV-2 Vaccination 10/09/2019, 10/30/2019, 06/30/2020    Pfizer Covid-19 Vaccine Bivalent Booster 18yr & up 06/24/2021   Pneumococcal Conjugate-13 02/14/2014   Pneumococcal Polysaccharide-23 09/15/2003, 11/21/2008, 07/31/2020   Td 06/24/2007   Tdap 05/13/2019   Zoster Recombinat (Shingrix) 10/22/2020, 12/20/2020   Zoster, Live 08/17/2011    TDAP status: Up to date  Flu Vaccine status: Up to date  Pneumococcal vaccine status: Up to date  Covid-19 vaccine status: Completed vaccines  Qualifies for Shingles Vaccine? No   Zostavax completed Yes   Shingrix Completed?: Yes  Screening Tests Health Maintenance  Topic Date Due   COVID-19 Vaccine (6 - Pfizer series) 10/25/2021   INFLUENZA VACCINE  04/14/2022   TETANUS/TDAP  05/12/2029   Pneumonia Vaccine 84 Years old  Completed   DEXA SCAN  Completed   Zoster Vaccines- Shingrix  Completed   HPV VACCINES  Aged Out   COLONOSCOPY (Pts 45-463yrInsurance coverage will need to be confirmed)  Discontinued  Health Maintenance  Health Maintenance Due  Topic Date Due   COVID-19 Vaccine (6 - Pfizer series) 10/25/2021   INFLUENZA VACCINE  04/14/2022    Colorectal cancer screening: No longer required.   Mammogram status: No longer required due to patient declines due to age.  Bone Density status: Completed 10/28/2020. Results reflect: Bone density results: OSTEOPOROSIS. Repeat every 2 years.  Lung Cancer Screening: (Low Dose CT Chest recommended if Age 16-80 years, 30 pack-year currently smoking OR have quit w/in 15years.) does not qualify.     Additional Screening:  Hepatitis C Screening: does not qualify  Vision Screening: Recommended annual ophthalmology exams for early detection of glaucoma and other disorders of the eye. Is the patient up to date with their annual eye exam?  Yes  Who is the provider or what is the name of the office in which the patient attends annual eye exams? Pt unsure of name   Dental Screening: Recommended annual dental exams for proper oral  hygiene  Community Resource Referral / Chronic Care Management: CRR required this visit?  No   CCM required this visit?  No      Plan:     I have personally reviewed and noted the following in the patient's chart:   Medical and social history Use of alcohol, tobacco or illicit drugs  Current medications and supplements including opioid prescriptions. Patient is not currently taking opioid prescriptions. Functional ability and status Nutritional status Physical activity Advanced directives List of other physicians Hospitalizations, surgeries, and ER visits in previous 12 months Vitals Screenings to include cognitive, depression, and falls Referrals and appointments  In addition, I have reviewed and discussed with patient certain preventive protocols, quality metrics, and best practice recommendations. A written personalized care plan for preventive services as well as general preventive health recommendations were provided to patient.   Due to this being a telephonic visit, the after visit summary with patients personalized plan was offered to patient via mail or my-chart. Patient to pick up at office at next visit.   Marta Antu, LPN   [id number]  Nurse Health Advisor\  Nurse Notes: None

## 2022-05-11 NOTE — Patient Instructions (Signed)
[name] , Thank you for taking time to complete your Medicare Wellness Visit. I appreciate your ongoing commitment to your health goals. Please review the following plan we discussed and let me know if I can assist you in the future.   Screening recommendations/referrals: Colonoscopy: No longer required Mammogram: Declined Bone Density: Completed 10/28/2020-Due 10/28/2022 Recommended yearly ophthalmology/optometry visit for glaucoma screening and checkup Recommended yearly dental visit for hygiene and checkup  Vaccinations: Influenza vaccine: Up to date-Due 05/2022 Pneumococcal vaccine: Up to date Tdap vaccine: up to date-Due 04/2029 Shingles vaccine: Completed vaccines   Covid-19:Up to date  Advanced directives: Please bring a copy of Living Will and/or Healthcare Power of Attorney for your chart.   Conditions/risks identified: See problem list  Next appointment: Follow up in one year for your annual wellness visit    Preventive Care 65 Years and Older, Female Preventive care refers to lifestyle choices and visits with your health care provider that can promote health and wellness. What does preventive care include? A yearly physical exam. This is also called an annual well check. Dental exams once or twice a year. Routine eye exams. Ask your health care provider how often you should have your eyes checked. Personal lifestyle choices, including: Daily care of your teeth and gums. Regular physical activity. Eating a healthy diet. Avoiding tobacco and drug use. Limiting alcohol use. Practicing safe sex. Taking low-dose aspirin every day. Taking vitamin and mineral supplements as recommended by your health care provider. What happens during an annual well check? The services and screenings done by your health care provider during your annual well check will depend on your age, overall health, lifestyle risk factors, and family history of disease. Counseling  Your health care  provider may ask you questions about your: Alcohol use. Tobacco use. Drug use. Emotional well-being. Home and relationship well-being. Sexual activity. Eating habits. History of falls. Memory and ability to understand (cognition). Work and work Statistician. Reproductive health. Screening  You may have the following tests or measurements: Height, weight, and BMI. Blood pressure. Lipid and cholesterol levels. These may be checked every 5 years, or more frequently if you are over 64 years old. Skin check. Lung cancer screening. You may have this screening every year starting at age 86 if you have a 30-pack-year history of smoking and currently smoke or have quit within the past 15 years. Fecal occult blood test (FOBT) of the stool. You may have this test every year starting at age 53. Flexible sigmoidoscopy or colonoscopy. You may have a sigmoidoscopy every 5 years or a colonoscopy every 10 years starting at age 76. Hepatitis C blood test. Hepatitis B blood test. Sexually transmitted disease (STD) testing. Diabetes screening. This is done by checking your blood sugar (glucose) after you have not eaten for a while (fasting). You may have this done every 1-3 years. Bone density scan. This is done to screen for osteoporosis. You may have this done starting at age 33. Mammogram. This may be done every 1-2 years. Talk to your health care provider about how often you should have regular mammograms. Talk with your health care provider about your test results, treatment options, and if necessary, the need for more tests. Vaccines  Your health care provider may recommend certain vaccines, such as: Influenza vaccine. This is recommended every year. Tetanus, diphtheria, and acellular pertussis (Tdap, Td) vaccine. You may need a Td booster every 10 years. Zoster vaccine. You may need this after age 27. Pneumococcal 13-valent conjugate (PCV13)  vaccine. One dose is recommended after age  37. Pneumococcal polysaccharide (PPSV23) vaccine. One dose is recommended after age 10. Talk to your health care provider about which screenings and vaccines you need and how often you need them. This information is not intended to replace advice given to you by your health care provider. Make sure you discuss any questions you have with your health care provider. Document Released: 09/27/2015 Document Revised: 05/20/2016 Document Reviewed: 07/02/2015 Elsevier Interactive Patient Education  2017 [place] Prevention in the Home Falls can cause injuries. They can happen to people of all ages. There are many things you can do to make your home safe and to help prevent falls. What can I do on the outside of my home? Regularly fix the edges of walkways and driveways and fix any cracks. Remove anything that might make you trip as you walk through a door, such as a raised step or threshold. Trim any bushes or trees on the path to your home. Use bright outdoor lighting. Clear any walking paths of anything that might make someone trip, such as rocks or tools. Regularly check to see if handrails are loose or broken. Make sure that both sides of any steps have handrails. Any raised decks and porches should have guardrails on the edges. Have any leaves, snow, or ice cleared regularly. Use sand or salt on walking paths during winter. Clean up any spills in your garage right away. This includes oil or grease spills. What can I do in the bathroom? Use night lights. Install grab bars by the toilet and in the tub and shower. Do not use towel bars as grab bars. Use non-skid mats or decals in the tub or shower. If you need to sit down in the shower, use a plastic, non-slip stool. Keep the floor dry. Clean up any water that spills on the floor as soon as it happens. Remove soap buildup in the tub or shower regularly. Attach bath mats securely with double-sided non-slip rug tape. Do not have throw  rugs and other things on the floor that can make you trip. What can I do in the bedroom? Use night lights. Make sure that you have a light by your bed that is easy to reach. Do not use any sheets or blankets that are too big for your bed. They should not hang down onto the floor. Have a firm chair that has side arms. You can use this for support while you get dressed. Do not have throw rugs and other things on the floor that can make you trip. What can I do in the kitchen? Clean up any spills right away. Avoid walking on wet floors. Keep items that you use a lot in easy-to-reach places. If you need to reach something above you, use a strong step stool that has a grab bar. Keep electrical cords out of the way. Do not use floor polish or wax that makes floors slippery. If you must use wax, use non-skid floor wax. Do not have throw rugs and other things on the floor that can make you trip. What can I do with my stairs? Do not leave any items on the stairs. Make sure that there are handrails on both sides of the stairs and use them. Fix handrails that are broken or loose. Make sure that handrails are as long as the stairways. Check any carpeting to make sure that it is firmly attached to the stairs. Fix any carpet that is loose  or worn. Avoid having throw rugs at the top or bottom of the stairs. If you do have throw rugs, attach them to the floor with carpet tape. Make sure that you have a light switch at the top of the stairs and the bottom of the stairs. If you do not have them, ask someone to add them for you. What else can I do to help prevent falls? Wear shoes that: Do not have high heels. Have rubber bottoms. Are comfortable and fit you well. Are closed at the toe. Do not wear sandals. If you use a stepladder: Make sure that it is fully opened. Do not climb a closed stepladder. Make sure that both sides of the stepladder are locked into place. Ask someone to hold it for you, if  possible. Clearly mark and make sure that you can see: Any grab bars or handrails. First and last steps. Where the edge of each step is. Use tools that help you move around (mobility aids) if they are needed. These include: Canes. Walkers. Scooters. Crutches. Turn on the lights when you go into a dark area. Replace any light bulbs as soon as they burn out. Set up your furniture so you have a clear path. Avoid moving your furniture around. If any of your floors are uneven, fix them. If there are any pets around you, be aware of where they are. Review your medicines with your doctor. Some medicines can make you feel dizzy. This can increase your chance of falling. Ask your doctor what other things that you can do to help prevent falls. This information is not intended to replace advice given to you by your health care provider. Make sure you discuss any questions you have with your health care provider. Document Released: 06/27/2009 Document Revised: 02/06/2016 Document Reviewed: 10/05/2014 Elsevier Interactive Patient Education  2017 [employer].

## 2022-05-21 DIAGNOSIS — H52223 Regular astigmatism, bilateral: Secondary | ICD-10-CM | POA: Diagnosis not present

## 2022-05-21 DIAGNOSIS — H04123 Dry eye syndrome of bilateral lacrimal glands: Secondary | ICD-10-CM | POA: Diagnosis not present

## 2022-05-21 DIAGNOSIS — Z961 Presence of intraocular lens: Secondary | ICD-10-CM | POA: Diagnosis not present

## 2022-05-21 DIAGNOSIS — H353131 Nonexudative age-related macular degeneration, bilateral, early dry stage: Secondary | ICD-10-CM | POA: Diagnosis not present

## 2022-05-21 DIAGNOSIS — H524 Presbyopia: Secondary | ICD-10-CM | POA: Diagnosis not present

## 2022-05-30 IMAGING — CR DG SHOULDER 2+V*R*
2 series · 2 of 2 positions shown · non-contrast
Comparison: Concurrent chest radiograph.

CLINICAL DATA: Chronic shoulder pain.

EXAM:
RIGHT SHOULDER - 2+ VIEW

[w shoulder external right]
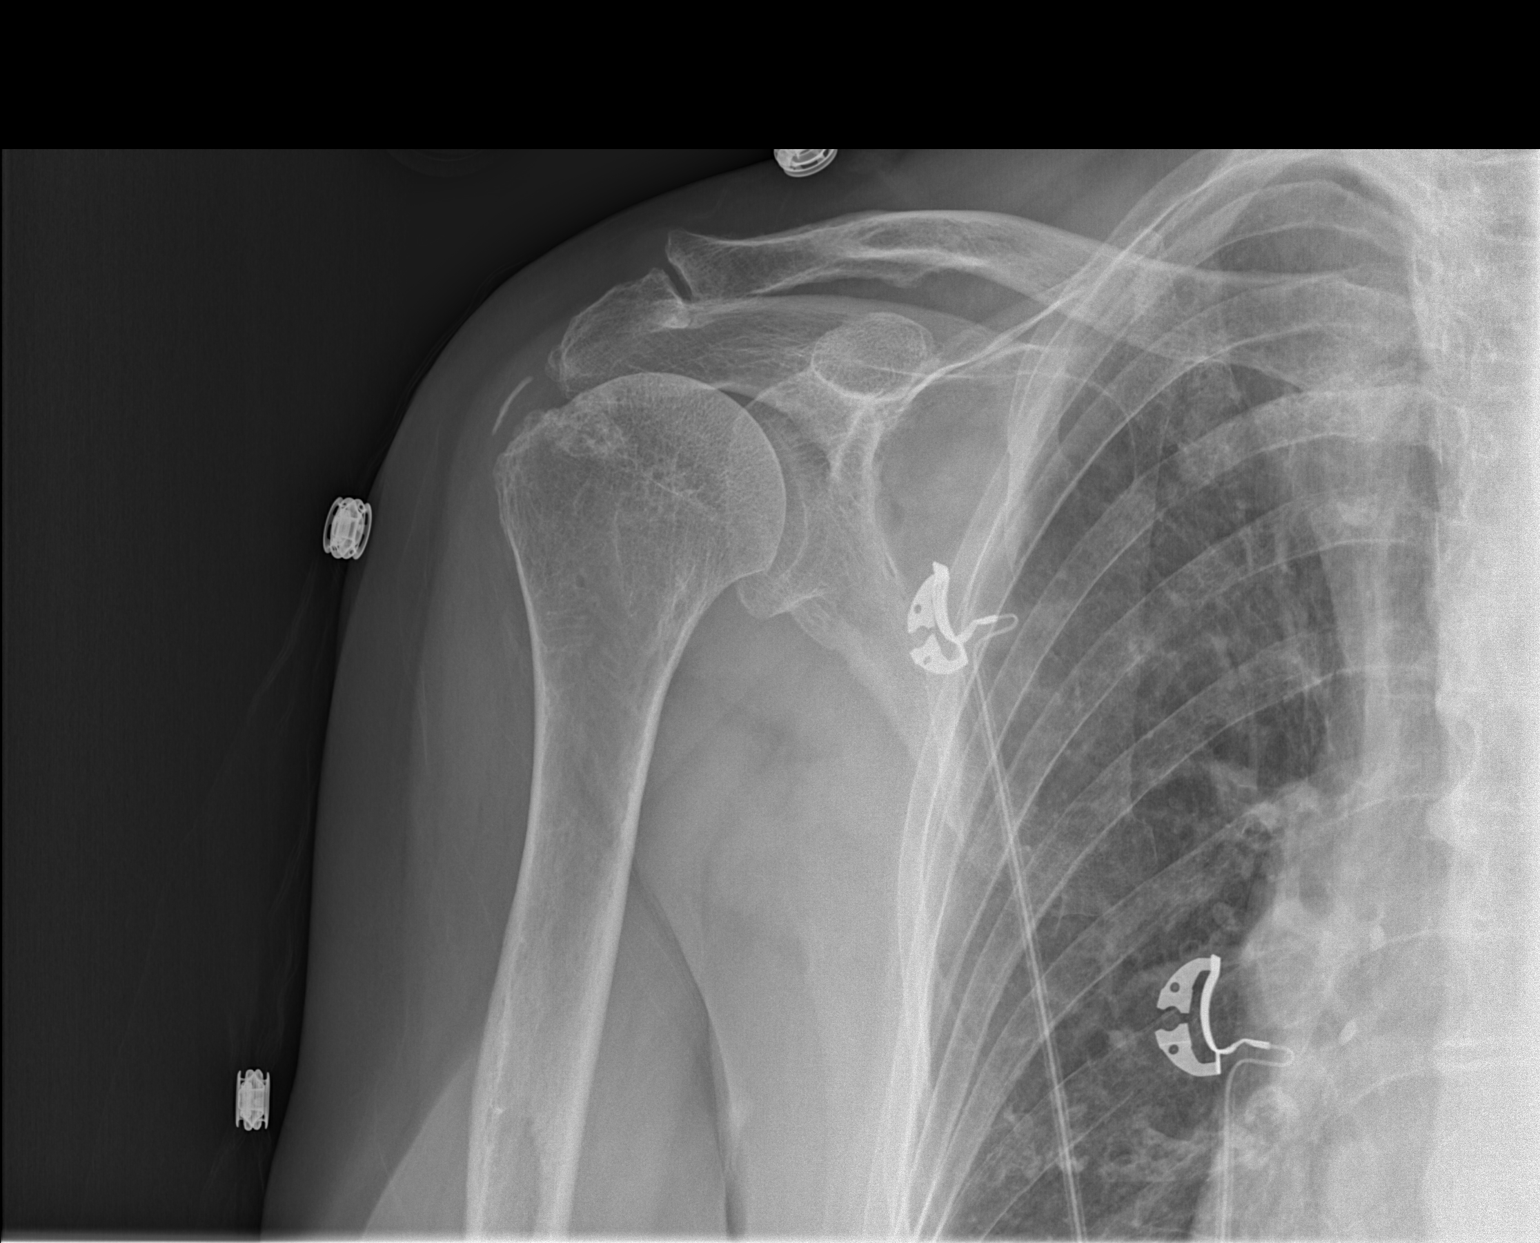

[w shoulder y-view right]
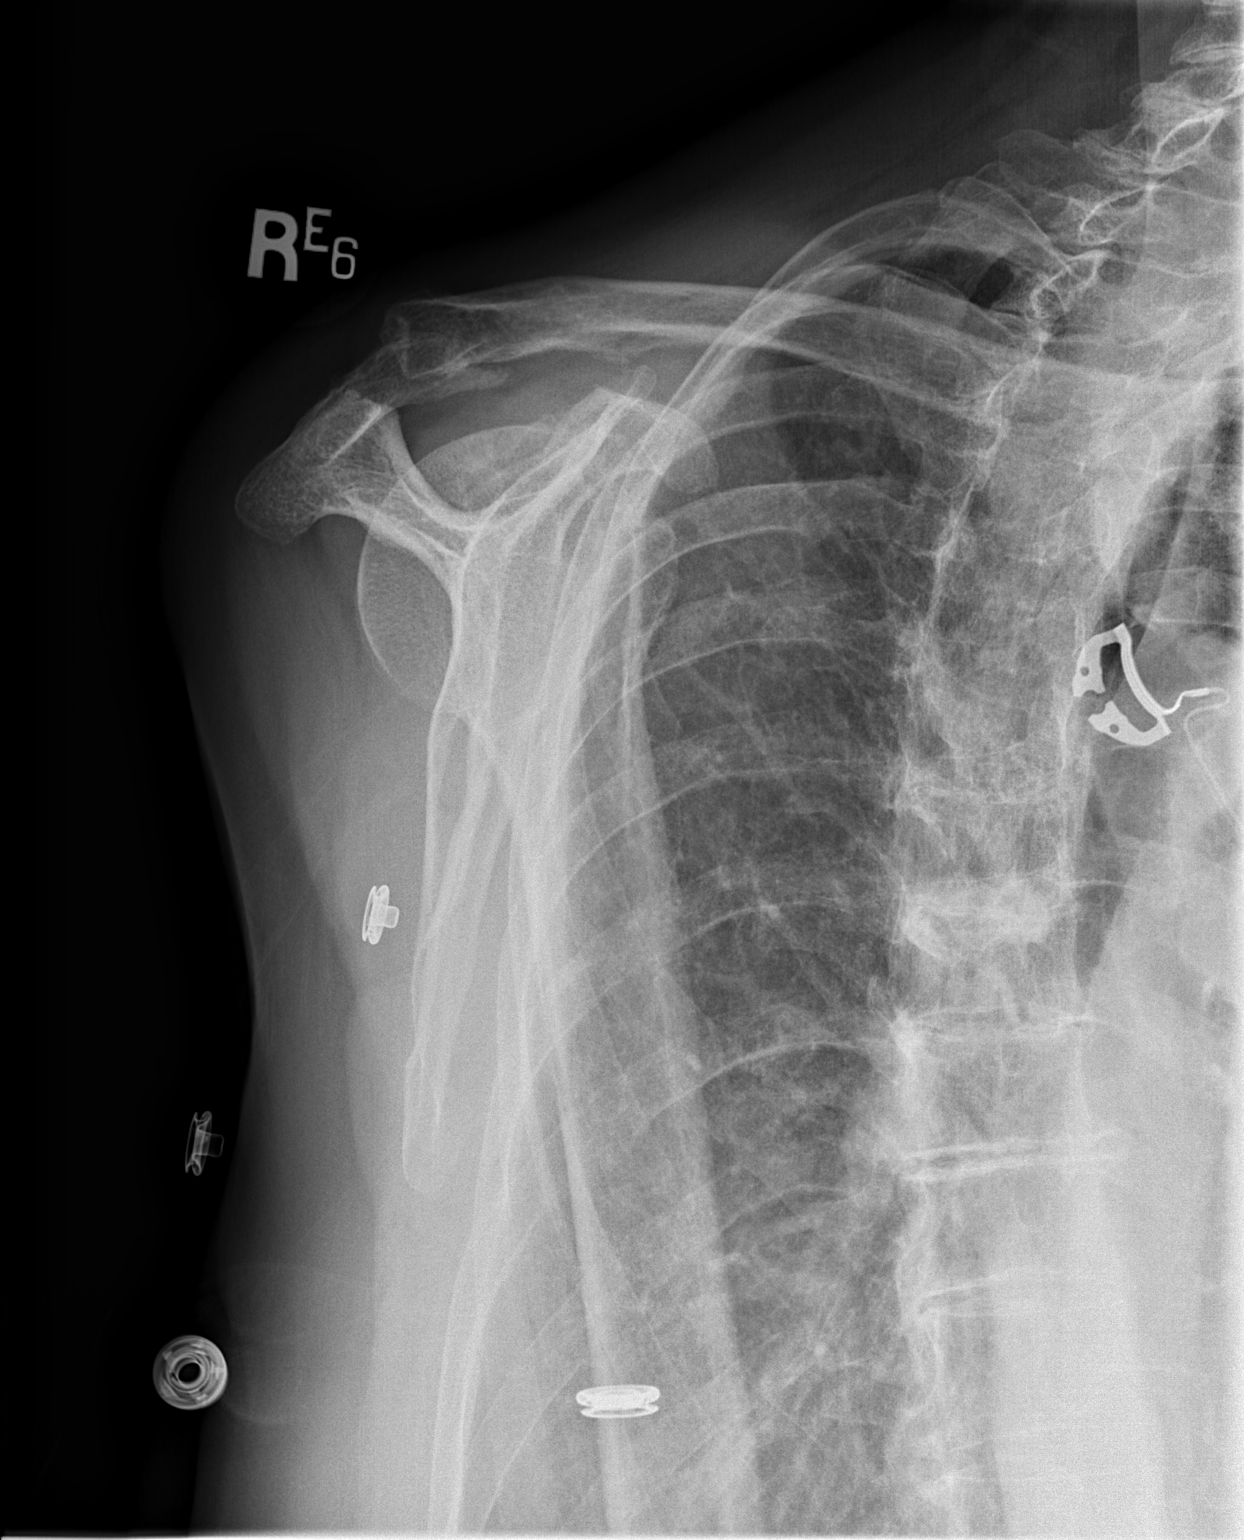

[2 of 2 positions shown; findings below may reference images not displayed]

FINDINGS: Normal alignment with approximation of the joints. No fracture.
Acromioclavicular degenerative spurring and joint space loss. Slight
irregularity of the greater tuberosity with overlying curvilinear
calcific density.
IMPRESSION: No acute osseous abnormality. Mild acromioclavicular osteoarthrosis.

Sequela of supraspinatus calcific tendinopathy.

## 2022-06-09 ENCOUNTER — Encounter: Payer: Self-pay | Admitting: Internal Medicine

## 2022-06-09 ENCOUNTER — Other Ambulatory Visit (HOSPITAL_BASED_OUTPATIENT_CLINIC_OR_DEPARTMENT_OTHER): Payer: Self-pay

## 2022-06-09 ENCOUNTER — Ambulatory Visit (INDEPENDENT_AMBULATORY_CARE_PROVIDER_SITE_OTHER): Payer: Medicare Other | Admitting: Internal Medicine

## 2022-06-09 VITALS — BP 136/82 | HR 87 | Temp 98.1°F | Resp 18 | Ht 67.0 in | Wt 187.4 lb

## 2022-06-09 DIAGNOSIS — I10 Essential (primary) hypertension: Secondary | ICD-10-CM | POA: Diagnosis not present

## 2022-06-09 DIAGNOSIS — E785 Hyperlipidemia, unspecified: Secondary | ICD-10-CM | POA: Diagnosis not present

## 2022-06-09 DIAGNOSIS — E559 Vitamin D deficiency, unspecified: Secondary | ICD-10-CM | POA: Diagnosis not present

## 2022-06-09 DIAGNOSIS — R739 Hyperglycemia, unspecified: Secondary | ICD-10-CM | POA: Diagnosis not present

## 2022-06-09 DIAGNOSIS — F0390 Unspecified dementia without behavioral disturbance: Secondary | ICD-10-CM

## 2022-06-09 LAB — VITAMIN D 25 HYDROXY (VIT D DEFICIENCY, FRACTURES): VITD: 24.83 ng/mL — ABNORMAL LOW (ref 30.00–100.00)

## 2022-06-09 LAB — BASIC METABOLIC PANEL
BUN: 29 mg/dL — ABNORMAL HIGH (ref 6–23)
CO2: 26 mEq/L (ref 19–32)
Calcium: 10 mg/dL (ref 8.4–10.5)
Chloride: 104 mEq/L (ref 96–112)
Creatinine, Ser: 1.19 mg/dL (ref 0.40–1.20)
GFR: 42.06 mL/min — ABNORMAL LOW (ref >60.00)
Glucose, Bld: 101 mg/dL — ABNORMAL HIGH (ref 70–99)
Potassium: 3.8 mEq/L (ref 3.5–5.1)
Sodium: 140 mEq/L (ref 135–145)

## 2022-06-09 LAB — LIPID PANEL
Cholesterol: 165 mg/dL (ref 0–200)
HDL: 56 mg/dL (ref >39.00)
LDL Cholesterol: 84 mg/dL (ref 0–99)
NonHDL: 109.37
Total CHOL/HDL Ratio: 3
Triglycerides: 127 mg/dL (ref 0.0–149.0)
VLDL: 25.4 mg/dL (ref 0.0–40.0)

## 2022-06-09 LAB — HEMOGLOBIN A1C: Hgb A1c MFr Bld: 5.9 % (ref 4.6–6.5)

## 2022-06-09 MED ORDER — FLUAD QUADRIVALENT 0.5 ML IM PRSY
PREFILLED_SYRINGE | INTRAMUSCULAR | 0 refills | Status: DC
  Filled 2022-06-09: qty 0.5, 1d supply, fill #0

## 2022-06-09 MED ORDER — MIRABEGRON ER 25 MG PO TB24: 25.0000 mg | ORAL_TABLET | Freq: Every day | ORAL | 1 refills | Status: DC

## 2022-06-09 NOTE — Progress Notes (Signed)
Subjective:    Patient ID: [name], female    DOB: Jul 16, 1936, 86 y.o.   MRN: [id number]  DOS:  06/09/2022 Type of visit - description: ROV  Since the last office visit reports she is doing well.  Has no major concerns. No recent falls.  Uses a cane or a walker. Request a refill on Myrbetriq.  Review of Systems See above   Past Medical History:  Diagnosis Date   Aortic stenosis    ECHO 5-20ll mild AS but no evidence of HCM or MR--no further testing suggested    Blood transfusion without reported diagnosis 1971   after hysterectomy   Cancer ([place]) 2019   squamous cell carcinoma- on chest   Depression    GERD (gastroesophageal reflux disease)    History of gastroesophageal reflux (GERD)    Hx of adenomatous colonic polyps    Hx of cardiac cath 2006   neg   Hypertension    Mixed urge and stress incontinence    OAB (overactive bladder) 07/13/2012   Osteoarthritis    Osteopenia    rx fosamax 08-2013   Personal history of colonic polyps - adenomas 04/19/2014    Past Surgical History:  Procedure Laterality Date   ABDOMINAL HYSTERECTOMY  1971   APPENDECTOMY  1971   (at time of hyst?)   BLADDER SURGERY  2003   CATARACT EXTRACTION, BILATERAL     CHOLECYSTECTOMY     [place] Right    TOE SURGERY  1995   R 2nd --- correction of claw toe     Current Outpatient Medications  Medication Instructions   Ascorbic Acid (VITAMIN C PO) Oral, Daily   Cholecalciferol 1,000 Units, Oral, Daily,     Cinnamon 1,000 mg, Oral, 2 times daily   denosumab (PROLIA) 60 mg, Every 6 months   Iron-Vitamins (GERITOL PO) Oral, Daily   memantine (NAMENDA) 5 mg, Oral, 2 times daily   mirabegron ER (MYRBETRIQ) 25 mg, Oral, Daily   Multiple Vitamins-Minerals (OCUVITE PO) 1 tablet, Oral, Daily,     NIFEdipine (PROCARDIA XL/NIFEDICAL XL) 60 MG 24 hr tablet TAKE 1 TABLET BY MOUTH DAILY   omeprazole (PRILOSEC) 20 MG capsule TAKE 1 CAPSULE BY MOUTH TWICE  DAILY BEFORE  A MEAL   simvastatin (ZOCOR) 20 MG tablet TAKE 1 TABLET BY MOUTH DAILY   venlafaxine XR (EFFEXOR-XR) 75 MG 24 hr capsule TAKE 1 CAPSULE BY MOUTH DAILY  WITH BREAKFAST   Vitamin D (Ergocalciferol) (DRISDOL) 50,000 Units, Oral, Every 7 days   vitamin E 600 Units, Oral, Daily,         Objective:   Physical Exam BP 136/82   Pulse 87   Temp 98.1 F (36.7 C) (Oral)   Resp 18   Ht '5\' 7"'$  (1.702 m)   Wt 187 lb 6 oz (85 kg)   SpO2 96%   BMI 29.35 kg/m  General:   Well developed, NAD, BMI noted. HEENT:  Normocephalic . Face symmetric, atraumatic Lower extremities: no pretibial edema bilaterally  Skin: Not pale. Not jaundice Neurologic:  MMSE 21 Speech normal, gait appropriate for age and I see that back came Psych--  Behavior appropriate. No anxious or depressed appearing.      Assessment     Assessment HTN Hyperlipidemia  GERD Depression Dementia: -sxs started shortly after she lost her husband.  MMSE 25 - DX 02/2020: MRI brain no acute, labs negative, Rx Namenda Osteoporosis:  T score plus ankle fracture  03-2019: t score 2008 -1.8, 2011 -1.8, 2014 -2.1: Fosamax Rx 08-2013 t score 03-2016: -2.1.  10/2020 Tscore  -2.7: Switch from Fosamax to Prolia H/o vit d def  DJD- Aortic stenosis ( at some point diagnosed with HOCM) ---echo 01-2010: "Mild AS but no evidence of HCM or MR.  ---Echo 6-15 , 05-2019 :stable mild AoS. Overactive bladder- pcp started to rx meds 09-2016  Ao [place]: wnl 02-2015 [facility]     PLAN: ROV HTN: BP is very good, recommend to check regularly, continue Procardia, check BMP Hyperlipidemia: On simvastatin, last LFTs normal, check FLP Depression: On Effexor,  feels very well. Dementia: On Namenda, MMSE today 21, decreased from previous  one.  She seems very functional.  Lives at a assisted living facility, does not drive, no recent falls.  We could consider donepezil but she is doing very well.  Hold off for now. Osteoporosis: Based on DEXA from 10-2019, we switch  from Fosamax ( which she took intermittently for few years) to [place], last shot was 05-2021, we like her to continue getting Prolia, left messages for the family, we have no been able to contact them.  See AVS, asked for them to call.  Prolia is expensive, if unable to restart will go back on Fosamax. Vitamin D deficiency: Had ergocalciferol, now on OTCs, recommend 2000 units daily.  Check levels. Plans to get a flu shot at the pharmacy RTC 4 months

## 2022-06-09 NOTE — Assessment & Plan Note (Signed)
ROV HTN: BP is very good, recommend to check regularly, continue Procardia, check BMP Hyperlipidemia: On simvastatin, last LFTs normal, check FLP Depression: On Effexor,  feels very well. Dementia: On Namenda, MMSE today 21, decreased from previous  one.  She seems very functional.  Lives at a assisted living facility, does not drive, no recent falls.  We could consider donepezil but she is doing very well.  Hold off for now. Osteoporosis: Based on DEXA from 10-2019, we switch from Fosamax ( which she took intermittently for few years) to [place], last shot was 05-2021, we like her to continue getting Prolia, left messages for the family, we have no been able to contact them.  See AVS, asked for them to call.  Prolia is expensive, if unable to restart will go back on Fosamax. Vitamin D deficiency: Had ergocalciferol, now on OTCs, recommend 2000 units daily.  Check levels. Plans to get a flu shot at the pharmacy RTC 4 months

## 2022-06-09 NOTE — Patient Instructions (Addendum)
Vaccines I recommend:  Covid booster RSV vaccine  We like to restart her Prolia injections, please discuss with daughters, they can call the office anytime  Be sure you take vitamin D over-the-counter: 2000 units daily  Check the  blood pressure regularly BP GOAL is between 110/65 and  135/85. If it is consistently higher or lower, let me know     GO TO THE LAB : Get the blood work     [place], [place] back for   a checkup in 4 months    Fall Prevention in the Home, Adult Falls can cause injuries and affect people of all ages. There are many simple things that you can do to make your home safe and to help prevent falls. Ask for help when making these changes, if needed. What actions can I take to prevent falls? General instructions Use good lighting in all rooms. Replace any light bulbs that burn out, turn on lights if it is dark, and use night-lights. Place frequently used items in easy-to-reach places. Lower the shelves around your home if necessary. Set up furniture so that there are clear paths around it. Avoid moving your furniture around. Remove throw rugs and other tripping hazards from the floor. Avoid walking on wet floors. Fix any uneven floor surfaces. Add color or contrast paint or tape to grab bars and handrails in your home. Place contrasting color strips on the first and last steps of staircases. When you use a stepladder, make sure that it is completely opened and that the sides and supports are firmly locked. Have someone hold the ladder while you are using it. Do not climb a closed stepladder. Know where your pets are when moving through your home. What can I do in the bathroom?     Keep the floor dry. Immediately clean up any water that is on the floor. Remove soap buildup in the tub or shower regularly. Use nonskid mats or decals on the floor of the tub or shower. Attach bath mats securely with double-sided,  nonslip rug tape. If you need to sit down while you are in the shower, use a plastic, nonslip stool. Install grab bars by the toilet and in the tub and shower. Do not use towel bars as grab bars. What can I do in the bedroom? Make sure that a bedside light is easy to reach. Do not use oversized bedding that reaches the floor. Have a firm chair that has side arms to use for getting dressed. What can I do in the kitchen? Clean up any spills right away. If you need to reach for something above you, use a sturdy step stool that has a grab bar. Keep electrical cables out of the way. Do not use floor polish or wax that makes floors slippery. If you must use wax, make sure that it is non-skid floor wax. What can I do with my stairs? Do not leave any items on the stairs. Make sure that you have a light switch at the top and the bottom of the stairs. Have them installed if you do not have them. Make sure that there are handrails on both sides of the stairs. Fix handrails that are broken or loose. Make sure that handrails are as long as the staircases. Install non-slip stair treads on all stairs in your home. Avoid having throw rugs at the top or bottom of stairs, or secure the rugs with carpet tape to prevent them  from moving. Choose a carpet design that does not hide the edge of steps on the stairs. Check any carpeting to make sure that it is firmly attached to the stairs. Fix any carpet that is loose or worn. What can I do on the outside of my home? Use bright outdoor lighting. Regularly repair the edges of walkways and driveways and fix any cracks. Remove high doorway thresholds. Trim any shrubbery on the main path into your home. Regularly check that handrails are securely fastened and in good repair. Both sides of all steps should have handrails. Install guardrails along the edges of any raised decks or porches. Clear walkways of debris and clutter, including tools and rocks. Have leaves,  snow, and ice cleared regularly. Use sand or salt on walkways during winter months. In the garage, clean up any spills right away, including grease or oil spills. What other actions can I take? Wear closed-toe shoes that fit well and support your feet. Wear shoes that have rubber soles or low heels. Use mobility aids as needed, such as canes, walkers, scooters, and crutches. Review your medicines with your health care provider. Some medicines can cause dizziness or changes in blood pressure, which increase your risk of falling. Talk with your health care provider about other ways that you can decrease your risk of falls. This may include working with a physical therapist or trainer to improve your strength, balance, and endurance. Where to find more information Centers for Disease Control and Prevention, [employer]: http://www.wolf.info/ National Institute on Aging: http://kim-miller.com/ Contact a health care provider if: You are afraid of falling at home. You feel weak, drowsy, or dizzy at home. You fall at home. Summary There are many simple things that you can do to make your home safe and to help prevent falls. Ways to make your home safe include removing tripping hazards and installing grab bars in the bathroom. Ask for help when making these changes in your home. This information is not intended to replace advice given to you by your health care provider. Make sure you discuss any questions you have with your health care provider. Document Revised: 06/02/2021 Document Reviewed: 04/03/2020 Elsevier Patient Education  [employer].

## 2022-06-10 MED ORDER — VITAMIN D (ERGOCALCIFEROL) 1.25 MG (50000 UNIT) PO CAPS: 50000.0000 [IU] | ORAL_CAPSULE | ORAL | 0 refills | Status: DC

## 2022-06-10 NOTE — Addendum Note (Signed)
Addended byDamita Dunnings D on: 06/10/2022 08:24 AM   Modules accepted: Orders

## 2022-06-25 IMAGING — CR DG KNEE COMPLETE 4+V*L*
4 series · 4 of 4 positions shown · non-contrast
Comparison: None.

CLINICAL DATA: Status post fall.

EXAM:
LEFT KNEE - COMPLETE 4+ VIEW

[t knee ap left]
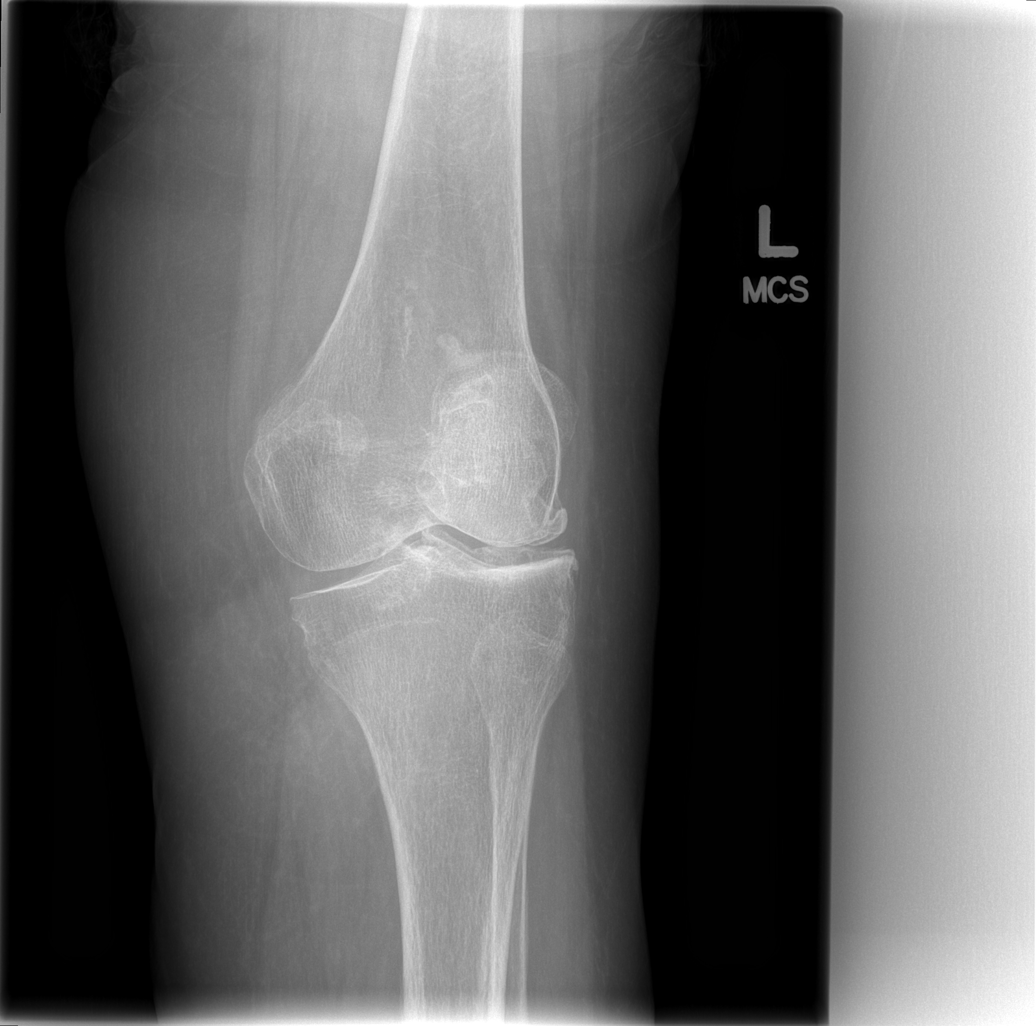

[t knee oblique left (1 of 2)]
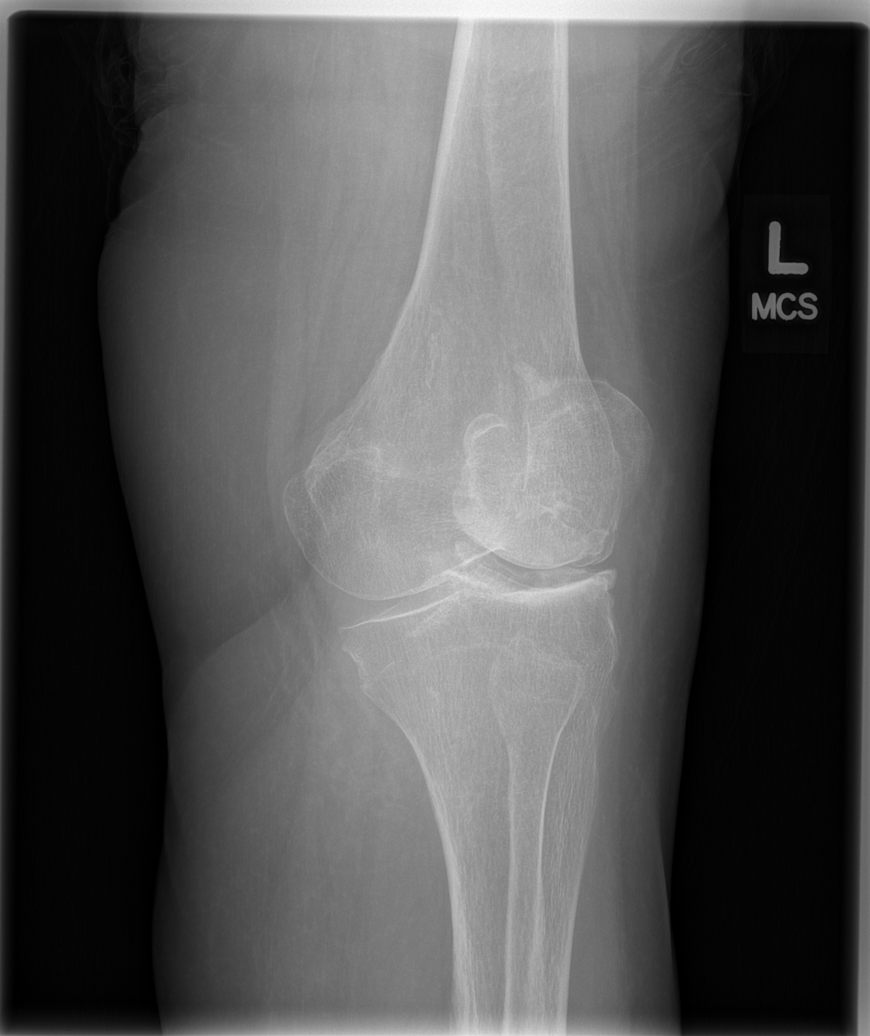

[t knee oblique left (2 of 2)]
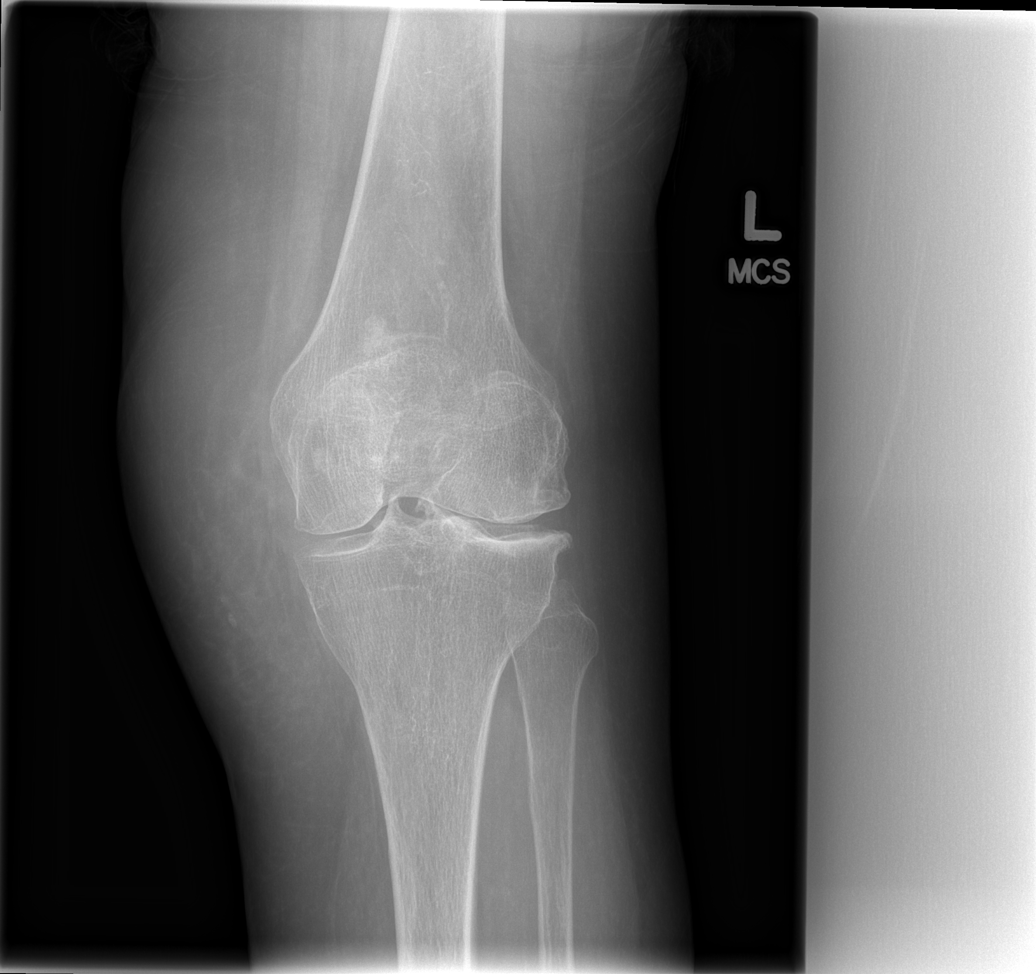

[t knee lat left]
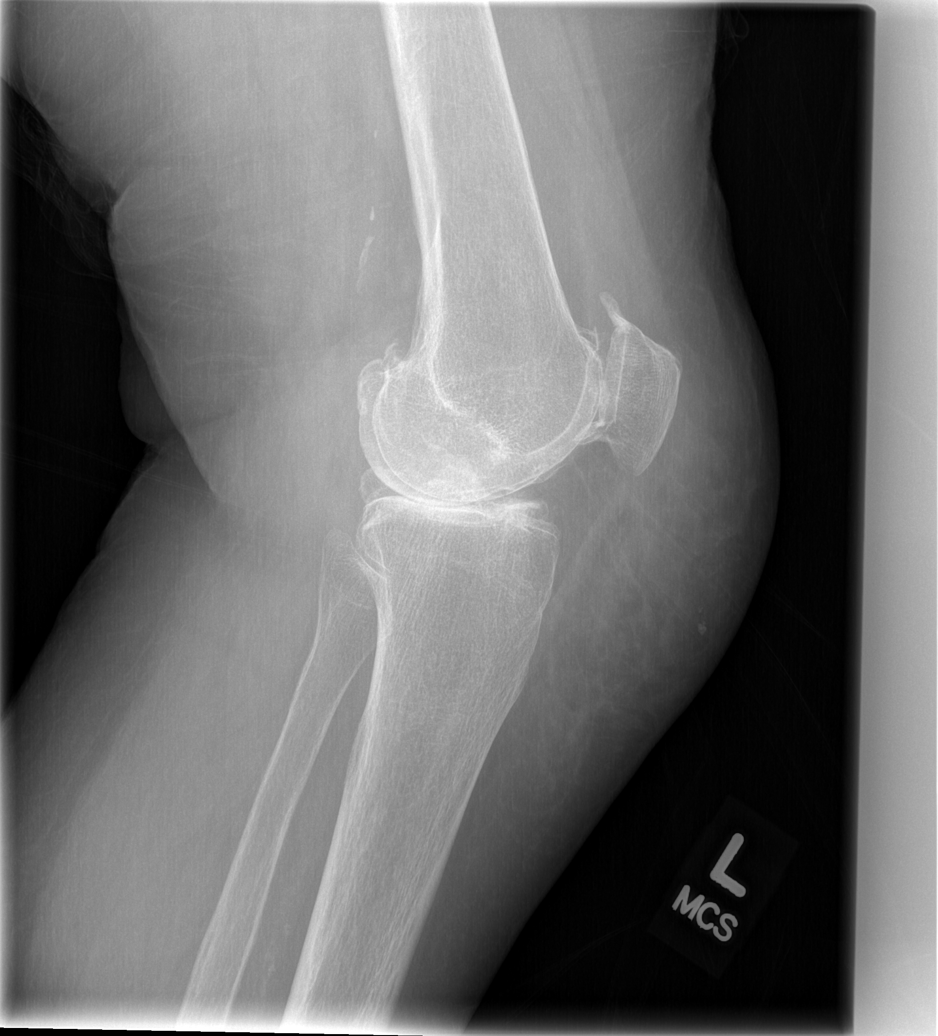

[4 of 4 positions shown; findings below may reference images not displayed]

FINDINGS: No evidence of fracture, dislocation, or joint effusion. Moderate 3
compartment osteoarthritic changes of the left knee. Prepatellar
soft tissue swelling.
IMPRESSION: 1. No acute fracture or dislocation identified about the left knee.
2. Prepatellar soft tissue swelling.
3. Moderate 3 compartment osteoarthritic changes of the left knee.

## 2022-06-25 IMAGING — CR DG KNEE COMPLETE 4+V*R*
6 series · 6 of 6 positions shown · non-contrast
Comparison: Right knee radiographs dated 03/19/2009.

CLINICAL DATA: Fall with pain.

EXAM:
RIGHT KNEE - COMPLETE 4+ VIEW

[t knee ap right]
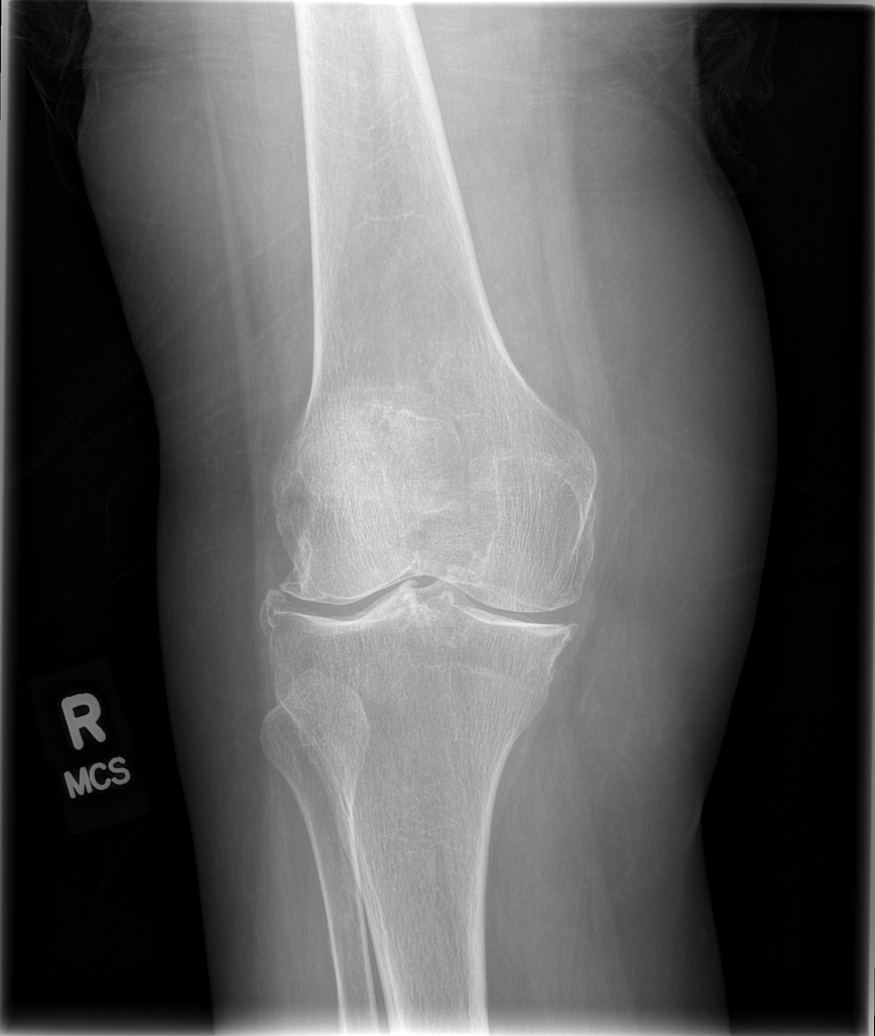

[t knee oblique right (1 of 2)]
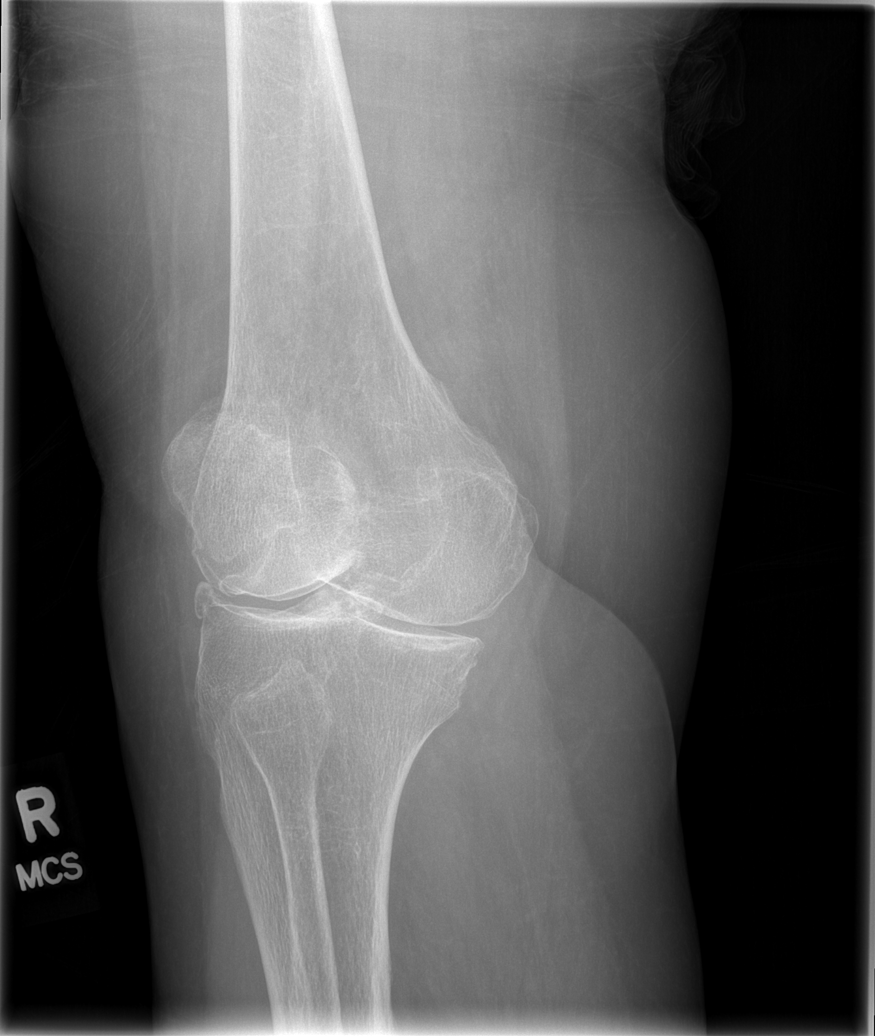

[t knee oblique right *]
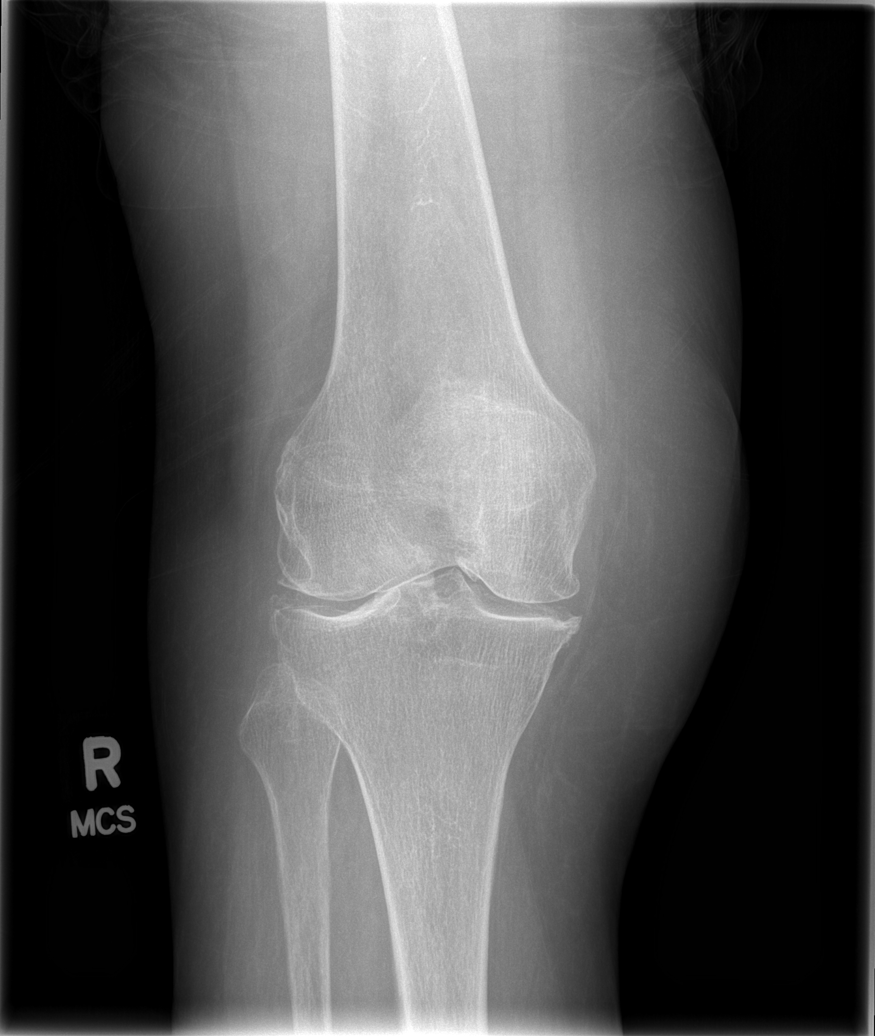

[t knee oblique right (2 of 2)]
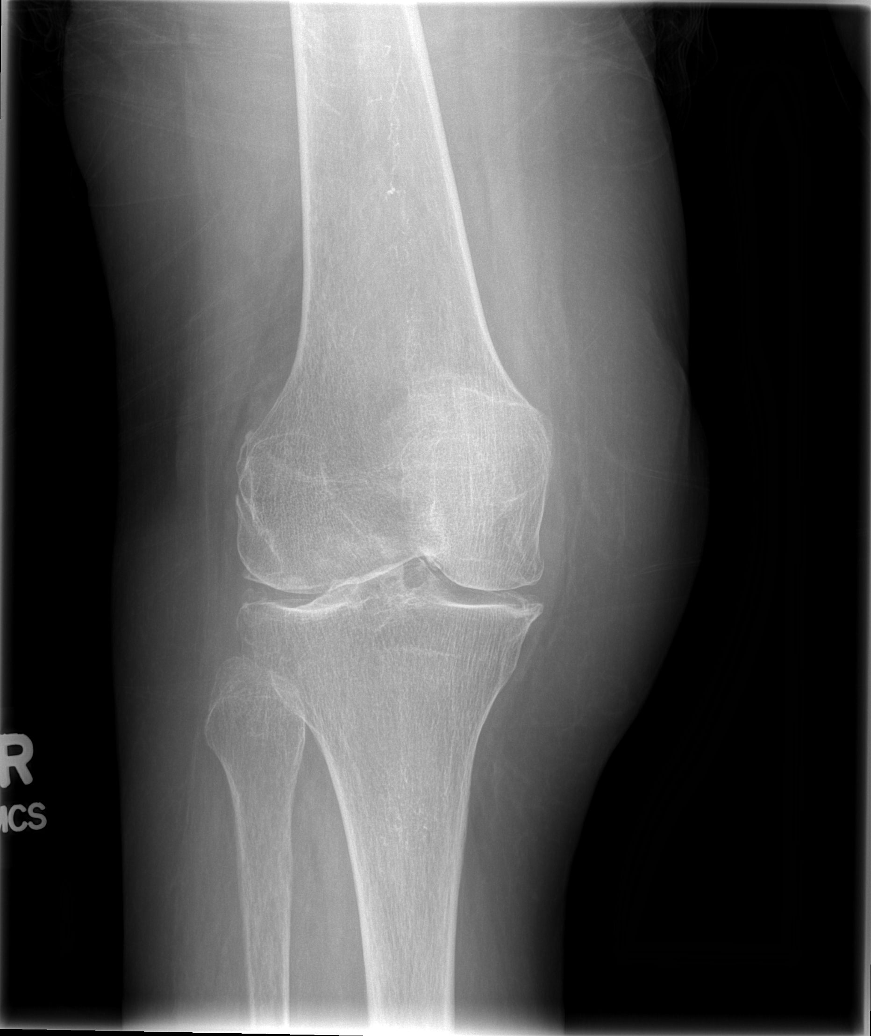

[t knee lat right (1 of 2)]
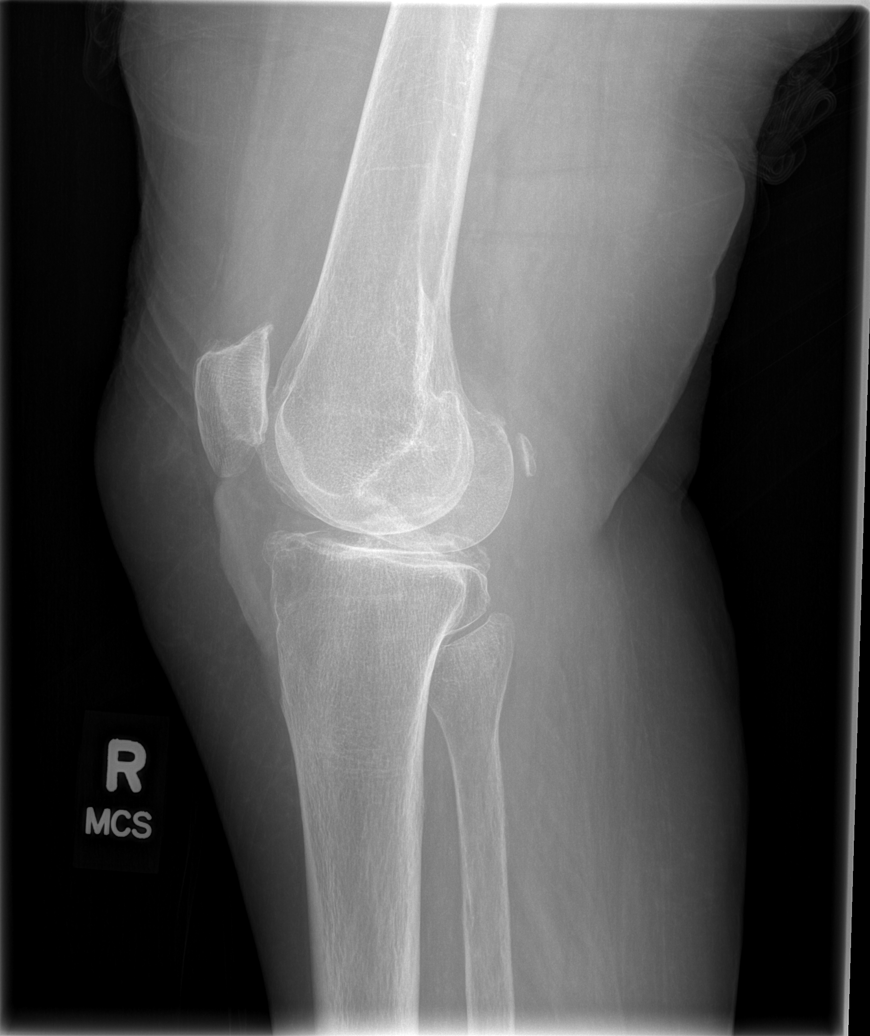

[t knee lat right (2 of 2)]
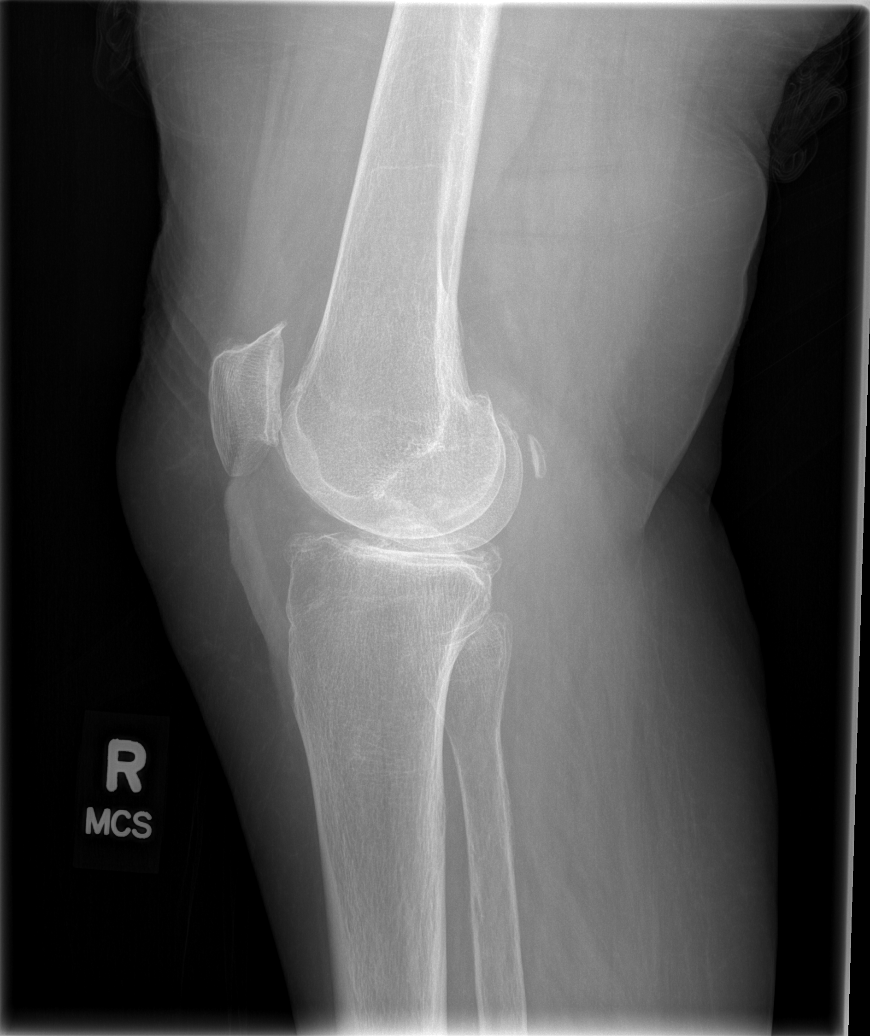

[6 of 6 positions shown; findings below may reference images not displayed]

FINDINGS: No evidence of fracture, dislocation, or joint effusion. There is
moderate tricompartmental osteoarthritis. Soft tissues are
unremarkable. No radiopaque foreign body is identified.
IMPRESSION: No acute osseous injury.

## 2022-07-14 ENCOUNTER — Encounter: Payer: Self-pay | Admitting: Internal Medicine

## 2022-07-14 ENCOUNTER — Ambulatory Visit (INDEPENDENT_AMBULATORY_CARE_PROVIDER_SITE_OTHER): Payer: Medicare Other | Admitting: Internal Medicine

## 2022-07-14 VITALS — BP 130/84 | HR 84 | Temp 97.9°F | Resp 18 | Ht 67.0 in | Wt 189.2 lb

## 2022-07-14 DIAGNOSIS — F32A Depression, unspecified: Secondary | ICD-10-CM

## 2022-07-14 DIAGNOSIS — F03B0 Unspecified dementia, moderate, without behavioral disturbance, psychotic disturbance, mood disturbance, and anxiety: Secondary | ICD-10-CM | POA: Diagnosis not present

## 2022-07-14 DIAGNOSIS — N390 Urinary tract infection, site not specified: Secondary | ICD-10-CM | POA: Diagnosis not present

## 2022-07-14 LAB — POC URINALSYSI DIPSTICK (AUTOMATED)
Bilirubin, UA: NEGATIVE
Blood, UA: NEGATIVE
Glucose, UA: NEGATIVE
Ketones, UA: NEGATIVE
Nitrite, UA: POSITIVE
Protein, UA: POSITIVE — AB
Spec Grav, UA: 1.01 (ref 1.010–1.025)
Urobilinogen, UA: 0.2 E.U./dL
pH, UA: 5 (ref 5.0–8.0)

## 2022-07-14 MED ORDER — SULFAMETHOXAZOLE-TRIMETHOPRIM 800-160 MG PO TABS: 1.0000 | ORAL_TABLET | Freq: Two times a day (BID) | ORAL | 0 refills | Status: DC

## 2022-07-14 NOTE — Patient Instructions (Addendum)
Take the antibiotic twice daily for 5 days, your urine show infection  Drink plenty of fluids  Definitely call if fever, chills, nausea, vomiting, burning with urination or severe symptoms.   Consider letting your family help you with your medicines  Please call with questions or concerns

## 2022-07-14 NOTE — Progress Notes (Unsigned)
Subjective:    Patient ID: [name], female    DOB: 1936/08/05, 86 y.o.   MRN: [id number]  DOS:  07/14/2022 Type of visit - description: Acute visit  Visit was prompted by the patient's daughter. In the last several days, she has become more confused, asking the same questions over and over. Also, she used to call her one of the times a day and now is 3-4 times a day.  She is not aware of any fever chills or recent falls. The patient arrange her medications herself so far.  The patient denies chest pain or difficulty breathing No cough No nausea vomiting or diarrhea.  She is occasionally constipation, last BM was today Denies dysuria or gross hematuria  She lives at independent living facility, she likes the place and is happy there however October is anniversary of the loss of her husband 3 years ago and November is the anniversary of the loss of her twin sister   many years ago  Review of Systems See above   Past Medical History:  Diagnosis Date   Aortic stenosis    ECHO 5-20ll mild AS but no evidence of HCM or MR--no further testing suggested    Blood transfusion without reported diagnosis 1971   after hysterectomy   Cancer ([place]) 2019   squamous cell carcinoma- on chest   Depression    GERD (gastroesophageal reflux disease)    History of gastroesophageal reflux (GERD)    Hx of adenomatous colonic polyps    Hx of cardiac cath 2006   neg   Hypertension    Mixed urge and stress incontinence    OAB (overactive bladder) 07/13/2012   Osteoarthritis    Osteopenia    rx fosamax 08-2013   Personal history of colonic polyps - adenomas 04/19/2014    Past Surgical History:  Procedure Laterality Date   ABDOMINAL HYSTERECTOMY  1971   APPENDECTOMY  1971   (at time of hyst?)   BLADDER SURGERY  2003   CATARACT EXTRACTION, BILATERAL     CHOLECYSTECTOMY     OOPHORECTOMY  1971   ROTATOR CUFF REPAIR Right    TOE SURGERY  1995   R 2nd --- correction of claw toe      Current Outpatient Medications  Medication Instructions   Ascorbic Acid (VITAMIN C PO) Oral, Daily   cholecalciferol (VITAMIN D3) 2,000 Units, Oral, Daily   Cinnamon 1,000 mg, Oral, 2 times daily   denosumab (PROLIA) 60 mg, Every 6 months   Iron-Vitamins (GERITOL PO) Oral, Daily   memantine (NAMENDA) 5 mg, Oral, 2 times daily   mirabegron ER (MYRBETRIQ) 25 mg, Oral, Daily   Multiple Vitamins-Minerals (OCUVITE PO) 1 tablet, Oral, Daily,     NIFEdipine (PROCARDIA XL/NIFEDICAL XL) 60 MG 24 hr tablet TAKE 1 TABLET BY MOUTH DAILY   omeprazole (PRILOSEC) 20 MG capsule TAKE 1 CAPSULE BY MOUTH TWICE  DAILY BEFORE A MEAL   simvastatin (ZOCOR) 20 MG tablet TAKE 1 TABLET BY MOUTH DAILY   venlafaxine XR (EFFEXOR-XR) 75 MG 24 hr capsule TAKE 1 CAPSULE BY MOUTH DAILY  WITH BREAKFAST   Vitamin D (Ergocalciferol) (DRISDOL) 50,000 Units, Oral, Every 7 days   vitamin E 600 Units, Oral, Daily,         Objective:   Physical Exam BP 130/84   Pulse 84   Temp 97.9 F (36.6 C) (Oral)   Resp 18   Ht '5\' 7"'$  (1.702 m)   Wt 189 lb 4 oz (85.8  kg)   SpO2 93%   BMI 29.64 kg/m  General:   Well developed, NAD, BMI noted.  HEENT:  Normocephalic . Face symmetric, atraumatic. Neck no thyromegaly Lungs:  CTA B Normal respiratory effort, no intercostal retractions, no accessory muscle use. Heart: RRR,  no murmur.  Abdomen:  Not distended, soft, non-tender. No rebound or rigidity.   Skin: Not pale. Not jaundice Lower extremities: no pretibial edema bilaterally  Neurologic:  alert & oriented self, recognizes her daughter, knows her date of birth, sheknows she is in the afternoon but did not remember the year. Did not remember the president's name. Speech normal, gait appropriate for age and assisted by cane. Psych--    Behavior appropriate. No anxious or depressed appearing.     Assessment     Assessment HTN Hyperlipidemia  GERD Depression Dementia: -sxs started shortly after she lost her  husband.  MMSE 25 - DX 02/2020: MRI brain no acute, labs negative, Rx Namenda Osteoporosis:  T score plus ankle fracture 03-2019: t score 2008 -1.8, 2011 -1.8, 2014 -2.1: Fosamax Rx 08-2013 t score 03-2016: -2.1.  10/2020 Tscore  -2.7: Switch from Fosamax to Prolia H/o vit d def  DJD- Aortic stenosis ( at some point diagnosed with HOCM) ---echo 01-2010: "Mild AS but no evidence of HCM or MR.  ---Echo 6-15 , 05-2019 :stable mild AoS. Overactive bladder- pcp started to rx meds 09-2016  Ao [place]: wnl 02-2015 [facility]     PLAN: Dementia: In the last few days, the family has noted that she is more confused and continue repeating questions constantly. Review of systems is essentially negative. The daughter wonders about UTI, U dip shows some evidence of infection.  We will send urine culture and start empiric Bactrim.  To seek medical attention if worse. Otherwise the more noticeable confusion could be natural progression of dementia or part of the [place] symptoms of this disease. The daughter also wonders about depression, this is a raff.  Time for the patient because is the anniversary of the loss of her husband and twin sister. Otherwise she tells me he is happy at the independent living facility she is at.  She denies depression per se. I agree with the daughter that we will observe the issue and consider adjust her Effexor. So far, the patient managed her medications by herself, encouraged the daughter to consider helping with medication management. Depression: See above UTI: See above  9-26 ROV HTN: BP is very good, recommend to check regularly, continue Procardia, check BMP Hyperlipidemia: On simvastatin, last LFTs normal, check FLP Depression: On Effexor,  feels very well. Dementia: On Namenda, MMSE today 21, decreased from previous  one.  She seems very functional.  Lives at a assisted living facility, does not drive, no recent falls.  We could consider donepezil but she is doing very well.   Hold off for now. Osteoporosis: Based on DEXA from 10-2019, we switch from Fosamax ( which she took intermittently for few years) to [place], last shot was 05-2021, we like her to continue getting Prolia, left messages for the family, we have no been able to contact them.  See AVS, asked for them to call.  Prolia is expensive, if unable to restart will go back on Fosamax. Vitamin D deficiency: Had ergocalciferol, now on OTCs, recommend 2000 units daily.  Check levels. Plans to get a flu shot at the pharmacy RTC 4 months

## 2022-07-15 LAB — URINALYSIS, ROUTINE W REFLEX MICROSCOPIC
Bilirubin Urine: NEGATIVE
Hgb urine dipstick: NEGATIVE
Ketones, ur: NEGATIVE
Nitrite: POSITIVE — AB
RBC / HPF: NONE SEEN (ref 0–?)
Specific Gravity, Urine: 1.01 (ref 1.000–1.030)
Total Protein, Urine: NEGATIVE
Urine Glucose: NEGATIVE
Urobilinogen, UA: 0.2 (ref 0.0–1.0)
pH: 5.5 (ref 5.0–8.0)

## 2022-07-15 NOTE — Assessment & Plan Note (Signed)
Dementia: In the last few days, the family has noted that she is more confused and continue repeating questions constantly. Review of systems is essentially negative. The daughter wonders about UTI, U dip shows some evidence of infection.  We will send urine culture and start empiric Bactrim.  To seek medical attention if worse. Otherwise the more noticeable confusion could be natural progression of dementia or part of the fluctuating sxs of this disease. The daughter also wonders about depression, this is a raff.  Time for the patient because is the anniversary of the loss of her husband and twin sister. The pt  tells me is happy at the independent living facility she is at but the daughter recognizes that this is a rough time of the year because the loss of her husband and a twin sister (see HPI).  The patient denies depression per se. We agreed to observe the issue and consider adjust her Effexor. So far, the patient managed her medications by herself, encouraged the daughter to consider helping with medication management. Depression: See above UTI: See above

## 2022-07-17 ENCOUNTER — Other Ambulatory Visit: Payer: Self-pay | Admitting: Internal Medicine

## 2022-07-17 LAB — URINE CULTURE
MICRO NUMBER:: 14124387
SPECIMEN QUALITY:: ADEQUATE

## 2022-08-17 ENCOUNTER — Other Ambulatory Visit: Payer: Self-pay | Admitting: Internal Medicine

## 2022-09-27 ENCOUNTER — Other Ambulatory Visit: Payer: Self-pay | Admitting: Internal Medicine

## 2022-10-06 ENCOUNTER — Ambulatory Visit: Payer: Medicare Other | Admitting: Internal Medicine

## 2022-10-06 ENCOUNTER — Ambulatory Visit (INDEPENDENT_AMBULATORY_CARE_PROVIDER_SITE_OTHER): Payer: Medicare Other | Admitting: Internal Medicine

## 2022-10-06 ENCOUNTER — Encounter: Payer: Self-pay | Admitting: Internal Medicine

## 2022-10-06 VITALS — BP 126/84 | HR 104 | Temp 98.4°F | Resp 16 | Ht 67.0 in | Wt 189.5 lb

## 2022-10-06 DIAGNOSIS — F03B0 Unspecified dementia, moderate, without behavioral disturbance, psychotic disturbance, mood disturbance, and anxiety: Secondary | ICD-10-CM | POA: Diagnosis not present

## 2022-10-06 DIAGNOSIS — R269 Unspecified abnormalities of gait and mobility: Secondary | ICD-10-CM | POA: Diagnosis not present

## 2022-10-06 DIAGNOSIS — E785 Hyperlipidemia, unspecified: Secondary | ICD-10-CM | POA: Diagnosis not present

## 2022-10-06 DIAGNOSIS — E559 Vitamin D deficiency, unspecified: Secondary | ICD-10-CM | POA: Diagnosis not present

## 2022-10-06 DIAGNOSIS — I10 Essential (primary) hypertension: Secondary | ICD-10-CM | POA: Diagnosis not present

## 2022-10-06 NOTE — Assessment & Plan Note (Signed)
Patient is here by herself. We reach out to the patient's daughter and she reports that memory is definitely getting worse. They will be unable to afford higher level of care. The daughter is not sure what the patient is doing with her medications but is trying to find somebody to help.  Compliance: States she is only taking omeprazole and Effexor, although things "ran out". Referral sent for [facility], hopefully they will be able to help with medication compliance. Dementia: MMSE not performed today, but evidently memory is getting worse according to the patient's daughter. HTN: Supposedly on Procardia, compliance unclear.  Check a CBC High cholesterol: Supposed to be on simvastatin, compliance unclear.  Check AST ALT Depression: On Effexor, states she is doing well. Osteoporosis: On Prolia. Aortic stenosis: Denies chest pain, falls, last echo 2020.  Reassess on RTC Vitamin D deficiency: Rx ergocalciferol, recheck vitamin D. Gait disorder: Reports poor mobility without unusual aches or pains.  No falls.  Recommend a round of PT RTC 3 months

## 2022-10-06 NOTE — Patient Instructions (Addendum)
Vaccines I recommend: Covid booster RSV vaccine  Please bring [place] a copy of your Healthcare Power of Attorney for your chart.      GO TO THE LAB : Get the blood work     GO TO THE FRONT DESK, [place] Come back for checkup in 3 months

## 2022-10-06 NOTE — Progress Notes (Signed)
Subjective:    Patient ID: [name], female    DOB: 02/06/1936, 87 y.o.   MRN: [id number]  DOS:  10/06/2022 Type of visit - description: f/u, here by herself, history of dementia  Overall she feels okay although recognizes that moving and transferring is getting more difficult gradually. Denies chest pain, lower extremity edema or DOE. Occasionally gets slightly dizzy, typically immediately after she wakes up.  Symptoms last 10 minutes and then they go away.  States that emotionally is doing okay.  States she is taking "only 2 medications".   Review of Systems See above   Past Medical History:  Diagnosis Date   Aortic stenosis    ECHO 5-20ll mild AS but no evidence of HCM or MR--no further testing suggested    Blood transfusion without reported diagnosis 1971   after hysterectomy   Cancer ([place]) 2019   squamous cell carcinoma- on chest   Depression    GERD (gastroesophageal reflux disease)    History of gastroesophageal reflux (GERD)    Hx of adenomatous colonic polyps    Hx of cardiac cath 2006   neg   Hypertension    Mixed urge and stress incontinence    OAB (overactive bladder) 07/13/2012   Osteoarthritis    Osteopenia    rx fosamax 08-2013   Personal history of colonic polyps - adenomas 04/19/2014    Past Surgical History:  Procedure Laterality Date   ABDOMINAL HYSTERECTOMY  1971   APPENDECTOMY  1971   (at time of hyst?)   BLADDER SURGERY  2003   CATARACT EXTRACTION, BILATERAL     CHOLECYSTECTOMY     [place] Right    TOE SURGERY  1995   R 2nd --- correction of claw toe     Current Outpatient Medications  Medication Instructions   Ascorbic Acid (VITAMIN C PO) Oral, Daily   cholecalciferol (VITAMIN D3) 2,000 Units, Oral, Daily   Cinnamon 1,000 mg, Oral, 2 times daily   denosumab (PROLIA) 60 mg, Every 6 months   Iron-Vitamins (GERITOL PO) Oral, Daily   memantine (NAMENDA) 5 mg, Oral, 2 times daily   Multiple  Vitamins-Minerals (OCUVITE PO) 1 tablet, Oral, Daily,     Myrbetriq 25 mg, Oral, Daily   NIFEdipine (PROCARDIA XL/NIFEDICAL XL) 60 MG 24 hr tablet TAKE 1 TABLET BY MOUTH DAILY   omeprazole (PRILOSEC) 20 mg, Oral, 2 times daily before meals   simvastatin (ZOCOR) 20 MG tablet TAKE 1 TABLET BY MOUTH DAILY   sulfamethoxazole-trimethoprim (BACTRIM DS) 800-160 MG tablet 1 tablet, Oral, 2 times daily   venlafaxine XR (EFFEXOR-XR) 75 MG 24 hr capsule TAKE 1 CAPSULE BY MOUTH DAILY  WITH BREAKFAST   Vitamin D (Ergocalciferol) (DRISDOL) 50,000 Units, Oral, Every 7 days   vitamin E 600 Units, Oral, Daily,         Objective:   Physical Exam BP 126/84   Pulse (!) 104   Temp 98.4 F (36.9 C) (Oral)   Resp 16   Ht '5\' 7"'$  (1.702 m)   Wt 189 lb 8 oz (86 kg)   SpO2 98%   BMI 29.68 kg/m  General:   Well developed, NAD, BMI noted. HEENT:  Normocephalic . Face symmetric, atraumatic Lungs:  CTA B Normal respiratory effort, no intercostal retractions, no accessory muscle use. Heart: RRR, + systolic murmur.  Lower extremities: no pretibial edema bilaterally  Skin: Not pale. Not jaundice Neurologic:  alert & oriented to self, month, and  space. Speech normal, gait uses a walker. Psych--    Behavior appropriate. No anxious or depressed appearing.      Assessment     Assessment HTN Hyperlipidemia  GERD Depression Dementia: -sxs started shortly after she lost her husband.  MMSE 25 - DX 02/2020: MRI brain no acute, labs negative, Rx Namenda Osteoporosis:  T score plus ankle fracture 03-2019: t score 2008 -1.8, 2011 -1.8, 2014 -2.1: Fosamax Rx 08-2013 t score 03-2016: -2.1.  10/2020 Tscore  -2.7: Switch from Fosamax to Prolia H/o vit d def  DJD- Aortic stenosis ( at some point diagnosed with HOCM) ---echo 01-2010: "Mild AS but no evidence of HCM or MR.  ---Echo 6-15 , 05-2019 :stable mild AoS. Overactive bladder- pcp started to rx meds 09-2016  Ao [place]: wnl 02-2015 [facility]     PLAN: Patient is  here by herself. We reach out to the patient's daughter and she reports that memory is definitely getting worse. They will be unable to afford higher level of care. The daughter is not sure what the patient is doing with her medications but is trying to find somebody to help.  Compliance: States she is only taking omeprazole and Effexor, although things "ran out". Referral sent for [facility], hopefully they will be able to help with medication compliance. Dementia: MMSE not performed today, but evidently memory is getting worse according to the patient's daughter. HTN: Supposedly on Procardia, compliance unclear.  Check a CBC High cholesterol: Supposed to be on simvastatin, compliance unclear.  Check AST ALT Depression: On Effexor, states she is doing well. Osteoporosis: On Prolia. Aortic stenosis: Denies chest pain, falls, last echo 2020.  Reassess on RTC Vitamin D deficiency: Rx ergocalciferol, recheck vitamin D. Gait disorder: Reports poor mobility without unusual aches or pains.  No falls.  Recommend a round of PT RTC 3 months

## 2022-10-06 NOTE — Progress Notes (Signed)
Spoke w/ Lelon Frohlich- she definitely notices that Pt's memory is getting worse. They have looked into memory units and assisted living facilities but unfortunately they can't afford it. Pt is currently at independent living at [facility]. Ann currently doesn't know what Pt is doing with her medications, she has been trying to find someone to go and help give Pt her medications. Informed I'd add nursing to [facility] referral to see if they are able to come in and set up/help Pt's with meds. Made Ann aware that we are also setting up PT via [facility] for her there at home. Ann verbalized understanding.

## 2022-10-07 LAB — CBC WITH DIFFERENTIAL/PLATELET
Basophils Absolute: 0.1 10*3/uL (ref 0.0–0.1)
Basophils Relative: 1.1 % (ref 0.0–3.0)
Eosinophils Absolute: 0.1 10*3/uL (ref 0.0–0.7)
Eosinophils Relative: 1.9 % (ref 0.0–5.0)
HCT: 36.6 % (ref 36.0–46.0)
Hemoglobin: 12.3 g/dL (ref 12.0–15.0)
Lymphocytes Relative: 20.8 % (ref 12.0–46.0)
Lymphs Abs: 1.2 10*3/uL (ref 0.7–4.0)
MCHC: 33.6 g/dL (ref 30.0–36.0)
MCV: 91.3 fl (ref 78.0–100.0)
Monocytes Absolute: 0.5 10*3/uL (ref 0.1–1.0)
Monocytes Relative: 8.1 % (ref 3.0–12.0)
Neutro Abs: 3.9 10*3/uL (ref 1.4–7.7)
Neutrophils Relative %: 68.1 % (ref 43.0–77.0)
Platelets: 234 10*3/uL (ref 150.0–400.0)
RBC: 4 Mil/uL (ref 3.87–5.11)
RDW: 12.7 % (ref 11.5–15.5)
WBC: 5.7 10*3/uL (ref 4.0–10.5)

## 2022-10-07 LAB — ALT: ALT: 12 U/L (ref 0–35)

## 2022-10-07 LAB — VITAMIN D 25 HYDROXY (VIT D DEFICIENCY, FRACTURES): VITD: 24.84 ng/mL — ABNORMAL LOW (ref 30.00–100.00)

## 2022-10-07 LAB — T4, FREE: Free T4: 0.74 ng/dL (ref 0.60–1.60)

## 2022-10-07 LAB — AST: AST: 14 U/L (ref 0–37)

## 2022-10-07 LAB — TSH: TSH: 2.23 u[IU]/mL (ref 0.35–5.50)

## 2022-10-15 DIAGNOSIS — R2689 Other abnormalities of gait and mobility: Secondary | ICD-10-CM | POA: Diagnosis not present

## 2022-10-15 DIAGNOSIS — R279 Unspecified lack of coordination: Secondary | ICD-10-CM | POA: Diagnosis not present

## 2022-10-15 DIAGNOSIS — M6281 Muscle weakness (generalized): Secondary | ICD-10-CM | POA: Diagnosis not present

## 2022-10-20 ENCOUNTER — Telehealth: Payer: Self-pay

## 2022-10-20 NOTE — Telephone Encounter (Signed)
Plan of care received from [facility], signed and faxed back to [phone]. Form sent for scanning.

## 2022-10-21 DIAGNOSIS — M6281 Muscle weakness (generalized): Secondary | ICD-10-CM | POA: Diagnosis not present

## 2022-10-21 DIAGNOSIS — R2689 Other abnormalities of gait and mobility: Secondary | ICD-10-CM | POA: Diagnosis not present

## 2022-10-21 DIAGNOSIS — R279 Unspecified lack of coordination: Secondary | ICD-10-CM | POA: Diagnosis not present

## 2022-10-22 DIAGNOSIS — R279 Unspecified lack of coordination: Secondary | ICD-10-CM | POA: Diagnosis not present

## 2022-10-22 DIAGNOSIS — M6281 Muscle weakness (generalized): Secondary | ICD-10-CM | POA: Diagnosis not present

## 2022-10-22 DIAGNOSIS — R2689 Other abnormalities of gait and mobility: Secondary | ICD-10-CM | POA: Diagnosis not present

## 2022-10-23 DIAGNOSIS — R279 Unspecified lack of coordination: Secondary | ICD-10-CM | POA: Diagnosis not present

## 2022-10-23 DIAGNOSIS — M6281 Muscle weakness (generalized): Secondary | ICD-10-CM | POA: Diagnosis not present

## 2022-10-23 DIAGNOSIS — R2689 Other abnormalities of gait and mobility: Secondary | ICD-10-CM | POA: Diagnosis not present

## 2022-10-27 ENCOUNTER — Telehealth: Payer: Self-pay

## 2022-10-27 DIAGNOSIS — R2689 Other abnormalities of gait and mobility: Secondary | ICD-10-CM | POA: Diagnosis not present

## 2022-10-27 DIAGNOSIS — R488 Other symbolic dysfunctions: Secondary | ICD-10-CM | POA: Diagnosis not present

## 2022-10-27 DIAGNOSIS — R279 Unspecified lack of coordination: Secondary | ICD-10-CM | POA: Diagnosis not present

## 2022-10-27 DIAGNOSIS — M6281 Muscle weakness (generalized): Secondary | ICD-10-CM | POA: Diagnosis not present

## 2022-10-27 NOTE — Telephone Encounter (Signed)
Received orders from [employer] at [facility], form signed and faxed back to [phone]. Form sent for scanning.

## 2022-10-28 DIAGNOSIS — R2689 Other abnormalities of gait and mobility: Secondary | ICD-10-CM | POA: Diagnosis not present

## 2022-10-28 DIAGNOSIS — M6281 Muscle weakness (generalized): Secondary | ICD-10-CM | POA: Diagnosis not present

## 2022-10-28 DIAGNOSIS — R279 Unspecified lack of coordination: Secondary | ICD-10-CM | POA: Diagnosis not present

## 2022-10-29 DIAGNOSIS — R488 Other symbolic dysfunctions: Secondary | ICD-10-CM | POA: Diagnosis not present

## 2022-10-30 ENCOUNTER — Telehealth: Payer: Self-pay

## 2022-10-30 DIAGNOSIS — M6281 Muscle weakness (generalized): Secondary | ICD-10-CM | POA: Diagnosis not present

## 2022-10-30 DIAGNOSIS — R279 Unspecified lack of coordination: Secondary | ICD-10-CM | POA: Diagnosis not present

## 2022-10-30 DIAGNOSIS — R2689 Other abnormalities of gait and mobility: Secondary | ICD-10-CM | POA: Diagnosis not present

## 2022-10-30 NOTE — Telephone Encounter (Signed)
ST plan of care signed and faxed back to [place] at [phone]. Form sent for scanning.

## 2022-11-02 DIAGNOSIS — M6281 Muscle weakness (generalized): Secondary | ICD-10-CM | POA: Diagnosis not present

## 2022-11-02 DIAGNOSIS — R279 Unspecified lack of coordination: Secondary | ICD-10-CM | POA: Diagnosis not present

## 2022-11-02 DIAGNOSIS — R2689 Other abnormalities of gait and mobility: Secondary | ICD-10-CM | POA: Diagnosis not present

## 2022-11-03 DIAGNOSIS — R488 Other symbolic dysfunctions: Secondary | ICD-10-CM | POA: Diagnosis not present

## 2022-11-04 DIAGNOSIS — M6281 Muscle weakness (generalized): Secondary | ICD-10-CM | POA: Diagnosis not present

## 2022-11-04 DIAGNOSIS — R279 Unspecified lack of coordination: Secondary | ICD-10-CM | POA: Diagnosis not present

## 2022-11-04 DIAGNOSIS — R488 Other symbolic dysfunctions: Secondary | ICD-10-CM | POA: Diagnosis not present

## 2022-11-04 DIAGNOSIS — R2689 Other abnormalities of gait and mobility: Secondary | ICD-10-CM | POA: Diagnosis not present

## 2022-11-06 DIAGNOSIS — R2689 Other abnormalities of gait and mobility: Secondary | ICD-10-CM | POA: Diagnosis not present

## 2022-11-06 DIAGNOSIS — M6281 Muscle weakness (generalized): Secondary | ICD-10-CM | POA: Diagnosis not present

## 2022-11-06 DIAGNOSIS — R279 Unspecified lack of coordination: Secondary | ICD-10-CM | POA: Diagnosis not present

## 2022-11-10 DIAGNOSIS — M6281 Muscle weakness (generalized): Secondary | ICD-10-CM | POA: Diagnosis not present

## 2022-11-10 DIAGNOSIS — R488 Other symbolic dysfunctions: Secondary | ICD-10-CM | POA: Diagnosis not present

## 2022-11-10 DIAGNOSIS — R2689 Other abnormalities of gait and mobility: Secondary | ICD-10-CM | POA: Diagnosis not present

## 2022-11-10 DIAGNOSIS — R279 Unspecified lack of coordination: Secondary | ICD-10-CM | POA: Diagnosis not present

## 2022-11-11 DIAGNOSIS — R2689 Other abnormalities of gait and mobility: Secondary | ICD-10-CM | POA: Diagnosis not present

## 2022-11-11 DIAGNOSIS — M6281 Muscle weakness (generalized): Secondary | ICD-10-CM | POA: Diagnosis not present

## 2022-11-11 DIAGNOSIS — R279 Unspecified lack of coordination: Secondary | ICD-10-CM | POA: Diagnosis not present

## 2022-11-13 DIAGNOSIS — R2689 Other abnormalities of gait and mobility: Secondary | ICD-10-CM | POA: Diagnosis not present

## 2022-11-13 DIAGNOSIS — R279 Unspecified lack of coordination: Secondary | ICD-10-CM | POA: Diagnosis not present

## 2022-11-13 DIAGNOSIS — R488 Other symbolic dysfunctions: Secondary | ICD-10-CM | POA: Diagnosis not present

## 2022-11-13 DIAGNOSIS — M6281 Muscle weakness (generalized): Secondary | ICD-10-CM | POA: Diagnosis not present

## 2022-11-16 DIAGNOSIS — M6281 Muscle weakness (generalized): Secondary | ICD-10-CM | POA: Diagnosis not present

## 2022-11-16 DIAGNOSIS — R2689 Other abnormalities of gait and mobility: Secondary | ICD-10-CM | POA: Diagnosis not present

## 2022-11-16 DIAGNOSIS — R279 Unspecified lack of coordination: Secondary | ICD-10-CM | POA: Diagnosis not present

## 2022-11-17 DIAGNOSIS — R2689 Other abnormalities of gait and mobility: Secondary | ICD-10-CM | POA: Diagnosis not present

## 2022-11-17 DIAGNOSIS — R488 Other symbolic dysfunctions: Secondary | ICD-10-CM | POA: Diagnosis not present

## 2022-11-17 DIAGNOSIS — M6281 Muscle weakness (generalized): Secondary | ICD-10-CM | POA: Diagnosis not present

## 2022-11-17 DIAGNOSIS — R279 Unspecified lack of coordination: Secondary | ICD-10-CM | POA: Diagnosis not present

## 2022-11-20 DIAGNOSIS — R488 Other symbolic dysfunctions: Secondary | ICD-10-CM | POA: Diagnosis not present

## 2022-11-20 DIAGNOSIS — R279 Unspecified lack of coordination: Secondary | ICD-10-CM | POA: Diagnosis not present

## 2022-11-20 DIAGNOSIS — R2689 Other abnormalities of gait and mobility: Secondary | ICD-10-CM | POA: Diagnosis not present

## 2022-11-20 DIAGNOSIS — M6281 Muscle weakness (generalized): Secondary | ICD-10-CM | POA: Diagnosis not present

## 2022-11-24 DIAGNOSIS — R488 Other symbolic dysfunctions: Secondary | ICD-10-CM | POA: Diagnosis not present

## 2022-11-24 DIAGNOSIS — R279 Unspecified lack of coordination: Secondary | ICD-10-CM | POA: Diagnosis not present

## 2022-11-24 DIAGNOSIS — R2689 Other abnormalities of gait and mobility: Secondary | ICD-10-CM | POA: Diagnosis not present

## 2022-11-24 DIAGNOSIS — M6281 Muscle weakness (generalized): Secondary | ICD-10-CM | POA: Diagnosis not present

## 2022-11-26 DIAGNOSIS — R2689 Other abnormalities of gait and mobility: Secondary | ICD-10-CM | POA: Diagnosis not present

## 2022-11-26 DIAGNOSIS — R488 Other symbolic dysfunctions: Secondary | ICD-10-CM | POA: Diagnosis not present

## 2022-11-26 DIAGNOSIS — R279 Unspecified lack of coordination: Secondary | ICD-10-CM | POA: Diagnosis not present

## 2022-11-26 DIAGNOSIS — M6281 Muscle weakness (generalized): Secondary | ICD-10-CM | POA: Diagnosis not present

## 2022-11-30 ENCOUNTER — Other Ambulatory Visit: Payer: Self-pay | Admitting: Internal Medicine

## 2022-11-30 DIAGNOSIS — R279 Unspecified lack of coordination: Secondary | ICD-10-CM | POA: Diagnosis not present

## 2022-11-30 DIAGNOSIS — M6281 Muscle weakness (generalized): Secondary | ICD-10-CM | POA: Diagnosis not present

## 2022-11-30 DIAGNOSIS — R2689 Other abnormalities of gait and mobility: Secondary | ICD-10-CM | POA: Diagnosis not present

## 2022-12-01 DIAGNOSIS — R488 Other symbolic dysfunctions: Secondary | ICD-10-CM | POA: Diagnosis not present

## 2022-12-02 DIAGNOSIS — R2689 Other abnormalities of gait and mobility: Secondary | ICD-10-CM | POA: Diagnosis not present

## 2022-12-02 DIAGNOSIS — R279 Unspecified lack of coordination: Secondary | ICD-10-CM | POA: Diagnosis not present

## 2022-12-02 DIAGNOSIS — M6281 Muscle weakness (generalized): Secondary | ICD-10-CM | POA: Diagnosis not present

## 2022-12-03 DIAGNOSIS — R488 Other symbolic dysfunctions: Secondary | ICD-10-CM | POA: Diagnosis not present

## 2022-12-06 ENCOUNTER — Other Ambulatory Visit: Payer: Self-pay | Admitting: Internal Medicine

## 2022-12-08 DIAGNOSIS — R2689 Other abnormalities of gait and mobility: Secondary | ICD-10-CM | POA: Diagnosis not present

## 2022-12-08 DIAGNOSIS — R279 Unspecified lack of coordination: Secondary | ICD-10-CM | POA: Diagnosis not present

## 2022-12-08 DIAGNOSIS — M6281 Muscle weakness (generalized): Secondary | ICD-10-CM | POA: Diagnosis not present

## 2022-12-09 DIAGNOSIS — R279 Unspecified lack of coordination: Secondary | ICD-10-CM | POA: Diagnosis not present

## 2022-12-09 DIAGNOSIS — R2689 Other abnormalities of gait and mobility: Secondary | ICD-10-CM | POA: Diagnosis not present

## 2022-12-09 DIAGNOSIS — M6281 Muscle weakness (generalized): Secondary | ICD-10-CM | POA: Diagnosis not present

## 2022-12-10 DIAGNOSIS — R488 Other symbolic dysfunctions: Secondary | ICD-10-CM | POA: Diagnosis not present

## 2023-01-05 ENCOUNTER — Ambulatory Visit (INDEPENDENT_AMBULATORY_CARE_PROVIDER_SITE_OTHER): Payer: Medicare Other | Admitting: Internal Medicine

## 2023-01-05 ENCOUNTER — Encounter: Payer: Self-pay | Admitting: Internal Medicine

## 2023-01-05 VITALS — BP 134/86 | HR 92 | Temp 97.8°F | Resp 16 | Ht 67.0 in | Wt 185.1 lb

## 2023-01-05 DIAGNOSIS — F03B0 Unspecified dementia, moderate, without behavioral disturbance, psychotic disturbance, mood disturbance, and anxiety: Secondary | ICD-10-CM | POA: Diagnosis not present

## 2023-01-05 DIAGNOSIS — I350 Nonrheumatic aortic (valve) stenosis: Secondary | ICD-10-CM

## 2023-01-05 DIAGNOSIS — M810 Age-related osteoporosis without current pathological fracture: Secondary | ICD-10-CM

## 2023-01-05 NOTE — Patient Instructions (Addendum)
It was good to see you both today.  Ann, please check what medications your mom is taking and let us know. Please see the list of the medications she is supposed to be taking.       GO TO THE FRONT DESK, PLEASE SCHEDULE YOUR APPOINTMENTS Come back for checkup in 3 months    Vaccines I recommend: Covid booster RSV vaccine   Please bring [place] a copy of your Healthcare Power of Attorney for your chart.

## 2023-01-05 NOTE — Progress Notes (Unsigned)
Subjective:    Patient ID: [name], female    DOB: 07-22-1936, 87 y.o.   MRN: [id number]  DOS:  01/05/2023 Type of visit - description: Follow-up, here with her daughter  Since the last office visit, things are stable. Denies chest pain or difficulty breathing with ADLs.  Review of Systems See above   Past Medical History:  Diagnosis Date   Aortic stenosis    ECHO 5-20ll mild AS but no evidence of HCM or MR--no further testing suggested    Blood transfusion without reported diagnosis 1971   after hysterectomy   Cancer 2019   squamous cell carcinoma- on chest   Depression    GERD (gastroesophageal reflux disease)    History of gastroesophageal reflux (GERD)    Hx of adenomatous colonic polyps    Hx of cardiac cath 2006   neg   Hypertension    Mixed urge and stress incontinence    OAB (overactive bladder) 07/13/2012   Osteoarthritis    Osteopenia    rx fosamax 08-2013   Personal history of colonic polyps - adenomas 04/19/2014    Past Surgical History:  Procedure Laterality Date   ABDOMINAL HYSTERECTOMY  1971   APPENDECTOMY  1971   (at time of hyst?)   BLADDER SURGERY  2003   CATARACT EXTRACTION, BILATERAL     CHOLECYSTECTOMY     OOPHORECTOMY  1971   ROTATOR CUFF REPAIR Right    TOE SURGERY  1995   R 2nd --- correction of claw toe     Current Outpatient Medications  Medication Instructions   Ascorbic Acid (VITAMIN C PO) Oral, Daily   cholecalciferol (VITAMIN D3) 2,000 Units, Oral, Daily   Cinnamon 1,000 mg, Oral, 2 times daily   denosumab (PROLIA) 60 mg, Every 6 months   Iron-Vitamins (GERITOL PO) Oral, Daily   memantine (NAMENDA) 5 mg, Oral, 2 times daily   Multiple Vitamins-Minerals (OCUVITE PO) 1 tablet, Oral, Daily,     Myrbetriq 25 mg, Oral, Daily   NIFEdipine (PROCARDIA XL/NIFEDICAL XL) 60 MG 24 hr tablet TAKE 1 TABLET BY MOUTH DAILY   omeprazole (PRILOSEC) 20 mg, Oral, 2 times daily before meals   simvastatin (ZOCOR) 20 MG tablet TAKE 1 TABLET BY  MOUTH DAILY   venlafaxine XR (EFFEXOR-XR) 75 mg, Oral, Daily with breakfast   vitamin E 600 Units, Daily       Objective:   Physical Exam BP 134/86   Pulse 92   Temp 97.8 F (36.6 C) (Oral)   Resp 16   Ht  (1.702 m)   Wt 185 lb 2 oz (84 kg)   SpO2 98%   BMI 28.99 kg/m  General:   Well developed, NAD, BMI noted. HEENT:  Normocephalic . Face symmetric, atraumatic Lungs:  CTA B Normal respiratory effort, no intercostal retractions, no accessory muscle use. Heart: + Systolic murmur Lower extremities: + Pretibial and periankle edema, symmetric.  Calves nontender Skin: Not pale. Not jaundice Neurologic:  alert & oriented self, space, not completely oriented on time.  Recognizes her daughter.  Speech normal, gait assisted by a walker. Psych--  Cognition and judgment appear intact.  Cooperative with normal attention span and concentration.  Behavior appropriate. No anxious or depressed appearing.      Assessment     Assessment HTN Hyperlipidemia  GERD Depression Dementia: -sxs started shortly after she lost her husband.  MMSE 25 - DX 02/2020: MRI brain no acute, labs negative, Rx Namenda Osteoporosis:  T score plus  ankle fracture 03-2019: t score 2008 -1.8, 2011 -1.8, 2014 -2.1: Fosamax Rx 08-2013 t score 03-2016: -2.1.  10/2020 Tscore  -2.7: Switch from Fosamax to Prolia H/o vit d def  DJD- Aortic stenosis ( at some point diagnosed with HOCM) ---echo 01-2010: "Mild AS but no evidence of HCM or MR.  ---Echo 6-15 , 05-2019 :stable mild AoS. Overactive bladder- pcp started to rx meds 09-2016  Ao [place]: wnl 02-2015 [facility]     PLAN: Compliance: Remains the main issue, the patient states she is only taking 2 medications and a lot of supplements. Dewayne Hatch -daughter-  so far has been unable to help her with compliance. We agreed that she will review what medications she takes and subsequently let me know. For now we will not introduce new medications. Dementia: Overall report  memory is stable on Namenda, we talk about adding Aricept but again will not introduce new medications at this point. Osteoporosis: Unable to afford Prolia, we might need to go back on Fosamax.  Reassess on RTC Aortic stenosis: No problems doing the ADLs.  Observation. Depression: Supposedly on Effexor.  States doing well. RTC 3 months

## 2023-01-06 NOTE — Assessment & Plan Note (Signed)
Compliance: Remains the main issue, the patient states she is only taking 2 medications and a lot of supplements. Dewayne Hatch -daughter-  so far has been unable to help her with compliance. We agreed that she will review what medications she takes and subsequently let me know. For now we will not introduce new medications. Dementia: Overall report memory is stable on Namenda, we talk about adding Aricept but again will not introduce new medications at this point. Osteoporosis: Unable to afford Prolia, we might need to go back on Fosamax.  Reassess on RTC Aortic stenosis: No problems doing the ADLs.  Observation. Depression: Supposedly on Effexor.  States doing well. RTC 3 months

## 2023-01-12 ENCOUNTER — Telehealth: Payer: Self-pay

## 2023-01-12 NOTE — Telephone Encounter (Signed)
Spoke w/ Dewayne Hatch- Pt's daughter- she called to go over Pt's medications:   Currently the only prescription Pt is taking is:  Omeprazole 20mg  bid- has really been taking 2 bid- 4 total  Supplement-  Vitamin D3 1000 units 1 capsule bid Cinnamon 1000mg  1 AM Apple Cidar Vinegar - 1 in AM Hair, Skin, Nails- 1 tablet in AM Beet Root 1000mg  1 tablet AM  Ann states she will start going to do Pt's medications once weekly into container for Pt. Wants to know how she needs to have Pt restart her other prescription medications.

## 2023-01-12 NOTE — Telephone Encounter (Signed)
Instruction: - Decrease omeprazole to 20 mg twice daily only - Restart simvastatin - Restart Namenda - Will not restart nifedipine, Effexor since she seems to be doing well - edit  medication list.

## 2023-01-13 MED ORDER — MEMANTINE HCL 5 MG PO TABS: 5.0000 mg | ORAL_TABLET | Freq: Two times a day (BID) | ORAL | 1 refills | Status: DC

## 2023-01-13 MED ORDER — MEMANTINE HCL 5 MG PO TABS: 5.0000 mg | ORAL_TABLET | Freq: Two times a day (BID) | ORAL | 0 refills | Status: DC

## 2023-01-13 MED ORDER — SIMVASTATIN 20 MG PO TABS: 20.0000 mg | ORAL_TABLET | Freq: Every day | ORAL | 0 refills | Status: DC

## 2023-01-13 MED ORDER — SIMVASTATIN 20 MG PO TABS: 20.0000 mg | ORAL_TABLET | Freq: Every day | ORAL | 1 refills | Status: DC

## 2023-01-13 NOTE — Telephone Encounter (Signed)
Spoke w/ Pt's daughterDewayne Hatch- informed of recommendations. Ann verbalized understanding. She requested meds to be refilled for 90 days to mail order and a 15 day supply sent to [facility] in [place], [place].

## 2023-02-05 ENCOUNTER — Encounter: Payer: Self-pay | Admitting: Family

## 2023-02-05 ENCOUNTER — Other Ambulatory Visit (HOSPITAL_BASED_OUTPATIENT_CLINIC_OR_DEPARTMENT_OTHER): Payer: Self-pay

## 2023-02-05 ENCOUNTER — Ambulatory Visit (INDEPENDENT_AMBULATORY_CARE_PROVIDER_SITE_OTHER): Payer: Medicare Other | Admitting: Family

## 2023-02-05 VITALS — BP 136/78 | HR 88 | Ht 67.0 in | Wt 186.8 lb

## 2023-02-05 DIAGNOSIS — R410 Disorientation, unspecified: Secondary | ICD-10-CM | POA: Diagnosis not present

## 2023-02-05 DIAGNOSIS — R300 Dysuria: Secondary | ICD-10-CM | POA: Diagnosis not present

## 2023-02-05 LAB — POCT URINALYSIS DIPSTICK
Bilirubin, UA: NEGATIVE
Blood, UA: NEGATIVE
Glucose, UA: NEGATIVE
Ketones, UA: NEGATIVE
Nitrite, UA: NEGATIVE
Protein, UA: POSITIVE — AB
Spec Grav, UA: 1.01 (ref 1.010–1.025)
Urobilinogen, UA: 0.2 E.U./dL
pH, UA: 5 (ref 5.0–8.0)

## 2023-02-05 MED ORDER — SULFAMETHOXAZOLE-TRIMETHOPRIM 800-160 MG PO TABS
1.0000 | ORAL_TABLET | Freq: Two times a day (BID) | ORAL | 0 refills | Status: DC
  Filled 2023-02-05: qty 10, 5d supply, fill #0

## 2023-02-05 NOTE — Progress Notes (Signed)
[name] is a 87 y.o. female with the following history as recorded in EpicCare:  Patient Active Problem List   Diagnosis Date Noted   Dementia (HCC) 02/23/2021   Closed fracture of left distal radius 07/31/2019   Closed nondisplaced fracture of lateral malleolus of right fibula 01/17/2019   PCP NOTES >>>>>>>>>>>>>>>> 09/03/2015   Personal history of colonic polyps - adenomas 04/19/2014   OAB (overactive bladder) 07/13/2012   Annual physical exam 12/17/2010   Aortic stenosis 01/01/2010   Hyperlipidemia 11/21/2008   GERD 11/28/2007   Depression 06/24/2007   Essential hypertension 06/24/2007   OSTEOARTHRITIS 06/24/2007   Osteoporosis 06/24/2007    Current Outpatient Medications  Medication Sig Dispense Refill   APPLE CIDER VINEGAR PO Take 1 tablet by mouth daily.     cholecalciferol (VITAMIN D3) 25 MCG (1000 UNIT) tablet Take 1,000 Units by mouth in the morning and at bedtime.     Cinnamon 500 MG capsule Take 1,000 mg by mouth daily.     memantine (NAMENDA) 5 MG tablet Take 1 tablet (5 mg total) by mouth 2 (two) times daily. 180 tablet 1   Misc Natural Products (BEET ROOT) 500 MG CAPS Take 1,000 mg by mouth daily.     Multiple Vitamins-Minerals (HAIR SKIN & NAILS PO) Take 1 tablet by mouth daily.     omeprazole (PRILOSEC) 20 MG capsule Take 1 capsule (20 mg total) by mouth 2 (two) times daily before a meal.     simvastatin (ZOCOR) 20 MG tablet Take 1 tablet (20 mg total) by mouth daily. 15 tablet 0   sulfamethoxazole-trimethoprim (BACTRIM DS) 800-160 MG tablet Take 1 tablet by mouth 2 (two) times daily. 10 tablet 0   No current facility-administered medications for this visit.    Allergies: Patient has no known allergies.  Past Medical History:  Diagnosis Date   Aortic stenosis    ECHO 5-20ll mild AS but no evidence of HCM or MR--no further testing suggested    Blood transfusion without reported diagnosis 1971   after hysterectomy   Cancer (HCC) 2019   squamous cell  carcinoma- on chest   Depression    GERD (gastroesophageal reflux disease)    History of gastroesophageal reflux (GERD)    Hx of adenomatous colonic polyps    Hx of cardiac cath 2006   neg   Hypertension    Mixed urge and stress incontinence    OAB (overactive bladder) 07/13/2012   Osteoarthritis    Osteopenia    rx fosamax 08-2013   Personal history of colonic polyps - adenomas 04/19/2014    Past Surgical History:  Procedure Laterality Date   ABDOMINAL HYSTERECTOMY  1971   APPENDECTOMY  1971   (at time of hyst?)   BLADDER SURGERY  2003   CATARACT EXTRACTION, BILATERAL     CHOLECYSTECTOMY     OOPHORECTOMY  1971   ROTATOR CUFF REPAIR Right    TOE SURGERY  1995   R 2nd --- correction of claw toe     Family History  Problem Relation Age of Onset   Coronary artery disease Father 54       had CABG   Cancer Father        gallbladder   Stroke Mother 55   Ovarian cancer Sister 56   Colon cancer Neg Hx        NEGATIVE   Breast cancer Neg Hx        NEGATIVE    Social History   Tobacco  Use   Smoking status: Never   Smokeless tobacco: Never  Substance Use Topics   Alcohol use: Not Currently    Comment: wine rarely    Subjective:   Presents with her daughter today; is concerned for UTI- increased confusion is presenting symptom; per daughter, symptoms present x 2 weeks;   Objective:  Vitals:   02/05/23 1338  BP: 136/78  Pulse: 88  SpO2: 98%  Weight: 186 lb 12.8 oz (84.7 kg)  Height: 5\' 7"  (1.702 m)    General: Well developed, well nourished, in no acute distress  Skin : Warm and dry.  Head: Normocephalic and atraumatic  Lungs: Respirations unlabored; clear to auscultation bilaterally without wheeze, rales, rhonchi  CVS exam: normal rate and regular rhythm.  Neurologic: Alert and oriented; speech intact; face symmetrical; moves all extremities well; CNII-XII intact without focal deficit   Assessment:  1. Dysuria   2. Confusion     Plan:  Check U/A and urine  culture; Rx for Bactrim DS bid x 5 days; follow up to be determined;   No follow-ups on file.  Orders Placed This Encounter  Procedures   Urine Culture   POCT Urinalysis Dipstick    Requested Prescriptions   Signed Prescriptions Disp Refills   sulfamethoxazole-trimethoprim (BACTRIM DS) 800-160 MG tablet 10 tablet 0    Sig: Take 1 tablet by mouth 2 (two) times daily.

## 2023-02-06 LAB — URINE CULTURE
MICRO NUMBER:: 15001165
SPECIMEN QUALITY:: ADEQUATE

## 2023-02-14 ENCOUNTER — Other Ambulatory Visit: Payer: Self-pay | Admitting: Internal Medicine

## 2023-03-22 ENCOUNTER — Other Ambulatory Visit: Payer: Self-pay | Admitting: Internal Medicine

## 2023-03-23 ENCOUNTER — Other Ambulatory Visit: Payer: Self-pay | Admitting: Internal Medicine

## 2023-04-06 ENCOUNTER — Encounter: Payer: Self-pay | Admitting: Internal Medicine

## 2023-04-06 ENCOUNTER — Telehealth: Payer: Self-pay | Admitting: Internal Medicine

## 2023-04-06 ENCOUNTER — Ambulatory Visit (INDEPENDENT_AMBULATORY_CARE_PROVIDER_SITE_OTHER): Payer: Medicare Other | Admitting: Internal Medicine

## 2023-04-06 VITALS — BP 132/78 | HR 76 | Temp 98.1°F | Resp 18 | Ht 67.0 in | Wt 189.2 lb

## 2023-04-06 DIAGNOSIS — M810 Age-related osteoporosis without current pathological fracture: Secondary | ICD-10-CM | POA: Diagnosis not present

## 2023-04-06 DIAGNOSIS — R739 Hyperglycemia, unspecified: Secondary | ICD-10-CM

## 2023-04-06 DIAGNOSIS — E785 Hyperlipidemia, unspecified: Secondary | ICD-10-CM | POA: Diagnosis not present

## 2023-04-06 DIAGNOSIS — I10 Essential (primary) hypertension: Secondary | ICD-10-CM

## 2023-04-06 DIAGNOSIS — F03B0 Unspecified dementia, moderate, without behavioral disturbance, psychotic disturbance, mood disturbance, and anxiety: Secondary | ICD-10-CM | POA: Diagnosis not present

## 2023-04-06 DIAGNOSIS — E559 Vitamin D deficiency, unspecified: Secondary | ICD-10-CM

## 2023-04-06 DIAGNOSIS — Z0000 Encounter for general adult medical examination without abnormal findings: Secondary | ICD-10-CM

## 2023-04-06 MED ORDER — ALENDRONATE SODIUM 70 MG PO TABS: 70.0000 mg | ORAL_TABLET | ORAL | 3 refills | Status: DC

## 2023-04-06 NOTE — Telephone Encounter (Signed)
Please advise 

## 2023-04-06 NOTE — Patient Instructions (Addendum)
Continue the same medications.  Add Fosamax 1 tablet weekly.  Take it on an empty stomach and do not eat or lay back for 45 minutes.  Vaccines I recommend: Covid booster RSV vaccine Flu shot this fall     GO TO THE FRONT DESK, PLEASE SCHEDULE YOUR APPOINTMENTS Come back for a physical exam in 3 months, fasting   Please send or bring [place] a copy of your Healthcare Power of Attorney for your chart.

## 2023-04-06 NOTE — Telephone Encounter (Signed)
Pt's physical is 07/12/23 at 1:40 pm but she would like to come in the week prior to have her labs drawn so she does not have to fast that long. Please advise when labs are placed so pt can be scheduled.

## 2023-04-06 NOTE — Addendum Note (Signed)
Addended byConrad Gadsden D on: 04/06/2023 12:40 PM   Modules accepted: Orders

## 2023-04-06 NOTE — Telephone Encounter (Signed)
Orders placed. Please schedule lab appt several days prior to cpx appt. Thank you.

## 2023-04-06 NOTE — Progress Notes (Unsigned)
Subjective:    Patient ID: [name], female    DOB: 02-25-1936, 87 y.o.   MRN: [id number]  DOS:  04/06/2023 Type of visit - description: f/u  Since the last office visit she is feeling good. No chest pain or difficulty breathing. No anxiety or depression.   Review of Systems See above   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   Depression    GERD (gastroesophageal reflux disease)    History of gastroesophageal reflux (GERD)    Hx of adenomatous colonic polyps    Hx of cardiac cath 2006   neg   Hypertension    Mixed urge and stress incontinence    OAB (overactive bladder) 07/13/2012   Osteoarthritis    Osteopenia    rx fosamax 08-2013   Personal history of colonic polyps - adenomas 04/19/2014    Past Surgical History:  Procedure Laterality Date   ABDOMINAL HYSTERECTOMY  1971   APPENDECTOMY  1971   (at time of hyst?)   BLADDER SURGERY  2003   CATARACT EXTRACTION, BILATERAL     CHOLECYSTECTOMY     OOPHORECTOMY  1971   ROTATOR CUFF REPAIR Right    TOE SURGERY  1995   R 2nd --- correction of claw toe     Current Outpatient Medications  Medication Instructions   APPLE CIDER VINEGAR PO 1 tablet, Oral, Daily   Beet Root 1,000 mg, Oral, Daily   cholecalciferol (VITAMIN D3) 1,000 Units, Oral, 2 times daily   Cinnamon 1,000 mg, Oral, Daily   memantine (NAMENDA) 5 mg, Oral, 2 times daily   Multiple Vitamins-Minerals (HAIR SKIN & NAILS PO) 1 tablet, Oral, Daily   omeprazole (PRILOSEC) 20 MG capsule TAKE 1 CAPSULE BY MOUTH TWICE  DAILY BEFORE A MEAL   simvastatin (ZOCOR) 20 mg, Oral, Daily   sulfamethoxazole-trimethoprim (BACTRIM DS) 800-160 MG tablet 1 tablet, Oral, 2 times daily       Objective:   Physical Exam BP 132/78   Pulse 76    Temp 98.1 F (36.7 C) (Oral)   Resp 18   Ht 5\' 7"  (1.702 m)   Wt 189 lb 4 oz (85.8 kg)   SpO2 96%   BMI 29.64 kg/m  General:   Well developed, NAD, BMI noted. HEENT:  Normocephalic . Face symmetric, atraumatic Lungs:  CTA B Normal respiratory effort, no intercostal retractions, no accessory muscle use. Heart: RRR, + systolic murmur.  Lower extremities: no pretibial edema bilaterally  Skin: Not pale. Not jaundice Neurologic:  alert & oriented X3.  Gait assisted by cane today.   Psych--  Behavior appropriate. No anxious or depressed appearing.      Assessment     Assessment HTN Hyperlipidemia  GERD Depression Dementia: -sxs started shortly after she lost her husband.  MMSE 25 - DX 02/2020: MRI brain no acute, labs negative, Rx Namenda Osteoporosis:  T score plus ankle fracture 03-2019: t score 2008 -1.8, 2011 -1.8, 2014 -2.1: Fosamax Rx 08-2013 t score 03-2016: -2.1.  10/2020 Tscore  -2.7: Switch from Fosamax to Prolia 03-2023 H/o vit d def  DJD- Aortic stenosis ( at some point diagnosed with HOCM) ---echo 01-2010: "Mild AS but no evidence of HCM or MR.  ---Echo 6-15 ,  05-2019 :stable mild AoS. Overactive bladder- pcp started to rx meds 09-2016  Ao [place]: wnl 02-2015 [facility]     PLAN: Compliance: See phone note from 01/12/2023.  Medication reconciliation was performed. Dementia: Was not taking  namenda, was rec  to restart.  Good compliance High cholesterol: Was not taking simvastatin, was rec  to restart.  Labs on RTC Depression: Not taking Effexor.  Doing well, no need for antidepressant at this point. HTN: BP looks very good, not taking nifedipine, I do not see a need to restart Osteoporosis: She took Fosamax intermittently for few years, based on bone density test from 10/2020 was switched to Prolia, got only 2 or 3 shots and as of 03-2023 is not able to afford it. Plan: Restart Fosamax, daughter can help [name], she is very familiar with the medication. UTI: Recently Dx  with a UTI, now asymptomatic. RTC CPX 4 months.

## 2023-04-06 NOTE — Telephone Encounter (Signed)
CMP FLP CBC A1c vitamin D

## 2023-04-07 NOTE — Assessment & Plan Note (Signed)
Compliance: See phone note from 01/12/2023.  Medication reconciliation was performed. Dementia: Was not taking  namenda, was rec  to restart.  Good compliance High cholesterol: Was not taking simvastatin, was rec  to restart.  Labs on RTC Depression: Not taking Effexor.  Doing well, no need for antidepressant at this point. HTN: BP looks very good, not taking nifedipine, I do not see a need to restart Osteoporosis: She took Fosamax intermittently for few years, based on bone density test from 10/2020 was switched to Prolia, got only 2 or 3 shots and as of 03-2023 is not able to afford it. Plan: Restart Fosamax, daughter can help [name], she is very familiar with the medication. UTI: Recently Dx with a UTI, now asymptomatic. RTC CPX 4 months.

## 2023-04-07 NOTE — Telephone Encounter (Signed)
Lvm to advise lab orders have been placed and we can schedule pt's lab appt to be done prior to her physical

## 2023-04-14 ENCOUNTER — Encounter (INDEPENDENT_AMBULATORY_CARE_PROVIDER_SITE_OTHER): Payer: Self-pay

## 2023-04-22 DIAGNOSIS — H00021 Hordeolum internum right upper eyelid: Secondary | ICD-10-CM | POA: Diagnosis not present

## 2023-04-22 DIAGNOSIS — H02841 Edema of right upper eyelid: Secondary | ICD-10-CM | POA: Diagnosis not present

## 2023-05-06 DIAGNOSIS — H00021 Hordeolum internum right upper eyelid: Secondary | ICD-10-CM | POA: Diagnosis not present

## 2023-05-14 NOTE — Progress Notes (Signed)
Pt did not answer phone for AWV.   This encounter was created in error - please disregard.

## 2023-05-28 ENCOUNTER — Other Ambulatory Visit: Payer: Self-pay | Admitting: Internal Medicine

## 2023-05-31 DIAGNOSIS — H00021 Hordeolum internum right upper eyelid: Secondary | ICD-10-CM | POA: Diagnosis not present

## 2023-06-08 ENCOUNTER — Other Ambulatory Visit: Payer: Self-pay | Admitting: Internal Medicine

## 2023-06-15 ENCOUNTER — Ambulatory Visit (INDEPENDENT_AMBULATORY_CARE_PROVIDER_SITE_OTHER): Payer: Medicare Other | Admitting: Family

## 2023-06-15 ENCOUNTER — Other Ambulatory Visit (HOSPITAL_BASED_OUTPATIENT_CLINIC_OR_DEPARTMENT_OTHER): Payer: Self-pay

## 2023-06-15 ENCOUNTER — Other Ambulatory Visit (INDEPENDENT_AMBULATORY_CARE_PROVIDER_SITE_OTHER): Payer: Medicare Other

## 2023-06-15 VITALS — BP 152/84 | HR 84 | Temp 98.2°F | Resp 16

## 2023-06-15 DIAGNOSIS — E785 Hyperlipidemia, unspecified: Secondary | ICD-10-CM

## 2023-06-15 DIAGNOSIS — Z87440 Personal history of urinary (tract) infections: Secondary | ICD-10-CM | POA: Diagnosis not present

## 2023-06-15 DIAGNOSIS — Z23 Encounter for immunization: Secondary | ICD-10-CM | POA: Diagnosis not present

## 2023-06-15 DIAGNOSIS — I10 Essential (primary) hypertension: Secondary | ICD-10-CM | POA: Diagnosis not present

## 2023-06-15 DIAGNOSIS — M25561 Pain in right knee: Secondary | ICD-10-CM | POA: Diagnosis not present

## 2023-06-15 DIAGNOSIS — F03B0 Unspecified dementia, moderate, without behavioral disturbance, psychotic disturbance, mood disturbance, and anxiety: Secondary | ICD-10-CM

## 2023-06-15 DIAGNOSIS — E559 Vitamin D deficiency, unspecified: Secondary | ICD-10-CM | POA: Insufficient documentation

## 2023-06-15 DIAGNOSIS — M25562 Pain in left knee: Secondary | ICD-10-CM

## 2023-06-15 DIAGNOSIS — R739 Hyperglycemia, unspecified: Secondary | ICD-10-CM

## 2023-06-15 DIAGNOSIS — L989 Disorder of the skin and subcutaneous tissue, unspecified: Secondary | ICD-10-CM

## 2023-06-15 LAB — COMPREHENSIVE METABOLIC PANEL
ALT: 16 U/L (ref 0–35)
AST: 16 U/L (ref 0–37)
Albumin: 4.5 g/dL (ref 3.5–5.2)
Alkaline Phosphatase: 55 U/L (ref 39–117)
BUN: 26 mg/dL — ABNORMAL HIGH (ref 6–23)
CO2: 26 meq/L (ref 19–32)
Calcium: 9.9 mg/dL (ref 8.4–10.5)
Chloride: 104 meq/L (ref 96–112)
Creatinine, Ser: 0.93 mg/dL (ref 0.40–1.20)
GFR: 56.14 mL/min — ABNORMAL LOW (ref >60.00)
Glucose, Bld: 90 mg/dL (ref 70–99)
Potassium: 4.1 meq/L (ref 3.5–5.1)
Sodium: 140 meq/L (ref 135–145)
Total Bilirubin: 0.6 mg/dL (ref 0.2–1.2)
Total Protein: 6.9 g/dL (ref 6.0–8.3)

## 2023-06-15 LAB — CBC WITH DIFFERENTIAL/PLATELET
Basophils Absolute: 0.1 10*3/uL (ref 0.0–0.1)
Basophils Relative: 1.2 % (ref 0.0–3.0)
Eosinophils Absolute: 0.1 10*3/uL (ref 0.0–0.7)
Eosinophils Relative: 3 % (ref 0.0–5.0)
HCT: 37.6 % (ref 36.0–46.0)
Hemoglobin: 12.1 g/dL (ref 12.0–15.0)
Lymphocytes Relative: 32.9 % (ref 12.0–46.0)
Lymphs Abs: 1.6 10*3/uL (ref 0.7–4.0)
MCHC: 32 g/dL (ref 30.0–36.0)
MCV: 93.1 fL (ref 78.0–100.0)
Monocytes Absolute: 0.5 10*3/uL (ref 0.1–1.0)
Monocytes Relative: 9.8 % (ref 3.0–12.0)
Neutro Abs: 2.6 10*3/uL (ref 1.4–7.7)
Neutrophils Relative %: 53.1 % (ref 43.0–77.0)
Platelets: 236 10*3/uL (ref 150.0–400.0)
RBC: 4.04 Mil/uL (ref 3.87–5.11)
RDW: 13.2 % (ref 11.5–15.5)
WBC: 4.8 10*3/uL (ref 4.0–10.5)

## 2023-06-15 LAB — LIPID PANEL
Cholesterol: 159 mg/dL (ref 0–200)
HDL: 59 mg/dL (ref >39.00)
LDL Cholesterol: 65 mg/dL (ref 0–99)
NonHDL: 99.52
Total CHOL/HDL Ratio: 3
Triglycerides: 173 mg/dL — ABNORMAL HIGH (ref 0.0–149.0)
VLDL: 34.6 mg/dL (ref 0.0–40.0)

## 2023-06-15 LAB — VITAMIN D 25 HYDROXY (VIT D DEFICIENCY, FRACTURES): VITD: 28.01 ng/mL — ABNORMAL LOW (ref 30.00–100.00)

## 2023-06-15 LAB — HEMOGLOBIN A1C: Hgb A1c MFr Bld: 5.7 % (ref 4.6–6.5)

## 2023-06-15 MED ORDER — DONEPEZIL HCL 5 MG PO TABS
5.0000 mg | ORAL_TABLET | Freq: Every day | ORAL | 0 refills | Status: DC
Start: 2023-06-15 — End: 2023-12-28
  Filled 2023-06-15: qty 30, 30d supply, fill #0

## 2023-06-15 NOTE — Assessment & Plan Note (Signed)
BP mildly elevated. Not currently on antihypertensive.  Due to recent fall will not initiate bp medication.  Monitor for now.

## 2023-06-15 NOTE — Progress Notes (Signed)
Subjective:     Patient ID: [name], female    DOB: 1935/10/03, 87 y.o.   MRN: [id number]  Chief Complaint  Patient presents with   Knee Pain    Complains of bilateral knee pain   Back Pain    Complains of back pain    HPI  Discussed the use of AI scribe software for clinical note transcription with the patient, who gave verbal consent to proceed.  History of Present Illness   The patient, who lives in an independent senior living facility, presents with worsening bilateral knee pain. She presents with her daughter today who reports that she had a recent fall at the facility but did not have any injury from the fall.  The patient has a history of dementia and daughter notes a decline in her memory over the last few months.    The patient's knee pain has been progressively worsening with age. The patient recently had a fall, but did not seek medical attention post the incident. The patient denies any new pain or discomfort following the fall. The patient's daughter also reports that the patient has been complaining of increased pain in her knees and back. The patient has been on Namenda for dementia and Simvastatin for cholesterol. The patient was recently prescribed Fosamax, but stopped taking it due to confusion.     BP Readings from Last 3 Encounters:  06/15/23 (!) 152/84  04/06/23 132/78  02/05/23 136/78        Health Maintenance Due  Topic Date Due   Medicare Annual Wellness (AWV)  05/12/2023   COVID-19 Vaccine (6 - 2023-24 season) 05/16/2023    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   Depression    GERD (gastroesophageal reflux disease)    History of gastroesophageal reflux (GERD)    Hx of adenomatous colonic polyps    Hx of  cardiac cath 2006   neg   Hypertension    Mixed urge and stress incontinence    OAB (overactive bladder) 07/13/2012   Osteoarthritis    Osteopenia    rx fosamax 08-2013   Personal history of colonic polyps - adenomas 04/19/2014    Past Surgical History:  Procedure Laterality Date   ABDOMINAL HYSTERECTOMY  1971   APPENDECTOMY  1971   (at time of hyst?)   BLADDER SURGERY  2003   CATARACT EXTRACTION, BILATERAL     CHOLECYSTECTOMY     OOPHORECTOMY  1971   ROTATOR CUFF REPAIR Right    TOE SURGERY  1995   R 2nd --- correction of claw toe     Family History  Problem Relation Age of Onset   Coronary artery disease Father 44       had CABG   Cancer Father        gallbladder   Stroke Mother 23   Ovarian cancer Sister 42   Colon cancer Neg Hx        NEGATIVE   Breast cancer Neg Hx        NEGATIVE    Social History   Socioeconomic History   Marital status: Widowed    Spouse name: Not on file   Number of children: 2   Years of education: Not  on file   Highest education level: Not on file  Occupational History   Occupation: stays at home     Employer: RETIRED  Tobacco Use   Smoking status: Never   Smokeless tobacco: Never  Vaping Use   Vaping status: Never Used  Substance and Sexual Activity   Alcohol use: Not Currently    Comment: wine rarely   Drug use: No   Sexual activity: Not Currently  Other Topics Concern   Not on file  Social History Narrative   Lives at a  independent living community    Lost Husband 06/2019   2 children, Merchant navy officer (airforce), daughter [place] Bells ([place] police), she is very supportive   Social Determinants of Health   Financial Resource Strain: Low Risk  (05/11/2022)   Overall Financial Resource Strain (CARDIA)    Difficulty of Paying Living Expenses: Not hard at all  Food Insecurity: No Food Insecurity (05/11/2022)   Hunger Vital Sign    Worried About Running Out of Food in the Last Year: Never true    Ran Out of Food in the Last Year: Never  true  Transportation Needs: No Transportation Needs (05/11/2022)   PRAPARE - Administrator, Civil Service (Medical): No    Lack of Transportation (Non-Medical): No  Physical Activity: Sufficiently Active (05/11/2022)   Exercise Vital Sign    Days of Exercise per Week: 7 days    Minutes of Exercise per Session: 30 min  Stress: No Stress Concern Present (05/11/2022)   [employer] of Occupational Health - Occupational Stress Questionnaire    Feeling of Stress : Only a little  Social Connections: Moderately Isolated (05/11/2022)   Social Connection and Isolation Panel [NHANES]    Frequency of Communication with Friends and Family: More than three times a week    Frequency of Social Gatherings with Friends and Family: More than three times a week    Attends Religious Services: More than 4 times per year    Active Member of [employer] or Organizations: No    Attends Banker Meetings: Never    Marital Status: Widowed  Intimate Partner Violence: Not At Risk (05/11/2022)   Humiliation, Afraid, Rape, and Kick questionnaire    Fear of Current or Ex-Partner: No    Emotionally Abused: No    Physically Abused: No    Sexually Abused: No    Outpatient Medications Prior to Visit  Medication Sig Dispense Refill   APPLE CIDER VINEGAR PO Take 1 tablet by mouth daily.     cholecalciferol (VITAMIN D3) 25 MCG (1000 UNIT) tablet Take 1,000 Units by mouth in the morning and at bedtime.     Cinnamon 500 MG capsule Take 1,000 mg by mouth daily.     memantine (NAMENDA) 5 MG tablet Take 1 tablet (5 mg total) by mouth 2 (two) times daily. 180 tablet 1   Misc Natural Products (BEET ROOT) 500 MG CAPS Take 1,000 mg by mouth daily.     Multiple Vitamins-Minerals (HAIR SKIN & NAILS PO) Take 1 tablet by mouth daily.     omeprazole (PRILOSEC) 20 MG capsule TAKE 1 CAPSULE BY MOUTH TWICE  DAILY BEFORE A MEAL 200 capsule 2   simvastatin (ZOCOR) 20 MG tablet Take 1 tablet (20 mg total) by mouth  daily. 90 tablet 1   alendronate (FOSAMAX) 70 MG tablet Take 1 tablet (70 mg total) by mouth every 7 (seven) days. Take with a full glass of water on an empty stomach. 12  tablet 3   No facility-administered medications prior to visit.    No Known Allergies  ROS See HPI    Objective:    Physical Exam Constitutional:      General: She is not in acute distress.    Appearance: Normal appearance. She is well-developed.  HENT:     Head: Normocephalic and atraumatic.     Right Ear: External ear normal.     Left Ear: External ear normal.  Eyes:     General: No scleral icterus. Neck:     Thyroid: No thyromegaly.  Cardiovascular:     Rate and Rhythm: Normal rate and regular rhythm.     Heart sounds: Normal heart sounds. No murmur heard. Pulmonary:     Effort: Pulmonary effort is normal. No respiratory distress.     Breath sounds: Normal breath sounds. No wheezing.  Musculoskeletal:     Cervical back: Neck supple.  Skin:    General: Skin is warm and dry.     Comments: Raised skin lesion right forearm  Neurological:     Mental Status: She is alert.     Comments: Mildly confused at times but pleasant  Psychiatric:        Mood and Affect: Mood normal.        Behavior: Behavior normal.        Thought Content: Thought content normal.        Judgment: Judgment normal.      BP (!) 152/84   Pulse 84   Temp 98.2 F (36.8 C) (Oral)   Resp 16   SpO2 100%  Wt Readings from Last 3 Encounters:  04/06/23 189 lb 4 oz (85.8 kg)  02/05/23 186 lb 12.8 oz (84.7 kg)  01/05/23 185 lb 2 oz (84 kg)       Assessment & Plan:   Problem List Items Addressed This Visit       Unprioritized   Vitamin D deficiency    Check follow up level.       Pain in both knees    Chronic, uncontrolled. Continue tylenol, refer to sports medicine.       Relevant Orders   Ambulatory referral to Sports Medicine   Hyperlipidemia    Update lipids, continue simvastatin.       Hyperglycemia,  unspecified    Update A1C.       Essential hypertension    BP mildly elevated. Not currently on antihypertensive.  Due to recent fall will not initiate bp medication.  Monitor for now.       Dementia (HCC)    Worsening memory loss per daughter.  Will add Aricept, continue namenda.  Check urine culture to rule out UTI due to worsening confusion.       Relevant Medications   donepezil (ARICEPT) 5 MG tablet   Other Visit Diagnoses     Skin lesion    -  Primary   Relevant Orders   Ambulatory referral to Dermatology   History of UTI       Relevant Orders   Urine Culture   Needs flu shot       Relevant Orders   Flu Vaccine Trivalent High Dose (Fluad) (Completed)       I have discontinued Chassity A. Ram's alendronate. I am also having her start on donepezil. Additionally, I am having her maintain her Cinnamon, cholecalciferol, Multiple Vitamins-Minerals (HAIR SKIN & NAILS PO), APPLE CIDER VINEGAR PO, Beet Root, omeprazole, simvastatin, and memantine.  Meds ordered this encounter  Medications   donepezil (ARICEPT) 5 MG tablet    Sig: Take 1 tablet (5 mg total) by mouth at bedtime.    Dispense:  30 tablet    Refill:  0    Order Specific Question:   Supervising Provider    Answer:   Danise Edge A [[id number]]

## 2023-06-15 NOTE — Assessment & Plan Note (Signed)
Chronic, uncontrolled. Continue tylenol, refer to sports medicine.

## 2023-06-15 NOTE — Assessment & Plan Note (Signed)
Worsening memory loss per daughter.  Will add Aricept, continue namenda.  Check urine culture to rule out UTI due to worsening confusion.

## 2023-06-15 NOTE — Assessment & Plan Note (Signed)
Update A1C

## 2023-06-15 NOTE — Assessment & Plan Note (Signed)
Update lipids, continue simvastatin.

## 2023-06-15 NOTE — Assessment & Plan Note (Signed)
Check follow up level.

## 2023-06-16 ENCOUNTER — Ambulatory Visit: Payer: Medicare Other | Admitting: Sports Medicine

## 2023-06-16 ENCOUNTER — Encounter: Payer: Self-pay | Admitting: Sports Medicine

## 2023-06-16 ENCOUNTER — Other Ambulatory Visit: Payer: Medicare Other

## 2023-06-16 VITALS — Ht 67.0 in | Wt 189.0 lb

## 2023-06-16 DIAGNOSIS — R413 Other amnesia: Secondary | ICD-10-CM | POA: Diagnosis not present

## 2023-06-16 DIAGNOSIS — M170 Bilateral primary osteoarthritis of knee: Secondary | ICD-10-CM | POA: Diagnosis not present

## 2023-06-16 DIAGNOSIS — Z87440 Personal history of urinary (tract) infections: Secondary | ICD-10-CM

## 2023-06-16 MED ORDER — METHYLPREDNISOLONE ACETATE 40 MG/ML IJ SUSP
40.0000 mg | Freq: Once | INTRAMUSCULAR | Status: AC
Start: 2023-06-16 — End: 2023-06-16
  Administered 2023-06-16: 40 mg via INTRA_ARTICULAR

## 2023-06-16 NOTE — Progress Notes (Signed)
   Subjective:    Patient ID: [name], female    DOB: 1936-06-22, 87 y.o.   MRN: [id number]  HPI chief complaint: Bilateral knee pain  Patient is a very pleasant 87 year old female that presents today with her daughter to discuss bilateral knee pain.  This is a chronic issue for her.  She is been told in the past that she has arthritis.  She lives in an assisted living facility.  She had physical therapy earlier this year but it was not effective.  X-rays done in 2021 showed some mild degenerative changes but they were not standing films.  She does not take any pain medicine.  She and her daughter would be interested in trying cortisone injections today.  Past medical history reviewed Medications reviewed Allergies reviewed   Review of Systems As above    Objective:   Physical Exam  Left knee: Extension lag of 5 degrees.  Flexion to 90 degrees.  1+ boggy synovitis.  No erythema.  Knee is stable to ligamentous exam.  Right knee: Range of motion 0 to 120 degrees.  1+ boggy synovitis.  No erythema.  Knee stable ligamentous exam.      Assessment & Plan:   Bilateral knee pain secondary to DJD  Each of her knees were injected today with cortisone.  Anterior lateral approach utilized for both knees.  She tolerates this without difficulty.  She does have access to a stationary bike and I encouraged her to use it.  I also explained to her and her daughter that if these injections work for 3 months or longer they would we can certainly repeat them.  She will follow-up as needed.  Consent obtained and verified. Time-out conducted. Noted no overlying erythema, induration, or other signs of local infection. Skin prepped in a sterile fashion. Topical analgesic spray: Ethyl chloride. Joint: Left knee Needle: 25-gauge 1.5 inch Completed without difficulty. Meds: 2 cc 1% Xylocaine, 1 cc (40 mg) Depo-Medrol  Consent obtained and verified. Time-out conducted. Noted no overlying erythema,  induration, or other signs of local infection. Skin prepped in a sterile fashion. Topical analgesic spray: Ethyl chloride. Joint: Right knee Needle: 5 gauge 1.5 inch Completed without difficulty. Meds: 2 cc 1% Xylocaine, 1 cc (40 mg) Depo-Medrol  This note was dictated using Dragon naturally speaking software and may contain errors in syntax, spelling, or content which have not been identified prior to signing this note.

## 2023-06-16 NOTE — Progress Notes (Signed)
Pt dropped off specimen.

## 2023-06-16 NOTE — Addendum Note (Signed)
Addended by: Merrilyn Puma on: 06/16/2023 02:23 PM   Modules accepted: Orders

## 2023-06-18 ENCOUNTER — Other Ambulatory Visit: Payer: Self-pay | Admitting: Family

## 2023-06-18 NOTE — Telephone Encounter (Signed)
Please contact daughter and let her know that her urine culture shows UTI. I would like for her to start Keflex, not sure which pharmacy she prefers though.

## 2023-06-19 LAB — URINE CULTURE
MICRO NUMBER:: 15542375
SPECIMEN QUALITY:: ADEQUATE

## 2023-06-20 ENCOUNTER — Emergency Department (HOSPITAL_COMMUNITY): Payer: Medicare Other

## 2023-06-20 ENCOUNTER — Other Ambulatory Visit: Payer: Self-pay

## 2023-06-20 ENCOUNTER — Encounter (HOSPITAL_COMMUNITY): Payer: Self-pay

## 2023-06-20 ENCOUNTER — Emergency Department (HOSPITAL_COMMUNITY)
Admission: EM | Admit: 2023-06-20 | Discharge: 2023-06-20 | Disposition: A | Discharge status: Home / Self Care | Payer: Medicare Other | Attending: Emergency Medicine | Admitting: Emergency Medicine

## 2023-06-20 DIAGNOSIS — M533 Sacrococcygeal disorders, not elsewhere classified: Secondary | ICD-10-CM | POA: Diagnosis not present

## 2023-06-20 DIAGNOSIS — F0390 Unspecified dementia without behavioral disturbance: Secondary | ICD-10-CM | POA: Insufficient documentation

## 2023-06-20 DIAGNOSIS — R42 Dizziness and giddiness: Secondary | ICD-10-CM | POA: Diagnosis not present

## 2023-06-20 DIAGNOSIS — R531 Weakness: Secondary | ICD-10-CM | POA: Diagnosis not present

## 2023-06-20 DIAGNOSIS — N3001 Acute cystitis with hematuria: Secondary | ICD-10-CM | POA: Diagnosis not present

## 2023-06-20 DIAGNOSIS — M47816 Spondylosis without myelopathy or radiculopathy, lumbar region: Secondary | ICD-10-CM | POA: Diagnosis not present

## 2023-06-20 DIAGNOSIS — R300 Dysuria: Secondary | ICD-10-CM | POA: Diagnosis present

## 2023-06-20 LAB — URINALYSIS, ROUTINE W REFLEX MICROSCOPIC
Bilirubin Urine: NEGATIVE
Glucose, UA: NEGATIVE mg/dL
Ketones, ur: NEGATIVE mg/dL
Nitrite: NEGATIVE
Protein, ur: NEGATIVE mg/dL
Specific Gravity, Urine: 1.009 (ref 1.005–1.030)
WBC, UA: >50 WBC/hpf (ref 0–5)
pH: 6 (ref 5.0–8.0)

## 2023-06-20 LAB — COMPREHENSIVE METABOLIC PANEL
ALT: 21 U/L (ref 0–44)
AST: 20 U/L (ref 15–41)
Albumin: 4.5 g/dL (ref 3.5–5.0)
Alkaline Phosphatase: 50 U/L (ref 38–126)
Anion gap: 12 (ref 5–15)
BUN: 29 mg/dL — ABNORMAL HIGH (ref 8–23)
CO2: 21 mmol/L — ABNORMAL LOW (ref 22–32)
Calcium: 9.5 mg/dL (ref 8.9–10.3)
Chloride: 107 mmol/L (ref 98–111)
Creatinine, Ser: 0.8 mg/dL (ref 0.44–1.00)
GFR, Estimated: >60 mL/min (ref >60)
Glucose, Bld: 95 mg/dL (ref 70–99)
Potassium: 4 mmol/L (ref 3.5–5.1)
Sodium: 140 mmol/L (ref 135–145)
Total Bilirubin: 0.7 mg/dL (ref 0.3–1.2)
Total Protein: 7.4 g/dL (ref 6.5–8.1)

## 2023-06-20 LAB — CBC WITH DIFFERENTIAL/PLATELET
Abs Immature Granulocytes: 0.06 10*3/uL (ref 0.00–0.07)
Basophils Absolute: 0.1 10*3/uL (ref 0.0–0.1)
Basophils Relative: 1 %
Eosinophils Absolute: 0.1 10*3/uL (ref 0.0–0.5)
Eosinophils Relative: 2 %
HCT: 39.6 % (ref 36.0–46.0)
Hemoglobin: 12.5 g/dL (ref 12.0–15.0)
Immature Granulocytes: 1 %
Lymphocytes Relative: 25 %
Lymphs Abs: 1.6 10*3/uL (ref 0.7–4.0)
MCH: 30.9 pg (ref 26.0–34.0)
MCHC: 31.6 g/dL (ref 30.0–36.0)
MCV: 97.8 fL (ref 80.0–100.0)
Monocytes Absolute: 0.7 10*3/uL (ref 0.1–1.0)
Monocytes Relative: 10 %
Neutro Abs: 4 10*3/uL (ref 1.7–7.7)
Neutrophils Relative %: 61 %
Platelets: 201 10*3/uL (ref 150–400)
RBC: 4.05 MIL/uL (ref 3.87–5.11)
RDW: 12.7 % (ref 11.5–15.5)
WBC: 6.5 10*3/uL (ref 4.0–10.5)
nRBC: 0 % (ref 0.0–0.2)

## 2023-06-20 MED ORDER — SODIUM CHLORIDE 0.9 % IV SOLN
2.0000 g | Freq: Once | INTRAVENOUS | Status: AC
  Administered 2023-06-20: 2 g via INTRAVENOUS
  Filled 2023-06-20: qty 20

## 2023-06-20 MED ORDER — CEPHALEXIN 500 MG PO CAPS: 500.0000 mg | ORAL_CAPSULE | Freq: Four times a day (QID) | ORAL | 0 refills | Status: DC

## 2023-06-20 MED ORDER — TRAMADOL HCL 50 MG PO TABS
50.0000 mg | ORAL_TABLET | Freq: Once | ORAL | Status: AC
  Administered 2023-06-20: 50 mg via ORAL
  Filled 2023-06-20: qty 1

## 2023-06-20 MED ORDER — TRAMADOL HCL 50 MG PO TABS
ORAL_TABLET | ORAL | 0 refills | Status: DC
Start: 2023-06-20 — End: 2023-07-23

## 2023-06-20 MED ORDER — SODIUM CHLORIDE 0.9 % IV BOLUS
1000.0000 mL | Freq: Once | INTRAVENOUS | Status: AC
  Administered 2023-06-20: 1000 mL via INTRAVENOUS

## 2023-06-20 NOTE — ED Triage Notes (Signed)
Patient reports increased urination, back pain, weakness, and dizziness since last night. Recently had a UA done for UTI, but does not know the results. Patient has hx of dementia and is at her baseline.

## 2023-06-20 NOTE — Discharge Instructions (Signed)
Follow-up with your doctor in 1 week to recheck your urinary tract infection and your back pain

## 2023-06-20 NOTE — Telephone Encounter (Signed)
Unfortunately, it looks like pt went to the ED over the weekend.  Keflex was prescribed by ED Physician.

## 2023-06-22 NOTE — ED Provider Notes (Signed)
[place] [facility] AT [facility] Provider Note   CSN: [id number] Arrival date & time: 06/20/23  0813     History  Chief Complaint  Patient presents with   Weakness   Dizziness    [name] is a 87 y.o. female.  Patient complains of dysuria and also pain in her sacrum that has been going on for a while.  Patient has a history of dementia  The history is provided by the patient and medical records. No language interpreter was used.  Weakness Severity:  Mild Onset quality:  Sudden Timing:  Constant Progression:  Waxing and waning Chronicity:  New Context: not alcohol use   Relieved by:  Nothing Worsened by:  Nothing Ineffective treatments:  None tried Associated symptoms: dizziness   Associated symptoms: no abdominal pain, no chest pain, no cough, no diarrhea, no frequency, no headaches and no seizures   Dizziness Associated symptoms: weakness   Associated symptoms: no chest pain, no diarrhea and no headaches        Home Medications Prior to Admission medications   Medication Sig Start Date End Date Taking? Authorizing Provider  cephALEXin (KEFLEX) 500 MG capsule Take 1 capsule (500 mg total) by mouth 4 (four) times daily. 06/20/23  Yes Bethann Berkshire, MD  traMADol Janean Sark) 50 MG tablet Take 1 every 6 hours as needed for pain has not relieved by Tylenol or Motrin alone 06/20/23  Yes Bethann Berkshire, MD  APPLE CIDER VINEGAR PO Take 1 tablet by mouth daily.    [provider]  cholecalciferol (VITAMIN D3) 25 MCG (1000 UNIT) tablet Take 1,000 Units by mouth in the morning and at bedtime.    [provider]  Cinnamon 500 MG capsule Take 1,000 mg by mouth daily.    [provider]  donepezil (ARICEPT) 5 MG tablet Take 1 tablet (5 mg total) by mouth at bedtime. 06/15/23   Sandford Craze, NP  memantine (NAMENDA) 5 MG tablet Take 1 tablet (5 mg total) by mouth 2 (two) times daily. 06/09/23   Wanda Plump, MD  Misc Natural  Products (BEET ROOT) 500 MG CAPS Take 1,000 mg by mouth daily.    [provider]  Multiple Vitamins-Minerals (HAIR SKIN & NAILS PO) Take 1 tablet by mouth daily.    [provider]  omeprazole (PRILOSEC) 20 MG capsule TAKE 1 CAPSULE BY MOUTH TWICE  DAILY BEFORE A MEAL 02/15/23   Wanda Plump, MD  simvastatin (ZOCOR) 20 MG tablet Take 1 tablet (20 mg total) by mouth daily. 05/31/23   Wanda Plump, MD      Allergies    Patient has no known allergies.    Review of Systems   Review of Systems  Constitutional:  Negative for appetite change and fatigue.  HENT:  Negative for congestion, ear discharge and sinus pressure.   Eyes:  Negative for discharge.  Respiratory:  Negative for cough.   Cardiovascular:  Negative for chest pain.  Gastrointestinal:  Negative for abdominal pain and diarrhea.  Genitourinary:  Negative for frequency and hematuria.  Musculoskeletal:  Positive for back pain.  Skin:  Negative for rash.  Neurological:  Positive for dizziness and weakness. Negative for seizures and headaches.  Psychiatric/Behavioral:  Negative for hallucinations.     Physical Exam Updated Vital Signs BP (!) 154/68 (BP Location: Left Arm)   Pulse 78   Temp 98.2 F (36.8 C) (Oral)   Resp 18   Ht 5\' 7"  (1.702 m)  Wt 85.7 kg   SpO2 97%   BMI 29.60 kg/m  Physical Exam Vitals and nursing note reviewed.  Constitutional:      Appearance: She is well-developed.  HENT:     Head: Normocephalic.     Right Ear: External ear normal.     Left Ear: External ear normal.     Nose: Nose normal.  Eyes:     General: No scleral icterus.    Conjunctiva/sclera: Conjunctivae normal.  Neck:     Thyroid: No thyromegaly.  Cardiovascular:     Rate and Rhythm: Normal rate and regular rhythm.     Heart sounds: No murmur heard.    No friction rub. No gallop.  Pulmonary:     Breath sounds: No stridor. No wheezing or rales.  Chest:     Chest wall: No tenderness.  Abdominal:     General:  There is no distension.     Tenderness: There is no abdominal tenderness. There is no rebound.  Genitourinary:    Comments: Tender sacrum and coccyx mild Musculoskeletal:        General: Normal range of motion.     Cervical back: Neck supple.  Lymphadenopathy:     Cervical: No cervical adenopathy.  Skin:    Findings: No erythema or rash.  Neurological:     Mental Status: She is alert and oriented to person, place, and time.     Motor: No abnormal muscle tone.     Coordination: Coordination normal.  Psychiatric:        Behavior: Behavior normal.     ED Results / Procedures / Treatments   Labs (all labs ordered are listed, but only abnormal results are displayed) Labs Reviewed  URINE CULTURE - Abnormal; Notable for the following components:      Result Value   Culture   (*)    Value: >=100,000 COLONIES/mL PROTEUS MIRABILIS SUSCEPTIBILITIES TO FOLLOW Performed at [facility], [address] [place], [place] [address]    All other components within normal limits  COMPREHENSIVE METABOLIC PANEL - Abnormal; Notable for the following components:   CO2 21 (*)    BUN 29 (*)    All other components within normal limits  URINALYSIS, ROUTINE W REFLEX MICROSCOPIC - Abnormal; Notable for the following components:   Hgb urine dipstick SMALL (*)    Leukocytes,Ua LARGE (*)    Bacteria, UA RARE (*)    All other components within normal limits  CBC WITH DIFFERENTIAL/PLATELET    EKG None  Radiology DG Sacrum/Coccyx  Result Date: 06/20/2023 CLINICAL DATA:  Pain EXAM: SACRUM AND COCCYX - 3 VIEW COMPARISON:  None Available. FINDINGS: Osteopenia. No fracture or dislocation. Sclerosis and small osteophytes along the sacroiliac joints. Degenerative changes also along the lower lumbar spine at the edge of the imaging field. Hypertrophic changes along the pubic symphysis. IMPRESSION: Osteopenia.  Degenerative changes. Electronically Signed   By: Karen Kays M.D.   On: 06/20/2023 16:14     Procedures Procedures    Medications Ordered in ED Medications  sodium chloride 0.9 % bolus 1,000 mL (0 mLs Intravenous Stopped 06/20/23 1543)  cefTRIAXone (ROCEPHIN) 2 g in sodium chloride 0.9 % 100 mL IVPB (0 g Intravenous Stopped 06/20/23 1353)  traMADol (ULTRAM) tablet 50 mg (50 mg Oral Given 06/20/23 1456)    ED Course/ Medical Decision Making/ A&P  Medical Decision Making Amount and/or Complexity of Data Reviewed Labs: ordered. Radiology: ordered.  Risk Prescription drug management.  This patient presents to the ED for concern of dysuria and back pain, this involves an extensive number of treatment options, and is a complaint that carries with it a high risk of complications and morbidity.  The differential diagnosis includes UTI, kidney stone   Co morbidities that complicate the patient evaluation  Dementia   Additional history obtained:  Additional history obtained from family External records from outside source obtained and reviewed including hospital records   Lab Tests:  I Ordered, and personally interpreted labs.  The pertinent results include: Urinalysis shows greater than 50 white cells   Imaging Studies ordered:  I ordered imaging studies including sacrum and coccyx I independently visualized and interpreted imaging which showed degenerative changes I agree with the radiologist interpretation   Cardiac Monitoring: / EKG:  The patient was maintained on a cardiac monitor.  I personally viewed and interpreted the cardiac monitored which showed an underlying rhythm of: Normal sinus rhythm   Consultations Obtained:  No consultant Problem List / ED Course / Critical interventions / Medication management  Dementia, UTI, degenerative changes in lower back I ordered medication including Rocephin for UTI Reevaluation of the patient after these medicines showed that the patient stayed the same I have reviewed the  patients home medicines and have made adjustments as needed   Social Determinants of Health:  None   Test / Admission - Considered:  None  Patient with urinary tract infection.  She is sent home on Keflex.  Also patient with arthritis in her lower spine pain.  Patient is given tramadol if Tylenol or Motrin does not help.  She is referred to her PCP        Final Clinical Impression(s) / ED Diagnoses Final diagnoses:  Acute cystitis with hematuria    Rx / DC Orders ED Discharge Orders          Ordered    cephALEXin (KEFLEX) 500 MG capsule  4 times daily        06/20/23 1542    traMADol (ULTRAM) 50 MG tablet        06/20/23 1542              Bethann Berkshire, MD 06/22/23 1031

## 2023-06-23 LAB — URINE CULTURE: Culture: >=100000 — AB

## 2023-06-24 ENCOUNTER — Telehealth (HOSPITAL_BASED_OUTPATIENT_CLINIC_OR_DEPARTMENT_OTHER): Payer: Self-pay | Admitting: *Deleted

## 2023-06-24 NOTE — Telephone Encounter (Signed)
Post ED Visit - Positive Culture Follow-up  Culture report reviewed by antimicrobial stewardship pharmacist: [facility] []  Nathan Batchelder, Pharm.D. []  Celedonio Miyamoto, Pharm.D., BCPS AQ-ID []  Garvin Fila, Pharm.D., BCPS []  Georgina Pillion, [address].D., BCPS []  [place], [address].D., BCPS, AAHIVP []  Estella Husk, Pharm.D., BCPS, AAHIVP []  Lysle Pearl, PharmD, BCPS []  Phillips Climes, PharmD, BCPS []  Agapito Games, PharmD, BCPS []  Verlan Friends, PharmD []  Mervyn Gay, PharmD, BCPS []  Vinnie Level, PharmD  [facility] [x]  Roslyn Smiling, PharmD []  Greer Pickerel, PharmD []  Adalberto Tabone, PharmD []  Perlie Gold, Rph []  Lonell Face) Jean Rosenthal, PharmD []  Earl Many, PharmD []  Junita Push, PharmD []  Dorna Leitz, PharmD []  Terrilee Files, PharmD []  Lynann Beaver, PharmD []  Keturah Barre, PharmD []  Loralee Pacas, PharmD []  Bernadene Person, PharmD   Positive Urine culture Treated with cephalexin, organism sensitive to the same and no further patient follow-up is required at this time.  Bing Quarry 06/24/2023, 11:18 AM

## 2023-07-01 ENCOUNTER — Other Ambulatory Visit: Payer: Medicare Other

## 2023-07-05 ENCOUNTER — Other Ambulatory Visit: Payer: Medicare Other

## 2023-07-12 ENCOUNTER — Encounter: Payer: Medicare Other | Admitting: Internal Medicine

## 2023-07-20 ENCOUNTER — Telehealth: Payer: Self-pay

## 2023-07-20 ENCOUNTER — Ambulatory Visit: Payer: Medicare Other | Admitting: Internal Medicine

## 2023-07-20 NOTE — Telephone Encounter (Signed)
Transition Care Management Follow-up Telephone Call Date of discharge and from where: 06/20/2023 [facility] How have you been since you were released from the hospital? Per patient's daughter she is feeling better. Any questions or concerns? No  Items Reviewed: Did the pt receive and understand the discharge instructions provided? Yes  Medications obtained and verified? Yes  Other? No  Any new allergies since your discharge? No  Dietary orders reviewed? Yes Do you have support at home? Yes   Follow up appointments reviewed:  PCP Hospital f/u appt confirmed? Yes  Scheduled to see Wanda Plump, MD on 07/20/2023 @ [facility] f/u appt confirmed? No  Scheduled to see  on  @ . Are transportation arrangements needed? No  If their condition worsens, is the pt aware to call PCP or go to the Emergency Dept.? Yes Was the patient provided with contact information for the PCP's office or ED? Yes Was to pt encouraged to call back with questions or concerns? Yes   Illyana Schorsch [facility]  [facility], [facility] Guide Direct Dial: [phone]  Website: [email or web address]

## 2023-07-23 ENCOUNTER — Encounter: Payer: Self-pay | Admitting: Internal Medicine

## 2023-07-23 ENCOUNTER — Other Ambulatory Visit (HOSPITAL_BASED_OUTPATIENT_CLINIC_OR_DEPARTMENT_OTHER): Payer: Self-pay

## 2023-07-23 ENCOUNTER — Ambulatory Visit (INDEPENDENT_AMBULATORY_CARE_PROVIDER_SITE_OTHER): Payer: Medicare Other | Admitting: Internal Medicine

## 2023-07-23 VITALS — BP 138/82 | HR 86 | Temp 98.2°F | Resp 18 | Ht 67.0 in | Wt 190.4 lb

## 2023-07-23 DIAGNOSIS — F03B0 Unspecified dementia, moderate, without behavioral disturbance, psychotic disturbance, mood disturbance, and anxiety: Secondary | ICD-10-CM | POA: Diagnosis not present

## 2023-07-23 DIAGNOSIS — M545 Low back pain, unspecified: Secondary | ICD-10-CM

## 2023-07-23 DIAGNOSIS — F32A Depression, unspecified: Secondary | ICD-10-CM

## 2023-07-23 DIAGNOSIS — M810 Age-related osteoporosis without current pathological fracture: Secondary | ICD-10-CM

## 2023-07-23 MED ORDER — VENLAFAXINE HCL ER 75 MG PO CP24
75.0000 mg | ORAL_CAPSULE | Freq: Every day | ORAL | 0 refills | Status: DC
  Filled 2023-07-23: qty 30, 30d supply, fill #0

## 2023-07-23 MED ORDER — VENLAFAXINE HCL ER 75 MG PO CP24: 75.0000 mg | ORAL_CAPSULE | Freq: Every day | ORAL | 1 refills | Status: DC

## 2023-07-23 NOTE — Assessment & Plan Note (Signed)
Compliance.  Discussed with the patient's daughter over the phone. ER visit 06/20/2023, had dysuria and sacrum pain. Labs: Creatinine 0.8, LFTs normal, hemoglobin 12.5.  Urine culture on Proteus mirabilis. Sacrum x-ray osteopenia.  DJD. HTN: BP looks good, on no meds. High cholesterol: On simvastatin, no change. Depression:   daughter reports she has been more tearful,, more depressed.  Medication?  We agreed to restart Effexor XR 75 mg.  Rx sent. Dementia: On Aricept and Namenda, symptoms increasing, probably related to increased depression, recent use of Ultram and also b/c  her daughter had a leg fracture and has not been able to visit her mother as she did before. Osteoporosis: Patient refused to take Fosamax, she thinks it affects her memory thus unable to treat osteoporosis.  See last entry. UTI: Went to the ER, s/p antibiotics. Lumbar pain: Went to the ER, sacrum x-ray negative.  She is slightly TTP at the lumbar spine, although she has dementia and her answers may not be reliable, she denies radiculopathy type of symptoms .  Currently on Tylenol 1 tablet twice daily.  Plan: Increase Tylenol to 2 tablets TID.  Stop Ultram. RTC 3 months, all instructions shared with and via message

## 2023-07-23 NOTE — Patient Instructions (Addendum)
Please make an appointment to see me in 3 months

## 2023-07-23 NOTE — Progress Notes (Signed)
Subjective:    Patient ID: [name], female    DOB: 17-May-1936, 87 y.o.   MRN: [id number]  DOS:  07/23/2023 Type of visit - description: Follow-up, here by herself  Micah Flesher to the ER last month, notes reviewed. Patient does not recall going to the emergency room  She is not sure about what medication she takes.  Did report pain at the back.  I had extended conversation with the patient's daughter over the phone. We talk about medications she takes. She also confirms that she has back pain and knee pain.   Review of Systems See above   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   Depression    GERD (gastroesophageal reflux disease)    History of gastroesophageal reflux (GERD)    Hx of adenomatous colonic polyps    Hx of cardiac cath 2006   neg   Hypertension    Mixed urge and stress incontinence    OAB (overactive bladder) 07/13/2012   Osteoarthritis    Osteopenia    rx fosamax 08-2013   Personal history of colonic polyps - adenomas 04/19/2014    Past Surgical History:  Procedure Laterality Date   ABDOMINAL HYSTERECTOMY  1971   APPENDECTOMY  1971   (at time of hyst?)   BLADDER SURGERY  2003   CATARACT EXTRACTION, BILATERAL     CHOLECYSTECTOMY     OOPHORECTOMY  1971   ROTATOR CUFF REPAIR Right    TOE SURGERY  1995   R 2nd --- correction of claw toe     Current Outpatient Medications  Medication Instructions   APPLE CIDER VINEGAR PO 1 tablet, Oral, Daily   Beet Root 1,000 mg, Daily   cephALEXin (KEFLEX) 500 mg, Oral, 4 times daily   cholecalciferol (VITAMIN D3) 1,000 Units, Oral, 2 times daily   Cinnamon 1,000 mg, Daily   donepezil (ARICEPT) 5 mg, Oral, Daily at bedtime   memantine (NAMENDA) 5 mg, Oral, 2 times daily   Multiple  Vitamins-Minerals (HAIR SKIN & NAILS PO) 1 tablet, Oral, Daily   omeprazole (PRILOSEC) 20 MG capsule TAKE 1 CAPSULE BY MOUTH TWICE  DAILY BEFORE A MEAL   simvastatin (ZOCOR) 20 mg, Oral, Daily   traMADol (ULTRAM) 50 MG tablet Take 1 every 6 hours as needed for pain has not relieved by Tylenol or Motrin alone       Objective:   Physical Exam BP 138/82   Pulse 86   Temp 98.2 F (36.8 C) (Oral)   Resp 18   Ht 5\' 7"  (1.702 m)   Wt 190 lb 6 oz (86.4 kg)   SpO2 97%   BMI 29.82 kg/m  General:   Well developed, NAD, BMI noted. HEENT:  Normocephalic . Face symmetric, atraumatic Lungs:  CTA B Normal respiratory effort, no intercostal retractions, no accessory muscle use. Heart: + Systolic murmur.  Lower extremities: no pretibial edema bilaterally  Skin: Not pale. Not jaundice MSK: Slightly TTP mostly at the distal lumbar spine.  Nontender at the sacrum. Neurologic:  alert & pleasantly demented.  Cooperative.  Speech normal, gait assisted by walker Psych--  Behavior appropriate. No anxious or depressed appearing.      Assessment     Assessment HTN Hyperlipidemia  GERD Depression Dementia: -sxs started shortly after she lost her husband.  MMSE 25 - DX 02/2020: MRI brain no acute, labs negative, Rx Namenda Osteoporosis:  T score plus ankle fracture 03-2019: t score 2008 -1.8, 2011 -1.8, 2014 -2.1: Fosamax Rx 08-2013 t score 03-2016: -2.1.  10/2020 Tscore  -2.7: Switch from Fosamax to Prolia 03-2023 H/o vit d def  DJD- Aortic stenosis ( at some point diagnosed with HOCM) ---echo 01-2010: "Mild AS but no evidence of HCM or MR.  ---Echo 6-15 , 05-2019 :stable mild AoS. Overactive bladder- pcp started to rx meds 09-2016  Ao [place]: wnl 02-2015 [facility]     PLAN: Compliance.  Discussed with the patient's daughter over the phone. ER visit 06/20/2023, had dysuria and sacrum pain. Labs: Creatinine 0.8, LFTs normal, hemoglobin 12.5.  Urine culture on Proteus mirabilis. Sacrum x-ray  osteopenia.  DJD. HTN: BP looks good, on no meds. High cholesterol: On simvastatin, no change. Depression:   daughter reports she has been more tearful,, more depressed.  Medication?  We agreed to restart Effexor XR 75 mg.  Rx sent. Dementia: On Aricept and Namenda, symptoms increasing, probably related to increased depression, recent use of Ultram and also b/c  her daughter had a leg fracture and has not been able to visit her mother as she did before. Osteoporosis: Patient refused to take Fosamax, she thinks it affects her memory thus unable to treat osteoporosis.  See last entry. UTI: Went to the ER, s/p antibiotics. Lumbar pain: Went to the ER, sacrum x-ray negative.  She is slightly TTP at the lumbar spine, although she has dementia and her answers may not be reliable, she denies radiculopathy type of symptoms .  Currently on Tylenol 1 tablet twice daily.  Plan: Increase Tylenol to 2 tablets TID.  Stop Ultram. RTC 3 months, all instructions shared with and via message

## 2023-07-24 ENCOUNTER — Emergency Department (HOSPITAL_COMMUNITY)
Admission: EM | Admit: 2023-07-24 | Discharge: 2023-07-24 | Disposition: A | Discharge status: Home / Self Care | Payer: Medicare Other | Attending: Emergency Medicine | Admitting: Emergency Medicine

## 2023-07-24 ENCOUNTER — Emergency Department (HOSPITAL_COMMUNITY): Payer: Medicare Other

## 2023-07-24 DIAGNOSIS — N3000 Acute cystitis without hematuria: Secondary | ICD-10-CM | POA: Insufficient documentation

## 2023-07-24 DIAGNOSIS — R42 Dizziness and giddiness: Secondary | ICD-10-CM | POA: Diagnosis not present

## 2023-07-24 DIAGNOSIS — H579 Unspecified disorder of eye and adnexa: Secondary | ICD-10-CM | POA: Diagnosis not present

## 2023-07-24 DIAGNOSIS — R404 Transient alteration of awareness: Secondary | ICD-10-CM | POA: Diagnosis not present

## 2023-07-24 DIAGNOSIS — I499 Cardiac arrhythmia, unspecified: Secondary | ICD-10-CM | POA: Diagnosis not present

## 2023-07-24 DIAGNOSIS — H538 Other visual disturbances: Secondary | ICD-10-CM | POA: Diagnosis not present

## 2023-07-24 DIAGNOSIS — G4489 Other headache syndrome: Secondary | ICD-10-CM | POA: Diagnosis not present

## 2023-07-24 DIAGNOSIS — R55 Syncope and collapse: Secondary | ICD-10-CM | POA: Diagnosis not present

## 2023-07-24 DIAGNOSIS — R519 Headache, unspecified: Secondary | ICD-10-CM | POA: Diagnosis not present

## 2023-07-24 DIAGNOSIS — R9082 White matter disease, unspecified: Secondary | ICD-10-CM | POA: Diagnosis not present

## 2023-07-24 LAB — URINALYSIS, ROUTINE W REFLEX MICROSCOPIC
Bilirubin Urine: NEGATIVE
Glucose, UA: NEGATIVE mg/dL
Hgb urine dipstick: NEGATIVE
Ketones, ur: NEGATIVE mg/dL
Nitrite: POSITIVE — AB
Protein, ur: NEGATIVE mg/dL
Specific Gravity, Urine: 1.012 (ref 1.005–1.030)
pH: 6 (ref 5.0–8.0)

## 2023-07-24 LAB — CBC WITH DIFFERENTIAL/PLATELET
Abs Immature Granulocytes: 0.03 10*3/uL (ref 0.00–0.07)
Basophils Absolute: 0.1 10*3/uL (ref 0.0–0.1)
Basophils Relative: 1 %
Eosinophils Absolute: 0.1 10*3/uL (ref 0.0–0.5)
Eosinophils Relative: 2 %
HCT: 37.5 % (ref 36.0–46.0)
Hemoglobin: 12.4 g/dL (ref 12.0–15.0)
Immature Granulocytes: 1 %
Lymphocytes Relative: 29 %
Lymphs Abs: 1.4 10*3/uL (ref 0.7–4.0)
MCH: 30.9 pg (ref 26.0–34.0)
MCHC: 33.1 g/dL (ref 30.0–36.0)
MCV: 93.5 fL (ref 80.0–100.0)
Monocytes Absolute: 0.5 10*3/uL (ref 0.1–1.0)
Monocytes Relative: 10 %
Neutro Abs: 2.7 10*3/uL (ref 1.7–7.7)
Neutrophils Relative %: 57 %
Platelets: 222 10*3/uL (ref 150–400)
RBC: 4.01 MIL/uL (ref 3.87–5.11)
RDW: 12.6 % (ref 11.5–15.5)
WBC: 4.7 10*3/uL (ref 4.0–10.5)
nRBC: 0 % (ref 0.0–0.2)

## 2023-07-24 LAB — COMPREHENSIVE METABOLIC PANEL
ALT: 21 U/L (ref 0–44)
AST: 21 U/L (ref 15–41)
Albumin: 4.4 g/dL (ref 3.5–5.0)
Alkaline Phosphatase: 45 U/L (ref 38–126)
Anion gap: 7 (ref 5–15)
BUN: 20 mg/dL (ref 8–23)
CO2: 24 mmol/L (ref 22–32)
Calcium: 9.6 mg/dL (ref 8.9–10.3)
Chloride: 108 mmol/L (ref 98–111)
Creatinine, Ser: 1.01 mg/dL — ABNORMAL HIGH (ref 0.44–1.00)
GFR, Estimated: 55 mL/min — ABNORMAL LOW (ref >60)
Glucose, Bld: 105 mg/dL — ABNORMAL HIGH (ref 70–99)
Potassium: 4.1 mmol/L (ref 3.5–5.1)
Sodium: 139 mmol/L (ref 135–145)
Total Bilirubin: 0.8 mg/dL (ref <1.2)
Total Protein: 7.2 g/dL (ref 6.5–8.1)

## 2023-07-24 LAB — TROPONIN I (HIGH SENSITIVITY): Troponin I (High Sensitivity): 14 ng/L

## 2023-07-24 MED ORDER — CEPHALEXIN 500 MG PO CAPS: 500.0000 mg | ORAL_CAPSULE | Freq: Two times a day (BID) | ORAL | 0 refills | Status: AC

## 2023-07-24 NOTE — ED Provider Notes (Signed)
[place] EMERGENCY DEPARTMENT AT [facility] Provider Note   CSN: [id number] Arrival date & time: 07/24/23  0753     History  Chief Complaint  Patient presents with   Near Syncope    [name] is a 87 y.o. female.  Patient here after lightheaded episode when she first woke up this morning.  She was laying up in bed when she got lightheaded and dizzy had a sharp pain to the right side of her head that quickly resolved.  She is feeling fuzzy and lightheaded and blurred vision but did not pass out.  She was sent here for evaluation.  She is asymptomatic at this point.  She is able to ambulate in the room without any issues with my assistance.  She denies any chest pain shortness of breath weakness numbness tingling.  She does not have a headache.  She denies any nausea vomiting diarrhea.  Denies any fever or chills otherwise.  She did start Effexor yesterday but otherwise no new medications.  No weakness numbness tingling speech changes.  The history is provided by the patient.       Home Medications Prior to Admission medications   Medication Sig Start Date End Date Taking? Authorizing Provider  acetaminophen (TYLENOL) 500 MG tablet Take 500 mg by mouth in the morning and at bedtime. 08/28/19  Yes [provider]  cephALEXin (KEFLEX) 500 MG capsule Take 1 capsule (500 mg total) by mouth 2 (two) times daily for 5 days. 07/24/23 07/29/23 Yes Eleanore Junio, DO  cholecalciferol (VITAMIN D3) 25 MCG (1000 UNIT) tablet Take 1,000 Units by mouth in the morning and at bedtime.   Yes [provider]  donepezil (ARICEPT) 5 MG tablet Take 1 tablet (5 mg total) by mouth at bedtime. 06/15/23  Yes Sandford Craze, NP  memantine (NAMENDA) 5 MG tablet Take 1 tablet (5 mg total) by mouth 2 (two) times daily. 06/09/23  Yes Paz, Nolon Rod, MD  Multiple Vitamins-Minerals (HAIR SKIN & NAILS PO) Take 1 tablet by mouth daily.   Yes [provider]  omeprazole  (PRILOSEC) 20 MG capsule TAKE 1 CAPSULE BY MOUTH TWICE  DAILY BEFORE A MEAL Patient taking differently: Take 20 mg by mouth 2 (two) times daily before a meal. 02/15/23  Yes Paz, Nolon Rod, MD  simvastatin (ZOCOR) 20 MG tablet Take 1 tablet (20 mg total) by mouth daily. 05/31/23  Yes Wanda Plump, MD  venlafaxine XR (EFFEXOR XR) 75 MG 24 hr capsule Take 1 capsule (75 mg total) by mouth daily with breakfast. 07/23/23  Yes Wanda Plump, MD  venlafaxine XR (EFFEXOR XR) 75 MG 24 hr capsule Take 1 capsule (75 mg total) by mouth daily with breakfast. Patient not taking: Reported on 07/24/2023 07/23/23   Wanda Plump, MD      Allergies    Patient has no known allergies.    Review of Systems   Review of Systems  Physical Exam Updated Vital Signs BP (!) 157/88   Pulse 72   Temp (!) 97.4 F (36.3 C) (Oral)   Resp 18   Ht 5\' 7"  (1.702 m)   Wt 87.5 kg   SpO2 97%   BMI 30.23 kg/m  Physical Exam Vitals and nursing note reviewed.  Constitutional:      General: She is not in acute distress.    Appearance: She is well-developed. She is not ill-appearing.  HENT:     Head: Normocephalic and atraumatic.  Nose: Nose normal.     Mouth/Throat:     Mouth: Mucous membranes are moist.  Eyes:     Extraocular Movements: Extraocular movements intact.     Conjunctiva/sclera: Conjunctivae normal.     Pupils: Pupils are equal, round, and reactive to light.  Cardiovascular:     Rate and Rhythm: Normal rate and regular rhythm.     Pulses: Normal pulses.     Heart sounds: Normal heart sounds. No murmur heard. Pulmonary:     Effort: Pulmonary effort is normal. No respiratory distress.     Breath sounds: Normal breath sounds.  Abdominal:     Palpations: Abdomen is soft.     Tenderness: There is no abdominal tenderness.  Musculoskeletal:        General: No swelling.     Cervical back: Normal range of motion and neck supple.  Skin:    General: Skin is warm and dry.     Capillary Refill: Capillary refill takes  less than 2 seconds.  Neurological:     General: No focal deficit present.     Mental Status: She is alert.     Comments: Normal visual acuity, normal visual fields, 5+ out of 5 strength throughout, normal sensation, no drift, normal finger-to-nose finger, normal speech, patient able to ambulate with assistance without any symptoms.  Psychiatric:        Mood and Affect: Mood normal.     ED Results / Procedures / Treatments   Labs (all labs ordered are listed, but only abnormal results are displayed) Labs Reviewed  COMPREHENSIVE METABOLIC PANEL - Abnormal; Notable for the following components:      Result Value   Glucose, Bld 105 (*)    Creatinine, Ser 1.01 (*)    GFR, Estimated 55 (*)    All other components within normal limits  URINALYSIS, ROUTINE W REFLEX MICROSCOPIC - Abnormal; Notable for the following components:   APPearance HAZY (*)    Nitrite POSITIVE (*)    Leukocytes,Ua MODERATE (*)    Bacteria, UA FEW (*)    All other components within normal limits  URINE CULTURE  CBC WITH DIFFERENTIAL/PLATELET  TROPONIN I (HIGH SENSITIVITY)    EKG EKG Interpretation Date/Time:  Saturday July 24 2023 08:08:48 EST Ventricular Rate:  80 PR Interval:  51 QRS Duration:  177 QT Interval:  446 QTC Calculation: 515 R Axis:   -24  Text Interpretation: Sinus rhythm Short PR interval Right atrial enlargement Confirmed by Virgina Norfolk [phone]) on 07/24/2023 8:10:43 AM  Radiology CT HEAD WO CONTRAST ( )  Result Date: 07/24/2023 CLINICAL DATA:  87 year old female with dizziness.  Headache. EXAM: CT HEAD WITHOUT CONTRAST TECHNIQUE: Contiguous axial images were obtained from the base of the skull through the vertex without intravenous contrast. RADIATION DOSE REDUCTION: This exam was performed according to the departmental dose-optimization program which includes automated exposure control, adjustment of the mA and/or kV according to patient size and/or use of iterative  reconstruction technique. COMPARISON:  Brain MRI 03/29/2020.  Head CT 08/11/2020. FINDINGS: Brain: Cerebral volume is stable, within normal limits for age. No midline shift, ventriculomegaly, mass effect, evidence of mass lesion, intracranial hemorrhage or evidence of cortically based acute infarction. Chronic Patchy and confluent, widespread bilateral cerebral white matter hypodensity. Asymmetric deep white matter capsule involvement and no significant change compared to 2021. No cortical encephalomalacia identified. Deep gray nuclei and posterior fossa appear relatively spared. Vascular: No suspicious intracranial vascular hyperdensity. Mild Calcified atherosclerosis at the skull base. Skull: No acute  osseous abnormality identified. Chronic hyperostosis of the calvarium. Sinuses/Orbits: Chronic paranasal sinus disease, somewhat improved since 2021. Bubbly opacity now in the right sphenoid sinus. Tympanic cavities and mastoids remain well aerated. Other: No acute orbit or scalp soft tissue finding. IMPRESSION: 1. No acute intracranial abnormality. 2. Chronic advanced cerebral white matter disease not significantly changed since 2021. Electronically Signed   By: Odessa Fleming M.D.   On: 07/24/2023 10:30   DG Chest Portable 1 View  Result Date: 07/24/2023 CLINICAL DATA:  Near syncope. EXAM: PORTABLE CHEST 1 VIEW COMPARISON:  July 16, 2020. FINDINGS: The heart size and mediastinal contours are within normal limits. Both lungs are clear. The visualized skeletal structures are unremarkable. IMPRESSION: No active disease. Electronically Signed   By: Lupita Raider M.D.   On: 07/24/2023 09:16    Procedures Procedures    Medications Ordered in ED Medications - No data to display  ED Course/ Medical Decision Making/ A&P                                 Medical Decision Making Amount and/or Complexity of Data Reviewed Labs: ordered. Radiology: ordered.  Risk Prescription drug management.   Tamaiya A  Haus is here after near syncopal episode.  Normal vitals.  No fever.  EKG shows sinus rhythm.  No ischemic changes.  Unchanged from prior.  She is well-appearing.  Neurologically she is intact.  She had episode of lightheadedness when she first sat up in bed when she woke up this morning.  Now resolved.  She has some blurred vision and headache and lightheadedness but no longer.  She is neurologically intact.  Good strength and sensation able to ambulate with my assistance.  No weakness numbness vision loss or speech changes.  Differential diagnosis likely orthostatic process or transient process.  Her orthostatics here are normal.  I do not think she has had a heart attack or stroke.  Will evaluate for electrolyte abnormalities, cardiac process, infectious process with labs and imaging including CT head chest x-ray and basic labs.  He has been notified.  Per my review and interpretation labs no significant anemia or electrolyte abnormality or kidney injury or leukocytosis.  Urinalysis possibly with infection.  Does not know if she is actually on antibiotics right now for UTI may have started yesterday but will send in prescription for antibiotics just in case family will confirm.  CT scan of the head is unremarkable.  Patient overall doing very well.  Discharged in good condition.  I have no concern for sepsis or stroke or other acute process except for may be UTI and some mild orthostasis today.  Discharged in good condition.  This chart was dictated using voice recognition software.  Despite best efforts to proofread,  errors can occur which can change the documentation meaning.         Final Clinical Impression(s) / ED Diagnoses Final diagnoses:  Acute cystitis without hematuria    Rx / DC Orders ED Discharge Orders          Ordered    cephALEXin (KEFLEX) 500 MG capsule  2 times daily        07/24/23 1150              [place], Madelaine Bhat, DO 07/24/23 1152

## 2023-07-24 NOTE — ED Triage Notes (Addendum)
Pt BIB EMS from [facility] for feeling "dizzy" when moving towards the end of the bed to get up to the bathroom this morning. Pt reports feeling a "shock through right side of the temple and through the head" and headache ever since. Then vision went "fuzzy". Vision remains blurred, but lessened.  Pt reports starting Effexor yesterday for "pain in her back/ tail bone".   VS 180/88 80 HR 94% RA 106 BG

## 2023-07-24 NOTE — ED Notes (Signed)
This RN reviewed discharge instructions with patient and family member. Both verbalized understanding and denied any further questions. PT well appearing upon discharge and denies pain. Pt wheeled to exit. Pt endorses ride home.

## 2023-07-24 NOTE — ED Notes (Signed)
Pt to ct, well appearing.

## 2023-07-26 ENCOUNTER — Telehealth: Payer: Self-pay | Admitting: *Deleted

## 2023-07-26 LAB — URINE CULTURE: Culture: >=100000 — AB

## 2023-07-26 NOTE — Transitions of Care (Post Inpatient/ED Visit) (Signed)
   07/26/2023  Name: [name] MRN: [id number] DOB: [birth year]  Today's TOC FU Call Status: Today's TOC FU Call Status:: Successful TOC FU Call Completed TOC FU Call Complete Date: 07/26/23 Patient's Name and Date of Birth confirmed.  Transition Care Management Follow-up Telephone Call Date of Discharge: 07/24/23 Discharge Facility: [facility]) Type of Discharge: Emergency Department Reason for ED Visit: Other: (UTI) How have you been since you were released from the hospital?: Better Any questions or concerns?: No  Items Reviewed: Did you receive and understand the discharge instructions provided?: No Medications obtained,verified, and reconciled?: Yes (Medications Reviewed) Any new allergies since your discharge?: No Dietary orders reviewed?: NA Do you have support at home?: No (in retirement home, lives in apartment alone)  Medications Reviewed Today: Medications Reviewed Today   Medications were not reviewed in this encounter     Home Care and Equipment/Supplies: Were Home Health Services Ordered?: No Any new equipment or medical supplies ordered?: No  Functional Questionnaire: Do you need assistance with bathing/showering or dressing?: No Do you need assistance with meal preparation?: No Do you need assistance with eating?: No Do you have difficulty maintaining continence: Yes Do you need assistance with getting out of bed/getting out of a chair/moving?: No Do you have difficulty managing or taking your medications?: No  Follow up appointments reviewed: PCP Follow-up appointment confirmed?: Yes Date of PCP follow-up appointment?: 08/03/23 Follow-up Provider: [facility]CP [facility] Follow-up appointment confirmed?: NA Do you need transportation to your follow-up appointment?: No Do you understand care options if your condition(s) worsen?: Yes-patient verbalized understanding    Donne Anon, CMA

## 2023-07-27 NOTE — Telephone Encounter (Signed)
Post ED Visit - Positive Culture Follow-up  Culture report reviewed by antimicrobial stewardship pharmacist: [facility] [x]  Enos Fling, Pharm.D. []  Celedonio Miyamoto, Pharm.D., BCPS AQ-ID []  Garvin Fila, Pharm.D., BCPS []  Georgina Pillion, Pharm.D., BCPS []  [place], [place].D., BCPS, AAHIVP []  Estella Husk, Pharm.D., BCPS, AAHIVP []  Lysle Pearl, PharmD, BCPS []  Phillips Climes, PharmD, BCPS []  Agapito Games, PharmD, BCPS []  Verlan Friends, PharmD []  Mervyn Gay, PharmD, BCPS []  Vinnie Level, PharmD  [facility] []  Len Childs, PharmD []  Greer Pickerel, PharmD []  Adalberto Vetsch, PharmD []  Perlie Gold, Rph []  Lonell Face) Jean Rosenthal, PharmD []  Earl Many, PharmD []  Junita Push, PharmD []  Dorna Leitz, PharmD []  Terrilee Files, PharmD []  Lynann Beaver, PharmD []  Keturah Barre, PharmD []  Loralee Pacas, PharmD []  Bernadene Person, PharmD   Positive urine culture Treated with Cephalexin, organism sensitive to the same and no further patient follow-up is required at this time.  Nena Polio Garner Nash 07/27/2023, 11:01 AM

## 2023-08-03 ENCOUNTER — Encounter: Payer: Self-pay | Admitting: Internal Medicine

## 2023-08-03 ENCOUNTER — Ambulatory Visit (INDEPENDENT_AMBULATORY_CARE_PROVIDER_SITE_OTHER): Payer: Medicare Other | Admitting: Internal Medicine

## 2023-08-03 VITALS — BP 124/68 | HR 92 | Temp 97.8°F | Resp 16 | Ht 67.0 in | Wt 186.0 lb

## 2023-08-03 DIAGNOSIS — R42 Dizziness and giddiness: Secondary | ICD-10-CM

## 2023-08-03 DIAGNOSIS — N390 Urinary tract infection, site not specified: Secondary | ICD-10-CM | POA: Diagnosis not present

## 2023-08-03 NOTE — Progress Notes (Signed)
Subjective:    Patient ID: [name], female    DOB: 1936-03-29, 87 y.o.   MRN: [id number]  DOS:  08/03/2023 Type of visit - description: ER follow-up, here by herself   ER 07/24/2023: Presented with lightheadedness, sharp pain on the right side of the head that quickly resolved. No LOC. Workup included a CT head with no acute changes.  Chest x-ray negative.  UA show leukocytes, bacteria, urine culture E. Coli, pan sensitive.  Other labs satisfactory Rx cephalexin  She is here by herself but reports that overall feeling well. Still has occasional dizziness.  Denies dysuria or gross hematuria No fever or chills No nausea vomiting or diarrhea   Review of Systems See above   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   Depression    GERD (gastroesophageal reflux disease)    History of gastroesophageal reflux (GERD)    Hx of adenomatous colonic polyps    Hx of cardiac cath 2006   neg   Hypertension    Mixed urge and stress incontinence    OAB (overactive bladder) 07/13/2012   Osteoarthritis    Osteopenia    rx fosamax 08-2013   Personal history of colonic polyps - adenomas 04/19/2014    Past Surgical History:  Procedure Laterality Date   ABDOMINAL HYSTERECTOMY  1971   APPENDECTOMY  1971   (at time of hyst?)   BLADDER SURGERY  2003   CATARACT EXTRACTION, BILATERAL     CHOLECYSTECTOMY     OOPHORECTOMY  1971   ROTATOR CUFF REPAIR Right    TOE SURGERY  1995   R 2nd --- correction of claw toe     Current Outpatient Medications  Medication Instructions   acetaminophen (TYLENOL) 500 mg, Oral, 2 times daily   cholecalciferol (VITAMIN D3) 1,000 Units, Oral, 2 times daily   donepezil (ARICEPT) 5 mg, Oral, Daily at bedtime   memantine  (NAMENDA) 5 mg, Oral, 2 times daily   Multiple Vitamins-Minerals (HAIR SKIN & NAILS PO) 1 tablet, Oral, Daily   omeprazole (PRILOSEC) 20 MG capsule TAKE 1 CAPSULE BY MOUTH TWICE  DAILY BEFORE A MEAL   simvastatin (ZOCOR) 20 mg, Oral, Daily   venlafaxine XR (EFFEXOR XR) 75 mg, Oral, Daily with breakfast       Objective:   Physical Exam BP 124/68   Pulse 92   Temp 97.8 F (36.6 C) (Oral)   Resp 16   Ht 5\' 7"  (1.702 m)   Wt 186 lb (84.4 kg)   SpO2 98%   BMI 29.13 kg/m  General:   Well developed, NAD, BMI noted.  HEENT:  Normocephalic . Face symmetric, atraumatic Abdomen:  Not distended, soft, non-tender. No rebound or rigidity.   Skin: Not pale. Not jaundice Lower extremities: no pretibial edema bilaterally  Neurologic:  alert &   pleasantly demented.  Speech is fluent.  Cooperative. gait assisted by walker. Psych: At baseline, Behavior appropriate.No anxious or depressed appearing.      Assessment     Assessment HTN Hyperlipidemia  GERD Depression Dementia: -sxs started shortly after she lost her husband.  MMSE 25 - DX 02/2020: MRI brain no acute, labs negative, Rx Namenda Osteoporosis:  T score plus ankle fracture 03-2019: t  score 2008 -1.8, 2011 -1.8, 2014 -2.1: Fosamax Rx 08-2013 t score 03-2016: -2.1.  10/2020 Tscore  -2.7: Switch from Fosamax to Prolia 03-2023 H/o vit d def  DJD- Aortic stenosis ( at some point diagnosed with HOCM) ---echo 01-2010: "Mild AS but no evidence of HCM or MR.  ---Echo 6-15 , 05-2019 :stable mild AoS. Overactive bladder- pcp started to rx meds 09-2016  Ao [place]: wnl 02-2015 [facility]   PLAN: ER follow-up Dizziness: Workup negative, still feels occasionally dizzy.  She seems very steady today.  No change UTI: Treated with cephalexin.  She seems to be doing better. RTC already scheduled for 10-2023

## 2023-08-03 NOTE — Assessment & Plan Note (Signed)
ER follow-up Dizziness: Workup negative, still feels occasionally dizzy.  She seems very steady today.  No change UTI: Treated with cephalexin.  She seems to be doing better. RTC already scheduled for 10-2023

## 2023-08-03 NOTE — Patient Instructions (Signed)
Drink plenty of water  Continue your regular medicines  See you in February, sooner if needed

## 2023-08-06 ENCOUNTER — Other Ambulatory Visit: Payer: Self-pay | Admitting: Internal Medicine

## 2023-08-06 DIAGNOSIS — H00021 Hordeolum internum right upper eyelid: Secondary | ICD-10-CM | POA: Diagnosis not present

## 2023-08-10 ENCOUNTER — Telehealth: Payer: Self-pay | Admitting: *Deleted

## 2023-08-10 NOTE — Telephone Encounter (Signed)
Transition Care Management Follow-up Telephone Call Date of discharge and from where: [facility]  07/24/2023 How have you been since you were released from the hospital? Feeling better , patient stays in independent living facility  Any questions or concerns? No  Items Reviewed: Did the pt receive and understand the discharge instructions provided? No  Medications obtained and verified? No  Other? No  Any new allergies since your discharge? No  Dietary orders reviewed? No Do you have support at home? Yes     Follow up appointments reviewed:  PCP Hospital f/u appt confirmed? Yes  Scheduled to see An AWV  Are transportation arrangements needed? No  If their condition worsens, is the pt aware to call PCP or go to the Emergency Dept.? Yes Was the patient provided with contact information for the PCP's office or ED? Yes Was to pt encouraged to call back with questions or concerns? Yes

## 2023-08-17 ENCOUNTER — Other Ambulatory Visit: Payer: Self-pay | Admitting: Internal Medicine

## 2023-08-19 ENCOUNTER — Ambulatory Visit: Payer: Medicare Other

## 2023-08-19 DIAGNOSIS — Z0000 Encounter for general adult medical examination without abnormal findings: Secondary | ICD-10-CM

## 2023-08-19 NOTE — Progress Notes (Signed)
Subjective:   [name] is a 87 y.o. female who presents for Medicare Annual (Subsequent) preventive examination.  Visit Complete: Virtual I connected with  Elijio Miles on 08/19/23 by a audio enabled telemedicine application and verified that I am speaking with the correct person using two identifiers.  Patient Location: Home  Provider Location: Office/Clinic  I discussed the limitations of evaluation and management by telemedicine. The patient expressed understanding and agreed to proceed.  Vital Signs: Because this visit was a virtual/telehealth visit, some criteria may be missing or patient reported. Any vitals not documented were not able to be obtained and vitals that have been documented are patient reported.   Cardiac Risk Factors include: advanced age (>64men, >50 women);dyslipidemia;hypertension     Objective:    There were no vitals filed for this visit. There is no height or weight on file to calculate BMI.     08/19/2023    9:03 AM 06/20/2023    8:18 AM 05/11/2022    9:22 AM 05/05/2021   11:08 AM 03/07/2021   11:39 PM 08/11/2020    3:06 PM 07/16/2020    9:55 AM  Advanced Directives  Does Patient Have a Medical Advance Directive? Yes No Yes Yes No Yes Yes  Type of Estate agent of [place];Living will  Healthcare Power of [employer] Power of [employer] Power of [employer] Power of Attorney  Does patient want to make changes to medical advance directive? No - Patient declined     No - Patient declined   Copy of Healthcare Power of Attorney in Chart? No - copy requested  No - copy requested   No - copy requested   Would patient like information on creating a medical advance directive?     No - Patient declined      Current Medications (verified) Outpatient Encounter Medications as of 08/19/2023  Medication Sig   memantine (NAMENDA) 5 MG tablet Take 1 tablet (5 mg total) by mouth 2 (two) times daily.    acetaminophen (TYLENOL) 500 MG tablet Take 500 mg by mouth in the morning and at bedtime.   cholecalciferol (VITAMIN D3) 25 MCG (1000 UNIT) tablet Take 1,000 Units by mouth in the morning and at bedtime.   donepezil (ARICEPT) 5 MG tablet Take 1 tablet (5 mg total) by mouth at bedtime.   Multiple Vitamins-Minerals (HAIR SKIN & NAILS PO) Take 1 tablet by mouth daily.   omeprazole (PRILOSEC) 20 MG capsule TAKE 1 CAPSULE BY MOUTH TWICE  DAILY BEFORE A MEAL (Patient taking differently: Take 20 mg by mouth 2 (two) times daily before a meal.)   simvastatin (ZOCOR) 20 MG tablet Take 1 tablet (20 mg total) by mouth daily.   venlafaxine XR (EFFEXOR XR) 75 MG 24 hr capsule Take 1 capsule (75 mg total) by mouth daily with breakfast.   No facility-administered encounter medications on file as of 08/19/2023.    Allergies (verified) Patient has no known allergies.   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   Depression    GERD (gastroesophageal reflux disease)    History of gastroesophageal reflux (GERD)    Hx of adenomatous colonic polyps    Hx of cardiac cath  2006   neg   Hypertension    Mixed urge and stress incontinence    OAB (overactive bladder) 07/13/2012   Osteoarthritis    Osteopenia    rx fosamax 08-2013   Personal history of colonic polyps - adenomas 04/19/2014   Past Surgical History:  Procedure Laterality Date   ABDOMINAL HYSTERECTOMY  1971   APPENDECTOMY  1971   (at time of hyst?)   BLADDER SURGERY  2003   CATARACT EXTRACTION, BILATERAL     CHOLECYSTECTOMY     OOPHORECTOMY  1971   ROTATOR CUFF REPAIR Right    TOE SURGERY  1995   R 2nd --- correction of claw toe    Family History  Problem Relation Age of Onset   Coronary artery disease  Father 15       had CABG   Cancer Father        gallbladder   Stroke Mother 11   Ovarian cancer Sister 40   Colon cancer Neg Hx        NEGATIVE   Breast cancer Neg Hx        NEGATIVE   Social History   Socioeconomic History   Marital status: Widowed    Spouse name: Not on file   Number of children: 2   Years of education: Not on file   Highest education level: Not on file  Occupational History   Occupation: stays at home     Employer: RETIRED  Tobacco Use   Smoking status: Never   Smokeless tobacco: Never  Vaping Use   Vaping status: Never Used  Substance and Sexual Activity   Alcohol use: Not Currently    Comment: wine rarely   Drug use: No   Sexual activity: Not Currently  Other Topics Concern   Not on file  Social History Narrative   Lives at a  independent living community    Lost Husband 06/2019   2 children, Merchant navy officer (airforce), daughter [place] Bells ([place] police), she is very supportive   Social Determinants of Health   Financial Resource Strain: Low Risk  (08/19/2023)   Overall Financial Resource Strain (CARDIA)    Difficulty of Paying Living Expenses: Not hard at all  Food Insecurity: No Food Insecurity (08/19/2023)   Hunger Vital Sign    Worried About Running Out of Food in the Last Year: Never true    Ran Out of Food in the Last Year: Never true  Transportation Needs: No Transportation Needs (08/19/2023)   PRAPARE - Administrator, Civil Service (Medical): No    Lack of Transportation (Non-Medical): No  Physical Activity: Inactive (08/19/2023)   Exercise Vital Sign    Days of Exercise per Week: 0 days    Minutes of Exercise per Session: 0 min  Stress: No Stress Concern Present (08/19/2023)   [employer] of Occupational Health - Occupational Stress Questionnaire    Feeling of Stress : Not at all  Social Connections: Moderately Isolated (08/19/2023)   Social Connection and Isolation Panel [NHANES]    Frequency of Communication with Friends and  Family: More than three times a week    Frequency of Social Gatherings with Friends and Family: More than three times a week    Attends Religious Services: More than 4 times per year    Active Member of [employer] or Organizations: No    Attends Banker Meetings: Never    Marital Status: Widowed    Tobacco Counseling Counseling given: Not Answered  Clinical Intake:  Pre-visit preparation completed: Yes  Pain : No/denies pain  Nutritional Risks: None Diabetes: No  How often do you need to have someone help you when you read instructions, pamphlets, or other written materials from your doctor or pharmacy?: 1 - Never  Interpreter Needed?: No  Information entered by :: Donne Anon, CMA   Activities of Daily Living    08/19/2023    9:00 AM  In your present state of health, do you have any difficulty performing the following activities:  Hearing? 0  Vision? 0  Difficulty concentrating or making decisions? 1  Comment remembering things  Walking or climbing stairs? 1  Dressing or bathing? 0  Doing errands, shopping? 1  Comment bus from retirement home  Quarry manager and eating ? N  Using the Toilet? N  In the past six months, have you accidently leaked urine? N  Do you have problems with loss of bowel control? N  Managing your Medications? N  Managing your Finances? N  Housekeeping or managing your Housekeeping? N    Patient Care Team: Wanda Plump, MD as PCP - General Jerilee Field, MD as Consulting Physician (Urology) Iva Boop, MD as Consulting Physician (Gastroenterology) Henrene Pastor, RPH-CPP (Pharmacist)  Indicate any recent Medical Services you may have received from other than [facility] providers in the past year (date may be approximate).     Assessment:   This is a routine wellness examination for Toby.  Hearing/Vision screen No results found.   Goals Addressed   None    Depression Screen    08/19/2023    9:05 AM 07/23/2023    10:18 AM 06/15/2023   10:47 AM 04/06/2023   11:39 AM 01/05/2023   10:59 AM 10/06/2022    1:14 PM 07/14/2022    3:43 PM  PHQ 2/9 Scores  PHQ - 2 Score 0 0 0 0 1 1 1   PHQ- 9 Score  0 0 3 2  2     Fall Risk    08/19/2023    9:03 AM 07/23/2023   10:18 AM 06/15/2023   10:46 AM 04/06/2023   11:06 AM 01/05/2023   10:59 AM  Fall Risk   Falls in the past year? 0 1 1 0 0  Number falls in past yr: 0 1 1 0 0  Injury with Fall? 0 0 0 0 0  Risk for fall due to : Impaired balance/gait  Impaired balance/gait    Follow up Falls evaluation completed Falls evaluation completed;Education provided Falls evaluation completed Falls evaluation completed Falls evaluation completed    MEDICARE RISK AT HOME: Medicare Risk at Home Any stairs in or around the home?: Yes If so, are there any without handrails?: No Home free of loose throw rugs in walkways, pet beds, electrical cords, etc?: Yes Adequate lighting in your home to reduce risk of falls?: Yes Life alert?: Yes Use of a cane, walker or w/c?: Yes Grab bars in the bathroom?: Yes Shower chair or bench in shower?: Yes Elevated toilet seat or a handicapped toilet?: Yes  TIMED UP AND GO:  Was the test performed?  No    Cognitive Function:    06/09/2022   10:55 AM 05/02/2020   10:36 AM 02/26/2020    2:47 PM  MMSE - Mini Mental State Exam  Not completed:  Refused   Orientation to time 4  4  Orientation to Place 5  5  Registration 3  3  Attention/ Calculation 0  3  Recall  1  2  Language- name 2 objects 2  2  Language- repeat 1  1  Language- follow 3 step command 3  3  Language- read & follow direction 1  1  Write a sentence 1  1  Copy design 0  0  Total score 21  25        08/19/2023    9:08 AM 05/11/2022    9:34 AM  6CIT Screen  What Year? 0 points 4 points  What month? 3 points 0 points  What time? 0 points 0 points  Count back from 20 4 points 2 points  Months in reverse 4 points 4 points  Repeat phrase 10 points 8 points  Total  Score 21 points 18 points    Immunizations Immunization History  Administered Date(s) Administered   Fluad Quad(high Dose 65+) 06/01/2019, 06/09/2022   Fluad Trivalent(High Dose 65+) 06/15/2023   Influenza Split 07/15/2011, 06/01/2012   Influenza Whole 08/18/2007, 06/15/2008, 06/12/2009, 06/12/2010   Influenza, High Dose Seasonal PF 07/26/2018   Influenza,inj,Quad PF,6+ Mos 06/06/2013, 06/11/2014, 05/24/2015   Influenza-Unspecified 08/14/2016, 06/30/2020   PFIZER Comirnaty(Gray Top)Covid-19 Tri-Sucrose Vaccine 02/21/2021   PFIZER(Purple Top)SARS-COV-2 Vaccination 10/09/2019, 10/30/2019, 06/30/2020   Pfizer Covid-19 Vaccine Bivalent Booster 47yrs & up 06/24/2021   Pneumococcal Conjugate-13 02/14/2014   Pneumococcal Polysaccharide-23 09/15/2003, 11/21/2008, 07/31/2020   Td 06/24/2007   Tdap 05/13/2019   Zoster Recombinant(Shingrix) 10/22/2020, 12/20/2020   Zoster, Live 08/17/2011    TDAP status: Up to date  Flu Vaccine status: Up to date  Pneumococcal vaccine status: Up to date  Covid-19 vaccine status: Information provided on how to obtain vaccines.   Qualifies for Shingles Vaccine? Yes   Zostavax completed Yes   Shingrix Completed?: Yes  Screening Tests Health Maintenance  Topic Date Due   Medicare Annual Wellness (AWV)  05/12/2023   COVID-19 Vaccine (6 - 2023-24 season) 05/16/2023   DTaP/Tdap/Td (3 - Td or Tdap) 05/12/2029   Pneumonia Vaccine 73+ Years old  Completed   INFLUENZA VACCINE  Completed   DEXA SCAN  Completed   Zoster Vaccines- Shingrix  Completed   HPV VACCINES  Aged Out   Colonoscopy  Discontinued    Health Maintenance  Health Maintenance Due  Topic Date Due   Medicare Annual Wellness (AWV)  05/12/2023   COVID-19 Vaccine (6 - 2023-24 season) 05/16/2023    Colorectal cancer screening: No longer required.   Mammogram status: No longer required due to age.  Bone Density status: pt declined  Lung Cancer Screening: (Low Dose CT Chest  recommended if Age 13-80 years, 20 pack-year currently smoking OR have quit w/in 15years.) does not qualify.   Additional Screening:  Hepatitis C Screening: does not qualify  Vision Screening: Recommended annual ophthalmology exams for early detection of glaucoma and other disorders of the eye. Is the patient up to date with their annual eye exam?  Yes  Who is the provider or what is the name of the office in which the patient attends annual eye exams? Can't remember name at this time If pt is not established with a provider, would they like to be referred to a provider to establish care? No .   Dental Screening: Recommended annual dental exams for proper oral hygiene  Diabetic Foot Exam: N/a  Community Resource Referral / Chronic Care Management: CRR required this visit?  No   CCM required this visit?  No     Plan:     I have personally reviewed and noted the following  in the patient's chart:   Medical and social history Use of alcohol, tobacco or illicit drugs  Current medications and supplements including opioid prescriptions. Patient is not currently taking opioid prescriptions. Functional ability and status Nutritional status Physical activity Advanced directives List of other physicians Hospitalizations, surgeries, and ER visits in previous 12 months Vitals Screenings to include cognitive, depression, and falls Referrals and appointments  In addition, I have reviewed and discussed with patient certain preventive protocols, quality metrics, and best practice recommendations. A written personalized care plan for preventive services as well as general preventive health recommendations were provided to patient.     Donne Anon, CMA   08/19/2023   After Visit Summary: (MyChart) Due to this being a telephonic visit, the after visit summary with patients personalized plan was offered to patient via MyChart   Nurse Notes: None

## 2023-08-19 NOTE — Patient Instructions (Signed)
[name] , Thank you for taking time to come for your Medicare Wellness Visit. I appreciate your ongoing commitment to your health goals. Please review the following plan we discussed and let me know if I can assist you in the future.     This is a list of the screening recommended for you and due dates:  Health Maintenance  Topic Date Due   COVID-19 Vaccine (6 - 2023-24 season) 05/16/2023   Medicare Annual Wellness Visit  08/18/2024   DTaP/Tdap/Td vaccine (3 - Td or Tdap) 05/12/2029   Pneumonia Vaccine  Completed   Flu Shot  Completed   DEXA scan (bone density measurement)  Completed   Zoster (Shingles) Vaccine  Completed   HPV Vaccine  Aged Out   Colon Cancer Screening  Discontinued    Next appointment: Follow up in one year for your annual wellness visit.   Preventive Care 85 Years and Older, Female Preventive care refers to lifestyle choices and visits with your health care provider that can promote health and wellness. What does preventive care include? A yearly physical exam. This is also called an annual well check. Dental exams once or twice a year. Routine eye exams. Ask your health care provider how often you should have your eyes checked. Personal lifestyle choices, including: Daily care of your teeth and gums. Regular physical activity. Eating a healthy diet. Avoiding tobacco and drug use. Limiting alcohol use. Practicing safe sex. Taking low-dose aspirin every day. Taking vitamin and mineral supplements as recommended by your health care provider. What happens during an annual well check? The services and screenings done by your health care provider during your annual well check will depend on your age, overall health, lifestyle risk factors, and family history of disease. Counseling  Your health care provider may ask you questions about your: Alcohol use. Tobacco use. Drug use. Emotional well-being. Home and relationship well-being. Sexual  activity. Eating habits. History of falls. Memory and ability to understand (cognition). Work and work Astronomer. Reproductive health. Screening  You may have the following tests or measurements: Height, weight, and BMI. Blood pressure. Lipid and cholesterol levels. These may be checked every 5 years, or more frequently if you are over 85 years old. Skin check. Lung cancer screening. You may have this screening every year starting at age 85 if you have a 30-pack-year history of smoking and currently smoke or have quit within the past 15 years. Fecal occult blood test (FOBT) of the stool. You may have this test every year starting at age 85. Flexible sigmoidoscopy or colonoscopy. You may have a sigmoidoscopy every 5 years or a colonoscopy every 10 years starting at age 85. Hepatitis C blood test. Hepatitis B blood test. Sexually transmitted disease (STD) testing. Diabetes screening. This is done by checking your blood sugar (glucose) after you have not eaten for a while (fasting). You may have this done every 1-3 years. Bone density scan. This is done to screen for osteoporosis. You may have this done starting at age 85. Mammogram. This may be done every 1-2 years. Talk to your health care provider about how often you should have regular mammograms. Talk with your health care provider about your test results, treatment options, and if necessary, the need for more tests. Vaccines  Your health care provider may recommend certain vaccines, such as: Influenza vaccine. This is recommended every year. Tetanus, diphtheria, and acellular pertussis (Tdap, Td) vaccine. You may need a Td booster every 10 years. Zoster vaccine. You  may need this after age 85. Pneumococcal 13-valent conjugate (PCV13) vaccine. One dose is recommended after age 85. Pneumococcal polysaccharide (PPSV23) vaccine. One dose is recommended after age 85. Talk to your health care provider about which screenings and vaccines  you need and how often you need them. This information is not intended to replace advice given to you by your health care provider. Make sure you discuss any questions you have with your health care provider. Document Released: 09/27/2015 Document Revised: 05/20/2016 Document Reviewed: 07/02/2015 Elsevier Interactive Patient Education  2017 [employer].  Fall Prevention in the Home Falls can cause injuries. They can happen to people of all ages. There are many things you can do to make your home safe and to help prevent falls. What can I do on the outside of my home? Regularly fix the edges of walkways and driveways and fix any cracks. Remove anything that might make you trip as you walk through a door, such as a raised step or threshold. Trim any bushes or trees on the path to your home. Use bright outdoor lighting. Clear any walking paths of anything that might make someone trip, such as rocks or tools. Regularly check to see if handrails are loose or broken. Make sure that both sides of any steps have handrails. Any raised decks and porches should have guardrails on the edges. Have any leaves, snow, or ice cleared regularly. Use sand or salt on walking paths during winter. Clean up any spills in your garage right away. This includes oil or grease spills. What can I do in the bathroom? Use night lights. Install grab bars by the toilet and in the tub and shower. Do not use towel bars as grab bars. Use non-skid mats or decals in the tub or shower. If you need to sit down in the shower, use a plastic, non-slip stool. Keep the floor dry. Clean up any water that spills on the floor as soon as it happens. Remove soap buildup in the tub or shower regularly. Attach bath mats securely with double-sided non-slip rug tape. Do not have throw rugs and other things on the floor that can make you trip. What can I do in the bedroom? Use night lights. Make sure that you have a light by your bed that  is easy to reach. Do not use any sheets or blankets that are too big for your bed. They should not hang down onto the floor. Have a firm chair that has side arms. You can use this for support while you get dressed. Do not have throw rugs and other things on the floor that can make you trip. What can I do in the kitchen? Clean up any spills right away. Avoid walking on wet floors. Keep items that you use a lot in easy-to-reach places. If you need to reach something above you, use a strong step stool that has a grab bar. Keep electrical cords out of the way. Do not use floor polish or wax that makes floors slippery. If you must use wax, use non-skid floor wax. Do not have throw rugs and other things on the floor that can make you trip. What can I do with my stairs? Do not leave any items on the stairs. Make sure that there are handrails on both sides of the stairs and use them. Fix handrails that are broken or loose. Make sure that handrails are as long as the stairways. Check any carpeting to make sure that it is firmly  attached to the stairs. Fix any carpet that is loose or worn. Avoid having throw rugs at the top or bottom of the stairs. If you do have throw rugs, attach them to the floor with carpet tape. Make sure that you have a light switch at the top of the stairs and the bottom of the stairs. If you do not have them, ask someone to add them for you. What else can I do to help prevent falls? Wear shoes that: Do not have high heels. Have rubber bottoms. Are comfortable and fit you well. Are closed at the toe. Do not wear sandals. If you use a stepladder: Make sure that it is fully opened. Do not climb a closed stepladder. Make sure that both sides of the stepladder are locked into place. Ask someone to hold it for you, if possible. Clearly mark and make sure that you can see: Any grab bars or handrails. First and last steps. Where the edge of each step is. Use tools that help you  move around (mobility aids) if they are needed. These include: Canes. Walkers. Scooters. Crutches. Turn on the lights when you go into a dark area. Replace any light bulbs as soon as they burn out. Set up your furniture so you have a clear path. Avoid moving your furniture around. If any of your floors are uneven, fix them. If there are any pets around you, be aware of where they are. Review your medicines with your doctor. Some medicines can make you feel dizzy. This can increase your chance of falling. Ask your doctor what other things that you can do to help prevent falls. This information is not intended to replace advice given to you by your health care provider. Make sure you discuss any questions you have with your health care provider. Document Released: 06/27/2009 Document Revised: 02/06/2016 Document Reviewed: 10/05/2014 Elsevier Interactive Patient Education  2017 [employer].

## 2023-09-13 ENCOUNTER — Ambulatory Visit (INDEPENDENT_AMBULATORY_CARE_PROVIDER_SITE_OTHER): Payer: Medicare Other | Admitting: Family Medicine

## 2023-09-13 ENCOUNTER — Encounter: Payer: Self-pay | Admitting: Family Medicine

## 2023-09-13 VITALS — BP 130/80 | HR 97 | Temp 98.6°F | Resp 18 | Ht 67.0 in | Wt 187.4 lb

## 2023-09-13 DIAGNOSIS — J101 Influenza due to other identified influenza virus with other respiratory manifestations: Secondary | ICD-10-CM | POA: Diagnosis not present

## 2023-09-13 LAB — POC COVID19 BINAXNOW: SARS Coronavirus 2 Ag: NEGATIVE

## 2023-09-13 LAB — POC INFLUENZA A&B (BINAX/QUICKVUE)
Influenza A, POC: POSITIVE — AB
Influenza B, POC: NEGATIVE

## 2023-09-13 MED ORDER — OSELTAMIVIR PHOSPHATE 75 MG PO CAPS
75.0000 mg | ORAL_CAPSULE | Freq: Two times a day (BID) | ORAL | 0 refills | Status: DC
Start: 2023-09-13 — End: 2023-12-26

## 2023-09-13 NOTE — Assessment & Plan Note (Signed)
Tamiflu x 5 days  Tylenol for fever as needed Mucinex/ delsym for cough If increase in symptoms return to office or go to UC/ ER

## 2023-09-13 NOTE — Progress Notes (Signed)
Established Patient Office Visit  Subjective   Patient ID: [name], female    DOB: 21-Jun-1936  Age: 87 y.o. MRN: [id number]  Chief Complaint  Patient presents with   Cough    Pt states having a cough since last Friday, non productive, body aches and chills. No COVID test    HPI Discussed the use of AI scribe software for clinical note transcription with the patient, who gave verbal consent to proceed.  History of Present Illness   The patient, residing in an independent living facility, presented with symptoms suggestive of an upper respiratory tract infection, which she initially attributed to a common cold. The onset of symptoms was approximately a week prior, with the most severe symptoms manifesting two days later. The patient reported a fever, though the exact temperature was not measured. Accompanying the fever were generalized body aches and a deep, persistent cough. The cough was non-productive and the patient denied any sensation of expectoration.  Over-the-counter cough medication was taken, though the specific type was not identified. The patient also reported taking Tylenol, presumably for the fever and body aches. Despite these measures, the symptoms persisted. The patient was aware of a recent outbreak of COVID-19 and influenza in her living facility, which prompted the visit for further evaluation.  The patient's past medical history includes hypertension, for which she is on medication. The patient did not report any new medications or changes in her existing regimen. The patient's son-in-law assists with medication management.  The patient's symptoms, in conjunction with the recent outbreak in her living facility, raised concerns for a possible influenza or COVID-19 infection.      Patient Active Problem List   Diagnosis Date Noted   Influenza A 09/13/2023   Pain in both knees 06/15/2023   Vitamin D deficiency 06/15/2023   Hyperglycemia, unspecified 06/15/2023    Dementia (HCC) 02/23/2021   Closed fracture of left distal radius 07/31/2019   Closed nondisplaced fracture of lateral malleolus of right fibula 01/17/2019   PCP NOTES >>>>>>>>>>>>>>>> 09/03/2015   History of colonic polyps 04/19/2014   OAB (overactive bladder) 07/13/2012   Annual physical exam 12/17/2010   Aortic stenosis 01/01/2010   Hyperlipidemia 11/21/2008   GERD 11/28/2007   Depression 06/24/2007   Essential hypertension 06/24/2007   OSTEOARTHRITIS 06/24/2007   Osteoporosis 06/24/2007   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   Depression    GERD (gastroesophageal reflux disease)    History of gastroesophageal reflux (GERD)    Hx of adenomatous colonic polyps    Hx of cardiac cath 2006   neg   Hypertension    Mixed urge and stress incontinence    OAB (overactive bladder) 07/13/2012   Osteoarthritis    Osteopenia    rx fosamax 08-2013   Personal history of colonic polyps - adenomas 04/19/2014   Past Surgical History:  Procedure Laterality Date   ABDOMINAL HYSTERECTOMY  1971   APPENDECTOMY  1971   (at time of hyst?)   BLADDER SURGERY  2003   CATARACT EXTRACTION, BILATERAL     CHOLECYSTECTOMY     OOPHORECTOMY  1971   ROTATOR CUFF REPAIR Right    TOE SURGERY  1995   R 2nd --- correction of  claw toe    Social History   Tobacco Use   Smoking status: Never   Smokeless tobacco: Never  Vaping Use   Vaping status: Never Used  Substance Use Topics   Alcohol use: Not Currently    Comment: wine rarely   Drug use: No   Social History   Socioeconomic History   Marital status: Widowed    Spouse name: Not on file   Number of children: 2   Years of education: Not on file   Highest education level: Not on file  Occupational History    Occupation: stays at home     Employer: RETIRED  Tobacco Use   Smoking status: Never   Smokeless tobacco: Never  Vaping Use   Vaping status: Never Used  Substance and Sexual Activity   Alcohol use: Not Currently    Comment: wine rarely   Drug use: No   Sexual activity: Not Currently  Other Topics Concern   Not on file  Social History Narrative   Lives at a  independent living community    Lost Husband 06/2019   2 children, Merchant navy officer (airforce), daughter [place]ells ([place] police), she is very supportive   Social Drivers of Corporate investment banker Strain: Low Risk  (08/19/2023)   Overall Financial Resource Strain (CARDIA)    Difficulty of Paying Living Expenses: Not hard at all  Food Insecurity: No Food Insecurity (08/19/2023)   Hunger Vital Sign    Worried About Running Out of Food in the Last Year: Never true    Ran Out of Food in the Last Year: Never true  Transportation Needs: No Transportation Needs (08/19/2023)   PRAPARE - Administrator, Civil Service (Medical): No    Lack of Transportation (Non-Medical): No  Physical Activity: Inactive (08/19/2023)   Exercise Vital Sign    Days of Exercise per Week: 0 days    Minutes of Exercise per Session: 0 min  Stress: No Stress Concern Present (08/19/2023)   [employer] of Occupational Health - Occupational Stress Questionnaire    Feeling of Stress : Not at all  Social Connections: Moderately Isolated (08/19/2023)   Social Connection and Isolation Panel [NHANES]    Frequency of Communication with Friends and Family: More than three times a week    Frequency of Social Gatherings with Friends and Family: More than three times a week    Attends Religious Services: More than 4 times per year    Active Member of [employer] or Organizations: No    Attends Banker Meetings: Never    Marital Status: Widowed  Intimate Partner Violence: Not At Risk (08/19/2023)   Humiliation, Afraid, Rape, and Kick questionnaire    Fear  of Current or Ex-Partner: No    Emotionally Abused: No    Physically Abused: No    Sexually Abused: No   Family Status  Relation Name Status   Father  (Not Specified)   Mother  (Not Specified)   Sister  (Not Specified)   Neg Hx  (Not Specified)  No partnership data on file   Family History  Problem Relation Age of Onset   Coronary artery disease Father 16       had CABG   Cancer Father        gallbladder   Stroke Mother 69   Ovarian cancer Sister 75   Colon cancer Neg Hx        NEGATIVE   Breast cancer Neg Hx  NEGATIVE      Review of Systems  Constitutional:  Negative for chills, fever and malaise/fatigue.  HENT:  Negative for congestion and hearing loss.   Eyes:  Negative for blurred vision and discharge.  Respiratory:  Negative for cough, sputum production and shortness of breath.   Cardiovascular:  Negative for chest pain, palpitations and leg swelling.  Gastrointestinal:  Negative for abdominal pain, blood in stool, constipation, diarrhea, heartburn, nausea and vomiting.  Genitourinary:  Negative for dysuria, frequency, hematuria and urgency.  Musculoskeletal:  Negative for back pain, falls and myalgias.  Skin:  Negative for rash.  Neurological:  Negative for dizziness, sensory change, loss of consciousness, weakness and headaches.  Endo/Heme/Allergies:  Negative for environmental allergies. Does not bruise/bleed easily.  Psychiatric/Behavioral:  Negative for depression and suicidal ideas. The patient is not nervous/anxious and does not have insomnia.       Objective:     BP 130/80 (BP Location: Left Arm, Patient Position: Sitting, Cuff Size: Large)   Pulse 97   Temp 98.6 F (37 C) (Oral)   Resp 18   Ht 5\' 7"  (1.702 m)   Wt 187 lb 6.4 oz (85 kg)   SpO2 95%   BMI 29.35 kg/m  BP Readings from Last 3 Encounters:  09/13/23 130/80  08/03/23 124/68  07/24/23 (!) 154/90   Wt Readings from Last 3 Encounters:  09/13/23 187 lb 6.4 oz (85 kg)  08/03/23  186 lb (84.4 kg)  07/24/23 193 lb (87.5 kg)   SpO2 Readings from Last 3 Encounters:  09/13/23 95%  08/03/23 98%  07/24/23 96%      Physical Exam Vitals and nursing note reviewed.  Constitutional:      General: She is not in acute distress.    Appearance: Normal appearance. She is well-developed.  HENT:     Head: Normocephalic and atraumatic.  Eyes:     General: No scleral icterus.       Right eye: No discharge.        Left eye: No discharge.  Cardiovascular:     Rate and Rhythm: Normal rate and regular rhythm.     Heart sounds: No murmur heard. Pulmonary:     Effort: Pulmonary effort is normal. No respiratory distress.     Breath sounds: Normal breath sounds.  Musculoskeletal:        General: Normal range of motion.     Cervical back: Normal range of motion and neck supple.     Right lower leg: No edema.     Left lower leg: No edema.  Skin:    General: Skin is warm and dry.  Neurological:     Mental Status: She is alert and oriented to person, place, and time.  Psychiatric:        Mood and Affect: Mood normal.        Behavior: Behavior normal.        Thought Content: Thought content normal.        Judgment: Judgment normal.      Results for orders placed or performed in visit on 09/13/23  POC COVID-19  Result Value Ref Range   SARS Coronavirus 2 Ag Negative Negative  POC Influenza A&B (Binax test)  Result Value Ref Range   Influenza A, POC Positive (A) Negative   Influenza B, POC Negative Negative    5Last CBC Lab Results  Component Value Date   WBC 4.7 07/24/2023   HGB 12.4 07/24/2023   HCT 37.5 07/24/2023  MCV 93.5 07/24/2023   MCH 30.9 07/24/2023   RDW 12.6 07/24/2023   PLT 222 07/24/2023   Last metabolic panel Lab Results  Component Value Date   GLUCOSE 105 (H) 07/24/2023   NA 139 07/24/2023   K 4.1 07/24/2023   CL 108 07/24/2023   CO2 24 07/24/2023   BUN 20 07/24/2023   CREATININE 1.01 (H) 07/24/2023   GFRNONAA 55 (L) 07/24/2023    CALCIUM 9.6 07/24/2023   PROT 7.2 07/24/2023   ALBUMIN 4.4 07/24/2023   BILITOT 0.8 07/24/2023   ALKPHOS 45 07/24/2023   AST 21 07/24/2023   ALT 21 07/24/2023   ANIONGAP 7 07/24/2023   Last lipids Lab Results  Component Value Date   CHOL 159 06/15/2023   HDL 59.00 06/15/2023   LDLCALC 65 06/15/2023   LDLDIRECT 79.0 06/24/2021   TRIG 173.0 (H) 06/15/2023   CHOLHDL 3 06/15/2023   Last hemoglobin A1c Lab Results  Component Value Date   HGBA1C 5.7 06/15/2023   Last thyroid functions Lab Results  Component Value Date   TSH 2.23 10/06/2022   Last vitamin D Lab Results  Component Value Date   VD25OH 28.01 (L) 06/15/2023   Last vitamin B12 and Folate Lab Results  Component Value Date   VITAMINB12 382 02/26/2020   FOLATE >24.8 02/26/2020      The ASCVD Risk score (Arnett DK, et al., 2019) failed to calculate for the following reasons:   The 2019 ASCVD risk score is only valid for ages 20 to 71    Assessment & Plan:   Problem List Items Addressed This Visit       Unprioritized   Influenza A - Primary   Tamiflu x 5 days  Tylenol for fever as needed Mucinex/ delsym for cough If increase in symptoms return to office or go to UC/ ER      Relevant Medications   oseltamivir (TAMIFLU) 75 MG capsule   Other Relevant Orders   POC COVID-19 (Completed)   POC Influenza A&B (Binax test) (Completed)  Assessment and Plan    Influenza A   Confirmed acute onset of influenza A, with symptoms starting last Thursday or Friday, including fever, myalgia, and a deep cough. She resides in an independent living facility currently experiencing an outbreak of COVID-19 and influenza. She has been managing symptoms with OTC cough medicine and acetaminophen. We discussed the benefits of antiviral medication to reduce symptom duration, which is most effective if taken within 72 hours of symptom onset, and the potential for secondary bacterial infections. We emphasized the importance of  quarantine and mask-wearing to prevent spread, noting the severity and potential fatality of influenza. We will prescribe antiviral medication and advise the continued use of OTC cough medicine such as Mucinex or Delsym. We instructed her to stay in quarantine until she is afebrile for 24 hours without the use of antipyretics and to wear a mask when leaving her room. The prescription will be sent to the [facility] pharmacy.  General Health Maintenance   We reviewed preventive measures for influenza spread, advising her to quarantine until afebrile for 24 hours without antipyretics and recommending mask-wearing when leaving her room.        No follow-ups on file.    Donato Schultz, DO

## 2023-10-26 ENCOUNTER — Other Ambulatory Visit: Payer: Self-pay | Admitting: Internal Medicine

## 2023-10-29 ENCOUNTER — Ambulatory Visit: Payer: Medicare Other | Admitting: Internal Medicine

## 2023-11-10 ENCOUNTER — Other Ambulatory Visit: Payer: Self-pay | Admitting: Internal Medicine

## 2023-11-21 ENCOUNTER — Emergency Department (HOSPITAL_COMMUNITY)

## 2023-11-21 ENCOUNTER — Encounter (HOSPITAL_COMMUNITY): Payer: Self-pay

## 2023-11-21 ENCOUNTER — Emergency Department (HOSPITAL_COMMUNITY)
Admission: EM | Admit: 2023-11-21 | Discharge: 2023-11-21 | Disposition: A | Discharge status: Home / Self Care | Attending: Emergency Medicine | Admitting: Emergency Medicine

## 2023-11-21 ENCOUNTER — Other Ambulatory Visit: Payer: Self-pay

## 2023-11-21 DIAGNOSIS — W19XXXA Unspecified fall, initial encounter: Secondary | ICD-10-CM | POA: Insufficient documentation

## 2023-11-21 DIAGNOSIS — M47812 Spondylosis without myelopathy or radiculopathy, cervical region: Secondary | ICD-10-CM | POA: Diagnosis not present

## 2023-11-21 DIAGNOSIS — S199XXA Unspecified injury of neck, initial encounter: Secondary | ICD-10-CM | POA: Diagnosis not present

## 2023-11-21 DIAGNOSIS — F0390 Unspecified dementia without behavioral disturbance: Secondary | ICD-10-CM | POA: Diagnosis not present

## 2023-11-21 DIAGNOSIS — E041 Nontoxic single thyroid nodule: Secondary | ICD-10-CM | POA: Diagnosis not present

## 2023-11-21 DIAGNOSIS — I10 Essential (primary) hypertension: Secondary | ICD-10-CM | POA: Diagnosis not present

## 2023-11-21 DIAGNOSIS — R55 Syncope and collapse: Secondary | ICD-10-CM | POA: Diagnosis not present

## 2023-11-21 DIAGNOSIS — Z743 Need for continuous supervision: Secondary | ICD-10-CM | POA: Diagnosis not present

## 2023-11-21 DIAGNOSIS — R404 Transient alteration of awareness: Secondary | ICD-10-CM | POA: Diagnosis not present

## 2023-11-21 DIAGNOSIS — M4802 Spinal stenosis, cervical region: Secondary | ICD-10-CM | POA: Diagnosis not present

## 2023-11-21 DIAGNOSIS — S0990XA Unspecified injury of head, initial encounter: Secondary | ICD-10-CM | POA: Diagnosis not present

## 2023-11-21 DIAGNOSIS — R42 Dizziness and giddiness: Secondary | ICD-10-CM | POA: Diagnosis not present

## 2023-11-21 DIAGNOSIS — I447 Left bundle-branch block, unspecified: Secondary | ICD-10-CM | POA: Diagnosis not present

## 2023-11-21 DIAGNOSIS — N390 Urinary tract infection, site not specified: Secondary | ICD-10-CM | POA: Diagnosis not present

## 2023-11-21 LAB — CBC
HCT: 38.1 % (ref 36.0–46.0)
Hemoglobin: 12.5 g/dL (ref 12.0–15.0)
MCH: 30.5 pg (ref 26.0–34.0)
MCHC: 32.8 g/dL (ref 30.0–36.0)
MCV: 92.9 fL (ref 80.0–100.0)
Platelets: 224 10*3/uL (ref 150–400)
RBC: 4.1 MIL/uL (ref 3.87–5.11)
RDW: 11.9 % (ref 11.5–15.5)
WBC: 9.8 10*3/uL (ref 4.0–10.5)
nRBC: 0 % (ref 0.0–0.2)

## 2023-11-21 LAB — URINALYSIS, ROUTINE W REFLEX MICROSCOPIC
Bilirubin Urine: NEGATIVE
Glucose, UA: NEGATIVE mg/dL
Hgb urine dipstick: NEGATIVE
Ketones, ur: NEGATIVE mg/dL
Nitrite: POSITIVE — AB
Protein, ur: 30 mg/dL — AB
Specific Gravity, Urine: 1.018 (ref 1.005–1.030)
WBC, UA: >50 WBC/hpf (ref 0–5)
pH: 5 (ref 5.0–8.0)

## 2023-11-21 LAB — TROPONIN I (HIGH SENSITIVITY)
Troponin I (High Sensitivity): 15 ng/L (ref <18)
Troponin I (High Sensitivity): 15 ng/L (ref <18)

## 2023-11-21 LAB — BASIC METABOLIC PANEL
Anion gap: 10 (ref 5–15)
BUN: 28 mg/dL — ABNORMAL HIGH (ref 8–23)
CO2: 24 mmol/L (ref 22–32)
Calcium: 9.5 mg/dL (ref 8.9–10.3)
Chloride: 107 mmol/L (ref 98–111)
Creatinine, Ser: 0.92 mg/dL (ref 0.44–1.00)
GFR, Estimated: >60 mL/min (ref >60)
Glucose, Bld: 102 mg/dL — ABNORMAL HIGH (ref 70–99)
Potassium: 3.8 mmol/L (ref 3.5–5.1)
Sodium: 141 mmol/L (ref 135–145)

## 2023-11-21 LAB — CK: Total CK: 126 U/L (ref 38–234)

## 2023-11-21 MED ORDER — CEPHALEXIN 500 MG PO CAPS: 500.0000 mg | ORAL_CAPSULE | Freq: Three times a day (TID) | ORAL | 0 refills | Status: DC

## 2023-11-21 MED ORDER — CEPHALEXIN 250 MG PO CAPS
500.0000 mg | ORAL_CAPSULE | Freq: Once | ORAL | Status: AC
  Administered 2023-11-21: 500 mg via ORAL
  Filled 2023-11-21: qty 2

## 2023-11-21 NOTE — ED Triage Notes (Signed)
 Per EMS, Pt, from [employer], c/o sliding out of bed this morning and intermittent dizziness x"years."  Pt denies hitting head and LOC.  Pt is A&Ox2. Hx of dementia.

## 2023-11-21 NOTE — ED Notes (Signed)
 Patient transported to CT

## 2023-11-21 NOTE — ED Notes (Signed)
 Pt has ecchymosis on posterior right hip and right side of back

## 2023-11-21 NOTE — ED Notes (Signed)
 Pot dose not have to void at this time.

## 2023-11-21 NOTE — ED Provider Notes (Signed)
 [place] EMERGENCY DEPARTMENT AT [facility] Provider Note   CSN: [id number] Arrival date & time: 11/21/23  4098     History {Add pertinent medical, surgical, social history, OB history to HPI:1} Chief Complaint  Patient presents with   Fall   Dizziness    [name] is a 88 y.o. female.   Fall  Dizziness    Patient resides at an independent living facility.  She has history of hypertension depression osteoarthritis aortic stenosis acid reflux mild dementia.  Patient had a fall this morning.  This was not witnessed.  Patient is not exactly sure what happened.  She cannot tell me if she passed out or fell.  She does not know how long she was on the floor.  Initial report from EMS was the patient initially complained of sliding out of bed in the morning.  Initially denied any head injury or loss of consciousness.  Patient was sent to the ED for evaluation.  Patient's not currently having any headache at this time.  No chest pain no abdominal pain.  Home Medications Prior to Admission medications   Medication Sig Start Date End Date Taking? Authorizing Provider  acetaminophen (TYLENOL) 500 MG tablet Take 500 mg by mouth in the morning and at bedtime. 08/28/19   [provider]  cholecalciferol (VITAMIN D3) 25 MCG (1000 UNIT) tablet Take 1,000 Units by mouth in the morning and at bedtime.    [provider]  donepezil (ARICEPT) 5 MG tablet Take 1 tablet (5 mg total) by mouth at bedtime. 06/15/23   Sandford Craze, NP  memantine (NAMENDA) 5 MG tablet Take 1 tablet (5 mg total) by mouth 2 (two) times daily. 10/27/23   Wanda Plump, MD  Multiple Vitamins-Minerals (HAIR SKIN & NAILS PO) Take 1 tablet by mouth daily.    [provider]  omeprazole (PRILOSEC) 20 MG capsule Take 1 capsule (20 mg total) by mouth 2 (two) times daily before a meal. 10/27/23   Wanda Plump, MD  oseltamivir (TAMIFLU) 75 MG capsule Take 1 capsule (75 mg total) by mouth 2  (two) times daily. 09/13/23   Donato Schultz, DO  simvastatin (ZOCOR) 20 MG tablet Take 1 tablet (20 mg total) by mouth daily. 08/09/23   Wanda Plump, MD  venlafaxine XR (EFFEXOR XR) 75 MG 24 hr capsule Take 1 capsule (75 mg total) by mouth daily with breakfast. 07/23/23   Wanda Plump, MD      Allergies    Patient has no known allergies.    Review of Systems   Review of Systems  Neurological:  Positive for dizziness.    Physical Exam Updated Vital Signs BP (!) 142/84   Pulse 93   Temp 98 F (36.7 C) (Oral)   Resp (!) 24   Ht 1.702 m (5\' 7" )   Wt 84.8 kg   SpO2 95%   BMI 29.29 kg/m  Physical Exam Vitals and nursing note reviewed.  Constitutional:      General: She is not in acute distress.    Appearance: She is well-developed.  HENT:     Head: Normocephalic and atraumatic.     Right Ear: External ear normal.     Left Ear: External ear normal.  Eyes:     General: No scleral icterus.       Right eye: No discharge.        Left eye: No discharge.     Conjunctiva/sclera: Conjunctivae normal.  Neck:     Trachea: No tracheal deviation.  Cardiovascular:     Rate and Rhythm: Normal rate and regular rhythm.  Pulmonary:     Effort: Pulmonary effort is normal. No respiratory distress.     Breath sounds: Normal breath sounds. No stridor. No wheezing or rales.  Abdominal:     General: Bowel sounds are normal. There is no distension.     Palpations: Abdomen is soft.     Tenderness: There is no abdominal tenderness. There is no guarding or rebound.  Musculoskeletal:        General: No tenderness or deformity.     Cervical back: Neck supple.     Comments: No tenderness palpation bilateral upper extremities or lower extremities, no tenderness palpation of the cervical thoracic or lumbar spine  Skin:    General: Skin is warm and dry.     Findings: No rash.  Neurological:     General: No focal deficit present.     Mental Status: She is alert. Mental status is at baseline.      Cranial Nerves: No cranial nerve deficit, dysarthria or facial asymmetry.     Sensory: No sensory deficit.     Motor: No abnormal muscle tone or seizure activity.     Coordination: Coordination normal.     Comments: No facial droop, equal grip strength bilaterally, able to lift both legs off the bed without difficulty  Psychiatric:        Mood and Affect: Mood normal.     ED Results / Procedures / Treatments   Labs (all labs ordered are listed, but only abnormal results are displayed) Labs Reviewed  BASIC METABOLIC PANEL - Abnormal; Notable for the following components:      Result Value   Glucose, Bld 102 (*)    BUN 28 (*)    All other components within normal limits  CBC  URINALYSIS, ROUTINE W REFLEX MICROSCOPIC    EKG None  Radiology No results found.  Procedures Procedures  {Document cardiac monitor, telemetry assessment procedure when appropriate:1}  Medications Ordered in ED Medications - No data to display  ED Course/ Medical Decision Making/ A&P   {   Click here for ABCD2, HEART and other calculatorsREFRESH Note before signing :1}                              Medical Decision Making Amount and/or Complexity of Data Reviewed Labs: ordered. Radiology: ordered.   ***  {Document critical care time when appropriate:1} {Document review of labs and clinical decision tools ie heart score, Chads2Vasc2 etc:1}  {Document your independent review of radiology images, and any outside records:1} {Document your discussion with family members, caretakers, and with consultants:1} {Document social determinants of health affecting pt's care:1} {Document your decision making why or why not admission, treatments were needed:1} Final Clinical Impression(s) / ED Diagnoses Final diagnoses:  None    Rx / DC Orders ED Discharge Orders     None

## 2023-11-21 NOTE — Discharge Instructions (Signed)
 Take the antibiotics as prescribed.  Follow-up with your doctor next week to be rechecked.  Return to the ER for fevers chills weakness or other concerning symptoms

## 2023-11-21 NOTE — ED Notes (Signed)
 Pt did okay walking with rollator

## 2023-11-21 NOTE — ED Provider Notes (Signed)
 3:52 PM Assumed care of patient from off-going team. For more details, please see note from same day.  In brief, this is a 88 y.o. female who had a fall today, maybe sliding out of bed this morning at her ALF. No localizing symptoms of pain or infection. Found to have UTI and will DC w/ abx  Plan/Dispo at time of sign-out & ED Course since sign-out: [ ]  ambulate  BP (!) 150/67   Pulse 82   Temp 97.6 F (36.4 C) (Axillary)   Resp 20   Ht 5\' 7"  (1.702 m)   Wt 84.8 kg   SpO2 100%   BMI 29.29 kg/m    ED Course:   Clinical Course as of 11/21/23 1552  Sun Nov 21, 2023  1307 Urinalysis, Routine w reflex microscopic -Urine, Clean Catch(!) Urinalysis does show greater than 50 white blood cells large leukocyte esterase and nitrate suggestive of possible infection [JK]  1307 CBC CBC normal.  Metabolic panel showed increased BUN but this has been noted previously [JK]  1448 CK normal.  Troponin normal. [JK]  1448 Hip x-ray and chest x-ray without acute abnormality [JK]    Clinical Course User Index [JK] Linwood Dibbles, MD   Patient ambulated with her home rollator without difficulty. She is asking to be discharged. DC'd with printed prescription for cephalexin. DC w/ discharge instructions/return precautions. All questions answered to patient's satisfaction.     Dispo: DC ------------------------------- Vivi Barrack, MD Emergency Medicine  This note was created using dictation software, which may contain spelling or grammatical errors.   Loetta Rough, MD 11/21/23 [phone]

## 2023-11-23 LAB — URINE CULTURE: Culture: >=100000 — AB

## 2023-11-24 ENCOUNTER — Telehealth (HOSPITAL_BASED_OUTPATIENT_CLINIC_OR_DEPARTMENT_OTHER): Payer: Self-pay

## 2023-11-24 NOTE — Telephone Encounter (Signed)
 Post ED Visit - Positive Culture Follow-up  Culture report reviewed by antimicrobial stewardship pharmacist: [facility] [x]  Daylene Posey, Pharm.D. []  Celedonio Miyamoto, Pharm.D., BCPS AQ-ID []  Garvin Fila, Pharm.D., BCPS []  Georgina Pillion, Pharm.D., BCPS []  [place], [address].D., BCPS, AAHIVP []  Estella Husk, Pharm.D., BCPS, AAHIVP []  Lysle Pearl, PharmD, BCPS []  Phillips Climes, PharmD, BCPS []  Agapito Games, PharmD, BCPS []  Verlan Friends, PharmD []  Mervyn Gay, PharmD, BCPS []  Vinnie Level, PharmD  [facility] []  Len Childs, PharmD []  Greer Pickerel, PharmD []  Adalberto Shanley, PharmD []  Perlie Gold, Rph []  Lonell Face) Jean Rosenthal, PharmD []  Earl Many, PharmD []  Junita Push, PharmD []  Dorna Leitz, PharmD []  Terrilee Files, PharmD []  Lynann Beaver, PharmD []  Keturah Barre, PharmD []  Loralee Pacas, PharmD []  Bernadene Person, PharmD   Positive urine culture Treated with Cephalexin, organism sensitive to the same and no further patient follow-up is required at this time.  Sandria Senter 11/24/2023, 8:32 AM

## 2023-12-02 ENCOUNTER — Ambulatory Visit: Payer: Medicare Other | Admitting: Dermatology

## 2023-12-02 ENCOUNTER — Encounter: Payer: Self-pay | Admitting: Dermatology

## 2023-12-02 VITALS — BP 126/78 | HR 68

## 2023-12-02 DIAGNOSIS — L570 Actinic keratosis: Secondary | ICD-10-CM

## 2023-12-02 DIAGNOSIS — L820 Inflamed seborrheic keratosis: Secondary | ICD-10-CM | POA: Diagnosis not present

## 2023-12-02 DIAGNOSIS — L821 Other seborrheic keratosis: Secondary | ICD-10-CM | POA: Diagnosis not present

## 2023-12-02 DIAGNOSIS — W908XXA Exposure to other nonionizing radiation, initial encounter: Secondary | ICD-10-CM

## 2023-12-02 DIAGNOSIS — Z85828 Personal history of other malignant neoplasm of skin: Secondary | ICD-10-CM | POA: Diagnosis not present

## 2023-12-02 NOTE — Progress Notes (Signed)
 New Patient Visit   Subjective  [name] is a 88 y.o. female who presents for the following: concerned about a spot. Hx of NMSC . No family hx of skin cancer. She is accompanied by her daughter. Lesion of concern on her left wrist, present for years, not changing or growing but slightly irritated.   The patient has spots, moles and lesions to be evaluated, some may be new or changing.   The following portions of the chart were reviewed this encounter and updated as appropriate: medications, allergies, medical history  Review of Systems:  No other skin or systemic complaints except as noted in HPI or Assessment and Plan.  Objective  Well appearing patient in no apparent distress; mood and affect are within normal limits.    A focused examination was performed of the following areas:  Bilateral Forearms Face Back  Relevant exam findings are noted in the Assessment and Plan.  Left Forearm - Posterior, Left Wrist - Posterior Stuck on scaly papules and plaques  Assessment & Plan   SEBORRHEIC KERATOSIS - Stuck-on, waxy, tan-brown papules and/or plaques  - Benign-appearing - Discussed benign etiology and prognosis. - Observe - Call for any changes  ACTINIC KERATOSIS Exam: Erythematous thin papules/macules with gritty scale  Actinic keratoses are precancerous spots that appear secondary to cumulative UV radiation exposure/sun exposure over time. They are chronic with expected duration over 1 year. A portion of actinic keratoses will progress to squamous cell carcinoma of the skin. It is not possible to reliably predict which spots will progress to skin cancer and so treatment is recommended to prevent development of skin cancer.  Recommend daily broad spectrum sunscreen SPF 30+ to sun-exposed areas, reapply every 2 hours as needed.  Recommend staying in the shade or wearing long sleeves, sun glasses (UVA+UVB protection) and wide brim hats (4-inch brim around the entire  circumference of the hat). Call for new or changing lesions.  Treatment Plan:  Prior to procedure, discussed risks of blister formation, small wound, skin dyspigmentation, or rare scar following cryotherapy. Recommend Vaseline ointment to treated areas while healing.  Destruction Procedure Note Destruction method: cryotherapy   Informed consent: discussed and consent obtained   Lesion destroyed using liquid nitrogen: Yes   Outcome: patient tolerated procedure well with no complications   Post-procedure details: wound care instructions given   Locations: left hand 2, right forearm x2 # of Lesions Treated: 4  INFLAMED SEBORRHEIC KERATOSIS (2) Left Forearm - Posterior, Left Wrist - Posterior  Destruction of lesion - Left Forearm - Posterior, Left Wrist - Posterior Complexity: simple   Destruction method: cryotherapy   Informed consent: discussed and consent obtained   Timeout:  patient name, date of birth, surgical site, and procedure verified Lesion destroyed using liquid nitrogen: Yes   Region frozen until ice ball extended beyond lesion: Yes   Cryotherapy cycles:  2 AK (ACTINIC KERATOSIS) (4) Left Dorsal Hand (2), Right Forearm - Anterior, Right Forearm - Posterior Destruction of lesion - Left Dorsal Hand, Right Forearm - Anterior, Right Forearm - Posterior Complexity: extensive   Destruction method: cryotherapy   Timeout:  patient name, date of birth, surgical site, and procedure verified Lesion destroyed using liquid nitrogen: Yes   Region frozen until ice ball extended beyond lesion: Yes   Cryotherapy cycles:  2 Outcome: patient tolerated procedure well with no complications   Post-procedure details: wound care instructions given    Return if symptoms worsen or fail to improve.  Myrtice Lauth  Allyson Sabal, Surg Tech III, am acting as scribe for Gwenith Daily, MD.   Documentation: I have reviewed the above documentation for accuracy and completeness, and I agree with the  above.  Gwenith Daily, MD

## 2023-12-02 NOTE — Patient Instructions (Signed)
 Important Information  Due to recent changes in healthcare laws, you may see results of your pathology and/or laboratory studies on MyChart before the doctors have had a chance to review them. We understand that in some cases there may be results that are confusing or concerning to you. Please understand that not all results are received at the same time and often the doctors may need to interpret multiple results in order to provide you with the best plan of care or course of treatment. Therefore, we ask that you please give [place] 2 business days to thoroughly review all your results before contacting the office for clarification. Should we see a critical lab result, you will be contacted sooner.   If You Need Anything After Your Visit  If you have any questions or concerns for your doctor, please call our main line at [phone] If no one answers, please leave a voicemail as directed and we will return your call as soon as possible. Messages left after 4 pm will be answered the following business day.   You may also send [place] a message via MyChart. We typically respond to MyChart messages within 1-2 business days.  For prescription refills, please ask your pharmacy to contact our office. Our fax number is [phone].  If you have an urgent issue when the clinic is closed that cannot wait until the next business day, you can page your doctor at the number below.    Please note that while we do our best to be available for urgent issues outside of office hours, we are not available 24/7.   If you have an urgent issue and are unable to reach [place], you may choose to seek medical care at your doctor's office, retail clinic, urgent care center, or emergency room.  If you have a medical emergency, please immediately call 911 or go to the emergency department. In the event of inclement weather, please call our main line at [phone] for an update on the status of any delays or  closures.  Dermatology Medication Tips: Please keep the boxes that topical medications come in in order to help keep track of the instructions about where and how to use these. Pharmacies typically print the medication instructions only on the boxes and not directly on the medication tubes.   If your medication is too expensive, please contact our office at [phone] or send [place] a message through MyChart.   We are unable to tell what your co-pay for medications will be in advance as this is different depending on your insurance coverage. However, we may be able to find a substitute medication at lower cost or fill out paperwork to get insurance to cover a needed medication.   If a prior authorization is required to get your medication covered by your insurance company, please allow [place] 1-2 business days to complete this process.  Drug prices often vary depending on where the prescription is filled and some pharmacies may offer cheaper prices.  The website www.goodrx.com contains coupons for medications through different pharmacies. The prices here do not account for what the cost may be with help from insurance (it may be cheaper with your insurance), but the website can give you the price if you did not use any insurance.  - You can print the associated coupon and take it with your prescription to the pharmacy.  - You may also stop by our office during regular business hours and pick up a GoodRx coupon card.  - If  you need your prescription sent electronically to a different pharmacy, notify our office through [facility] or by phone at [phone]    Skin Education :   I counseled the patient regarding the following: Sun screen (SPF 30 or greater) should be applied during peak UV exposure (between 10am and 2pm) and reapplied after exercise or swimming.  The ABCDEs of melanoma were reviewed with the patient, and the importance of monthly self-examination of moles was emphasized.  Should any moles change in shape or color, or itch, bleed or burn, pt will contact our office for evaluation sooner then their interval appointment.  Plan: Sunscreen Recommendations I recommended a broad spectrum sunscreen with a SPF of 30 or higher. I explained that SPF 30 sunscreens block approximately 97 percent of the sun's harmful rays. Sunscreens should be applied at least 15 minutes prior to expected sun exposure and then every 2 hours after that as long as sun exposure continues. If swimming or exercising sunscreen should be reapplied every 45 minutes to an hour after getting wet or sweating. One ounce, or the equivalent of a shot glass full of sunscreen, is adequate to protect the skin not covered by a bathing suit. I also recommended a lip balm with a sunscreen as well. Sun protective clothing can be used in lieu of sunscreen but must be worn the entire time you are exposed to the sun's rays. For areas treated with Liquid Nitrogen:  Keep clean with soap and water.  Apply Vaseline or Aquaphor twice daily.

## 2023-12-25 ENCOUNTER — Observation Stay (HOSPITAL_BASED_OUTPATIENT_CLINIC_OR_DEPARTMENT_OTHER): Admission: EM | Admit: 2023-12-25 | Discharge: 2023-12-28 | Disposition: A | Discharge status: Skilled Nursing Facility

## 2023-12-25 ENCOUNTER — Emergency Department (HOSPITAL_BASED_OUTPATIENT_CLINIC_OR_DEPARTMENT_OTHER)

## 2023-12-25 ENCOUNTER — Other Ambulatory Visit: Payer: Self-pay

## 2023-12-25 ENCOUNTER — Observation Stay (HOSPITAL_COMMUNITY)

## 2023-12-25 ENCOUNTER — Encounter (HOSPITAL_BASED_OUTPATIENT_CLINIC_OR_DEPARTMENT_OTHER): Payer: Self-pay | Admitting: Emergency Medicine

## 2023-12-25 DIAGNOSIS — I951 Orthostatic hypotension: Secondary | ICD-10-CM | POA: Diagnosis not present

## 2023-12-25 DIAGNOSIS — F0390 Unspecified dementia without behavioral disturbance: Secondary | ICD-10-CM | POA: Diagnosis not present

## 2023-12-25 DIAGNOSIS — I10 Essential (primary) hypertension: Secondary | ICD-10-CM | POA: Diagnosis not present

## 2023-12-25 DIAGNOSIS — E785 Hyperlipidemia, unspecified: Secondary | ICD-10-CM | POA: Insufficient documentation

## 2023-12-25 DIAGNOSIS — I5043 Acute on chronic combined systolic (congestive) and diastolic (congestive) heart failure: Secondary | ICD-10-CM | POA: Diagnosis not present

## 2023-12-25 DIAGNOSIS — G8929 Other chronic pain: Secondary | ICD-10-CM

## 2023-12-25 DIAGNOSIS — I493 Ventricular premature depolarization: Secondary | ICD-10-CM | POA: Diagnosis not present

## 2023-12-25 DIAGNOSIS — E7849 Other hyperlipidemia: Secondary | ICD-10-CM | POA: Diagnosis not present

## 2023-12-25 DIAGNOSIS — I509 Heart failure, unspecified: Secondary | ICD-10-CM

## 2023-12-25 DIAGNOSIS — M549 Dorsalgia, unspecified: Secondary | ICD-10-CM | POA: Diagnosis not present

## 2023-12-25 DIAGNOSIS — S3992XA Unspecified injury of lower back, initial encounter: Secondary | ICD-10-CM | POA: Diagnosis not present

## 2023-12-25 DIAGNOSIS — K5900 Constipation, unspecified: Secondary | ICD-10-CM | POA: Diagnosis not present

## 2023-12-25 DIAGNOSIS — R519 Headache, unspecified: Secondary | ICD-10-CM | POA: Diagnosis not present

## 2023-12-25 DIAGNOSIS — R2681 Unsteadiness on feet: Secondary | ICD-10-CM | POA: Insufficient documentation

## 2023-12-25 DIAGNOSIS — R42 Dizziness and giddiness: Secondary | ICD-10-CM | POA: Diagnosis not present

## 2023-12-25 DIAGNOSIS — M545 Low back pain, unspecified: Secondary | ICD-10-CM | POA: Diagnosis not present

## 2023-12-25 DIAGNOSIS — Z79899 Other long term (current) drug therapy: Secondary | ICD-10-CM | POA: Insufficient documentation

## 2023-12-25 DIAGNOSIS — I110 Hypertensive heart disease with heart failure: Secondary | ICD-10-CM | POA: Insufficient documentation

## 2023-12-25 DIAGNOSIS — Z85828 Personal history of other malignant neoplasm of skin: Secondary | ICD-10-CM | POA: Insufficient documentation

## 2023-12-25 DIAGNOSIS — R5383 Other fatigue: Secondary | ICD-10-CM | POA: Diagnosis not present

## 2023-12-25 DIAGNOSIS — K5909 Other constipation: Secondary | ICD-10-CM | POA: Diagnosis not present

## 2023-12-25 DIAGNOSIS — I700 Atherosclerosis of aorta: Secondary | ICD-10-CM | POA: Diagnosis not present

## 2023-12-25 DIAGNOSIS — I6782 Cerebral ischemia: Secondary | ICD-10-CM | POA: Diagnosis not present

## 2023-12-25 DIAGNOSIS — M51369 Other intervertebral disc degeneration, lumbar region without mention of lumbar back pain or lower extremity pain: Secondary | ICD-10-CM | POA: Diagnosis not present

## 2023-12-25 DIAGNOSIS — I5022 Chronic systolic (congestive) heart failure: Secondary | ICD-10-CM

## 2023-12-25 LAB — URINALYSIS, ROUTINE W REFLEX MICROSCOPIC
Bilirubin Urine: NEGATIVE
Glucose, UA: NEGATIVE mg/dL
Hgb urine dipstick: NEGATIVE
Ketones, ur: NEGATIVE mg/dL
Leukocytes,Ua: NEGATIVE
Nitrite: NEGATIVE
Protein, ur: NEGATIVE mg/dL
Specific Gravity, Urine: 1.005 (ref 1.005–1.030)
pH: 7 (ref 5.0–8.0)

## 2023-12-25 LAB — COMPREHENSIVE METABOLIC PANEL WITH GFR
ALT: 14 U/L (ref 0–44)
AST: 17 U/L (ref 15–41)
Albumin: 4.5 g/dL (ref 3.5–5.0)
Alkaline Phosphatase: 45 U/L (ref 38–126)
Anion gap: 9 (ref 5–15)
BUN: 21 mg/dL (ref 8–23)
CO2: 27 mmol/L (ref 22–32)
Calcium: 10.1 mg/dL (ref 8.9–10.3)
Chloride: 106 mmol/L (ref 98–111)
Creatinine, Ser: 0.9 mg/dL (ref 0.44–1.00)
GFR, Estimated: >60 mL/min (ref >60)
Glucose, Bld: 98 mg/dL (ref 70–99)
Potassium: 3.8 mmol/L (ref 3.5–5.1)
Sodium: 142 mmol/L (ref 135–145)
Total Bilirubin: 0.4 mg/dL (ref 0.0–1.2)
Total Protein: 7.2 g/dL (ref 6.5–8.1)

## 2023-12-25 LAB — CBC WITH DIFFERENTIAL/PLATELET
Abs Immature Granulocytes: 0.03 10*3/uL (ref 0.00–0.07)
Basophils Absolute: 0.1 10*3/uL (ref 0.0–0.1)
Basophils Relative: 1 %
Eosinophils Absolute: 0.2 10*3/uL (ref 0.0–0.5)
Eosinophils Relative: 4 %
HCT: 36.6 % (ref 36.0–46.0)
Hemoglobin: 12 g/dL (ref 12.0–15.0)
Immature Granulocytes: 1 %
Lymphocytes Relative: 29 %
Lymphs Abs: 1.4 10*3/uL (ref 0.7–4.0)
MCH: 30.2 pg (ref 26.0–34.0)
MCHC: 32.8 g/dL (ref 30.0–36.0)
MCV: 92.2 fL (ref 80.0–100.0)
Monocytes Absolute: 0.5 10*3/uL (ref 0.1–1.0)
Monocytes Relative: 10 %
Neutro Abs: 2.7 10*3/uL (ref 1.7–7.7)
Neutrophils Relative %: 55 %
Platelets: 215 10*3/uL (ref 150–400)
RBC: 3.97 MIL/uL (ref 3.87–5.11)
RDW: 12.4 % (ref 11.5–15.5)
WBC: 4.8 10*3/uL (ref 4.0–10.5)
nRBC: 0 % (ref 0.0–0.2)

## 2023-12-25 LAB — MAGNESIUM: Magnesium: 2.2 mg/dL (ref 1.7–2.4)

## 2023-12-25 LAB — RESP PANEL BY RT-PCR (RSV, FLU A&B, COVID)  RVPGX2
Influenza A by PCR: NEGATIVE
Influenza B by PCR: NEGATIVE
Resp Syncytial Virus by PCR: NEGATIVE
SARS Coronavirus 2 by RT PCR: NEGATIVE

## 2023-12-25 LAB — PHOSPHORUS: Phosphorus: 3.9 mg/dL (ref 2.5–4.6)

## 2023-12-25 LAB — TROPONIN I (HIGH SENSITIVITY): Troponin I (High Sensitivity): 13 ng/L (ref <18)

## 2023-12-25 MED ORDER — HYDRALAZINE HCL 20 MG/ML IJ SOLN: 5.0000 mg | Freq: Four times a day (QID) | INTRAMUSCULAR | Status: DC | PRN

## 2023-12-25 MED ORDER — ONDANSETRON HCL 4 MG/2ML IJ SOLN: 4.0000 mg | Freq: Four times a day (QID) | INTRAMUSCULAR | Status: DC | PRN

## 2023-12-25 MED ORDER — SODIUM CHLORIDE 0.9 % IV SOLN: 250.0000 mL | INTRAVENOUS | Status: AC | PRN

## 2023-12-25 MED ORDER — SODIUM CHLORIDE 0.9% FLUSH: 3.0000 mL | INTRAVENOUS | Status: DC | PRN

## 2023-12-25 MED ORDER — VENLAFAXINE HCL ER 75 MG PO CP24
75.0000 mg | ORAL_CAPSULE | Freq: Every day | ORAL | Status: DC
  Administered 2023-12-26 – 2023-12-28 (×3): 75 mg via ORAL
  Filled 2023-12-25 (×4): qty 1

## 2023-12-25 MED ORDER — SENNOSIDES-DOCUSATE SODIUM 8.6-50 MG PO TABS
1.0000 | ORAL_TABLET | Freq: Two times a day (BID) | ORAL | Status: DC
  Administered 2023-12-25 – 2023-12-28 (×3): 1 via ORAL
  Filled 2023-12-25 (×6): qty 1

## 2023-12-25 MED ORDER — MECLIZINE HCL 12.5 MG PO TABS
12.5000 mg | ORAL_TABLET | Freq: Two times a day (BID) | ORAL | Status: DC
  Administered 2023-12-25 – 2023-12-28 (×6): 12.5 mg via ORAL
  Filled 2023-12-25 (×7): qty 1

## 2023-12-25 MED ORDER — SIMVASTATIN 20 MG PO TABS
20.0000 mg | ORAL_TABLET | Freq: Every day | ORAL | Status: DC
  Administered 2023-12-26 – 2023-12-28 (×3): 20 mg via ORAL
  Filled 2023-12-25 (×3): qty 1

## 2023-12-25 MED ORDER — METOPROLOL TARTRATE 12.5 MG HALF TABLET: 12.5000 mg | ORAL_TABLET | Freq: Two times a day (BID) | ORAL | Status: DC

## 2023-12-25 MED ORDER — ONDANSETRON HCL 4 MG PO TABS: 4.0000 mg | ORAL_TABLET | Freq: Four times a day (QID) | ORAL | Status: DC | PRN

## 2023-12-25 MED ORDER — SODIUM CHLORIDE 0.9 % IV BOLUS
500.0000 mL | Freq: Once | INTRAVENOUS | Status: AC
  Administered 2023-12-25: 500 mL via INTRAVENOUS

## 2023-12-25 MED ORDER — PANTOPRAZOLE SODIUM 40 MG PO TBEC
40.0000 mg | DELAYED_RELEASE_TABLET | Freq: Every day | ORAL | Status: DC
  Administered 2023-12-26 – 2023-12-28 (×3): 40 mg via ORAL
  Filled 2023-12-25 (×3): qty 1

## 2023-12-25 MED ORDER — POLYETHYLENE GLYCOL 3350 17 G PO PACK
17.0000 g | PACK | Freq: Two times a day (BID) | ORAL | Status: DC
  Administered 2023-12-25 – 2023-12-28 (×3): 17 g via ORAL
  Filled 2023-12-25 (×5): qty 1

## 2023-12-25 MED ORDER — DONEPEZIL HCL 10 MG PO TABS
5.0000 mg | ORAL_TABLET | Freq: Every day | ORAL | Status: DC
  Administered 2023-12-25 – 2023-12-27 (×3): 5 mg via ORAL
  Filled 2023-12-25 (×3): qty 1

## 2023-12-25 MED ORDER — MECLIZINE HCL 25 MG PO TABS
12.5000 mg | ORAL_TABLET | Freq: Once | ORAL | Status: AC
  Administered 2023-12-25: 12.5 mg via ORAL
  Filled 2023-12-25: qty 1

## 2023-12-25 MED ORDER — LIDOCAINE 5 % EX PTCH
1.0000 | MEDICATED_PATCH | CUTANEOUS | Status: DC
  Administered 2023-12-25 – 2023-12-27 (×3): 1 via TRANSDERMAL
  Filled 2023-12-25 (×3): qty 1

## 2023-12-25 MED ORDER — SODIUM CHLORIDE 0.9% FLUSH
3.0000 mL | Freq: Two times a day (BID) | INTRAVENOUS | Status: DC
  Administered 2023-12-25 – 2023-12-28 (×6): 3 mL via INTRAVENOUS

## 2023-12-25 MED ORDER — MEMANTINE HCL 10 MG PO TABS
5.0000 mg | ORAL_TABLET | Freq: Two times a day (BID) | ORAL | Status: DC
  Administered 2023-12-25 – 2023-12-28 (×6): 5 mg via ORAL
  Filled 2023-12-25 (×6): qty 1

## 2023-12-25 NOTE — H&P (Addendum)
 History and Physical    [name]:[id number] DOB: 02-13-1936 DOA: 12/25/2023  PCP: Ezell Hollow, MD   Patient coming from: Home   Chief Complaint:  Chief Complaint  Patient presents with   Back Pain   Dizziness   ED TRIAGE note:  Lower back pain for 2 weeks,no injury. Getting worse.no urinary difficulties.      HPI:  [name] is a 88 y.o. female with medical history significant of chronic systolic and diastolic heart failure, hyperlipidemia, hypertension and dementia presented to emergency department complaining about dizziness/lightheadedness for 1 week and lower back pain.  Patient reported that she has been feeling woozy and lightheaded.  Patient reported she felt lightheaded and woozy sometimes when she turns her head very quickly and sometime it happens spontaneously with ambulation.   Reported did not have any bowel movement for more than 1 week even though taking 5 psyllium. Patient denies any syncope and presyncope episode.  Denies any chest pain, shortness of breath and palpitation.  No other complaint at this time.  Of note, patient has been seen in the ED in 11/2023 for dizziness associated mechanical fall.  Unremarkable workup during that time.  At presentation to ED patient found hypertensive blood pressure 166/93 otherwise hemodynamically stable. CBC, CMP, UA, troponin, mag, Phos, respiratory panel unremarkable.  CT lumbar spine showing no acute lumbar spine fracture.  Degenerative disc and facet disease at L3-4 and L4-5. 3. 2 cm left adrenal gland lesion is an indeterminate finding. 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 Recommend follow-up noncontrast abdominal CT scan in 4-6 months to reassess. 4. Aortic atherosclerosis.  CT head no acute intracranial abnormality.Advanced chronic small vessel ischemic disease   Chest x-ray unremarkable.  EKG showing sinus rhythm with multiple premature ventricular complex.  Prolonged PR interval  LA ventricular hypertrophy pattern.  Per chart review previous EKG has similar finding of prolonged PR interval and left ventricular hypertrophy pattern.   [facility] for further workup for lightheadedness and need for MRI.  Hospitalist has been consulted for management of lightheadedness/dizziness.  Significant labs in the ED: Lab Orders         Urine Culture         Resp panel by RT-PCR (RSV, Flu A&B, Covid) Anterior Nasal Swab         CBC with Differential         Comprehensive metabolic panel         Urinalysis, Routine w reflex microscopic -Urine, Clean Catch         Magnesium         Phosphorus         Comprehensive metabolic panel         CBC       Review of Systems:  Review of Systems  Constitutional:  Negative for chills, fever, malaise/fatigue and weight loss.  HENT:  Negative for congestion, ear discharge, ear pain, hearing loss, nosebleeds, sinus pain, sore throat and tinnitus.   Eyes:  Negative for blurred vision, double vision, photophobia, pain and discharge.  Respiratory:  Negative for cough, sputum production, shortness of breath and stridor.   Cardiovascular:  Negative for chest pain, palpitations, orthopnea, claudication and leg swelling.  Gastrointestinal:  Negative for abdominal pain, heartburn, nausea and vomiting.  Genitourinary:  Negative for dysuria, flank pain, frequency and urgency.    Past Medical History:  Diagnosis Date   Aortic stenosis  ECHO 5-20ll mild AS but no evidence of HCM or MR--no further testing suggested    Blood transfusion without reported diagnosis 1971   after hysterectomy   Cancer (HCC) 2019   squamous cell carcinoma- on chest   Depression    GERD (gastroesophageal reflux disease)    History of gastroesophageal reflux (GERD)    Hx of adenomatous colonic polyps    Hx of cardiac cath 2006   neg   Hypertension    Mixed urge and stress incontinence    OAB (overactive  bladder) 07/13/2012   Osteoarthritis    Osteopenia    rx fosamax 08-2013   Personal history of colonic polyps - adenomas 04/19/2014    Past Surgical History:  Procedure Laterality Date   ABDOMINAL HYSTERECTOMY  1971   APPENDECTOMY  1971   (at time of hyst?)   BLADDER SURGERY  2003   CATARACT EXTRACTION, BILATERAL     CHOLECYSTECTOMY     OOPHORECTOMY  1971   ROTATOR CUFF REPAIR Right    TOE SURGERY  1995   R 2nd --- correction of claw toe      reports that she has never smoked. She has never used smokeless tobacco. She reports that she does not currently use alcohol. She reports that she does not use drugs.  No Known Allergies  Family History  Problem Relation Age of Onset   Coronary artery disease Father 64       had CABG   Cancer Father        gallbladder   Stroke Mother 30   Ovarian cancer Sister 29   Colon cancer Neg Hx        NEGATIVE   Breast cancer Neg Hx        NEGATIVE    Prior to Admission medications   Medication Sig Start Date End Date Taking? Authorizing Provider  acetaminophen (TYLENOL) 500 MG tablet Take 500 mg by mouth in the morning and at bedtime. 08/28/19   [provider]  cephALEXin (KEFLEX) 500 MG capsule Take 1 capsule (500 mg total) by mouth 3 (three) times daily. 11/21/23   Merdis Stalling, MD  cholecalciferol (VITAMIN D3) 25 MCG (1000 UNIT) tablet Take 1,000 Units by mouth in the morning and at bedtime.    [provider]  donepezil (ARICEPT) 5 MG tablet Take 1 tablet (5 mg total) by mouth at bedtime. 06/15/23   O'Sullivan, Melissa, NP  memantine (NAMENDA) 5 MG tablet Take 1 tablet (5 mg total) by mouth 2 (two) times daily. 10/27/23   Paz, Jose E, MD  Multiple Vitamins-Minerals (HAIR SKIN & NAILS PO) Take 1 tablet by mouth daily.    [provider]  omeprazole (PRILOSEC) 20 MG capsule Take 1 capsule (20 mg total) by mouth 2 (two) times daily before a meal. 10/27/23   Ezell Hollow, MD  oseltamivir (TAMIFLU) 75 MG capsule Take  1 capsule (75 mg total) by mouth 2 (two) times daily. 09/13/23   Estill Hemming, DO  simvastatin (ZOCOR) 20 MG tablet Take 1 tablet (20 mg total) by mouth daily. 08/09/23   Paz, Jose E, MD  venlafaxine XR (EFFEXOR XR) 75 MG 24 hr capsule Take 1 capsule (75 mg total) by mouth daily with breakfast. 07/23/23   Ezell Hollow, MD     Physical Exam: Vitals:   12/25/23 1306 12/25/23 1625 12/25/23 1715 12/25/23 2115  BP: (!) 166/93 (!) 164/88 (!) 155/80 (!) 154/81  Pulse: 98 82 82 91  Resp: 18 16  18   Temp: 97.7 F (36.5 C) 97.9 F (36.6 C)  99.5 F (37.5 C)  TempSrc:  Oral  Oral  SpO2: 96% 100% 96% 95%    Physical Exam Vitals and nursing note reviewed.  Constitutional:      Appearance: She is not ill-appearing.  Cardiovascular:     Rate and Rhythm: Normal rate and regular rhythm.     Pulses: Normal pulses.     Heart sounds: Murmur heard.  Pulmonary:     Effort: Pulmonary effort is normal.     Breath sounds: Normal breath sounds.  Abdominal:     Palpations: Abdomen is soft.  Musculoskeletal:     Cervical back: Normal range of motion and neck supple.     Right lower leg: No edema.     Left lower leg: No edema.  Skin:    General: Skin is dry.     Capillary Refill: Capillary refill takes less than 2 seconds.  Neurological:     General: No focal deficit present.     Mental Status: She is alert and oriented to person, place, and time.     Cranial Nerves: No cranial nerve deficit.     Sensory: No sensory deficit.     Motor: No weakness.     Coordination: Coordination normal.     Gait: Gait abnormal.     Deep Tendon Reflexes: Reflexes normal.  Psychiatric:        Mood and Affect: Mood normal.        Behavior: Behavior normal.        Thought Content: Thought content normal.        Judgment: Judgment normal.      Labs on Admission: I have personally reviewed following labs and imaging studies  CBC: Recent Labs  Lab 12/25/23 1546  WBC 4.8  NEUTROABS 2.7  HGB 12.0   HCT 36.6  MCV 92.2  PLT 215   Basic Metabolic Panel: Recent Labs  Lab 12/25/23 1546 12/25/23 1901  NA 142  --   K 3.8  --   CL 106  --   CO2 27  --   GLUCOSE 98  --   BUN 21  --   CREATININE 0.90  --   CALCIUM 10.1  --   MG  --  2.2  PHOS  --  3.9   GFR: CrCl cannot be calculated (Unknown ideal weight.). Liver Function Tests: Recent Labs  Lab 12/25/23 1546  AST 17  ALT 14  ALKPHOS 45  BILITOT 0.4  PROT 7.2  ALBUMIN 4.5   No results for input(s): "LIPASE", "AMYLASE" in the last 168 hours. No results for input(s): "AMMONIA" in the last 168 hours. Coagulation Profile: No results for input(s): "INR", "PROTIME" in the last 168 hours. Cardiac Enzymes: Recent Labs  Lab 12/25/23 1546  TROPONINIHS 13   BNP (last 3 results) No results for input(s): "BNP" in the last 8760 hours. HbA1C: No results for input(s): "HGBA1C" in the last 72 hours. CBG: No results for input(s): "GLUCAP" in the last 168 hours. Lipid Profile: No results for input(s): "CHOL", "HDL", "LDLCALC", "TRIG", "CHOLHDL", "LDLDIRECT" in the last 72 hours. Thyroid Function Tests: No results for input(s): "TSH", "T4TOTAL", "FREET4", "T3FREE", "THYROIDAB" in the last 72 hours. Anemia Panel: No results for input(s): "VITAMINB12", "FOLATE", "FERRITIN", "TIBC", "IRON", "RETICCTPCT" in the last 72 hours. Urine analysis:    Component Value Date/Time   COLORURINE COLORLESS (A) 12/25/2023 1546   APPEARANCEUR CLEAR 12/25/2023 1546  LABSPEC 1.005 12/25/2023 1546   PHURINE 7.0 12/25/2023 1546   GLUCOSEU NEGATIVE 12/25/2023 1546   GLUCOSEU NEGATIVE 07/14/2022 1600   HGBUR NEGATIVE 12/25/2023 1546   BILIRUBINUR NEGATIVE 12/25/2023 1546   BILIRUBINUR Negative 02/05/2023 1402   KETONESUR NEGATIVE 12/25/2023 1546   PROTEINUR NEGATIVE 12/25/2023 1546   UROBILINOGEN 0.2 02/05/2023 1402   UROBILINOGEN 0.2 07/14/2022 1600   NITRITE NEGATIVE 12/25/2023 1546   LEUKOCYTESUR NEGATIVE 12/25/2023 1546     Radiological Exams on Admission: I have personally reviewed images DG Chest Port 1 View Result Date: 12/25/2023 CLINICAL DATA:  Fatigue EXAM: PORTABLE CHEST 1 VIEW COMPARISON:  11/21/2023 FINDINGS: heart upper limits normal in size. Mediastinal contours within normal limits. Aortic atherosclerosis. Coarsened interstitial prominence in the mid and lower lungs, likely chronic lung disease. No acute confluent opacities or effusions. No acute bony abnormality. IMPRESSION: Chronic changes.  No active disease. Electronically Signed   By: Janeece Mechanic M.D.   On: 12/25/2023 20:01   CT Lumbar Spine Wo Contrast Result Date: 12/25/2023 CLINICAL DATA:  Back trauma with pain. EXAM: CT LUMBAR SPINE WITHOUT CONTRAST TECHNIQUE: Multidetector CT imaging of the lumbar spine was performed without intravenous contrast administration. Multiplanar CT image reconstructions were also generated. RADIATION DOSE REDUCTION: This exam was performed according to the departmental dose-optimization program which includes automated exposure control, adjustment of the mA and/or kV according to patient size and/or use of iterative reconstruction technique. COMPARISON:  None Available. FINDINGS: Segmentation: There are five lumbar type vertebral bodies. The last full intervertebral disc space is labeled L5-S1. Alignment: Normal overall alignment. There is mild degenerative anterolisthesis of L5. Vertebrae: No acute lumbar spine fracture. No bone lesions. No pars defects. Mild diffuse osteoporosis. Paraspinal and other soft tissues: 2 cm left adrenal gland lesion noted. This measures 25 Hounsfield units and is an indeterminate finding. 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 Recommend follow-up noncontrast chest CT in 4-6 months to reassess. No renal lesions. Aortic calcifications but no aneurysm. Disc levels: No significant findings at T12-L1, L1-2 or L2-3. L3-4: Bulging annulus, osteophytic ridging and facet disease  contributing to mild spinal and bilateral lateral recess stenosis no foraminal stenosis. L4-5: Diffuse annular bulge and advanced facet disease but no significant spinal or foraminal stenosis. Mild bilateral lateral recess stenosis. L5-S1: Disc disease and facet disease but no disc protrusions, spinal or foraminal stenosis. IMPRESSION: 1. No acute lumbar spine fracture. 2. Degenerative disc and facet disease at L3-4 and L4-5. 3. 2 cm left adrenal gland lesion is an indeterminate finding. 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 Recommend follow-up noncontrast abdominal CT scan in 4-6 months to reassess. 4. Aortic atherosclerosis. Electronically Signed   By: Marrian Siva M.D.   On: 12/25/2023 17:22   CT Head Wo Contrast Result Date: 12/25/2023 CLINICAL DATA:  Headache, new onset (Age >= 51y). EXAM: CT HEAD WITHOUT CONTRAST TECHNIQUE: Contiguous axial images were obtained from the base of the skull through the vertex without intravenous contrast. RADIATION DOSE REDUCTION: This exam was performed according to the departmental dose-optimization program which includes automated exposure control, adjustment of the mA and/or kV according to patient size and/or use of iterative reconstruction technique. COMPARISON:  Head CT 11/21/2023 FINDINGS: Brain: There is no evidence of an acute infarct, intracranial hemorrhage, mass, midline shift, or extra-axial fluid collection. Mild cerebral atrophy is unchanged and within normal limits for age. Confluent cerebral white matter hypodensities are unchanged and nonspecific but compatible with advanced chronic small vessel ischemic disease.  Vascular: Calcified atherosclerosis at the skull base. No hyperdense vessel. Skull: No acute fracture or suspicious lesion. Sinuses/Orbits: Scattered mild mucosal thickening and small volume fluid in the included paranasal sinuses. Included mastoid air cells are clear. Bilateral cataract extraction. Other: None. IMPRESSION: 1.  No evidence of acute intracranial abnormality. 2. Advanced chronic small vessel ischemic disease. Electronically Signed   By: Aundra Lee M.D.   On: 12/25/2023 17:17     EKG: My personal interpretation of EKG shows: Normal sinus rhythm heart rate 75 and premature ventricular complex.  Left ventricular hypertrophy pattern.    Assessment/Plan: Principal Problem:   Dizziness Active Problems:   Hyperlipidemia   Essential hypertension   Dementia without behavioral disturbance (HCC)   CHF (congestive heart failure) (HCC)   Lower back pain   Constipation    Assessment and Plan: Dizziness/lightheadedness History of mechanical fall - Patient presented emergency department complaining of 1 week of lightheadedness/wooziness.  Patient had a fall last month March 2025.  Denies any fall afterward.  Denies any syncope, presyncope, seizure, chest pain, palpitation and headache.  She is complaining about lower back pain for 2 weeks.  Patient reported she feels like headache and dizziness turning around her head very suddenly.  However sometimes she does get lightheaded without any positional change.  Nursing team is stating that while he have transferred the patient from stretcher to bed patient has unsteady gait.   -Physical exam revealed aortic murmur. - In the ED at presentation patient found hypertensive otherwise hemodynamically stable. - CBC, CMP, Phos, mag, troponin, respiratory panel unremarkable - Chest x-ray unremarkable - CT head unremarkable - CT lumbar spine no evidence of fracture except L3-L5 degenerative disc change.  2 cm left adrenal gland lesion likely benign lipid poor adenoma. -EKG showing sinus rhythm with multiple premature ventricular complex which is a new finding.  Heart rate is 75.  There is no ST-T wave abnormality. -Differential of lightheadedness and dizziness is CVA, BPPV, vestibular migraine, intracranial tumor, drug side effect and orthostatic hypotension.   -In the  setting of aortic murmur it is possible that patient might have underlying aortic valve regurgitation/stenosis which is causing decreased blood supply with ambulation and contributing to dizziness/lightheadedness. - Obtaining MRI of brain without contrast. - Obtaining echocardiogram. -Starting meclizine 12.5 mg twice daily. -Consulting PT vestibular therapy. - Obtaining orthostatic vital. - Continue cardiac monitoring. Addendum - MRI w/wo contrast no acute intracranial abnormality.  Chronic microvascular ischemic change.   Unsteady gait - Nursing team stating that patient has unsteady gait while transferring from stretcher to bed bed.   -Consulting PT and OT for evaluation of balance and appropriate DME  Combined systolic and diastolic heart failure reduced EF 50% Essential hypertension Premature ventricular complex - On presentation to ED patient found hypertensive.   -Normal troponin - Chest x-ray unremarkable. - At home currently not any blood pressure regimen.  EKG showing normal sinus rhythm heart rate 75 multiple premature ventricular complex. -Holding any initiation of oral blood pressure regimen.  Checking orthostatic vital. - Continue to check blood pressure and starting hydralazine as needed.. -On discharge patient will need Holter monitor to follow-up with heart rate.   Lower back pain -CT lumbar spine unremarkable finding of any acute abnormality or fracture.  L3-L5 degenerative disc change. -Continue lidocaine patch.  Tylenol as needed.  Dementia General anxiety disorder -Continue memantine, Effexor and donepezil.  Hyperlipidemia -Continue Lipitor  Constipation The patient reported did not have any bowel movement for more than 1 week  even though she has been taking 5 psyllium. - Continue MiraLAX and starting Senokot twice daily.   DVT prophylaxis:  SCDs.  Deferring pharmacological prophylaxis as high risk of fall and bleeding. Code Status:  Full Code Diet:  Heart healthy diet Family Communication:   Family was present at bedside, at the time of interview.  Opportunity was given to ask question and all questions were answered satisfactorily.  Disposition Plan: Pending MRI and echocardiogram.  Based on results can decide further treatment care. Consults: PT vestibular therapy Admission status:   Inpatient, Telemetry bed  Severity of Illness: The appropriate patient status for this patient is INPATIENT. Inpatient status is judged to be reasonable and necessary in order to provide the required intensity of service to ensure the patient's safety. The patient's presenting symptoms, physical exam findings, and initial radiographic and laboratory data in the context of their chronic comorbidities is felt to place them at high risk for further clinical deterioration. Furthermore, it is not anticipated that the patient will be medically stable for discharge from the hospital within 2 midnights of admission.   * I certify that at the point of admission it is my clinical judgment that the patient will require inpatient hospital care spanning beyond 2 midnights from the point of admission due to high intensity of service, high risk for further deterioration and high frequency of surveillance required.Aaron Aas    Priscille Shadduck, MD [facility]  How to contact the [facility] Attending or Consulting provider 7A - 7P or covering provider during after hours 7P -7A, for this patient.  Check the care team in [facility] and look for a) attending/consulting [facility] provider listed and b) the [facility] team listed Log into [email or web address] and use [place]'s universal password to access. If you do not have the password, please contact the hospital operator. Locate the [facility] provider you are looking for under [facility] and page to a number that you can be directly reached. If you still have difficulty reaching the provider, please page the [facility] (Director on Call) for the Hospitalists  listed on amion for assistance.  12/25/2023, 9:32 PM

## 2023-12-25 NOTE — Plan of Care (Signed)
 88 yo F with hx of HTN, hLD, dementia Has been Dizzy for the past week describes as whoozy lightheaded Unable to provide hx  She is at her baseline No CP no SOB  She feels like she has to hold on to to walk  UA and CBC/BMET wnl trop wnl No nystagmus daughter is worried she lives in independent living asked for CXR  Accepted to MRI tele bed Draysen Weygandt 6:59 PM

## 2023-12-25 NOTE — ED Triage Notes (Signed)
 Lower back pain for 2 weeks,no injury. Getting worse.no urinary difficulties.

## 2023-12-25 NOTE — ED Provider Notes (Signed)
 [place] EMERGENCY DEPARTMENT AT [facility] Provider Note   CSN: [id number] Arrival date & time: 12/25/23  1258     History Chief Complaint  Patient presents with   Back Pain   Dizziness    [name] is a 88 y.o. female.   Back Pain Dizziness      Home Medications Prior to Admission medications   Medication Sig Start Date End Date Taking? Authorizing Provider  acetaminophen (TYLENOL) 500 MG tablet Take 500 mg by mouth in the morning and at bedtime. 08/28/19   [provider]  cephALEXin (KEFLEX) 500 MG capsule Take 1 capsule (500 mg total) by mouth 3 (three) times daily. 11/21/23   Merdis Stalling, MD  cholecalciferol (VITAMIN D3) 25 MCG (1000 UNIT) tablet Take 1,000 Units by mouth in the morning and at bedtime.    [provider]  donepezil (ARICEPT) 5 MG tablet Take 1 tablet (5 mg total) by mouth at bedtime. 06/15/23   O'Sullivan, Melissa, NP  memantine (NAMENDA) 5 MG tablet Take 1 tablet (5 mg total) by mouth 2 (two) times daily. 10/27/23   Paz, Jose E, MD  Multiple Vitamins-Minerals (HAIR SKIN & NAILS PO) Take 1 tablet by mouth daily.    [provider]  omeprazole (PRILOSEC) 20 MG capsule Take 1 capsule (20 mg total) by mouth 2 (two) times daily before a meal. 10/27/23   Ezell Hollow, MD  oseltamivir (TAMIFLU) 75 MG capsule Take 1 capsule (75 mg total) by mouth 2 (two) times daily. 09/13/23   Estill Hemming, DO  simvastatin (ZOCOR) 20 MG tablet Take 1 tablet (20 mg total) by mouth daily. 08/09/23   Paz, Jose E, MD  venlafaxine XR (EFFEXOR XR) 75 MG 24 hr capsule Take 1 capsule (75 mg total) by mouth daily with breakfast. 07/23/23   Paz, Jose E, MD      Allergies    Patient has no known allergies.    Review of Systems   Review of Systems  Musculoskeletal:  Positive for back pain.  Neurological:  Positive for dizziness.    Physical Exam Updated Vital Signs BP (!) 155/80   Pulse 82   Temp 97.9 F (36.6 C) (Oral)   Resp  16   SpO2 96%  Physical Exam  ED Results / Procedures / Treatments   Labs (all labs ordered are listed, but only abnormal results are displayed) Labs Reviewed  URINALYSIS, ROUTINE W REFLEX MICROSCOPIC - Abnormal; Notable for the following components:      Result Value   Color, Urine COLORLESS (*)    All other components within normal limits  URINE CULTURE  RESP PANEL BY RT-PCR (RSV, FLU A&B, COVID)  RVPGX2  CBC WITH DIFFERENTIAL/PLATELET  COMPREHENSIVE METABOLIC PANEL WITH GFR  MAGNESIUM  PHOSPHORUS  TROPONIN I (HIGH SENSITIVITY)    EKG EKG Interpretation Date/Time:  Saturday December 25 2023 15:38:52 EDT Ventricular Rate:  75 PR Interval:  224 QRS Duration:  171 QT Interval:  435 QTC Calculation: 486 R Axis:   -25  Text Interpretation: Sinus rhythm Multiple ventricular premature complexes Prolonged PR interval Left bundle branch block Confirmed by Abner Hoffman [id number]) on 12/25/2023 3:58:34 PM  Radiology CT Lumbar Spine Wo Contrast Result Date: 12/25/2023 CLINICAL DATA:  Back trauma with pain. EXAM: CT LUMBAR SPINE WITHOUT CONTRAST TECHNIQUE: Multidetector CT imaging of the lumbar spine was performed without intravenous contrast administration. Multiplanar CT image reconstructions were also generated. RADIATION DOSE REDUCTION: This exam was performed according  to the departmental dose-optimization program which includes automated exposure control, adjustment of the mA and/or kV according to patient size and/or use of iterative reconstruction technique. COMPARISON:  None Available. FINDINGS: Segmentation: There are five lumbar type vertebral bodies. The last full intervertebral disc space is labeled L5-S1. Alignment: Normal overall alignment. There is mild degenerative anterolisthesis of L5. Vertebrae: No acute lumbar spine fracture. No bone lesions. No pars defects. Mild diffuse osteoporosis. Paraspinal and other soft tissues: 2 cm left adrenal gland lesion noted. This measures 25  Hounsfield units and is an indeterminate finding. 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 Recommend follow-up noncontrast chest CT in 4-6 months to reassess. No renal lesions. Aortic calcifications but no aneurysm. Disc levels: No significant findings at T12-L1, L1-2 or L2-3. L3-4: Bulging annulus, osteophytic ridging and facet disease contributing to mild spinal and bilateral lateral recess stenosis no foraminal stenosis. L4-5: Diffuse annular bulge and advanced facet disease but no significant spinal or foraminal stenosis. Mild bilateral lateral recess stenosis. L5-S1: Disc disease and facet disease but no disc protrusions, spinal or foraminal stenosis. IMPRESSION: 1. No acute lumbar spine fracture. 2. Degenerative disc and facet disease at L3-4 and L4-5. 3. 2 cm left adrenal gland lesion is an indeterminate finding. 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 Recommend follow-up noncontrast abdominal CT scan in 4-6 months to reassess. 4. Aortic atherosclerosis. Electronically Signed   By: Marrian Siva M.D.   On: 12/25/2023 17:22   CT Head Wo Contrast Result Date: 12/25/2023 CLINICAL DATA:  Headache, new onset (Age >= 51y). EXAM: CT HEAD WITHOUT CONTRAST TECHNIQUE: Contiguous axial images were obtained from the base of the skull through the vertex without intravenous contrast. RADIATION DOSE REDUCTION: This exam was performed according to the departmental dose-optimization program which includes automated exposure control, adjustment of the mA and/or kV according to patient size and/or use of iterative reconstruction technique. COMPARISON:  Head CT 11/21/2023 FINDINGS: Brain: There is no evidence of an acute infarct, intracranial hemorrhage, mass, midline shift, or extra-axial fluid collection. Mild cerebral atrophy is unchanged and within normal limits for age. Confluent cerebral white matter hypodensities are unchanged and nonspecific but compatible with advanced chronic small  vessel ischemic disease. Vascular: Calcified atherosclerosis at the skull base. No hyperdense vessel. Skull: No acute fracture or suspicious lesion. Sinuses/Orbits: Scattered mild mucosal thickening and small volume fluid in the included paranasal sinuses. Included mastoid air cells are clear. Bilateral cataract extraction. Other: None. IMPRESSION: 1. No evidence of acute intracranial abnormality. 2. Advanced chronic small vessel ischemic disease. Electronically Signed   By: Aundra Lee M.D.   On: 12/25/2023 17:17    Procedures Procedures   Medications Ordered in ED Medications  sodium chloride 0.9 % bolus 500 mL (0 mLs Intravenous Stopped 12/25/23 1732)  meclizine (ANTIVERT) tablet 12.5 mg (12.5 mg Oral Given 12/25/23 1546)    ED Course/ Medical Decision Making/ A&P   Medical Decision Making Amount and/or Complexity of Data Reviewed Labs: ordered. Radiology: ordered.  Risk Decision regarding hospitalization.   88 y.o. female presents to the ER for evaluation of "dizziness". Differential diagnosis includes but is not limited to BPPV, vestibular migraine, head trauma, AVM, intracranial tumor, multiple sclerosis, drug-related, CVA, orthostatic hypotension, sepsis, hypoglycemia, electrolyte disturbance, anemia, anxiety. Vital signs show mildly elevated BP, otherwise unremarkable . Physical exam as noted above.   I independently reviewed and interpreted the patient's labs.  Urinalysis colorless urine otherwise unremarkable.  Troponin at 13.  CMP and CBC without  abnormality.  EKG reviewed and interpreted by my attending and read as Sinus rhythm Multiple ventricular premature complexes Prolonged PR interval Left bundle branch block. Per radiologist's interpretation.    CT head 1. No evidence of acute intracranial abnormality. 2. Advanced chronic small vessel ischemic disease.   CT lumbar  1. No acute lumbar spine fracture. 2. Degenerative disc and facet disease at L3-4 and L4-5. 3. 2 cm left  adrenal gland lesion is an indeterminate finding. 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 Recommend follow-up noncontrast abdominal CT scan in 4-6 months to reassess. 4. Aortic atherosclerosis. CXR, Mg, Phos, and Resp Panel added per hospitalist. TSH deferred given that this is a send out lab and she will be transferred to [facility]. Dr. Hendrick Locke is aware and ok with this.   ***We discussed the results of the labs/imaging. The plan is ***. We discussed strict return precautions and red flag symptoms. The patient verbalized their understanding and agrees to the plan. The patient is stable and being discharged home in good condition.  ***Portions of this report may have been transcribed using voice recognition software. Every effort was made to ensure accuracy; however, inadvertent computerized transcription errors may be present.   Final Clinical Impression(s) / ED Diagnoses Final diagnoses:  None    Rx / DC Orders ED Discharge Orders     None

## 2023-12-26 ENCOUNTER — Observation Stay (HOSPITAL_COMMUNITY)

## 2023-12-26 DIAGNOSIS — R2681 Unsteadiness on feet: Secondary | ICD-10-CM | POA: Diagnosis not present

## 2023-12-26 DIAGNOSIS — Z79899 Other long term (current) drug therapy: Secondary | ICD-10-CM | POA: Diagnosis not present

## 2023-12-26 DIAGNOSIS — I6782 Cerebral ischemia: Secondary | ICD-10-CM | POA: Diagnosis not present

## 2023-12-26 DIAGNOSIS — I5022 Chronic systolic (congestive) heart failure: Secondary | ICD-10-CM

## 2023-12-26 DIAGNOSIS — E785 Hyperlipidemia, unspecified: Secondary | ICD-10-CM | POA: Diagnosis not present

## 2023-12-26 DIAGNOSIS — I110 Hypertensive heart disease with heart failure: Secondary | ICD-10-CM | POA: Diagnosis not present

## 2023-12-26 DIAGNOSIS — Z85828 Personal history of other malignant neoplasm of skin: Secondary | ICD-10-CM | POA: Diagnosis not present

## 2023-12-26 DIAGNOSIS — I10 Essential (primary) hypertension: Secondary | ICD-10-CM | POA: Diagnosis not present

## 2023-12-26 DIAGNOSIS — I5043 Acute on chronic combined systolic (congestive) and diastolic (congestive) heart failure: Secondary | ICD-10-CM | POA: Diagnosis not present

## 2023-12-26 DIAGNOSIS — I493 Ventricular premature depolarization: Secondary | ICD-10-CM | POA: Diagnosis not present

## 2023-12-26 DIAGNOSIS — K5900 Constipation, unspecified: Secondary | ICD-10-CM

## 2023-12-26 DIAGNOSIS — R9431 Abnormal electrocardiogram [ECG] [EKG]: Secondary | ICD-10-CM | POA: Diagnosis not present

## 2023-12-26 DIAGNOSIS — I951 Orthostatic hypotension: Secondary | ICD-10-CM | POA: Diagnosis not present

## 2023-12-26 DIAGNOSIS — R42 Dizziness and giddiness: Secondary | ICD-10-CM | POA: Diagnosis not present

## 2023-12-26 DIAGNOSIS — F0390 Unspecified dementia without behavioral disturbance: Secondary | ICD-10-CM | POA: Diagnosis not present

## 2023-12-26 DIAGNOSIS — M545 Low back pain, unspecified: Secondary | ICD-10-CM | POA: Diagnosis not present

## 2023-12-26 LAB — COMPREHENSIVE METABOLIC PANEL WITH GFR
ALT: 22 U/L (ref 0–44)
AST: 24 U/L (ref 15–41)
Albumin: 4.3 g/dL (ref 3.5–5.0)
Alkaline Phosphatase: 41 U/L (ref 38–126)
Anion gap: 11 (ref 5–15)
BUN: 18 mg/dL (ref 8–23)
CO2: 23 mmol/L (ref 22–32)
Calcium: 9.9 mg/dL (ref 8.9–10.3)
Chloride: 104 mmol/L (ref 98–111)
Creatinine, Ser: 1 mg/dL (ref 0.44–1.00)
GFR, Estimated: 55 mL/min — ABNORMAL LOW (ref >60)
Glucose, Bld: 147 mg/dL — ABNORMAL HIGH (ref 70–99)
Potassium: 3.4 mmol/L — ABNORMAL LOW (ref 3.5–5.1)
Sodium: 138 mmol/L (ref 135–145)
Total Bilirubin: 0.7 mg/dL (ref 0.0–1.2)
Total Protein: 7 g/dL (ref 6.5–8.1)

## 2023-12-26 LAB — CBC
HCT: 37.8 % (ref 36.0–46.0)
Hemoglobin: 12.7 g/dL (ref 12.0–15.0)
MCH: 30.7 pg (ref 26.0–34.0)
MCHC: 33.6 g/dL (ref 30.0–36.0)
MCV: 91.3 fL (ref 80.0–100.0)
Platelets: 210 10*3/uL (ref 150–400)
RBC: 4.14 MIL/uL (ref 3.87–5.11)
RDW: 12.4 % (ref 11.5–15.5)
WBC: 4.3 10*3/uL (ref 4.0–10.5)
nRBC: 0 % (ref 0.0–0.2)

## 2023-12-26 LAB — ECHOCARDIOGRAM COMPLETE
AR max vel: 1.29 cm2
AV Area VTI: 1.27 cm2
AV Area mean vel: 1.3 cm2
AV Mean grad: 27 mmHg
AV Peak grad: 44.8 mmHg
Ao pk vel: 3.35 m/s
Area-P 1/2: 6.22 cm2
Height: 65 in
S' Lateral: 3.2 cm
Weight: 2931.24 [oz_av]

## 2023-12-26 MED ORDER — GADOBUTROL 1 MMOL/ML IV SOLN
8.0000 mL | Freq: Once | INTRAVENOUS | Status: AC | PRN
  Administered 2023-12-26: 8 mL via INTRAVENOUS

## 2023-12-26 MED ORDER — LOSARTAN POTASSIUM 50 MG PO TABS
25.0000 mg | ORAL_TABLET | Freq: Every day | ORAL | Status: DC
  Administered 2023-12-26 – 2023-12-28 (×3): 25 mg via ORAL
  Filled 2023-12-26 (×3): qty 1

## 2023-12-26 NOTE — Care Management Obs Status (Signed)
 MEDICARE OBSERVATION STATUS NOTIFICATION   Patient Details  Name: [name] MRN: [id number] Date of Birth: Jan 11, 1936   Medicare Observation Status Notification Given:  Yes    Cindia Crease, RN 12/26/2023, 3:42 PM

## 2023-12-26 NOTE — Progress Notes (Signed)
 Progress Note   Patient: [name]:[id number] DOB: 20-Nov-1935 DOA: 12/25/2023     0 DOS: the patient was seen and examined on 12/26/2023   Brief hospital course: Taken from H&P.  [name] is a 88 y.o. female with medical history significant of chronic systolic and diastolic heart failure, hyperlipidemia, hypertension and dementia presented to emergency department complaining about dizziness/lightheadedness for 1 week and lower back pain.  Patient was also constipated for more than a week.  Denies any syncope or presyncope.  Denies any chest pain, shortness of breath or palpitation  Of note, patient has been seen in the ED in 11/2023 for dizziness associated mechanical fall.  Unremarkable workup during that time.   At presentation to ED patient found hypertensive blood pressure 166/93 otherwise hemodynamically stable. CBC, CMP, UA, troponin, mag, Phos, respiratory panel unremarkable.   CT lumbar spine showing no acute lumbar spine fracture.  Degenerative disc and facet disease at L3-4 and L4-5. 3. 2 cm left adrenal gland lesion is an indeterminate finding. 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 Recommend follow-up noncontrast abdominal CT scan in 4-6 months to reassess. 4. Aortic atherosclerosis.   CT head no acute intracranial abnormality.Advanced chronic small vessel ischemic disease    Chest x-ray unremarkable.   EKG showing sinus rhythm with multiple premature ventricular complex.  Prolonged PR interval LA ventricular hypertrophy pattern.  Per chart review previous EKG has similar finding of prolonged PR interval and left ventricular hypertrophy pattern.  4/13: Vitals with mildly elevated blood pressure, UA unremarkable, troponin negative.  Respiratory panel negative.  MRI brain with no acute intracranial abnormality, patchy T2 flair hyperintensity involving supratentorial cerebral white matter and pons, most characteristic of chronic microvascular  ischemic disease.  Patient was not taking any antihypertensives at home, starting on low-dose losartan-PCP can titrate.  Nursing concern of ST elevation on telemetry, EKG was obtained which was without any acute changes.  Patient has chronic LBBB and similar changes on multiple prior EKGs.  Troponin negative.  Patient remained asymptomatic.  Mildly positive orthostatic vitals from sitting to standing with quick recovery. PT is recommending SNF   Assessment and Plan: * Dizziness History of mechanical fall. Mildly positive orthostatic vitals. PT with no concern of vertigo.  CT head and MRI brain was negative for any acute abnormality. Echocardiogram with EF of 45 to 50%, concern for regional wall motion abnormalities and indeterminate diastolic function.  Moderate aortic stenosis.  Seems chronic, as similar abnormalities noted on prior echo done in 2020. Likely due to poor hydration.  Troponin remain negative - Encourage p.o. hydration - PT is recommending SNF   Chronic HFrEF (heart failure with reduced ejection fraction) (HCC) Echocardiogram with EF of 50%, do have some regional wall motion abnormalities but all of those abnormalities seems chronic.  No chest pain, no acute EKG changes and troponin remain negative.  Clinically appears euvolemic -Continue to monitor  Essential hypertension Blood pressure remained elevated. Patient was not on any antihypertensives at home. - Start adding on low-dose losartan  Constipation Resolved with bowel regimen  Lower back pain CT lumbar spine was without any acute abnormalities.  L3-L5 degenerative disc changes. -Continue with lidocaine patch -Tylenol as needed  Dementia without behavioral disturbance (HCC) - Continue memantine, Effexor and donepezil  Hyperlipidemia - Continue Lipitor   Subjective: Patient was sitting comfortably in chair when seen today.  Denies any chest pain or dizziness.  Daughter at bedside.  Patient does not drink  much water,  drinks a lot of coffee during the day.  Physical Exam: Vitals:   12/26/23 0905 12/26/23 0910 12/26/23 1336 12/26/23 1654  BP: (!) 145/89 (!) 157/87 126/76 134/72  Pulse:   93 83  Resp:   20 16  Temp:   97.7 F (36.5 C) 98.4 F (36.9 C)  TempSrc:   Oral Oral  SpO2:   94% 93%  Weight:      Height:       General.  Frail elderly lady, in no acute distress. Pulmonary.  Lungs clear bilaterally, normal respiratory effort. CV.  Regular rate and rhythm, no JVD, rub or murmur. Abdomen.  Soft, nontender, nondistended, BS positive. CNS.  Alert and oriented .  No focal neurologic deficit. Extremities.  No edema, no cyanosis, pulses intact and symmetrical.  Data Reviewed: Prior data reviewed  Family Communication: Discussed with daughter and SIL at bedside  Disposition: Status is: Observation The patient remains OBS appropriate and will d/c before 2 midnights.  Planned Discharge Destination: Skilled nursing facility  Time spent: 45 minutes  This record has been created using Conservation officer, historic buildings. Errors have been sought and corrected,but may not always be located. Such creation errors do not reflect on the standard of care.   Author: Luna Salinas, MD 12/26/2023 5:18 PM  For on call review [email or web address].

## 2023-12-26 NOTE — NC FL2 (Signed)
 [place]  MEDICAID FL2 LEVEL OF CARE FORM     IDENTIFICATION  Patient Name: [name] Birthdate: 06-21-1936 Sex: female Admission Date (Current Location): 12/25/2023  [facility] and [place] Number:  Producer, television/film/video and Address:  [facility] [place], [address] [place], [place] [address]      Provider Number:    Attending Physician Name and Address:  Luna Salinas, MD  Relative Name and Phone Number:  Lorelee Roger (daughter) [phone]    Current Level of Care: Hospital Recommended Level of Care: Skilled Nursing Facility Prior Approval Number:    Date Approved/Denied:   PASRR Number: PASRR under review  Discharge Plan: SNF    Current Diagnoses: Patient Active Problem List   Diagnosis Date Noted   Dizziness 12/25/2023   CHF (congestive heart failure) (HCC) 12/25/2023   Lower back pain 12/25/2023   Constipation 12/25/2023   Influenza A 09/13/2023   Pain in both knees 06/15/2023   Vitamin D deficiency 06/15/2023   Hyperglycemia, unspecified 06/15/2023   Dementia without behavioral disturbance (HCC) 02/23/2021   Closed fracture of left distal radius 07/31/2019   Closed nondisplaced fracture of lateral malleolus of right fibula 01/17/2019   PCP NOTES >>>>>>>>>>>>>>>> 09/03/2015   History of colonic polyps 04/19/2014   OAB (overactive bladder) 07/13/2012   Annual physical exam 12/17/2010   Aortic stenosis 01/01/2010   Hyperlipidemia 11/21/2008   GERD 11/28/2007   Depression 06/24/2007   Essential hypertension 06/24/2007   OSTEOARTHRITIS 06/24/2007   Osteoporosis 06/24/2007    Orientation RESPIRATION BLADDER Height & Weight     Self  Normal Incontinent Weight: 183 lb 3.2 oz (83.1 kg) Height:  5\' 5"  (165.1 cm)  BEHAVIORAL SYMPTOMS/MOOD NEUROLOGICAL BOWEL NUTRITION STATUS      Continent Diet (Please see discharge summary)  AMBULATORY STATUS COMMUNICATION OF NEEDS Skin   Limited Assist Verbally Other (Comment) (Wound/Incision LDAs)                        Personal Care Assistance Level of Assistance  Bathing, Feeding, Dressing Bathing Assistance: Limited assistance Feeding assistance: Independent Dressing Assistance: Limited assistance     Functional Limitations Info  Sight, Hearing, Speech Sight Info: Adequate Hearing Info: Adequate Speech Info: Adequate    SPECIAL CARE FACTORS FREQUENCY  PT (By licensed PT), OT (By licensed OT)     PT Frequency: 5x min weekly OT Frequency: 5x min weekly            Contractures Contractures Info: Not present    Additional Factors Info  Code Status, Allergies, Psychotropic Code Status Info: FULL Allergies Info: NKA Psychotropic Info: donepezil (ARICEPT) tablet 5 mg daily at bedtime,memantine (NAMENDA) tablet 5 mg 2 times daily,venlafaxine XR (EFFEXOR-XR) 24 hr capsule 75 mg daily with breakfast         Current Medications (12/26/2023):  This is the current hospital active medication list Current Facility-Administered Medications  Medication Dose Route Frequency Provider Last Rate Last Admin   0.9 %  sodium chloride infusion  250 mL Intravenous PRN Sundil, Subrina, MD       donepezil (ARICEPT) tablet 5 mg  5 mg Oral QHS Sundil, Subrina, MD   5 mg at 12/25/23 2214   hydrALAZINE (APRESOLINE) injection 5 mg  5 mg Intravenous Q6H PRN Sundil, Subrina, MD       lidocaine (LIDODERM) 5 % 1 patch  1 patch Transdermal Q24H Sundil, Subrina, MD   1 patch at 12/25/23 2216   losartan (  COZAAR) tablet 25 mg  25 mg Oral Daily Amin, Sumayya, MD   25 mg at 12/26/23 0933   meclizine (ANTIVERT) tablet 12.5 mg  12.5 mg Oral BID Sundil, Subrina, MD   12.5 mg at 12/26/23 0932   memantine (NAMENDA) tablet 5 mg  5 mg Oral BID Sundil, Subrina, MD   5 mg at 12/26/23 0933   ondansetron (ZOFRAN) tablet 4 mg  4 mg Oral Q6H PRN Sundil, Subrina, MD       Or   ondansetron (ZOFRAN) injection 4 mg  4 mg Intravenous Q6H PRN Sundil, Subrina, MD       pantoprazole (PROTONIX) EC tablet 40 mg  40 mg Oral  Daily Sundil, Subrina, MD   40 mg at 12/26/23 0933   polyethylene glycol (MIRALAX / GLYCOLAX) packet 17 g  17 g Oral BID Sundil, Subrina, MD   17 g at 12/25/23 2213   senna-docusate (Senokot-S) tablet 1 tablet  1 tablet Oral BID Sundil, Subrina, MD   1 tablet at 12/25/23 2213   simvastatin (ZOCOR) tablet 20 mg  20 mg Oral Daily Sundil, Subrina, MD   20 mg at 12/26/23 0933   sodium chloride flush (NS) 0.9 % injection 3 mL  3 mL Intravenous Q12H Sundil, Subrina, MD   3 mL at 12/26/23 0934   sodium chloride flush (NS) 0.9 % injection 3 mL  3 mL Intravenous PRN Sundil, Subrina, MD       venlafaxine XR (EFFEXOR-XR) 24 hr capsule 75 mg  75 mg Oral Q breakfast Sundil, Subrina, MD   75 mg at 12/26/23 0700     Discharge Medications: Please see discharge summary for a list of discharge medications.  Relevant Imaging Results:  Relevant Lab Results:   Additional Information [id number]  Carmon Christen, LCSWA

## 2023-12-26 NOTE — Plan of Care (Signed)

## 2023-12-26 NOTE — Evaluation (Signed)
 Occupational Therapy Evaluation Patient Details Name: [name] MRN: [id number] DOB: 1936-02-20 Today's Date: 12/26/2023   History of Present Illness   Pt is an 88 y.o. female presenting 4/12 with lower back pain 2 weeks and dizziness. Recent fall in March. CT lumbar spine with no acute fracture, CXR unremarkable, CTH/MRI brain with no acute abnormality. EKG with sinus rhythm with multiple premature ventricular complex. PMH includes chronic systolic and diastolic heart failure, HLD, HTN and dementia.     Clinical Impressions Pt admitted with problem above with deficits listed below. Per chart, pt was at ILF, but per pt reports she resides in ALF where meals were provided but she managed her own ADL and medications. Pt with no c/o dizziness during session, but per PT orthostatics, pt does experience drop in BP with positional changes. Pt needing grossly CGA for BADL and intermittent min A for transfers up from lower surfaces. Will continue to follow. Noting memory difficulty throughout session, would highly recommend increased assist levels with medication management. Recommending inpatient rehab <3 hours/day; if confirmed pt from ALF who can provide assist may go home with HHOT.      If plan is discharge home, recommend the following:   A little help with walking and/or transfers;A little help with bathing/dressing/bathroom;Assistance with cooking/housework;Direct supervision/assist for medications management;Direct supervision/assist for financial management;Help with stairs or ramp for entrance;Assist for transportation     Functional Status Assessment   Patient has had a recent decline in their functional status and demonstrates the ability to make significant improvements in function in a reasonable and predictable amount of time.     Equipment Recommendations   None recommended by OT     Recommendations for Other Services         Precautions/Restrictions    Precautions Precautions: Fall Recall of Precautions/Restrictions: Impaired     Mobility Bed Mobility               General bed mobility comments: up in chair    Transfers Overall transfer level: Needs assistance Equipment used: Rolling walker (2 wheels) Transfers: Sit to/from Stand Sit to Stand: Min assist           General transfer comment: CGA up from chair 2x but min A up from standard toilet with grab bar      Balance Overall balance assessment: Needs assistance Sitting-balance support: No upper extremity supported, Feet supported Sitting balance-Leahy Scale: Good     Standing balance support: Bilateral upper extremity supported, Reliant on assistive device for balance Standing balance-Leahy Scale: Poor                             ADL either performed or assessed with clinical judgement   ADL Overall ADL's : Needs assistance/impaired Eating/Feeding: Modified independent;Sitting   Grooming: Wash/dry hands;Wash/dry face;Contact guard assist;Standing Grooming Details (indicate cue type and reason): approaching supervision Upper Body Bathing: Set up;Sitting   Lower Body Bathing: Contact guard assist;Sit to/from stand   Upper Body Dressing : Set up;Sitting   Lower Body Dressing: Contact guard assist;Sit to/from stand   Toilet Transfer: Minimal assistance;Ambulation;Rolling walker (2 wheels) Toilet Transfer Details (indicate cue type and reason): min A up from low toilet Toileting- Clothing Manipulation and Hygiene: Contact guard assist;Sit to/from stand;Sitting/lateral lean;Minimal assistance Toileting - Clothing Manipulation Details (indicate cue type and reason): min A for STS     Functional mobility during ADLs: Contact guard assist;Rolling walker (2 wheels)  Vision Baseline Vision/History: 1 Wears glasses Ability to See in Adequate Light: 0 Adequate Patient Visual Report: No change from baseline Additional Comments: pt needs  intermittent cues to locte items on counter top with low contrast from counter     Perception Perception: Not tested       Praxis Praxis: Not tested       Pertinent Vitals/Pain Pain Assessment Pain Assessment: No/denies pain     Extremity/Trunk Assessment Upper Extremity Assessment Upper Extremity Assessment: Overall WFL for tasks assessed   Lower Extremity Assessment Lower Extremity Assessment: Generalized weakness;Defer to PT evaluation   Cervical / Trunk Assessment Cervical / Trunk Assessment: Other exceptions Cervical / Trunk Exceptions: limited cervical ROM, forward head posture   Communication Communication Communication: No apparent difficulties;Impaired Factors Affecting Communication: Hearing impaired   Cognition Arousal: Alert Behavior During Therapy: WFL for tasks assessed/performed Cognition: History of cognitive impairments             OT - Cognition Comments: per chart, pt with dementia. Pt reports ALF provides all meals but reports she manages her own medications; per chart, pt is from ILF. Pt needing min cues for memory throughout session and with poor awareness of bottom soiled on arrival.                 Following commands: Intact (follows one step commands)       Cueing  General Comments   Cueing Techniques: Verbal cues;Visual cues;Tactile cues  VSS   Exercises     Shoulder Instructions      Home Living Family/patient expects to be discharged to:: Unsure                                 Additional Comments: from ILF per chart; pt reports she resides at ALF where meals are provided but she does her own medication management      Prior Functioning/Environment Prior Level of Function : Needs assist;History of Falls (last six months);Patient poor historian/Family not available             Mobility Comments: reports walks with rollator to dining hall for all meals ADLs Comments: reports modified independent with  sponge bath and dressing    OT Problem List: Decreased strength;Decreased activity tolerance;Impaired balance (sitting and/or standing);Decreased cognition;Decreased knowledge of use of DME or AE   OT Treatment/Interventions: Self-care/ADL training;Therapeutic exercise;DME and/or AE instruction;Balance training;Patient/family education;Therapeutic activities;Cognitive remediation/compensation      OT Goals(Current goals can be found in the care plan section)   Acute Rehab OT Goals Patient Stated Goal: get better OT Goal Formulation: With patient Time For Goal Achievement: 01/09/24 Potential to Achieve Goals: Good   OT Frequency:  Min 1X/week    Co-evaluation              AM-PAC OT "6 Clicks" Daily Activity     Outcome Measure Help from another person eating meals?: None Help from another person taking care of personal grooming?: A Little Help from another person toileting, which includes using toliet, bedpan, or urinal?: A Little Help from another person bathing (including washing, rinsing, drying)?: A Little Help from another person to put on and taking off regular upper body clothing?: A Little Help from another person to put on and taking off regular lower body clothing?: A Little 6 Click Score: 19   End of Session Equipment Utilized During Treatment: Gait belt;Rolling walker (2 wheels) Nurse Communication: Mobility status  Activity Tolerance: Patient tolerated treatment well Patient left: in chair;with call bell/phone within reach;with chair alarm set  OT Visit Diagnosis: Unsteadiness on feet (R26.81);Muscle weakness (generalized) (M62.81)                Time: 2130-8657 OT Time Calculation (min): 18 min Charges:  OT General Charges $OT Visit: 1 Visit OT Evaluation $OT Eval Moderate Complexity: 1 Mod  Karilyn Ouch, OTR/L [facility] Acute Rehabilitation Office: [phone]   Emery Hans 12/26/2023, 10:43 AM

## 2023-12-26 NOTE — Assessment & Plan Note (Signed)
Resolved with bowel regimen.  

## 2023-12-26 NOTE — Assessment & Plan Note (Signed)
 CT lumbar spine was without any acute abnormalities.  L3-L5 degenerative disc changes. -Continue with lidocaine patch -Tylenol as needed

## 2023-12-26 NOTE — Progress Notes (Signed)
 Echocardiogram 2D Echocardiogram has been performed.  Tali Cleaves N Eesha Schmaltz,RDCS 12/26/2023, 3:46 PM

## 2023-12-26 NOTE — Assessment & Plan Note (Signed)
 -  Continue Lipitor

## 2023-12-26 NOTE — Evaluation (Addendum)
 Physical Therapy Evaluation Patient Details Name: [name] MRN: [id number] DOB: 01-02-1936 Today's Date: 12/26/2023  History of Present Illness  Pt is an 88 y.o. female presenting 4/12 with lower back pain 2 weeks and dizziness. Recent fall in March. CT lumbar spine with no acute fracture, CXR unremarkable, CTH/MRI brain with no acute abnormality. EKG with sinus rhythm with multiple premature ventricular complex. PMH includes chronic systolic and diastolic heart failure, HLD, HTN and dementia.  Clinical Impression   Pt admitted secondary to problem above with deficits below. PTA patient was living at ILF per chart. She reports using a rollator and walking to dining room for meals. Initially states she does not recall circumstances of fall in March, then later states she was dizzy prior to fall. (Pt is a poor historian with memory/cognitive deficits). All vestibular testing was negative, however pt with limited cervical ROM and unable to complete head impulse test due to this. Pt with +drop in BP from sit to stand (164/94 HR 94 to 131/111 HR 118--see vitals flowsheet for all measures). She reported dizziness with prolonged standing and walking, however BP partially recovered with standing 3 min and fully recovered to baseline after walking. Etiology of dizziness remains unclear. Pt currently requires min assist for sit to stand and CGA for ambulation with RW and reports of increasing dizziness. Anticipate patient will benefit from PT to address problems listed below.Will continue to follow acutely to maximize functional mobility independence and safety. With recent fall and continued dizziness, feet Patient will benefit from continued inpatient follow up therapy, <3 hours/day          If plan is discharge home, recommend the following: A little help with walking and/or transfers;A little help with bathing/dressing/bathroom;Direct supervision/assist for medications management;Direct  supervision/assist for financial management;Assist for transportation;Help with stairs or ramp for entrance;Supervision due to cognitive status   Can travel by private vehicle   Yes    Equipment Recommendations None recommended by PT  Recommendations for Other Services  OT consult    Functional Status Assessment Patient has had a recent decline in their functional status and demonstrates the ability to make significant improvements in function in a reasonable and predictable amount of time.     Precautions / Restrictions Precautions Precautions: Fall Recall of Precautions/Restrictions: Impaired     12/26/23 0001  Vestibular Assessment  General Observation supine in bed, denies current dizziness  Symptom Behavior  Subjective history of current problem pt reports several weeks of dizziness (including reporting dizziness when she recently fell). Cannot describe other than "dizzy" Denies sense of movement/vertigo  Type of Dizziness  Comment (pt unable to describe "dizzy")  Frequency of Dizziness daily, throughout day  Duration of Dizziness minutes  Symptom Nature Positional  Aggravating Factors Activity in general  Relieving Factors Head stationary;Lying supine  Progression of Symptoms Worse  History of similar episodes reports dizziness when she fell "a few weeks ago"  Oculomotor Exam  Oculomotor Alignment Abnormal (without glasses rt eye adducted; with glasses symmetrical)  Ocular ROM WNL  Spontaneous Absent  Gaze-induced  Absent  Smooth Pursuits Saccades (R eye when moving from left to rt)  Saccades Intact  Vestibulo-Ocular Reflex  VOR 1 Head Only (x 1 viewing) pt with very limited neck ROM and moving too slowly to elicit reflex  VOR Cancellation Comment (unable to move in large enough ROM or quickly enough to assess)  Auditory  Comments denies changes  Positional Testing  Dix-Hallpike Dix-Hallpike Right;Dix-Hallpike Left  Horizontal Canal Testing  Horizontal Canal  Right;Horizontal Canal Left  Dix-Hallpike Right  Dix-Hallpike Right Duration 0  Dix-Hallpike Right Symptoms No nystagmus  Dix-Hallpike Left  Dix-Hallpike Left Duration 0  Dix-Hallpike Left Symptoms No nystagmus  Horizontal Canal Right  Horizontal Canal Right Duration 0  Horizontal Canal Right Symptoms Normal  Horizontal Canal Left  Horizontal Canal Left Duration 0  Horizontal Canal Left Symptoms Normal  Cognition  Cognition Comment history of memory deficits; poor historian  Orthostatics  Orthostatics Comment see vitals flowsheet     Mobility  Bed Mobility Overal bed mobility: Modified Independent             General bed mobility comments: no rail, incr time and effort    Transfers Overall transfer level: Needs assistance Equipment used: Rolling walker (2 wheels) Transfers: Sit to/from Stand Sit to Stand: Min assist           General transfer comment: from EOB min assist progressing to CGA with multiple reps during session    Ambulation/Gait Ambulation/Gait assistance: Contact guard assist Gait Distance (Feet): 80 Feet Assistive device: Rolling walker (2 wheels) Gait Pattern/deviations: Step-through pattern, Decreased stride length, Trunk flexed   Gait velocity interpretation: 1.31 - 2.62 ft/sec, indicative of limited community ambulator   General Gait Details: reported dizziness with ambulation, however BP had recovered to baseline by end of walk  Stairs            Wheelchair Mobility     Tilt Bed    Modified Rankin (Stroke Patients Only)       Balance Overall balance assessment: Needs assistance Sitting-balance support: No upper extremity supported, Feet supported Sitting balance-Leahy Scale: Good     Standing balance support: Bilateral upper extremity supported, Reliant on assistive device for balance Standing balance-Leahy Scale: Poor                               Pertinent Vitals/Pain Pain Assessment Pain Assessment:  No/denies pain    Home Living Family/patient expects to be discharged to:: Unsure                   Additional Comments: from ILF per chart;    Prior Function Prior Level of Function : Needs assist;History of Falls (last six months);Patient poor historian/Family not available             Mobility Comments: reports walks with rollator to dining hall for all meals ADLs Comments: reports modified independent with sponge bath and dressing     Extremity/Trunk Assessment   Upper Extremity Assessment Upper Extremity Assessment: Defer to OT evaluation    Lower Extremity Assessment Lower Extremity Assessment: Generalized weakness    Cervical / Trunk Assessment Cervical / Trunk Assessment: Other exceptions Cervical / Trunk Exceptions: limited cervical ROM  Communication   Communication Communication: No apparent difficulties    Cognition Arousal: Alert Behavior During Therapy: WFL for tasks assessed/performed   PT - Cognitive impairments: History of cognitive impairments, Memory, Problem solving, Safety/Judgement                       PT - Cognition Comments: cannot recall details of recent fall (later states she was dizzy when she fell) Following commands: Intact       Cueing Cueing Techniques: Verbal cues, Visual cues, Tactile cues     General Comments      Exercises     Assessment/Plan    PT Assessment  Patient needs continued PT services  PT Problem List Decreased strength;Decreased range of motion;Decreased activity tolerance;Decreased balance;Decreased mobility;Decreased cognition;Decreased safety awareness;Cardiopulmonary status limiting activity       PT Treatment Interventions DME instruction;Gait training;Functional mobility training;Therapeutic activities;Therapeutic exercise;Balance training;Cognitive remediation;Patient/family education    PT Goals (Current goals can be found in the Care Plan section)  Acute Rehab PT Goals Patient  Stated Goal: stop feeling dizzy PT Goal Formulation: With patient Time For Goal Achievement: 01/09/24 Potential to Achieve Goals: Good    Frequency Min 2X/week     Co-evaluation               AM-PAC PT "6 Clicks" Mobility  Outcome Measure Help needed turning from your back to your side while in a flat bed without using bedrails?: A Little Help needed moving from lying on your back to sitting on the side of a flat bed without using bedrails?: None Help needed moving to and from a bed to a chair (including a wheelchair)?: A Little Help needed standing up from a chair using your arms (e.g., wheelchair or bedside chair)?: A Little Help needed to walk in hospital room?: A Little Help needed climbing 3-5 steps with a railing? : A Lot 6 Click Score: 18    End of Session Equipment Utilized During Treatment: Gait belt Activity Tolerance: Treatment limited secondary to medical complications (Comment) (ambulation limited by dizziness) Patient left: in chair;with call bell/phone within reach;with chair alarm set Nurse Communication: Mobility status;Other (comment) (orthostatic BP results) PT Visit Diagnosis: Other abnormalities of gait and mobility (R26.89);History of falling (Z91.81);Muscle weakness (generalized) (M62.81)    Time: 0812-0900 PT Time Calculation (min) (ACUTE ONLY): 48 min   Charges:   PT Evaluation $PT Eval Moderate Complexity: 1 Mod PT Treatments $Gait Training: 8-22 mins $Self Care/Home Management: 8-22 PT General Charges $$ ACUTE PT VISIT: 1 Visit          Gayle Kava, PT Acute Rehabilitation Services  Office [phone]   Guilford Leep 12/26/2023, 9:35 AM

## 2023-12-26 NOTE — TOC Initial Note (Signed)
 Transition of Care [facility]) - Initial/Assessment Note    Patient Details  Name: [name] MRN: [id number] Date of Birth: Apr 22, 1936  [facility] CM/SW Contact:    Carmon Christen, LCSWA Phone Number: 12/26/2023, 11:50 AM  Clinical Narrative:                  CSW received consult for possible SNF placement at time of discharge. Due to patients current orientation CSW spoke with patients daughter Lorelee Roger regarding PT recommendation of SNF placement at time of discharge. Patients daughter reports PTA patient comes from [facility].Patients daughter  expressed understanding of PT recommendation and is agreeable to SNF placement for patient at time of discharge. Patients daughter gave CSW permission to fax out initial referral for SNF placement. CSW discussed insurance authorization process and will provide Medicare SNF ratings list with accepted SNF bed offers when available.  No further questions reported at this time. CSW to continue to follow and assist with discharge planning needs.    Expected Discharge Plan: Skilled Nursing Facility Barriers to Discharge: Continued Medical Work up   Patient Goals and CMS Choice     Choice offered to / list presented to : Adult Children (daughter Lorelee Roger)      Expected Discharge Plan and Services In-house Referral: Clinical Social Work     Living arrangements for the past 2 months: Independent Living Facility ([facility])                                      Prior Living Arrangements/Services Living arrangements for the past 2 months: Independent Living Facility ([facility]) Lives with:: Self Patient language and need for interpreter reviewed:: Yes        Need for Family Participation in Patient Care: Yes (Comment) Care giver support system in place?: Yes (comment)   Criminal Activity/Legal Involvement Pertinent to Current Situation/Hospitalization: No - Comment as needed  Activities of Daily Living    ADL Screening (condition at time of admission) Independently performs ADLs?: Yes (appropriate for developmental age) Is the patient deaf or have difficulty hearing?: No Does the patient have difficulty seeing, even when wearing glasses/contacts?: No Does the patient have difficulty concentrating, remembering, or making decisions?: Yes  Permission Sought/Granted Permission sought to share information with : Case Manager, Family Supports, Magazine features editor                Emotional Assessment       Orientation: : Oriented to Self Alcohol / Substance Use: Not Applicable Psych Involvement: No (comment)  Admission diagnosis:  Postural dizziness with presyncope [R42, R55] Patient Active Problem List   Diagnosis Date Noted   Dizziness 12/25/2023   CHF (congestive heart failure) (HCC) 12/25/2023   Lower back pain 12/25/2023   Constipation 12/25/2023   Influenza A 09/13/2023   Pain in both knees 06/15/2023   Vitamin D deficiency 06/15/2023   Hyperglycemia, unspecified 06/15/2023   Dementia without behavioral disturbance (HCC) 02/23/2021   Closed fracture of left distal radius 07/31/2019   Closed nondisplaced fracture of lateral malleolus of right fibula 01/17/2019   PCP NOTES >>>>>>>>>>>>>>>> 09/03/2015   History of colonic polyps 04/19/2014   OAB (overactive bladder) 07/13/2012   Annual physical exam 12/17/2010   Aortic stenosis 01/01/2010   Hyperlipidemia 11/21/2008   GERD 11/28/2007   Depression 06/24/2007   Essential hypertension 06/24/2007   OSTEOARTHRITIS 06/24/2007   Osteoporosis  06/24/2007   PCP:  Ezell Hollow, MD Pharmacy:   [facility] [place], [place] - [address] [place]  [address] Phone: [phone] Fax: [phone]  [facility] [address] [place], [place] - [address] [place]  [address] Phone: [phone] Fax: [phone]     Social Drivers of  Health (SDOH) Social History: SDOH Screenings   Food Insecurity: No Food Insecurity (12/25/2023)  Housing: Low Risk  (12/25/2023)  Transportation Needs: No Transportation Needs (12/25/2023)  Utilities: Not At Risk (12/25/2023)  Alcohol Screen: Low Risk  (08/19/2023)  Depression (PHQ2-9): Low Risk  (08/19/2023)  Financial Resource Strain: Low Risk  (08/19/2023)  Physical Activity: Inactive (08/19/2023)  Social Connections: Moderately Integrated (12/25/2023)  Stress: No Stress Concern Present (08/19/2023)  Tobacco Use: Low Risk  (12/25/2023)  Health Literacy: Adequate Health Literacy (08/19/2023)   SDOH Interventions:     Readmission Risk Interventions     No data to display

## 2023-12-26 NOTE — Hospital Course (Addendum)
 Taken from H&P.  [name] is a 88 y.o. female with medical history significant of chronic systolic and diastolic heart failure, hyperlipidemia, hypertension and dementia presented to emergency department complaining about dizziness/lightheadedness for 1 week and lower back pain.  Patient was also constipated for more than a week.  Denies any syncope or presyncope.  Denies any chest pain, shortness of breath or palpitation  Of note, patient has been seen in the ED in 11/2023 for dizziness associated mechanical fall.  Unremarkable workup during that time.   At presentation to ED patient found hypertensive blood pressure 166/93 otherwise hemodynamically stable. CBC, CMP, UA, troponin, mag, Phos, respiratory panel unremarkable.   CT lumbar spine showing no acute lumbar spine fracture.  Degenerative disc and facet disease at L3-4 and L4-5. 3. 2 cm left adrenal gland lesion is an indeterminate finding. 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 Recommend follow-up noncontrast abdominal CT scan in 4-6 months to reassess. 4. Aortic atherosclerosis.   CT head no acute intracranial abnormality.Advanced chronic small vessel ischemic disease    Chest x-ray unremarkable.   EKG showing sinus rhythm with multiple premature ventricular complex.  Prolonged PR interval LA ventricular hypertrophy pattern.  Per chart review previous EKG has similar finding of prolonged PR interval and left ventricular hypertrophy pattern.  4/13: Vitals with mildly elevated blood pressure, UA unremarkable, troponin negative.  Respiratory panel negative.  MRI brain with no acute intracranial abnormality, patchy T2 flair hyperintensity involving supratentorial cerebral white matter and pons, most characteristic of chronic microvascular ischemic disease.  Patient was not taking any antihypertensives at home, starting on low-dose losartan-PCP can titrate.  Nursing concern of ST elevation on telemetry, EKG was  obtained which was without any acute changes.  Patient has chronic LBBB and similar changes on multiple prior EKGs.  Troponin negative.  Patient remained asymptomatic.  Mildly positive orthostatic vitals from sitting to standing with quick recovery. PT is recommending SNF  4/14: Hemodynamically stable, had a bed offer at [facility].  4/15: Patient remained hemodynamically stable.  Blood pressure slowly improving.  Patient will continue on low-dose losartan and her PCP can titrate the dose as appropriate.  Patient should encourage p.o. hydration.  She will continue the rest of her home medications and follow-up with her providers for further assistance.

## 2023-12-26 NOTE — Assessment & Plan Note (Signed)
 History of mechanical fall. Mildly positive orthostatic vitals. PT with no concern of vertigo.  CT head and MRI brain was negative for any acute abnormality. Echocardiogram with EF of 45 to 50%, concern for regional wall motion abnormalities and indeterminate diastolic function.  Moderate aortic stenosis.  Seems chronic, as similar abnormalities noted on prior echo done in 2020. Likely due to poor hydration.  Troponin remain negative - Encourage p.o. hydration - PT is recommending SNF

## 2023-12-26 NOTE — Assessment & Plan Note (Signed)
 Blood pressure remained elevated. Patient was not on any antihypertensives at home. - Start adding on low-dose losartan

## 2023-12-26 NOTE — Plan of Care (Signed)
  Problem: Clinical Measurements: Goal: Ability to maintain clinical measurements within normal limits will improve Outcome: Progressing Goal: Will remain free from infection Outcome: Progressing Goal: Diagnostic test results will improve Outcome: Progressing Goal: Respiratory complications will improve Outcome: Progressing Goal: Cardiovascular complication will be avoided Outcome: Progressing   Problem: Activity: Goal: Risk for activity intolerance will decrease Outcome: Progressing   Problem: Nutrition: Goal: Adequate nutrition will be maintained Outcome: Progressing   Problem: Coping: Goal: Level of anxiety will decrease Outcome: Progressing   Problem: Elimination: Goal: Will not experience complications related to bowel motility Outcome: Progressing Goal: Will not experience complications related to urinary retention Outcome: Progressing   Problem: Safety: Goal: Ability to remain free from injury will improve Outcome: Progressing   Problem: Skin Integrity: Goal: Risk for impaired skin integrity will decrease Outcome: Progressing   

## 2023-12-26 NOTE — Assessment & Plan Note (Signed)
 Echocardiogram with EF of 50%, do have some regional wall motion abnormalities but all of those abnormalities seems chronic.  No chest pain, no acute EKG changes and troponin remain negative.  Clinically appears euvolemic -Continue to monitor

## 2023-12-26 NOTE — Assessment & Plan Note (Signed)
-   Continue memantine, Effexor and donepezil

## 2023-12-26 NOTE — Progress Notes (Signed)
 RE: [name]   Date of Birth: Dec 26, 1935  Date: 12/26/2023    To Whom It May Concern:   Please be advised that the above-named patient has a primary diagnosis of dementia which supersedes any psychiatric diagnosis. Patient will require a short-term nursing home stay - anticipated 30 days or less for rehabilitation and strengthening. The plan is for return home.

## 2023-12-27 DIAGNOSIS — I10 Essential (primary) hypertension: Secondary | ICD-10-CM | POA: Diagnosis not present

## 2023-12-27 DIAGNOSIS — K5900 Constipation, unspecified: Secondary | ICD-10-CM | POA: Diagnosis not present

## 2023-12-27 DIAGNOSIS — R42 Dizziness and giddiness: Secondary | ICD-10-CM | POA: Diagnosis not present

## 2023-12-27 DIAGNOSIS — I5022 Chronic systolic (congestive) heart failure: Secondary | ICD-10-CM | POA: Diagnosis not present

## 2023-12-27 LAB — URINE CULTURE: Culture: NO GROWTH

## 2023-12-27 NOTE — Progress Notes (Signed)
 Progress Note   Patient: [name] AVW:[id number] DOB: 18-Sep-1935 DOA: 12/25/2023     0 DOS: the patient was seen and examined on 12/27/2023   Brief hospital course: Taken from H&P.  [name] is a 88 y.o. female with medical history significant of chronic systolic and diastolic heart failure, hyperlipidemia, hypertension and dementia presented to emergency department complaining about dizziness/lightheadedness for 1 week and lower back pain.  Patient was also constipated for more than a week.  Denies any syncope or presyncope.  Denies any chest pain, shortness of breath or palpitation  Of note, patient has been seen in the ED in 11/2023 for dizziness associated mechanical fall.  Unremarkable workup during that time.   At presentation to ED patient found hypertensive blood pressure 166/93 otherwise hemodynamically stable. CBC, CMP, UA, troponin, mag, Phos, respiratory panel unremarkable.   CT lumbar spine showing no acute lumbar spine fracture.  Degenerative disc and facet disease at L3-4 and L4-5. 3. 2 cm left adrenal gland lesion is an indeterminate finding. 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 Recommend follow-up noncontrast abdominal CT scan in 4-6 months to reassess. 4. Aortic atherosclerosis.   CT head no acute intracranial abnormality.Advanced chronic small vessel ischemic disease    Chest x-ray unremarkable.   EKG showing sinus rhythm with multiple premature ventricular complex.  Prolonged PR interval LA ventricular hypertrophy pattern.  Per chart review previous EKG has similar finding of prolonged PR interval and left ventricular hypertrophy pattern.  4/13: Vitals with mildly elevated blood pressure, UA unremarkable, troponin negative.  Respiratory panel negative.  MRI brain with no acute intracranial abnormality, patchy T2 flair hyperintensity involving supratentorial cerebral white matter and pons, most characteristic of chronic microvascular  ischemic disease.  Patient was not taking any antihypertensives at home, starting on low-dose losartan-PCP can titrate.  Nursing concern of ST elevation on telemetry, EKG was obtained which was without any acute changes.  Patient has chronic LBBB and similar changes on multiple prior EKGs.  Troponin negative.  Patient remained asymptomatic.  Mildly positive orthostatic vitals from sitting to standing with quick recovery. PT is recommending SNF  4/14: Hemodynamically stable, had a bed offer at [facility].   Assessment and Plan: * Dizziness History of mechanical fall. Mildly positive orthostatic vitals. PT with no concern of vertigo.  CT head and MRI brain was negative for any acute abnormality. Echocardiogram with EF of 45 to 50%, concern for regional wall motion abnormalities and indeterminate diastolic function.  Moderate aortic stenosis.  Seems chronic, as similar abnormalities noted on prior echo done in 2020. Likely due to poor hydration.  Troponin remain negative - Encourage p.o. hydration - PT is recommending SNF   Chronic HFrEF (heart failure with reduced ejection fraction) (HCC) Echocardiogram with EF of 50%, do have some regional wall motion abnormalities but all of those abnormalities seems chronic.  No chest pain, no acute EKG changes and troponin remain negative.  Clinically appears euvolemic -Continue to monitor  Essential hypertension Blood pressure remained elevated. Patient was not on any antihypertensives at home. - Start adding on low-dose losartan  Constipation Resolved with bowel regimen  Lower back pain CT lumbar spine was without any acute abnormalities.  L3-L5 degenerative disc changes. -Continue with lidocaine patch -Tylenol as needed  Dementia without behavioral disturbance (HCC) - Continue memantine, Effexor and donepezil  Hyperlipidemia - Continue Lipitor   Subjective: Patient was seen and examined today.  No new  concern.  Denies any more dizziness.  Physical Exam: Vitals:   12/26/23 2101 12/27/23 0509 12/27/23 0817 12/27/23 1703  BP: (!) 143/71 (!) 157/79 (!) 145/78 128/89  Pulse: 78 74 80 96  Resp: 16 16 16    Temp: 97.9 F (36.6 C) 98.4 F (36.9 C) 98 F (36.7 C) 97.9 F (36.6 C)  TempSrc:   Oral   SpO2: 95% 93% 93% 92%  Weight:      Height:       General.  Well-developed elderly lady, in no acute distress. Pulmonary.  Lungs clear bilaterally, normal respiratory effort. CV.  Regular rate and rhythm, no JVD, rub or murmur. Abdomen.  Soft, nontender, nondistended, BS positive. CNS.  Alert and oriented .  No focal neurologic deficit. Extremities.  No edema, no cyanosis, pulses intact and symmetrical.  Data Reviewed: Prior data reviewed  Family Communication:   Disposition: Status is: Observation The patient remains OBS appropriate and will d/c before 2 midnights.  Planned Discharge Destination: Skilled nursing facility  Time spent: 42 minutes  This record has been created using Conservation officer, historic buildings. Errors have been sought and corrected,but may not always be located. Such creation errors do not reflect on the standard of care.   Author: Luna Salinas, MD 12/27/2023 5:57 PM  For on call review [email or web address].

## 2023-12-27 NOTE — Progress Notes (Signed)
 Mobility Specialist: Progress Note   12/27/23 1554  Mobility  Activity Ambulated with assistance in hallway  Level of Assistance Contact guard assist, steadying assist  Assistive Device Front wheel walker  Distance Ambulated (ft) 225 ft  Activity Response Tolerated well  Mobility Referral Yes  Mobility visit 1 Mobility  Mobility Specialist Start Time (ACUTE ONLY) 1415  Mobility Specialist Stop Time (ACUTE ONLY) 1436  Mobility Specialist Time Calculation (min) (ACUTE ONLY) 21 min    Pt was agreeable to mobility session - received in chair. Heavy minA for STS with posterior lean upon standing. Initially minA for ambulation but pt progressed to minG with min cues needed for redirection. No complaints. Returned to room without fault. MinA for bed mobility to assist BLEs.  Left in bed with all needs met, call bell in reach.    Deloria Fetch Mobility Specialist Please contact via SecureChat or Rehab office at [phone]

## 2023-12-27 NOTE — Progress Notes (Signed)
 Mobility Specialist: Progress Note   12/27/23 1231  Mobility  Activity Transferred from bed to chair  Level of Assistance Contact guard assist, steadying assist  Assistive Device Front wheel walker  Activity Response Tolerated well  Mobility Referral Yes  Mobility visit 1 Mobility  Mobility Specialist Start Time (ACUTE ONLY) [id number]  Mobility Specialist Stop Time (ACUTE ONLY) 0913  Mobility Specialist Time Calculation (min) (ACUTE ONLY) 21 min    Pre Mobility: BP (lying): 149/72 During Mobility:   BP (sitting EOB): 161/88  BP (stand 0 minutes): 148/94  Pt was agreeable to mobility session - received in bed. MinA for bed mobility to assist with scooting and trunk elevation. MinA for STS. MinG for stand pivot to chair. C/o of slight dizziness but stated it was not as bad as it was before. Left in chair with all needs met, call bell in reach.   Deloria Fetch Mobility Specialist Please contact via SecureChat or Rehab office at [phone]

## 2023-12-27 NOTE — Assessment & Plan Note (Signed)
 History of mechanical fall. Mildly positive orthostatic vitals. PT with no concern of vertigo.  CT head and MRI brain was negative for any acute abnormality. Echocardiogram with EF of 45 to 50%, concern for regional wall motion abnormalities and indeterminate diastolic function.  Moderate aortic stenosis.  Seems chronic, as similar abnormalities noted on prior echo done in 2020. Likely due to poor hydration.  Troponin remain negative - Encourage p.o. hydration - PT is recommending SNF

## 2023-12-27 NOTE — Plan of Care (Signed)

## 2023-12-27 NOTE — TOC Progression Note (Addendum)
 Transition of Care [facility]) - Progression Note    Patient Details  Name: [name] MRN: [id number] Date of Birth: 10-06-1935  [facility] CM/SW Contact  Katrinka Parr, [place] Phone Number: 12/27/2023, 4:27 PM  Clinical Narrative:     CSW met with pt and provided SNF bed offers. CSW called pt's daughter over speaker phone to include her in conversation. Pt defers to daughter. Daughter chooses [facility] which pt is agreeable to.  CSW explained SNF auth process. Daughter would like to transport pt to SNF.   Confirmed with [facility] that they can admit pt once Siegfried Dress is received.   CSW submitted SNF auth request in online portal. Auth [id number] [facility] is pending for [facility]   Expected Discharge Plan: Skilled Nursing Facility Barriers to Discharge: Insurance auth xpected Discharge Plan and Services In-house Referral: Clinical Social Work     Living arrangements for the past 2 months: Independent Living Facility ([facility])                                       Social Determinants of Health (SDOH) Interventions SDOH Screenings   Food Insecurity: No Food Insecurity (12/25/2023)  Housing: Low Risk  (12/25/2023)  Transportation Needs: No Transportation Needs (12/25/2023)  Utilities: Not At Risk (12/25/2023)  Alcohol Screen: Low Risk  (08/19/2023)  Depression (PHQ2-9): Low Risk  (08/19/2023)  Financial Resource Strain: Low Risk  (08/19/2023)  Physical Activity: Inactive (08/19/2023)  Social Connections: Moderately Integrated (12/25/2023)  Stress: No Stress Concern Present (08/19/2023)  Tobacco Use: Low Risk  (12/25/2023)  Health Literacy: Adequate Health Literacy (08/19/2023)    Readmission Risk Interventions     No data to display

## 2023-12-28 DIAGNOSIS — M6281 Muscle weakness (generalized): Secondary | ICD-10-CM | POA: Diagnosis not present

## 2023-12-28 DIAGNOSIS — I110 Hypertensive heart disease with heart failure: Secondary | ICD-10-CM | POA: Diagnosis not present

## 2023-12-28 DIAGNOSIS — E785 Hyperlipidemia, unspecified: Secondary | ICD-10-CM | POA: Diagnosis not present

## 2023-12-28 DIAGNOSIS — M545 Low back pain, unspecified: Secondary | ICD-10-CM | POA: Diagnosis not present

## 2023-12-28 DIAGNOSIS — M25561 Pain in right knee: Secondary | ICD-10-CM | POA: Diagnosis not present

## 2023-12-28 DIAGNOSIS — K5900 Constipation, unspecified: Secondary | ICD-10-CM | POA: Diagnosis not present

## 2023-12-28 DIAGNOSIS — I493 Ventricular premature depolarization: Secondary | ICD-10-CM | POA: Diagnosis not present

## 2023-12-28 DIAGNOSIS — M25562 Pain in left knee: Secondary | ICD-10-CM | POA: Diagnosis not present

## 2023-12-28 DIAGNOSIS — M810 Age-related osteoporosis without current pathological fracture: Secondary | ICD-10-CM | POA: Diagnosis not present

## 2023-12-28 DIAGNOSIS — I5043 Acute on chronic combined systolic (congestive) and diastolic (congestive) heart failure: Secondary | ICD-10-CM | POA: Diagnosis not present

## 2023-12-28 DIAGNOSIS — K219 Gastro-esophageal reflux disease without esophagitis: Secondary | ICD-10-CM | POA: Diagnosis not present

## 2023-12-28 DIAGNOSIS — E559 Vitamin D deficiency, unspecified: Secondary | ICD-10-CM | POA: Diagnosis not present

## 2023-12-28 DIAGNOSIS — R42 Dizziness and giddiness: Secondary | ICD-10-CM | POA: Diagnosis not present

## 2023-12-28 DIAGNOSIS — Z79899 Other long term (current) drug therapy: Secondary | ICD-10-CM | POA: Diagnosis not present

## 2023-12-28 DIAGNOSIS — R278 Other lack of coordination: Secondary | ICD-10-CM | POA: Diagnosis not present

## 2023-12-28 DIAGNOSIS — I951 Orthostatic hypotension: Secondary | ICD-10-CM | POA: Diagnosis not present

## 2023-12-28 DIAGNOSIS — M1990 Unspecified osteoarthritis, unspecified site: Secondary | ICD-10-CM | POA: Diagnosis not present

## 2023-12-28 DIAGNOSIS — R2681 Unsteadiness on feet: Secondary | ICD-10-CM | POA: Diagnosis not present

## 2023-12-28 DIAGNOSIS — I5022 Chronic systolic (congestive) heart failure: Secondary | ICD-10-CM | POA: Diagnosis not present

## 2023-12-28 DIAGNOSIS — I350 Nonrheumatic aortic (valve) stenosis: Secondary | ICD-10-CM | POA: Diagnosis not present

## 2023-12-28 DIAGNOSIS — Z85828 Personal history of other malignant neoplasm of skin: Secondary | ICD-10-CM | POA: Diagnosis not present

## 2023-12-28 DIAGNOSIS — R739 Hyperglycemia, unspecified: Secondary | ICD-10-CM | POA: Diagnosis not present

## 2023-12-28 DIAGNOSIS — I10 Essential (primary) hypertension: Secondary | ICD-10-CM | POA: Diagnosis not present

## 2023-12-28 MED ORDER — LOSARTAN POTASSIUM 25 MG PO TABS: 25.0000 mg | ORAL_TABLET | Freq: Every day | ORAL | Status: DC

## 2023-12-28 MED ORDER — LIDOCAINE 5 % EX PTCH: 1.0000 | MEDICATED_PATCH | CUTANEOUS | Status: DC

## 2023-12-28 MED ORDER — MECLIZINE HCL 12.5 MG PO TABS: 12.5000 mg | ORAL_TABLET | Freq: Two times a day (BID) | ORAL | Status: AC | PRN

## 2023-12-28 MED ORDER — POLYETHYLENE GLYCOL 3350 17 G PO PACK: 17.0000 g | PACK | Freq: Every day | ORAL | Status: DC | PRN

## 2023-12-28 NOTE — Progress Notes (Signed)
 Mobility Specialist: Progress Note   12/28/23 1102  Mobility  Activity Ambulated with assistance in hallway  Level of Assistance Contact guard assist, steadying assist  Assistive Device Front wheel walker  Distance Ambulated (ft) 250 ft  Activity Response Tolerated well  Mobility Referral Yes  Mobility visit 1 Mobility  Mobility Specialist Start Time (ACUTE ONLY) 0914  Mobility Specialist Stop Time (ACUTE ONLY) 0929  Mobility Specialist Time Calculation (min) (ACUTE ONLY) 15 min    Pt was agreeable to mobility session - received in bed. MinA for bed mobility to assist with trunk elevation and scooting. Heavy minA for STS with heavy posterior lean upon standing. CG for hallway ambulation. No complaints. Returned to room without fault. Left in chair with all needs met, call bell in reach. Chair alarm on.   Deloria Fetch Mobility Specialist Please contact via SecureChat or Rehab office at [phone]

## 2023-12-28 NOTE — Plan of Care (Signed)

## 2023-12-28 NOTE — Progress Notes (Signed)
 Report called to [place] at [facility].

## 2023-12-28 NOTE — Discharge Summary (Signed)
 Physician Discharge Summary   Patient: [name] MRN: [id number] DOB: Oct 30, 1935  Admit date:     12/25/2023  Discharge date: 12/28/23  Discharge Physician: Luna Salinas   PCP: Ezell Hollow, MD   Recommendations at discharge:  Please obtain CBC and BMP on follow-up Please titrate the dose of losartan to optimize blood pressure control Follow-up with primary care provider within a week  Discharge Diagnoses: Principal Problem:   Dizziness Active Problems:   Chronic HFrEF (heart failure with reduced ejection fraction) (HCC)   Essential hypertension   Hyperlipidemia   Dementia without behavioral disturbance (HCC)   Lower back pain   Constipation   Hospital Course: Taken from H&P.  [name] is a 88 y.o. female with medical history significant of chronic systolic and diastolic heart failure, hyperlipidemia, hypertension and dementia presented to emergency department complaining about dizziness/lightheadedness for 1 week and lower back pain.  Patient was also constipated for more than a week.  Denies any syncope or presyncope.  Denies any chest pain, shortness of breath or palpitation  Of note, patient has been seen in the ED in 11/2023 for dizziness associated mechanical fall.  Unremarkable workup during that time.   At presentation to ED patient found hypertensive blood pressure 166/93 otherwise hemodynamically stable. CBC, CMP, UA, troponin, mag, Phos, respiratory panel unremarkable.   CT lumbar spine showing no acute lumbar spine fracture.  Degenerative disc and facet disease at L3-4 and L4-5. 3. 2 cm left adrenal gland lesion is an indeterminate finding. 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 Recommend follow-up noncontrast abdominal CT scan in 4-6 months to reassess. 4. Aortic atherosclerosis.   CT head no acute intracranial abnormality.Advanced chronic small vessel ischemic disease    Chest x-ray unremarkable.   EKG showing sinus rhythm with  multiple premature ventricular complex.  Prolonged PR interval LA ventricular hypertrophy pattern.  Per chart review previous EKG has similar finding of prolonged PR interval and left ventricular hypertrophy pattern.  4/13: Vitals with mildly elevated blood pressure, UA unremarkable, troponin negative.  Respiratory panel negative.  MRI brain with no acute intracranial abnormality, patchy T2 flair hyperintensity involving supratentorial cerebral white matter and pons, most characteristic of chronic microvascular ischemic disease.  Patient was not taking any antihypertensives at home, starting on low-dose losartan-PCP can titrate.  Nursing concern of ST elevation on telemetry, EKG was obtained which was without any acute changes.  Patient has chronic LBBB and similar changes on multiple prior EKGs.  Troponin negative.  Patient remained asymptomatic.  Mildly positive orthostatic vitals from sitting to standing with quick recovery. PT is recommending SNF  4/15: Patient remained hemodynamically stable.  Blood pressure slowly improving.  Patient will continue on low-dose losartan and her PCP can titrate the dose as appropriate.  Patient should encourage p.o. hydration.  She will continue the rest of her home medications and follow-up with her providers for further assistance.  Assessment and Plan: * Dizziness History of mechanical fall. Mildly positive orthostatic vitals. PT with no concern of vertigo.  CT head and MRI brain was negative for any acute abnormality. Echocardiogram with EF of 45 to 50%, concern for regional wall motion abnormalities and indeterminate diastolic function.  Moderate aortic stenosis.  Seems chronic, as similar abnormalities noted on prior echo done in 2020. Likely due to poor hydration.  Troponin remain negative - Encourage p.o. hydration - PT is recommending SNF   Chronic HFrEF (heart failure with reduced ejection fraction) (HCC) Echocardiogram with EF  of 50%, do have  some regional wall motion abnormalities but all of those abnormalities seems chronic.  No chest pain, no acute EKG changes and troponin remain negative.  Clinically appears euvolemic -Continue to monitor  Essential hypertension Blood pressure remained elevated. Patient was not on any antihypertensives at home. - Start adding on low-dose losartan  Constipation Resolved with bowel regimen Continue as needed bowel regimen  Lower back pain CT lumbar spine was without any acute abnormalities.  L3-L5 degenerative disc changes. -Continue with lidocaine patch -Tylenol as needed  Dementia without behavioral disturbance (HCC) - Continue memantine, Effexor and donepezil  Hyperlipidemia - Continue Lipitor  Consultants: None Procedures performed: None Disposition: Skilled nursing facility Diet recommendation:  Cardiac diet DISCHARGE MEDICATION: Allergies as of 12/28/2023   No Known Allergies      Medication List     STOP taking these medications    donepezil 5 MG tablet Commonly known as: ARICEPT       TAKE these medications    acetaminophen 325 MG tablet Commonly known as: TYLENOL Take 650 mg by mouth in the morning and at bedtime.   cholecalciferol 25 MCG (1000 UNIT) tablet Commonly known as: VITAMIN D3 Take 1,000 Units by mouth in the morning and at bedtime.   lidocaine 5 % Commonly known as: LIDODERM Place 1 patch onto the skin daily. Remove & Discard patch within 12 hours or as directed by MD   losartan 25 MG tablet Commonly known as: COZAAR Take 1 tablet (25 mg total) by mouth daily. Start taking on: December 29, 2023   meclizine 12.5 MG tablet Commonly known as: ANTIVERT Take 1 tablet (12.5 mg total) by mouth 2 (two) times daily as needed for dizziness.   memantine 5 MG tablet Commonly known as: NAMENDA Take 1 tablet (5 mg total) by mouth 2 (two) times daily.   omeprazole 20 MG capsule Commonly known as: PRILOSEC Take 1 capsule (20 mg total) by mouth 2  (two) times daily before a meal.   polyethylene glycol 17 g packet Commonly known as: MIRALAX / GLYCOLAX Take 17 g by mouth daily as needed.   simvastatin 20 MG tablet Commonly known as: ZOCOR Take 1 tablet (20 mg total) by mouth daily.   venlafaxine XR 75 MG 24 hr capsule Commonly known as: Effexor XR Take 1 capsule (75 mg total) by mouth daily with breakfast.        Follow-up Information     Ezell Hollow, MD. Schedule an appointment as soon as possible for a visit in 1 week(s).   Specialty: Internal Medicine Contact information: [address] [place] [address] [phone]                Discharge Exam: [name] Weights   12/25/23 2100  Weight: 83.1 kg   General.  Well-developed lady, in no acute distress. Pulmonary.  Lungs clear bilaterally, normal respiratory effort. CV.  Regular rate and rhythm, no JVD, rub or murmur. Abdomen.  Soft, nontender, nondistended, BS positive. CNS.  Alert and oriented .  No focal neurologic deficit. Extremities.  No edema, no cyanosis, pulses intact and symmetrical. Psychiatry.  Judgment and insight appears normal.   Condition at discharge: stable  The results of significant diagnostics from this hospitalization (including imaging, microbiology, ancillary and laboratory) are listed below for reference.   Imaging Studies: ECHOCARDIOGRAM COMPLETE Result Date: 12/26/2023    ECHOCARDIOGRAM REPORT   Patient Name:   [name] Date of Exam: 12/26/2023  Medical Rec #:  [id number]     Height:       65.0 in Accession #:    [id number]    Weight:       183.2 lb Date of Birth:  1935/11/30     BSA:          1.906 m Patient Age:    87 years      BP:           155/84 mmHg Patient Gender: F             HR:           76 bpm. Exam Location:  Inpatient Procedure: 2D Echo, 3D Echo, Color Doppler, Cardiac Doppler and Strain Analysis            (Both Spectral and Color Flow Doppler were utilized during            procedure). Indications:     Abnormal ECG  History:        Patient has prior history of Echocardiogram examinations, most                 recent 05/25/2019. Risk Factors:Hypertension and Dyslipidemia.  Sonographer:    Travis Friedman RDCS Referring Phys: [id number] SUBRINA SUNDIL IMPRESSIONS  1. Left ventricular ejection fraction, by estimation, is 45 to 50%. Left ventricular ejection fraction by 3D volume is 49 %. The left ventricle has mildly decreased function. The left ventricle demonstrates regional wall motion abnormalities (see scoring diagram/findings for description). Left ventricular diastolic parameters are indeterminate. The average left ventricular global longitudinal strain is -14.8 %. The global longitudinal strain is abnormal.  2. Right ventricular systolic function is normal. The right ventricular size is normal.  3. Left atrial size was moderately dilated.  4. The mitral valve is degenerative. No evidence of mitral valve regurgitation. No evidence of mitral stenosis.  5. The aortic valve is calcified. Aortic valve regurgitation is not visualized. Moderate aortic valve stenosis. Aortic valve mean gradient measures 27.0 mmHg.  6. The inferior vena cava is normal in size with greater than 50% respiratory variability, suggesting right atrial pressure of 3 mmHg. Comparison(s): Prior images reviewed side by side. Increased aortic gradients. FINDINGS  Left Ventricle: Left ventricular ejection fraction, by estimation, is 45 to 50%. Left ventricular ejection fraction by 3D volume is 49 %. The left ventricle has mildly decreased function. The left ventricle demonstrates regional wall motion abnormalities. The average left ventricular global longitudinal strain is -14.8 %. Strain was performed and the global longitudinal strain is abnormal. The left ventricular internal cavity size was normal in size. There is no left ventricular hypertrophy. Left ventricular diastolic parameters are indeterminate.  LV Wall Scoring: The mid anteroseptal  segment, apical anterior segment, and apical inferior segment are dyskinetic. The inferior septum, basal anteroseptal segment, and mid inferior segment are hypokinetic. The anterior wall, entire lateral wall, and basal inferior segment are normal. Right Ventricle: The right ventricular size is normal. No increase in right ventricular wall thickness. Right ventricular systolic function is normal. Left Atrium: Left atrial size was moderately dilated. Right Atrium: Right atrial size was normal in size. Pericardium: There is no evidence of pericardial effusion. Mitral Valve: The mitral valve is degenerative in appearance. No evidence of mitral valve regurgitation. No evidence of mitral valve stenosis. Tricuspid Valve: The tricuspid valve is normal in structure. Tricuspid valve regurgitation is not demonstrated. No evidence of tricuspid stenosis. Aortic Valve: The aortic valve is calcified. Aortic valve regurgitation is  not visualized. Moderate aortic stenosis is present. Aortic valve mean gradient measures 27.0 mmHg. Aortic valve peak gradient measures 44.8 mmHg. Aortic valve area, by VTI measures 1.27 cm. Pulmonic Valve: The pulmonic valve was not well visualized. Pulmonic valve regurgitation is not visualized. Aorta: The aortic root and ascending aorta are structurally normal, with no evidence of dilitation. Venous: The inferior vena cava is normal in size with greater than 50% respiratory variability, suggesting right atrial pressure of 3 mmHg. IAS/Shunts: The atrial septum is grossly normal. Additional Comments: 3D was performed not requiring image post processing on an independent workstation and was abnormal.  LEFT VENTRICLE PLAX 2D LVIDd:         4.30 cm LVIDs:         3.20 cm         2D Longitudinal LV PW:         0.90 cm         Strain LV IVS:        1.00 cm         2D Strain GLS   -14.8 % LVOT diam:     2.10 cm         Avg: LV SV:         93 LV SV Index:   49              3D Volume EF LVOT Area:     3.46 cm         LV 3D EF:    Left                                             ventricul                                             ar                                             ejection                                             fraction                                             by 3D                                             volume is                                             49 %.  3D Volume EF:                                3D EF:        49 %                                LV EDV:       158 ml                                LV ESV:       81 ml                                LV SV:        77 ml RIGHT VENTRICLE RV Basal diam:  2.40 cm RV S prime:     10.90 cm/s TAPSE (M-mode): 1.9 cm LEFT ATRIUM              Index        RIGHT ATRIUM           Index LA Vol (A2C):   111.0 ml 58.25 ml/m  RA Area:     11.50 cm LA Vol (A4C):   61.8 ml  32.43 ml/m  RA Volume:   21.80 ml  11.44 ml/m LA Biplane Vol: 83.6 ml  43.87 ml/m  AORTIC VALVE AV Area (Vmax):    1.29 cm AV Area (Vmean):   1.30 cm AV Area (VTI):     1.27 cm AV Vmax:           334.50 cm/s AV Vmean:          235.250 cm/s AV VTI:            0.733 m AV Peak Grad:      44.8 mmHg AV Mean Grad:      27.0 mmHg LVOT Vmax:         124.67 cm/s LVOT Vmean:        88.367 cm/s LVOT VTI:          0.269 m LVOT/AV VTI ratio: 0.37  AORTA Ao Root diam: 3.10 cm Ao Asc diam:  3.20 cm MITRAL VALVE MV Area (PHT): 6.22 cm     SHUNTS MV Decel Time: 122 msec     Systemic VTI:  0.27 m MV E velocity: 127.00 cm/s  Systemic Diam: 2.10 cm MV A velocity: 78.60 cm/s MV E/A ratio:  1.62 Gloriann Larger MD Electronically signed by Gloriann Larger MD Signature Date/Time: 12/26/2023/4:03:23 PM    Final    MR BRAIN W WO CONTRAST Result Date: 12/26/2023 CLINICAL DATA:  Initial evaluation for acute dizziness. EXAM: MRI HEAD WITHOUT AND WITH CONTRAST TECHNIQUE: Multiplanar, multiecho pulse sequences of the brain and surrounding structures were obtained  without and with intravenous contrast. CONTRAST:  8mL GADAVIST GADOBUTROL 1 MMOL/ML IV SOLN COMPARISON:  CT from 12/25/2023. FINDINGS: Brain: Cerebral volume within normal limits. Patchy T2/FLAIR hyperintensity involving the periventricular deep white matter both cerebral hemispheres as well as the pons, consistent with chronic small vessel ischemic disease, fairly advanced in nature. No abnormal foci of restricted diffusion to suggest acute or subacute ischemia. Gray-white matter differentiation maintained. No areas of chronic cortical infarction. No acute or chronic intracranial blood products.  No mass lesion, midline shift or mass effect. No hydrocephalus or extra-axial fluid collection. Pituitary gland within normal limits. No abnormal enhancement. Vascular: Major intracranial vascular flow voids are maintained. Skull and upper cervical spine: Craniocervical junction within normal limits. Bone marrow signal intensity normal. Hyperostosis frontalis interna noted. No scalp soft tissue abnormality. Sinuses/Orbits: Prior bilateral ocular lens replacement. Scattered mucosal thickening noted about the sphenoid ethmoidal sinuses. Paranasal sinuses are otherwise largely clear. No significant mastoid effusion. Other: None. IMPRESSION: 1. No acute intracranial infarct or other abnormality. No mass lesion. 2. Patchy T2/FLAIR hyperintensity involving the supratentorial cerebral white matter and pons, most characteristic of chronic microvascular ischemic disease. Overall, appearance is fairly advanced in nature. Electronically Signed   By: Virgia Griffins M.D.   On: 12/26/2023 02:50   DG Chest Port 1 View Result Date: 12/25/2023 CLINICAL DATA:  Fatigue EXAM: PORTABLE CHEST 1 VIEW COMPARISON:  11/21/2023 FINDINGS: heart upper limits normal in size. Mediastinal contours within normal limits. Aortic atherosclerosis. Coarsened interstitial prominence in the mid and lower lungs, likely chronic lung disease. No acute  confluent opacities or effusions. No acute bony abnormality. IMPRESSION: Chronic changes.  No active disease. Electronically Signed   By: Janeece Mechanic M.D.   On: 12/25/2023 20:01   CT Lumbar Spine Wo Contrast Result Date: 12/25/2023 CLINICAL DATA:  Back trauma with pain. EXAM: CT LUMBAR SPINE WITHOUT CONTRAST TECHNIQUE: Multidetector CT imaging of the lumbar spine was performed without intravenous contrast administration. Multiplanar CT image reconstructions were also generated. RADIATION DOSE REDUCTION: This exam was performed according to the departmental dose-optimization program which includes automated exposure control, adjustment of the mA and/or kV according to patient size and/or use of iterative reconstruction technique. COMPARISON:  None Available. FINDINGS: Segmentation: There are five lumbar type vertebral bodies. The last full intervertebral disc space is labeled L5-S1. Alignment: Normal overall alignment. There is mild degenerative anterolisthesis of L5. Vertebrae: No acute lumbar spine fracture. No bone lesions. No pars defects. Mild diffuse osteoporosis. Paraspinal and other soft tissues: 2 cm left adrenal gland lesion noted. This measures 25 Hounsfield units and is an indeterminate finding. 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 Recommend follow-up noncontrast chest CT in 4-6 months to reassess. No renal lesions. Aortic calcifications but no aneurysm. Disc levels: No significant findings at T12-L1, L1-2 or L2-3. L3-4: Bulging annulus, osteophytic ridging and facet disease contributing to mild spinal and bilateral lateral recess stenosis no foraminal stenosis. L4-5: Diffuse annular bulge and advanced facet disease but no significant spinal or foraminal stenosis. Mild bilateral lateral recess stenosis. L5-S1: Disc disease and facet disease but no disc protrusions, spinal or foraminal stenosis. IMPRESSION: 1. No acute lumbar spine fracture. 2. Degenerative disc and facet disease  at L3-4 and L4-5. 3. 2 cm left adrenal gland lesion is an indeterminate finding. 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 Recommend follow-up noncontrast abdominal CT scan in 4-6 months to reassess. 4. Aortic atherosclerosis. Electronically Signed   By: Marrian Siva M.D.   On: 12/25/2023 17:22   CT Head Wo Contrast Result Date: 12/25/2023 CLINICAL DATA:  Headache, new onset (Age >= 51y). EXAM: CT HEAD WITHOUT CONTRAST TECHNIQUE: Contiguous axial images were obtained from the base of the skull through the vertex without intravenous contrast. RADIATION DOSE REDUCTION: This exam was performed according to the departmental dose-optimization program which includes automated exposure control, adjustment of the mA and/or kV according to patient size and/or use of iterative reconstruction technique. COMPARISON:  Head CT 11/21/2023 FINDINGS:  Brain: There is no evidence of an acute infarct, intracranial hemorrhage, mass, midline shift, or extra-axial fluid collection. Mild cerebral atrophy is unchanged and within normal limits for age. Confluent cerebral white matter hypodensities are unchanged and nonspecific but compatible with advanced chronic small vessel ischemic disease. Vascular: Calcified atherosclerosis at the skull base. No hyperdense vessel. Skull: No acute fracture or suspicious lesion. Sinuses/Orbits: Scattered mild mucosal thickening and small volume fluid in the included paranasal sinuses. Included mastoid air cells are clear. Bilateral cataract extraction. Other: None. IMPRESSION: 1. No evidence of acute intracranial abnormality. 2. Advanced chronic small vessel ischemic disease. Electronically Signed   By: Aundra Lee M.D.   On: 12/25/2023 17:17    Microbiology: Results for orders placed or performed during the hospital encounter of 12/25/23  Urine Culture     Status: None   Collection Time: 12/25/23  3:46 PM   Specimen: Urine, Clean Catch  Result Value Ref Range Status    Specimen Description   Final    URINE, CLEAN CATCH Performed at [facility], [address] [place], [place] [address]    Special Requests   Final    NONE Performed at [facility], [address] [place], [place] [address]    Culture   Final    NO GROWTH Performed at [facility], [address] [place], [place] [address]    Report Status 12/27/2023 FINAL  Final  Resp panel by RT-PCR (RSV, Flu A&B, Covid) Anterior Nasal Swab     Status: None   Collection Time: 12/25/23  7:55 PM   Specimen: Anterior Nasal Swab  Result Value Ref Range Status   SARS Coronavirus 2 by RT PCR NEGATIVE NEGATIVE Final    Comment: (NOTE) SARS-CoV-2 target nucleic acids are NOT DETECTED.  The SARS-CoV-2 RNA is generally detectable in upper respiratory specimens during the acute phase of infection. The lowest concentration of SARS-CoV-2 viral copies this assay can detect is 138 copies/mL. A negative result does not preclude SARS-Cov-2 infection and should not be used as the sole basis for treatment or other patient management decisions. A negative result may occur with  improper specimen collection/handling, submission of specimen other than nasopharyngeal swab, presence of viral mutation(s) within the areas targeted by this assay, and inadequate number of viral copies(<138 copies/mL). A negative result must be combined with clinical observations, patient history, and epidemiological information. The expected result is Negative.  Fact Sheet for Patients:  BloggerCourse.com  Fact Sheet for Healthcare Providers:  SeriousBroker.it  This test is no t yet approved or cleared by the [place]  FDA and  has been authorized for detection and/or diagnosis of SARS-CoV-2 by FDA under an Emergency Use Authorization (EUA). This EUA will remain  in effect (meaning this test can be used) for the duration of  the COVID-19 declaration under Section 564(b)(1) of the Act, 21 U.S.C.section 360bbb-3(b)(1), unless the authorization is terminated  or revoked sooner.       Influenza A by PCR NEGATIVE NEGATIVE Final   Influenza B by PCR NEGATIVE NEGATIVE Final    Comment: (NOTE) The Xpert Xpress SARS-CoV-2/FLU/RSV plus assay is intended as an aid in the diagnosis of influenza from Nasopharyngeal swab specimens and should not be used as a sole basis for treatment. Nasal washings and aspirates are unacceptable for Xpert Xpress SARS-CoV-2/FLU/RSV testing.  Fact Sheet for Patients: BloggerCourse.com  Fact Sheet for Healthcare Providers: SeriousBroker.it  This test is not yet approved or cleared by the [place]   FDA and has been authorized for detection and/or diagnosis of SARS-CoV-2 by FDA under an Emergency Use Authorization (EUA). This EUA will remain in effect (meaning this test can be used) for the duration of the COVID-19 declaration under Section 564(b)(1) of the Act, 21 U.S.C. section 360bbb-3(b)(1), unless the authorization is terminated or revoked.     Resp Syncytial Virus by PCR NEGATIVE NEGATIVE Final    Comment: (NOTE) Fact Sheet for Patients: BloggerCourse.com  Fact Sheet for Healthcare Providers: SeriousBroker.it  This test is not yet approved or cleared by the [place]  FDA and has been authorized for detection and/or diagnosis of SARS-CoV-2 by FDA under an Emergency Use Authorization (EUA). This EUA will remain in effect (meaning this test can be used) for the duration of the COVID-19 declaration under Section 564(b)(1) of the Act, 21 U.S.C. section 360bbb-3(b)(1), unless the authorization is terminated or revoked.  Performed at [facility], [address], [place], [place] [address]     Labs: CBC: Recent Labs  Lab 12/25/23 1546  12/26/23 1043  WBC 4.8 4.3  NEUTROABS 2.7  --   HGB 12.0 12.7  HCT 36.6 37.8  MCV 92.2 91.3  PLT 215 210   Basic Metabolic Panel: Recent Labs  Lab 12/25/23 1546 12/25/23 1901 12/26/23 1043  NA 142  --  138  K 3.8  --  3.4*  CL 106  --  104  CO2 27  --  23  GLUCOSE 98  --  147*  BUN 21  --  18  CREATININE 0.90  --  1.00  CALCIUM 10.1  --  9.9  MG  --  2.2  --   PHOS  --  3.9  --    Liver Function Tests: Recent Labs  Lab 12/25/23 1546 12/26/23 1043  AST 17 24  ALT 14 22  ALKPHOS 45 41  BILITOT 0.4 0.7  PROT 7.2 7.0  ALBUMIN 4.5 4.3   CBG: No results for input(s): "GLUCAP" in the last 168 hours.  Discharge time spent: greater than 30 minutes.  This record has been created using Conservation officer, historic buildings. Errors have been sought and corrected,but may not always be located. Such creation errors do not reflect on the standard of care.   Signed: Luna Salinas, MD [facility] 12/28/2023

## 2023-12-28 NOTE — TOC Progression Note (Signed)
 Transition of Care [facility]) - Progression Note    Patient Details  Name: [name] MRN: [id number] Date of Birth: [birth year]  Transition of Care [facility]) CM/SW Contact  Kooper Godshall A [place], LCSW Phone Number: 12/28/2023, 9:39 AM  Clinical Narrative:     Pt's authorization approved for insurance.   Auth ID: [id number] Reference ID: [id number]  Approval Dates:  12/28/2023-12/30/2023  Provider and facility notified, estimated DC today.   Expected Discharge Plan: Skilled Nursing Facility Barriers to Discharge: Continued Medical Work up  Expected Discharge Plan and Services In-house Referral: Clinical Social Work     Living arrangements for the past 2 months: Independent Living Facility ([facility])                                       Social Determinants of Health (SDOH) Interventions SDOH Screenings   Food Insecurity: No Food Insecurity (12/25/2023)  Housing: Low Risk  (12/25/2023)  Transportation Needs: No Transportation Needs (12/25/2023)  Utilities: Not At Risk (12/25/2023)  Alcohol Screen: Low Risk  (08/19/2023)  Depression (PHQ2-9): Low Risk  (08/19/2023)  Financial Resource Strain: Low Risk  (08/19/2023)  Physical Activity: Inactive (08/19/2023)  Social Connections: Moderately Integrated (12/25/2023)  Stress: No Stress Concern Present (08/19/2023)  Tobacco Use: Low Risk  (12/25/2023)  Health Literacy: Adequate Health Literacy (08/19/2023)    Readmission Risk Interventions     No data to display

## 2023-12-28 NOTE — TOC Transition Note (Addendum)
 Transition of Care [facility]) - Discharge Note   Patient Details  Name: [name] MRN: [id number] Date of Birth: Aug 15, 1936  Transition of Care [facility]) CM/SW Contact:  Kelli Joswick A [place], LCSW Phone Number: 12/28/2023, 2:08 PM   Clinical Narrative:     Patient will DC to: [facility]  Anticipated DC date: 12/28/23  Family notified: [name]  Transport by: Family/private vehicle   Auth ID: [id number]  Reference ID: [id number]   Approval Dates:   12/28/2023-12/30/2023    Per MD patient ready for DC to [facility] . RN, patient, patient's family, and facility notified of DC. Discharge Summary and FL2 sent to facility. RN to call report prior to discharge ( Room 608, [phone]). DC packet on chart. Pt's daughter providing transportation to facility, pick up time around 3pm.    CSW will sign off for now as social work intervention is no longer needed. Please consult us  again if new needs arise.   Final next level of care: Skilled Nursing Facility Barriers to Discharge: Barriers Resolved   Patient Goals and CMS Choice     Choice offered to / list presented to : Adult Children (daughter [name])      Discharge Placement              Patient chooses bed at: [facility] Patient to be transferred to facility by: Private vehicle, partnered transport Name of family member notified: [name] Patient and family notified of of transfer: 12/28/23  Discharge Plan and Services Additional resources added to the After Visit Summary for   In-house Referral: Clinical Social Work                                   Social Drivers of Health (SDOH) Interventions SDOH Screenings   Food Insecurity: No Food Insecurity (12/25/2023)  Housing: Low Risk  (12/25/2023)  Transportation Needs: No Transportation Needs (12/25/2023)  Utilities: Not At Risk (12/25/2023)  Alcohol Screen: Low Risk  (08/19/2023)  Depression (PHQ2-9): Low Risk  (08/19/2023)  Financial Resource Strain: Low Risk   (08/19/2023)  Physical Activity: Inactive (08/19/2023)  Social Connections: Moderately Integrated (12/25/2023)  Stress: No Stress Concern Present (08/19/2023)  Tobacco Use: Low Risk  (12/25/2023)  Health Literacy: Adequate Health Literacy (08/19/2023)     Readmission Risk Interventions     No data to display

## 2023-12-28 NOTE — Progress Notes (Signed)
 Physical Therapy Treatment Patient Details Name: [name] MRN: [id number] DOB: 1936/05/25 Today's Date: 12/28/2023   History of Present Illness Pt is an 88 y.o. female presenting 4/12 with lower back pain 2 weeks and dizziness. Recent fall in March. CT lumbar spine with no acute fracture, CXR unremarkable, CTH/MRI brain with no acute abnormality. EKG with sinus rhythm with multiple premature ventricular complex. PMH includes chronic systolic and diastolic heart failure, HLD, HTN and dementia.    PT Comments  Pt received in supine, pleasantly agreeable to therapy session and with good participation and tolerance for transfer and gait training for short household distances x2. Initial gait distance limited as pt requesting to use bathroom, then food had arrived and pt agreeable to sit up in chair for lunch while awaiting family to arrive, chair alarm on for safety. Pt needing some assist to set up her lunch tray and mod cues for forward gaze/upright posture, transfer hand placement and RW proximity during session. Pt needing up to modA to stand from lower toilet height surface with L wall rail. Patient will benefit from continued inpatient follow up therapy, <3 hours/day, as she needs significant lift assist still and at baseline is modI for transfers with 4WW, she is making progress toward her goals.   Orthostatic Lying   BP- Lying 138/70  Pulse- Lying 84  Orthostatic Standing at 0 minutes  BP- Standing at 0 minutes 129/77 (no c/o dizziness today)     If plan is discharge home, recommend the following: A little help with bathing/dressing/bathroom;Direct supervision/assist for medications management;Direct supervision/assist for financial management;Assist for transportation;Help with stairs or ramp for entrance;Supervision due to cognitive status;A lot of help with walking and/or transfers   Can travel by private vehicle     Yes  Equipment Recommendations  None recommended by PT     Recommendations for Other Services       Precautions / Restrictions Precautions Precautions: Fall Recall of Precautions/Restrictions: Impaired Restrictions Weight Bearing Restrictions Per Provider Order: No     Mobility  Bed Mobility Overal bed mobility: Needs Assistance Bed Mobility: Supine to Sit     Supine to sit: Contact guard     General bed mobility comments: from flat bed without rails, pt needing increased time to initiate and increased effort to perform    Transfers Overall transfer level: Needs assistance Equipment used: Rolling walker (2 wheels) Transfers: Sit to/from Stand Sit to Stand: Min assist, Mod assist           General transfer comment: heavy minA from lowest bed height after heavy cues for improved body mechanics (pt tending to lean backward initially). from toilet height, modA for sit>stand using L wall rail and cues to push from her knee or toilet seat with RUE as pt tending to pull on RW handle and lean backward. x2 trials to achieve upright from each surface.    Ambulation/Gait Ambulation/Gait assistance: Contact guard assist Gait Distance (Feet): 20 Feet (4ft to bathroom, then 62ft) Assistive device: Rolling walker (2 wheels)             Stairs             Wheelchair Mobility     Tilt Bed    Modified Rankin (Stroke Patients Only)       Balance Overall balance assessment: Needs assistance Sitting-balance support: No upper extremity supported, Feet supported Sitting balance-Leahy Scale: Good     Standing balance support: Bilateral upper extremity supported, Reliant on assistive device for  balance, No upper extremity supported Standing balance-Leahy Scale: Poor Standing balance comment: CGA for unsupported static standing (peri-care), minA for unsupported dynamic standing                            Communication Communication Communication: No apparent difficulties;Impaired Factors Affecting  Communication: Hearing impaired  Cognition Arousal: Alert Behavior During Therapy: WFL for tasks assessed/performed, Flat affect   PT - Cognitive impairments: History of cognitive impairments, Memory, Problem solving, Safety/Judgement                       PT - Cognition Comments: Dense multimodal cues to prepare for and perform transfers; poor carryover of some cues, benefits from multimodal cues for unfamiliar tasks. Following commands: Impaired Following commands impaired: Follows one step commands inconsistently, Follows one step commands with increased time (multimodal cues needed)    Cueing Cueing Techniques: Verbal cues, Visual cues, Tactile cues  Exercises      General Comments General comments (skin integrity, edema, etc.): Pt assisted to don hospital briefs with pad after toileting as she does not have purewick in currently; pt is continent and was able to indicate need for toileting during session; BP stable from sit>stand, see above; no c/o dizziness today. Gait belt brought to pt's room for home use as per chart review plan for pt DC to SNF with family transport assist today.      Pertinent Vitals/Pain Pain Assessment Pain Assessment: No/denies pain    Home Living                          Prior Function            PT Goals (current goals can now be found in the care plan section) Acute Rehab PT Goals Patient Stated Goal: To go home soon. PT Goal Formulation: With patient Time For Goal Achievement: 01/09/24 Progress towards PT goals: Progressing toward goals    Frequency    Min 2X/week      PT Plan      Co-evaluation              AM-PAC PT "6 Clicks" Mobility   Outcome Measure  Help needed turning from your back to your side while in a flat bed without using bedrails?: A Little Help needed moving from lying on your back to sitting on the side of a flat bed without using bedrails?: A Little Help needed moving to and from a bed  to a chair (including a wheelchair)?: A Little Help needed standing up from a chair using your arms (e.g., wheelchair or bedside chair)?: A Lot Help needed to walk in hospital room?: A Little Help needed climbing 3-5 steps with a railing? : Total 6 Click Score: 15    End of Session Equipment Utilized During Treatment: Gait belt Activity Tolerance: Patient tolerated treatment well Patient left: in chair;with call bell/phone within reach;with chair alarm set;Other (comment) (PTA assisted pt to set up her lunch tray) Nurse Communication: Mobility status PT Visit Diagnosis: Other abnormalities of gait and mobility (R26.89);History of falling (Z91.81);Muscle weakness (generalized) (M62.81)     Time: 1610-9604 PT Time Calculation (min) (ACUTE ONLY): 27 min  Charges:    $Gait Training: 8-22 mins $Therapeutic Activity: 8-22 mins PT General Charges $$ ACUTE PT VISIT: 1 Visit  Llana Rile., PTA Acute Rehabilitation Services Secure Chat Preferred 9a-5:30pm Office: [phone]    Mariel Shope [facility] 12/28/2023, 2:27 PM

## 2023-12-30 DIAGNOSIS — F0390 Unspecified dementia without behavioral disturbance: Secondary | ICD-10-CM

## 2023-12-30 DIAGNOSIS — I5022 Chronic systolic (congestive) heart failure: Secondary | ICD-10-CM | POA: Diagnosis not present

## 2023-12-30 DIAGNOSIS — I10 Essential (primary) hypertension: Secondary | ICD-10-CM | POA: Diagnosis not present

## 2023-12-30 DIAGNOSIS — R42 Dizziness and giddiness: Secondary | ICD-10-CM | POA: Diagnosis not present

## 2023-12-30 DIAGNOSIS — E559 Vitamin D deficiency, unspecified: Secondary | ICD-10-CM | POA: Diagnosis not present

## 2023-12-30 DIAGNOSIS — E785 Hyperlipidemia, unspecified: Secondary | ICD-10-CM | POA: Diagnosis not present

## 2024-01-04 ENCOUNTER — Other Ambulatory Visit: Payer: Self-pay | Admitting: Internal Medicine

## 2024-01-10 ENCOUNTER — Telehealth: Payer: Self-pay | Admitting: Internal Medicine

## 2024-01-10 ENCOUNTER — Telehealth: Payer: Self-pay

## 2024-01-10 NOTE — Transitions of Care (Post Inpatient/ED Visit) (Signed)
   01/10/2024  Name: [name] MRN: [id number] DOB: 09-07-1936  Today's TOC FU Call Status: Today's TOC FU Call Status:: Successful TOC FU Call Completed TOC FU Call Complete Date: 01/10/24 Patient's Name and Date of Birth confirmed.  Transition Care Management Follow-up Telephone Call Date of Discharge: 01/09/24 Discharge Facility: Other [facility]) Name of Other ([facility]) Discharge Facility: [facility] Type of Discharge: Inpatient Admission Primary Inpatient Discharge Diagnosis:: CHF How have you been since you were released from the hospital?: Better Any questions or concerns?: Yes Patient Questions/Concerns:: patient's meds have been changed, but she has not started them. Daughter wants Dr Neomi Banks to approve first. She states will drop by med list today for his review Patient Questions/Concerns Addressed: Notified Provider of Patient Questions/Concerns  Items Reviewed: Did you receive and understand the discharge instructions provided?: Yes Medications obtained,verified, and reconciled?: Yes (Medications Reviewed) Any new allergies since your discharge?: No Dietary orders reviewed?: Yes Do you have support at home?: Yes People in Home [RPT]: child(ren), adult  Medications Reviewed Today: Medications Reviewed Today   Medications were not reviewed in this encounter     Home Care and Equipment/Supplies: Were Home Health Services Ordered?: Yes Name of Home Health Agency:: [facility] Has Agency set up a time to come to your home?: No Any new equipment or medical supplies ordered?: NA  Functional Questionnaire: Do you need assistance with bathing/showering or dressing?: No Do you need assistance with meal preparation?: No Do you need assistance with eating?: No Do you have difficulty maintaining continence: No Do you need assistance with getting out of bed/getting out of a chair/moving?: No Do you have difficulty managing or taking your medications?: Yes  Follow up  appointments reviewed: PCP Follow-up appointment confirmed?: Yes Date of PCP follow-up appointment?: 01/14/24 Follow-up Provider: [facility] Follow-up appointment confirmed?: NA Do you need transportation to your follow-up appointment?: No Do you understand care options if your condition(s) worsen?: Yes-patient verbalized understanding    SIGNATURE Darrall Ellison, LPN [facility] Nurse Health Advisor Direct Dial [phone]

## 2024-01-10 NOTE — Telephone Encounter (Signed)
 Pt's family member (daughter) dropped off copies of record wanting provider to see before discussing during appt on 01-14-2024 HFU with PCP.  Document put at front office tray under providers name.

## 2024-01-10 NOTE — Telephone Encounter (Signed)
 Discharge paperwork from [facility] received, placed in PCP yellow folder for review.

## 2024-01-11 DIAGNOSIS — Z604 Social exclusion and rejection: Secondary | ICD-10-CM | POA: Diagnosis not present

## 2024-01-11 DIAGNOSIS — I110 Hypertensive heart disease with heart failure: Secondary | ICD-10-CM | POA: Diagnosis not present

## 2024-01-11 DIAGNOSIS — M47816 Spondylosis without myelopathy or radiculopathy, lumbar region: Secondary | ICD-10-CM | POA: Diagnosis not present

## 2024-01-11 DIAGNOSIS — E785 Hyperlipidemia, unspecified: Secondary | ICD-10-CM | POA: Diagnosis not present

## 2024-01-11 DIAGNOSIS — I5042 Chronic combined systolic (congestive) and diastolic (congestive) heart failure: Secondary | ICD-10-CM | POA: Diagnosis not present

## 2024-01-11 DIAGNOSIS — Z556 Problems related to health literacy: Secondary | ICD-10-CM | POA: Diagnosis not present

## 2024-01-11 DIAGNOSIS — I350 Nonrheumatic aortic (valve) stenosis: Secondary | ICD-10-CM | POA: Diagnosis not present

## 2024-01-11 DIAGNOSIS — I5022 Chronic systolic (congestive) heart failure: Secondary | ICD-10-CM | POA: Diagnosis not present

## 2024-01-11 DIAGNOSIS — Z79891 Long term (current) use of opiate analgesic: Secondary | ICD-10-CM | POA: Diagnosis not present

## 2024-01-11 DIAGNOSIS — K219 Gastro-esophageal reflux disease without esophagitis: Secondary | ICD-10-CM | POA: Diagnosis not present

## 2024-01-11 DIAGNOSIS — E559 Vitamin D deficiency, unspecified: Secondary | ICD-10-CM | POA: Diagnosis not present

## 2024-01-11 DIAGNOSIS — M1990 Unspecified osteoarthritis, unspecified site: Secondary | ICD-10-CM | POA: Diagnosis not present

## 2024-01-12 ENCOUNTER — Other Ambulatory Visit: Payer: Self-pay | Admitting: Internal Medicine

## 2024-01-12 DIAGNOSIS — I350 Nonrheumatic aortic (valve) stenosis: Secondary | ICD-10-CM | POA: Diagnosis not present

## 2024-01-12 DIAGNOSIS — M1990 Unspecified osteoarthritis, unspecified site: Secondary | ICD-10-CM | POA: Diagnosis not present

## 2024-01-12 DIAGNOSIS — Z604 Social exclusion and rejection: Secondary | ICD-10-CM | POA: Diagnosis not present

## 2024-01-12 DIAGNOSIS — E559 Vitamin D deficiency, unspecified: Secondary | ICD-10-CM | POA: Diagnosis not present

## 2024-01-12 DIAGNOSIS — Z79891 Long term (current) use of opiate analgesic: Secondary | ICD-10-CM | POA: Diagnosis not present

## 2024-01-12 DIAGNOSIS — I5022 Chronic systolic (congestive) heart failure: Secondary | ICD-10-CM | POA: Diagnosis not present

## 2024-01-12 DIAGNOSIS — E785 Hyperlipidemia, unspecified: Secondary | ICD-10-CM | POA: Diagnosis not present

## 2024-01-12 DIAGNOSIS — M47816 Spondylosis without myelopathy or radiculopathy, lumbar region: Secondary | ICD-10-CM | POA: Diagnosis not present

## 2024-01-12 DIAGNOSIS — Z556 Problems related to health literacy: Secondary | ICD-10-CM | POA: Diagnosis not present

## 2024-01-12 DIAGNOSIS — K219 Gastro-esophageal reflux disease without esophagitis: Secondary | ICD-10-CM | POA: Diagnosis not present

## 2024-01-12 DIAGNOSIS — I5042 Chronic combined systolic (congestive) and diastolic (congestive) heart failure: Secondary | ICD-10-CM | POA: Diagnosis not present

## 2024-01-12 DIAGNOSIS — I110 Hypertensive heart disease with heart failure: Secondary | ICD-10-CM | POA: Diagnosis not present

## 2024-01-12 NOTE — Telephone Encounter (Signed)
 After admission to hospital was released to [facility] health facility. Was discharged home with home PT OT and SN for medication management They sent a medication list

## 2024-01-12 NOTE — Telephone Encounter (Signed)
Med list reviewed.

## 2024-01-13 ENCOUNTER — Telehealth: Payer: Self-pay | Admitting: Internal Medicine

## 2024-01-13 DIAGNOSIS — E559 Vitamin D deficiency, unspecified: Secondary | ICD-10-CM | POA: Diagnosis not present

## 2024-01-13 DIAGNOSIS — Z556 Problems related to health literacy: Secondary | ICD-10-CM | POA: Diagnosis not present

## 2024-01-13 DIAGNOSIS — E785 Hyperlipidemia, unspecified: Secondary | ICD-10-CM | POA: Diagnosis not present

## 2024-01-13 DIAGNOSIS — Z604 Social exclusion and rejection: Secondary | ICD-10-CM | POA: Diagnosis not present

## 2024-01-13 DIAGNOSIS — I5022 Chronic systolic (congestive) heart failure: Secondary | ICD-10-CM | POA: Diagnosis not present

## 2024-01-13 DIAGNOSIS — M1990 Unspecified osteoarthritis, unspecified site: Secondary | ICD-10-CM | POA: Diagnosis not present

## 2024-01-13 DIAGNOSIS — I110 Hypertensive heart disease with heart failure: Secondary | ICD-10-CM | POA: Diagnosis not present

## 2024-01-13 DIAGNOSIS — M47816 Spondylosis without myelopathy or radiculopathy, lumbar region: Secondary | ICD-10-CM | POA: Diagnosis not present

## 2024-01-13 DIAGNOSIS — I5042 Chronic combined systolic (congestive) and diastolic (congestive) heart failure: Secondary | ICD-10-CM | POA: Diagnosis not present

## 2024-01-13 DIAGNOSIS — I350 Nonrheumatic aortic (valve) stenosis: Secondary | ICD-10-CM | POA: Diagnosis not present

## 2024-01-13 DIAGNOSIS — K219 Gastro-esophageal reflux disease without esophagitis: Secondary | ICD-10-CM | POA: Diagnosis not present

## 2024-01-13 DIAGNOSIS — Z79891 Long term (current) use of opiate analgesic: Secondary | ICD-10-CM | POA: Diagnosis not present

## 2024-01-13 NOTE — Telephone Encounter (Signed)
 Copied from CRM [id number]. Topic: Clinical - Home Health Verbal Orders >> Jan 13, 2024  3:04 PM Lizabeth Riggs wrote: Caller/Agency: Loetta Ringer with [facility] - She has a secured voicemail to leave a message.  Callback Number: [phone] Service Requested: Physical Therapy Frequency: 1 time weekly for 9 weeks Any new concerns about the patient? No

## 2024-01-14 ENCOUNTER — Encounter: Payer: Self-pay | Admitting: Internal Medicine

## 2024-01-14 ENCOUNTER — Telehealth (INDEPENDENT_AMBULATORY_CARE_PROVIDER_SITE_OTHER): Admitting: Internal Medicine

## 2024-01-14 VITALS — Ht 67.0 in

## 2024-01-14 DIAGNOSIS — M810 Age-related osteoporosis without current pathological fracture: Secondary | ICD-10-CM

## 2024-01-14 DIAGNOSIS — F22 Delusional disorders: Secondary | ICD-10-CM

## 2024-01-14 DIAGNOSIS — F0390 Unspecified dementia without behavioral disturbance: Secondary | ICD-10-CM

## 2024-01-14 DIAGNOSIS — Z789 Other specified health status: Secondary | ICD-10-CM

## 2024-01-14 DIAGNOSIS — F32A Depression, unspecified: Secondary | ICD-10-CM

## 2024-01-14 MED ORDER — VENLAFAXINE HCL ER 75 MG PO CP24: 75.0000 mg | ORAL_CAPSULE | Freq: Every day | ORAL | 0 refills | Status: DC

## 2024-01-14 NOTE — Telephone Encounter (Signed)
 Per PCP- add nursing orders, needs BMP and CBC w/ diff.    I LMOM for Loetta Ringer asking for nursing orders, and add on for labs.

## 2024-01-14 NOTE — Progress Notes (Unsigned)
 Subjective:    Patient ID: [name], female    DOB: 1936/04/27, 88 y.o.   MRN: [id number]  DOS:  01/14/2024 Type of visit - description: Virtual Visit via Video Note  I connected with the above patient  by a video enabled telemedicine application and verified that I am speaking with the correct person using two identifiers.   Location of patient: home  Location of provider: office  Persons participating in the virtual visit: patient, provider   I discussed the limitations of evaluation and management by telemedicine and the availability of in person appointments. The patient expressed understanding and agreed to proceed.  HPI:        I discussed the assessment and treatment plan with the patient. The patient was provided an opportunity to ask questions and all were answered. The patient agreed with the plan and demonstrated an understanding of the instructions.   The patient was advised to call back or seek an in-person evaluation if the symptoms worsen or if the condition fails to improve as anticipated.   NO NEED FOR TIME ID DOCUMENTATION SUPPORTS A LEVEL III OR IV OK TO USE TEMPLATE AS BELOW IF NEEDED   Today, I spent more than    min with the patient: >50% of the time counseling regards  Also: -reviewing the chart and labs ordered by other providers  -coordinating his care    Admitted 4/12, discharged 12/28/2023. Admitted chief complaint of dizziness, had a mechanical fall PTA, BP was slightly elevated upon admission.  CT head and brain MRI no acute Chest x-ray unremarkable CT lumbar spine with no acute findings Had a left adrenal gland lesion, indeterminate.  Likely benign statistically. Recheck CT in 4 to 6 months.  She was discharged with the instructions of good hydration and a follow-up with CBC and BMP.  Review of Systems I spoke with and the patient's daughter. [name] is currently at independent living. To get PT, OT, nurse after the review visiting  her. BP was check this week by the nurse and it was okay. No additional falls. Dementia is getting worse, she does not recall she was in the hospital, some paranoia ideation for instance she thought that were there were intruders in her apartment.   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   Depression    GERD (gastroesophageal reflux disease)    History of gastroesophageal reflux (GERD)    Hx of adenomatous colonic polyps    Hx of cardiac cath 2006   neg   Hypertension    Mixed urge and stress incontinence    OAB (overactive bladder) 07/13/2012   Osteoarthritis    Osteopenia    rx fosamax  08-2013   Personal history of colonic polyps - adenomas 04/19/2014    Past Surgical History:  Procedure Laterality Date   ABDOMINAL HYSTERECTOMY  1971   APPENDECTOMY  1971   (at time of hyst?)   BLADDER SURGERY  2003   CATARACT EXTRACTION, BILATERAL     CHOLECYSTECTOMY     OOPHORECTOMY  1971   ROTATOR CUFF REPAIR Right    TOE SURGERY  1995   R 2nd --- correction of claw toe     Current Outpatient Medications  Medication Instructions  acetaminophen  (TYLENOL ) 650 mg, 2 times daily   cholecalciferol (VITAMIN D3) 1,000 Units, 2 times daily   lidocaine  (LIDODERM ) 5 % 1 patch, Transdermal, Every 24 hours, Remove & Discard patch within 12 hours or as directed by MD   losartan  (COZAAR ) 25 mg, Oral, Daily   meclizine  (ANTIVERT ) 12.5 mg, Oral, 2 times daily PRN   memantine  (NAMENDA ) 5 mg, Oral, 2 times daily   omeprazole  (PRILOSEC) 20 mg, Oral, 2 times daily before meals   polyethylene glycol (MIRALAX  / GLYCOLAX ) 17 g, Oral, Daily PRN   simvastatin  (ZOCOR ) 20 mg, Oral, Daily   venlafaxine  XR (EFFEXOR  XR) 75 mg, Oral, Daily with breakfast       Objective:    Physical Exam Ht 5\' 7"  (1.702 m)   BMI 28.69 kg/m      Assessment     Assessment HTN Hyperlipidemia  GERD Depression Dementia: -sxs started shortly after she lost her husband.  MMSE 25 - DX 02/2020: MRI brain no acute, labs negative, Rx Namenda  Osteoporosis:  T score plus ankle fracture 03-2019: t score 2008 -1.8, 2011 -1.8, 2014 -2.1: Fosamax  Rx 08-2013 t score 03-2016: -2.1.  10/2020 Tscore  -2.7: Switch from Fosamax  to Prolia  03-2023 H/o vit d def  DJD- Aortic stenosis ( at some point diagnosed with HOCM) ---echo 01-2010: "Mild AS but no evidence of HCM or MR.  ---Echo 6-15 , 05-2019 :stable mild AoS. Overactive bladder- pcp started to rx meds 09-2016  Ao US : wnl 02-2015 [facility]   PLAN: Hospital follow-up: Dizziness, fall: After acute care was sent to a rehab facility for 2 weeks. Then was discharged home to independent living at [facility]. Due to dementia, she needs assisted living however she does not qualify currently for that service according to the daughter. Will consult palliative care, if needed will get a Child psychotherapist. Failure to thrive: See above Dementia: Getting worse, continue Namenda  for now HTN: Not on losartan  currently, BP check 1 time this week was okay according to the daughter.  Plan: BMP and CBC when possible, continue checking BPs when possible and let me know, detailed instructions will be sent to the patient's daughter via message. Depression: Refill Effexor    It was nice to talk to you. This is the plan: Will ask PT OT to check BP at every opportunity and send us  the readings.  Goal is between 120-135/70-85. Will ask them to draw blood work. We are refilling Effexor  We are referring you to palliative care, hopefully they will help you manage the care of your mom.  If you do not hear from them, I could refer you to a Child psychotherapist.

## 2024-01-14 NOTE — Telephone Encounter (Signed)
LMOM for [name] w/ verbal orders.  

## 2024-01-16 ENCOUNTER — Encounter: Payer: Self-pay | Admitting: Internal Medicine

## 2024-01-17 DIAGNOSIS — Z556 Problems related to health literacy: Secondary | ICD-10-CM | POA: Diagnosis not present

## 2024-01-17 DIAGNOSIS — E785 Hyperlipidemia, unspecified: Secondary | ICD-10-CM | POA: Diagnosis not present

## 2024-01-17 DIAGNOSIS — M47816 Spondylosis without myelopathy or radiculopathy, lumbar region: Secondary | ICD-10-CM | POA: Diagnosis not present

## 2024-01-17 DIAGNOSIS — I5042 Chronic combined systolic (congestive) and diastolic (congestive) heart failure: Secondary | ICD-10-CM | POA: Diagnosis not present

## 2024-01-17 DIAGNOSIS — I5022 Chronic systolic (congestive) heart failure: Secondary | ICD-10-CM | POA: Diagnosis not present

## 2024-01-17 DIAGNOSIS — M1990 Unspecified osteoarthritis, unspecified site: Secondary | ICD-10-CM | POA: Diagnosis not present

## 2024-01-17 DIAGNOSIS — Z604 Social exclusion and rejection: Secondary | ICD-10-CM | POA: Diagnosis not present

## 2024-01-17 DIAGNOSIS — I350 Nonrheumatic aortic (valve) stenosis: Secondary | ICD-10-CM | POA: Diagnosis not present

## 2024-01-17 DIAGNOSIS — Z79891 Long term (current) use of opiate analgesic: Secondary | ICD-10-CM | POA: Diagnosis not present

## 2024-01-17 DIAGNOSIS — I110 Hypertensive heart disease with heart failure: Secondary | ICD-10-CM | POA: Diagnosis not present

## 2024-01-17 DIAGNOSIS — E559 Vitamin D deficiency, unspecified: Secondary | ICD-10-CM | POA: Diagnosis not present

## 2024-01-17 DIAGNOSIS — K219 Gastro-esophageal reflux disease without esophagitis: Secondary | ICD-10-CM | POA: Diagnosis not present

## 2024-01-17 NOTE — Assessment & Plan Note (Signed)
 Hospital follow-up: Dizziness, fall: After acute care was sent to a rehab facility for 2 weeks. Then was discharged home to independent living at [facility]. Due to dementia, she needs assisted living however she does not qualify currently for that service according to the daughter. Will consult palliative care, if needed will get a Child psychotherapist. Failure to thrive: See above Dementia: Getting worse, continue Namenda  for now.  Observation of behavior issues. HTN: Not on losartan  currently, BP check 1 time this week was okay according to the daughter.  Plan: BMP and CBC when possible, continue checking BPs when possible and let me know, detailed instructions will be sent to the patient's daughter via message. Depression: Refill Effexor  Detailed message sent via MyChart

## 2024-01-18 ENCOUNTER — Telehealth: Payer: Self-pay

## 2024-01-18 NOTE — Telephone Encounter (Signed)
 Received call from [facility]- they received referral from Dr. Neomi Banks for Guide program, they wanted to inform that her insurance doesn't allow for Guide program but she is being accepted into their regular palliative care program. Just an FYI.

## 2024-01-19 DIAGNOSIS — Z604 Social exclusion and rejection: Secondary | ICD-10-CM | POA: Diagnosis not present

## 2024-01-19 DIAGNOSIS — I350 Nonrheumatic aortic (valve) stenosis: Secondary | ICD-10-CM | POA: Diagnosis not present

## 2024-01-19 DIAGNOSIS — K219 Gastro-esophageal reflux disease without esophagitis: Secondary | ICD-10-CM | POA: Diagnosis not present

## 2024-01-19 DIAGNOSIS — I110 Hypertensive heart disease with heart failure: Secondary | ICD-10-CM | POA: Diagnosis not present

## 2024-01-19 DIAGNOSIS — Z556 Problems related to health literacy: Secondary | ICD-10-CM | POA: Diagnosis not present

## 2024-01-19 DIAGNOSIS — M47816 Spondylosis without myelopathy or radiculopathy, lumbar region: Secondary | ICD-10-CM | POA: Diagnosis not present

## 2024-01-19 DIAGNOSIS — Z79891 Long term (current) use of opiate analgesic: Secondary | ICD-10-CM | POA: Diagnosis not present

## 2024-01-19 DIAGNOSIS — E785 Hyperlipidemia, unspecified: Secondary | ICD-10-CM | POA: Diagnosis not present

## 2024-01-19 DIAGNOSIS — I5042 Chronic combined systolic (congestive) and diastolic (congestive) heart failure: Secondary | ICD-10-CM | POA: Diagnosis not present

## 2024-01-19 DIAGNOSIS — E559 Vitamin D deficiency, unspecified: Secondary | ICD-10-CM | POA: Diagnosis not present

## 2024-01-19 DIAGNOSIS — I5022 Chronic systolic (congestive) heart failure: Secondary | ICD-10-CM | POA: Diagnosis not present

## 2024-01-19 DIAGNOSIS — M1990 Unspecified osteoarthritis, unspecified site: Secondary | ICD-10-CM | POA: Diagnosis not present

## 2024-01-21 ENCOUNTER — Telehealth: Payer: Self-pay | Admitting: Internal Medicine

## 2024-01-21 DIAGNOSIS — E785 Hyperlipidemia, unspecified: Secondary | ICD-10-CM | POA: Diagnosis not present

## 2024-01-21 DIAGNOSIS — Z556 Problems related to health literacy: Secondary | ICD-10-CM | POA: Diagnosis not present

## 2024-01-21 DIAGNOSIS — I110 Hypertensive heart disease with heart failure: Secondary | ICD-10-CM | POA: Diagnosis not present

## 2024-01-21 DIAGNOSIS — E559 Vitamin D deficiency, unspecified: Secondary | ICD-10-CM | POA: Diagnosis not present

## 2024-01-21 DIAGNOSIS — I5022 Chronic systolic (congestive) heart failure: Secondary | ICD-10-CM | POA: Diagnosis not present

## 2024-01-21 DIAGNOSIS — Z79891 Long term (current) use of opiate analgesic: Secondary | ICD-10-CM | POA: Diagnosis not present

## 2024-01-21 DIAGNOSIS — I350 Nonrheumatic aortic (valve) stenosis: Secondary | ICD-10-CM | POA: Diagnosis not present

## 2024-01-21 DIAGNOSIS — K219 Gastro-esophageal reflux disease without esophagitis: Secondary | ICD-10-CM | POA: Diagnosis not present

## 2024-01-21 DIAGNOSIS — M1990 Unspecified osteoarthritis, unspecified site: Secondary | ICD-10-CM | POA: Diagnosis not present

## 2024-01-21 DIAGNOSIS — M47816 Spondylosis without myelopathy or radiculopathy, lumbar region: Secondary | ICD-10-CM | POA: Diagnosis not present

## 2024-01-21 DIAGNOSIS — Z604 Social exclusion and rejection: Secondary | ICD-10-CM | POA: Diagnosis not present

## 2024-01-21 DIAGNOSIS — I5042 Chronic combined systolic (congestive) and diastolic (congestive) heart failure: Secondary | ICD-10-CM | POA: Diagnosis not present

## 2024-01-21 NOTE — Telephone Encounter (Signed)
 FL2 completed, sent mychart message to Pt's daughter ANN w/ several questions, awaiting response.

## 2024-01-21 NOTE — Telephone Encounter (Signed)
 Copied from CRM [id number]. Topic: General - Other >> Jan 21, 2024 11:18 AM Adaysia C wrote: Reason for CRM: Patients daughter(Charlotte) has requested a FL2 form from the PCP; please follow up with [facility] when the form is ready for pick up [phone]

## 2024-01-24 DIAGNOSIS — Z0279 Encounter for issue of other medical certificate: Secondary | ICD-10-CM

## 2024-01-24 NOTE — Telephone Encounter (Signed)
 Pt's daughter Lorelee Roger came by office to pick up FL2- further questions answered. Original FL2 form given to Lorelee Roger, copy sent for scanning.

## 2024-01-25 ENCOUNTER — Telehealth: Payer: Self-pay

## 2024-01-25 ENCOUNTER — Other Ambulatory Visit: Payer: Self-pay | Admitting: Internal Medicine

## 2024-01-25 DIAGNOSIS — E559 Vitamin D deficiency, unspecified: Secondary | ICD-10-CM | POA: Diagnosis not present

## 2024-01-25 DIAGNOSIS — I110 Hypertensive heart disease with heart failure: Secondary | ICD-10-CM | POA: Diagnosis not present

## 2024-01-25 DIAGNOSIS — Z556 Problems related to health literacy: Secondary | ICD-10-CM | POA: Diagnosis not present

## 2024-01-25 DIAGNOSIS — E785 Hyperlipidemia, unspecified: Secondary | ICD-10-CM | POA: Diagnosis not present

## 2024-01-25 DIAGNOSIS — Z604 Social exclusion and rejection: Secondary | ICD-10-CM | POA: Diagnosis not present

## 2024-01-25 DIAGNOSIS — I5022 Chronic systolic (congestive) heart failure: Secondary | ICD-10-CM | POA: Diagnosis not present

## 2024-01-25 DIAGNOSIS — M1990 Unspecified osteoarthritis, unspecified site: Secondary | ICD-10-CM | POA: Diagnosis not present

## 2024-01-25 DIAGNOSIS — K219 Gastro-esophageal reflux disease without esophagitis: Secondary | ICD-10-CM | POA: Diagnosis not present

## 2024-01-25 DIAGNOSIS — Z79891 Long term (current) use of opiate analgesic: Secondary | ICD-10-CM | POA: Diagnosis not present

## 2024-01-25 DIAGNOSIS — I5042 Chronic combined systolic (congestive) and diastolic (congestive) heart failure: Secondary | ICD-10-CM | POA: Diagnosis not present

## 2024-01-25 DIAGNOSIS — M47816 Spondylosis without myelopathy or radiculopathy, lumbar region: Secondary | ICD-10-CM | POA: Diagnosis not present

## 2024-01-25 DIAGNOSIS — I350 Nonrheumatic aortic (valve) stenosis: Secondary | ICD-10-CM | POA: Diagnosis not present

## 2024-01-25 NOTE — Telephone Encounter (Signed)
 Plan of care signed and faxed back to [facility] at [phone]. Form sent for scanning.

## 2024-01-26 DIAGNOSIS — E559 Vitamin D deficiency, unspecified: Secondary | ICD-10-CM | POA: Diagnosis not present

## 2024-01-26 DIAGNOSIS — I5042 Chronic combined systolic (congestive) and diastolic (congestive) heart failure: Secondary | ICD-10-CM | POA: Diagnosis not present

## 2024-01-26 DIAGNOSIS — I350 Nonrheumatic aortic (valve) stenosis: Secondary | ICD-10-CM | POA: Diagnosis not present

## 2024-01-26 DIAGNOSIS — I110 Hypertensive heart disease with heart failure: Secondary | ICD-10-CM | POA: Diagnosis not present

## 2024-01-26 DIAGNOSIS — M1990 Unspecified osteoarthritis, unspecified site: Secondary | ICD-10-CM | POA: Diagnosis not present

## 2024-01-26 DIAGNOSIS — M47816 Spondylosis without myelopathy or radiculopathy, lumbar region: Secondary | ICD-10-CM | POA: Diagnosis not present

## 2024-01-26 DIAGNOSIS — Z79891 Long term (current) use of opiate analgesic: Secondary | ICD-10-CM | POA: Diagnosis not present

## 2024-01-26 DIAGNOSIS — Z604 Social exclusion and rejection: Secondary | ICD-10-CM | POA: Diagnosis not present

## 2024-01-26 DIAGNOSIS — I5022 Chronic systolic (congestive) heart failure: Secondary | ICD-10-CM | POA: Diagnosis not present

## 2024-01-26 DIAGNOSIS — Z556 Problems related to health literacy: Secondary | ICD-10-CM | POA: Diagnosis not present

## 2024-01-26 DIAGNOSIS — E785 Hyperlipidemia, unspecified: Secondary | ICD-10-CM | POA: Diagnosis not present

## 2024-01-26 DIAGNOSIS — K219 Gastro-esophageal reflux disease without esophagitis: Secondary | ICD-10-CM | POA: Diagnosis not present

## 2024-01-28 DIAGNOSIS — I110 Hypertensive heart disease with heart failure: Secondary | ICD-10-CM | POA: Diagnosis not present

## 2024-01-28 DIAGNOSIS — Z556 Problems related to health literacy: Secondary | ICD-10-CM | POA: Diagnosis not present

## 2024-01-28 DIAGNOSIS — I5042 Chronic combined systolic (congestive) and diastolic (congestive) heart failure: Secondary | ICD-10-CM | POA: Diagnosis not present

## 2024-01-28 DIAGNOSIS — E559 Vitamin D deficiency, unspecified: Secondary | ICD-10-CM | POA: Diagnosis not present

## 2024-01-28 DIAGNOSIS — Z604 Social exclusion and rejection: Secondary | ICD-10-CM | POA: Diagnosis not present

## 2024-01-28 DIAGNOSIS — Z79891 Long term (current) use of opiate analgesic: Secondary | ICD-10-CM | POA: Diagnosis not present

## 2024-01-28 DIAGNOSIS — M1990 Unspecified osteoarthritis, unspecified site: Secondary | ICD-10-CM | POA: Diagnosis not present

## 2024-01-28 DIAGNOSIS — M47816 Spondylosis without myelopathy or radiculopathy, lumbar region: Secondary | ICD-10-CM | POA: Diagnosis not present

## 2024-01-28 DIAGNOSIS — E785 Hyperlipidemia, unspecified: Secondary | ICD-10-CM | POA: Diagnosis not present

## 2024-01-28 DIAGNOSIS — K219 Gastro-esophageal reflux disease without esophagitis: Secondary | ICD-10-CM | POA: Diagnosis not present

## 2024-01-28 DIAGNOSIS — I350 Nonrheumatic aortic (valve) stenosis: Secondary | ICD-10-CM | POA: Diagnosis not present

## 2024-01-28 DIAGNOSIS — I5022 Chronic systolic (congestive) heart failure: Secondary | ICD-10-CM | POA: Diagnosis not present

## 2024-01-31 NOTE — Telephone Encounter (Signed)
 Could you fix the referral for palliative care

## 2024-02-01 DIAGNOSIS — E559 Vitamin D deficiency, unspecified: Secondary | ICD-10-CM | POA: Diagnosis not present

## 2024-02-01 DIAGNOSIS — E785 Hyperlipidemia, unspecified: Secondary | ICD-10-CM | POA: Diagnosis not present

## 2024-02-01 DIAGNOSIS — M1990 Unspecified osteoarthritis, unspecified site: Secondary | ICD-10-CM | POA: Diagnosis not present

## 2024-02-01 DIAGNOSIS — I350 Nonrheumatic aortic (valve) stenosis: Secondary | ICD-10-CM | POA: Diagnosis not present

## 2024-02-01 DIAGNOSIS — M47816 Spondylosis without myelopathy or radiculopathy, lumbar region: Secondary | ICD-10-CM | POA: Diagnosis not present

## 2024-02-01 DIAGNOSIS — K219 Gastro-esophageal reflux disease without esophagitis: Secondary | ICD-10-CM | POA: Diagnosis not present

## 2024-02-01 DIAGNOSIS — I5042 Chronic combined systolic (congestive) and diastolic (congestive) heart failure: Secondary | ICD-10-CM | POA: Diagnosis not present

## 2024-02-01 DIAGNOSIS — Z604 Social exclusion and rejection: Secondary | ICD-10-CM | POA: Diagnosis not present

## 2024-02-01 DIAGNOSIS — Z79891 Long term (current) use of opiate analgesic: Secondary | ICD-10-CM | POA: Diagnosis not present

## 2024-02-01 DIAGNOSIS — Z556 Problems related to health literacy: Secondary | ICD-10-CM | POA: Diagnosis not present

## 2024-02-01 DIAGNOSIS — I110 Hypertensive heart disease with heart failure: Secondary | ICD-10-CM | POA: Diagnosis not present

## 2024-02-01 DIAGNOSIS — I5022 Chronic systolic (congestive) heart failure: Secondary | ICD-10-CM | POA: Diagnosis not present

## 2024-02-03 ENCOUNTER — Telehealth: Payer: Self-pay

## 2024-02-03 DIAGNOSIS — I5042 Chronic combined systolic (congestive) and diastolic (congestive) heart failure: Secondary | ICD-10-CM | POA: Diagnosis not present

## 2024-02-03 DIAGNOSIS — M1990 Unspecified osteoarthritis, unspecified site: Secondary | ICD-10-CM | POA: Diagnosis not present

## 2024-02-03 DIAGNOSIS — I110 Hypertensive heart disease with heart failure: Secondary | ICD-10-CM | POA: Diagnosis not present

## 2024-02-03 DIAGNOSIS — Z604 Social exclusion and rejection: Secondary | ICD-10-CM | POA: Diagnosis not present

## 2024-02-03 DIAGNOSIS — Z79891 Long term (current) use of opiate analgesic: Secondary | ICD-10-CM | POA: Diagnosis not present

## 2024-02-03 DIAGNOSIS — M47816 Spondylosis without myelopathy or radiculopathy, lumbar region: Secondary | ICD-10-CM | POA: Diagnosis not present

## 2024-02-03 DIAGNOSIS — E559 Vitamin D deficiency, unspecified: Secondary | ICD-10-CM | POA: Diagnosis not present

## 2024-02-03 DIAGNOSIS — I5022 Chronic systolic (congestive) heart failure: Secondary | ICD-10-CM | POA: Diagnosis not present

## 2024-02-03 DIAGNOSIS — Z556 Problems related to health literacy: Secondary | ICD-10-CM | POA: Diagnosis not present

## 2024-02-03 DIAGNOSIS — K219 Gastro-esophageal reflux disease without esophagitis: Secondary | ICD-10-CM | POA: Diagnosis not present

## 2024-02-03 DIAGNOSIS — I350 Nonrheumatic aortic (valve) stenosis: Secondary | ICD-10-CM | POA: Diagnosis not present

## 2024-02-03 DIAGNOSIS — E785 Hyperlipidemia, unspecified: Secondary | ICD-10-CM | POA: Diagnosis not present

## 2024-02-03 NOTE — Telephone Encounter (Signed)
 Copied from CRM [id number]. Topic: General - Other >> Feb 03, 2024  1:24 PM Abigail D wrote: Reason for CRM: Patients daughter called because she said that the [facility] form that was filled out was the incorrect type, she said that her mother needs a "[facility] Long-term care FL2" as she is trying to get her into a nursing home. If someone could give her a call back when the form is ready for pick up. [phone]

## 2024-02-03 NOTE — Telephone Encounter (Signed)
 Spoke w/ Lorelee Roger- she will drop off new FL2 form tomorrow.

## 2024-02-04 ENCOUNTER — Encounter: Payer: Self-pay | Admitting: Internal Medicine

## 2024-02-04 ENCOUNTER — Telehealth: Payer: Self-pay | Admitting: Internal Medicine

## 2024-02-04 DIAGNOSIS — Z0279 Encounter for issue of other medical certificate: Secondary | ICD-10-CM

## 2024-02-04 NOTE — Telephone Encounter (Signed)
 Duplicate message.

## 2024-02-04 NOTE — Telephone Encounter (Signed)
 New FL2 form completed, placed in PCP red folder for PCP to sign.

## 2024-02-04 NOTE — Telephone Encounter (Signed)
 Copied from CRM [id number]. Topic: General - Other >> Feb 04, 2024  2:32 PM Star East wrote: Reason for CRM: call from Narda Bacon calling about FL2 form needed, wrong one was filled out, she is sending the correct form to be filled out and signed.

## 2024-02-04 NOTE — Telephone Encounter (Signed)
Completed form placed at front desk for pick up, copy sent for scanning

## 2024-02-04 NOTE — Telephone Encounter (Signed)
 Aubrey Blas B routed conversation to [employer] minutes ago (2:47 PM)   Aubrey Blas B6 minutes ago (2:47 PM)   [facility] Copied from [employer] [id number]. Topic: General - Other >> Feb 04, 2024  2:32 PM Star East wrote: Reason for CRM: call from Narda Bacon calling about FL2 form needed, wrong one was filled out, she is sending the correct form to be filled out and signed.

## 2024-02-05 DIAGNOSIS — K219 Gastro-esophageal reflux disease without esophagitis: Secondary | ICD-10-CM | POA: Diagnosis not present

## 2024-02-05 DIAGNOSIS — I110 Hypertensive heart disease with heart failure: Secondary | ICD-10-CM | POA: Diagnosis not present

## 2024-02-05 DIAGNOSIS — Z556 Problems related to health literacy: Secondary | ICD-10-CM | POA: Diagnosis not present

## 2024-02-05 DIAGNOSIS — I350 Nonrheumatic aortic (valve) stenosis: Secondary | ICD-10-CM | POA: Diagnosis not present

## 2024-02-05 DIAGNOSIS — Z604 Social exclusion and rejection: Secondary | ICD-10-CM | POA: Diagnosis not present

## 2024-02-05 DIAGNOSIS — E559 Vitamin D deficiency, unspecified: Secondary | ICD-10-CM | POA: Diagnosis not present

## 2024-02-05 DIAGNOSIS — M47816 Spondylosis without myelopathy or radiculopathy, lumbar region: Secondary | ICD-10-CM | POA: Diagnosis not present

## 2024-02-05 DIAGNOSIS — E785 Hyperlipidemia, unspecified: Secondary | ICD-10-CM | POA: Diagnosis not present

## 2024-02-05 DIAGNOSIS — Z79891 Long term (current) use of opiate analgesic: Secondary | ICD-10-CM | POA: Diagnosis not present

## 2024-02-05 DIAGNOSIS — I5042 Chronic combined systolic (congestive) and diastolic (congestive) heart failure: Secondary | ICD-10-CM | POA: Diagnosis not present

## 2024-02-05 DIAGNOSIS — I5022 Chronic systolic (congestive) heart failure: Secondary | ICD-10-CM | POA: Diagnosis not present

## 2024-02-05 DIAGNOSIS — M1990 Unspecified osteoarthritis, unspecified site: Secondary | ICD-10-CM | POA: Diagnosis not present

## 2024-02-08 DIAGNOSIS — I110 Hypertensive heart disease with heart failure: Secondary | ICD-10-CM | POA: Diagnosis not present

## 2024-02-08 DIAGNOSIS — K219 Gastro-esophageal reflux disease without esophagitis: Secondary | ICD-10-CM | POA: Diagnosis not present

## 2024-02-08 DIAGNOSIS — Z556 Problems related to health literacy: Secondary | ICD-10-CM | POA: Diagnosis not present

## 2024-02-08 DIAGNOSIS — I5022 Chronic systolic (congestive) heart failure: Secondary | ICD-10-CM | POA: Diagnosis not present

## 2024-02-08 DIAGNOSIS — Z79891 Long term (current) use of opiate analgesic: Secondary | ICD-10-CM | POA: Diagnosis not present

## 2024-02-08 DIAGNOSIS — M47816 Spondylosis without myelopathy or radiculopathy, lumbar region: Secondary | ICD-10-CM | POA: Diagnosis not present

## 2024-02-08 DIAGNOSIS — Z604 Social exclusion and rejection: Secondary | ICD-10-CM | POA: Diagnosis not present

## 2024-02-08 DIAGNOSIS — I5042 Chronic combined systolic (congestive) and diastolic (congestive) heart failure: Secondary | ICD-10-CM | POA: Diagnosis not present

## 2024-02-08 DIAGNOSIS — M1990 Unspecified osteoarthritis, unspecified site: Secondary | ICD-10-CM | POA: Diagnosis not present

## 2024-02-08 DIAGNOSIS — E559 Vitamin D deficiency, unspecified: Secondary | ICD-10-CM | POA: Diagnosis not present

## 2024-02-08 DIAGNOSIS — E785 Hyperlipidemia, unspecified: Secondary | ICD-10-CM | POA: Diagnosis not present

## 2024-02-08 DIAGNOSIS — I350 Nonrheumatic aortic (valve) stenosis: Secondary | ICD-10-CM | POA: Diagnosis not present

## 2024-02-11 DIAGNOSIS — I110 Hypertensive heart disease with heart failure: Secondary | ICD-10-CM | POA: Diagnosis not present

## 2024-02-11 DIAGNOSIS — I5042 Chronic combined systolic (congestive) and diastolic (congestive) heart failure: Secondary | ICD-10-CM | POA: Diagnosis not present

## 2024-02-11 DIAGNOSIS — M1990 Unspecified osteoarthritis, unspecified site: Secondary | ICD-10-CM | POA: Diagnosis not present

## 2024-02-11 DIAGNOSIS — Z79891 Long term (current) use of opiate analgesic: Secondary | ICD-10-CM | POA: Diagnosis not present

## 2024-02-11 DIAGNOSIS — Z604 Social exclusion and rejection: Secondary | ICD-10-CM | POA: Diagnosis not present

## 2024-02-11 DIAGNOSIS — M47816 Spondylosis without myelopathy or radiculopathy, lumbar region: Secondary | ICD-10-CM | POA: Diagnosis not present

## 2024-02-11 DIAGNOSIS — E559 Vitamin D deficiency, unspecified: Secondary | ICD-10-CM | POA: Diagnosis not present

## 2024-02-11 DIAGNOSIS — I350 Nonrheumatic aortic (valve) stenosis: Secondary | ICD-10-CM | POA: Diagnosis not present

## 2024-02-11 DIAGNOSIS — I5022 Chronic systolic (congestive) heart failure: Secondary | ICD-10-CM | POA: Diagnosis not present

## 2024-02-11 DIAGNOSIS — K219 Gastro-esophageal reflux disease without esophagitis: Secondary | ICD-10-CM | POA: Diagnosis not present

## 2024-02-11 DIAGNOSIS — Z556 Problems related to health literacy: Secondary | ICD-10-CM | POA: Diagnosis not present

## 2024-02-11 DIAGNOSIS — E785 Hyperlipidemia, unspecified: Secondary | ICD-10-CM | POA: Diagnosis not present

## 2024-02-15 DIAGNOSIS — E559 Vitamin D deficiency, unspecified: Secondary | ICD-10-CM | POA: Diagnosis not present

## 2024-02-15 DIAGNOSIS — M47816 Spondylosis without myelopathy or radiculopathy, lumbar region: Secondary | ICD-10-CM | POA: Diagnosis not present

## 2024-02-15 DIAGNOSIS — I5022 Chronic systolic (congestive) heart failure: Secondary | ICD-10-CM | POA: Diagnosis not present

## 2024-02-15 DIAGNOSIS — Z556 Problems related to health literacy: Secondary | ICD-10-CM | POA: Diagnosis not present

## 2024-02-15 DIAGNOSIS — I110 Hypertensive heart disease with heart failure: Secondary | ICD-10-CM | POA: Diagnosis not present

## 2024-02-15 DIAGNOSIS — I5042 Chronic combined systolic (congestive) and diastolic (congestive) heart failure: Secondary | ICD-10-CM | POA: Diagnosis not present

## 2024-02-15 DIAGNOSIS — Z604 Social exclusion and rejection: Secondary | ICD-10-CM | POA: Diagnosis not present

## 2024-02-15 DIAGNOSIS — E785 Hyperlipidemia, unspecified: Secondary | ICD-10-CM | POA: Diagnosis not present

## 2024-02-15 DIAGNOSIS — M1990 Unspecified osteoarthritis, unspecified site: Secondary | ICD-10-CM | POA: Diagnosis not present

## 2024-02-15 DIAGNOSIS — K219 Gastro-esophageal reflux disease without esophagitis: Secondary | ICD-10-CM | POA: Diagnosis not present

## 2024-02-15 DIAGNOSIS — I350 Nonrheumatic aortic (valve) stenosis: Secondary | ICD-10-CM | POA: Diagnosis not present

## 2024-02-15 DIAGNOSIS — Z79891 Long term (current) use of opiate analgesic: Secondary | ICD-10-CM | POA: Diagnosis not present

## 2024-02-18 DIAGNOSIS — Z79891 Long term (current) use of opiate analgesic: Secondary | ICD-10-CM | POA: Diagnosis not present

## 2024-02-18 DIAGNOSIS — I5042 Chronic combined systolic (congestive) and diastolic (congestive) heart failure: Secondary | ICD-10-CM | POA: Diagnosis not present

## 2024-02-18 DIAGNOSIS — M1990 Unspecified osteoarthritis, unspecified site: Secondary | ICD-10-CM | POA: Diagnosis not present

## 2024-02-18 DIAGNOSIS — Z515 Encounter for palliative care: Secondary | ICD-10-CM | POA: Diagnosis not present

## 2024-02-18 DIAGNOSIS — Z556 Problems related to health literacy: Secondary | ICD-10-CM | POA: Diagnosis not present

## 2024-02-18 DIAGNOSIS — E559 Vitamin D deficiency, unspecified: Secondary | ICD-10-CM | POA: Diagnosis not present

## 2024-02-18 DIAGNOSIS — K219 Gastro-esophageal reflux disease without esophagitis: Secondary | ICD-10-CM | POA: Diagnosis not present

## 2024-02-18 DIAGNOSIS — I110 Hypertensive heart disease with heart failure: Secondary | ICD-10-CM | POA: Diagnosis not present

## 2024-02-18 DIAGNOSIS — M47816 Spondylosis without myelopathy or radiculopathy, lumbar region: Secondary | ICD-10-CM | POA: Diagnosis not present

## 2024-02-18 DIAGNOSIS — I350 Nonrheumatic aortic (valve) stenosis: Secondary | ICD-10-CM | POA: Diagnosis not present

## 2024-02-18 DIAGNOSIS — Z604 Social exclusion and rejection: Secondary | ICD-10-CM | POA: Diagnosis not present

## 2024-02-18 DIAGNOSIS — E785 Hyperlipidemia, unspecified: Secondary | ICD-10-CM | POA: Diagnosis not present

## 2024-02-18 DIAGNOSIS — I5022 Chronic systolic (congestive) heart failure: Secondary | ICD-10-CM | POA: Diagnosis not present

## 2024-02-22 DIAGNOSIS — Z556 Problems related to health literacy: Secondary | ICD-10-CM | POA: Diagnosis not present

## 2024-02-22 DIAGNOSIS — E785 Hyperlipidemia, unspecified: Secondary | ICD-10-CM | POA: Diagnosis not present

## 2024-02-22 DIAGNOSIS — I5042 Chronic combined systolic (congestive) and diastolic (congestive) heart failure: Secondary | ICD-10-CM | POA: Diagnosis not present

## 2024-02-22 DIAGNOSIS — Z79891 Long term (current) use of opiate analgesic: Secondary | ICD-10-CM | POA: Diagnosis not present

## 2024-02-22 DIAGNOSIS — K219 Gastro-esophageal reflux disease without esophagitis: Secondary | ICD-10-CM | POA: Diagnosis not present

## 2024-02-22 DIAGNOSIS — I350 Nonrheumatic aortic (valve) stenosis: Secondary | ICD-10-CM | POA: Diagnosis not present

## 2024-02-22 DIAGNOSIS — M1990 Unspecified osteoarthritis, unspecified site: Secondary | ICD-10-CM | POA: Diagnosis not present

## 2024-02-22 DIAGNOSIS — Z604 Social exclusion and rejection: Secondary | ICD-10-CM | POA: Diagnosis not present

## 2024-02-22 DIAGNOSIS — M47816 Spondylosis without myelopathy or radiculopathy, lumbar region: Secondary | ICD-10-CM | POA: Diagnosis not present

## 2024-02-22 DIAGNOSIS — I5022 Chronic systolic (congestive) heart failure: Secondary | ICD-10-CM | POA: Diagnosis not present

## 2024-02-22 DIAGNOSIS — E559 Vitamin D deficiency, unspecified: Secondary | ICD-10-CM | POA: Diagnosis not present

## 2024-02-22 DIAGNOSIS — I110 Hypertensive heart disease with heart failure: Secondary | ICD-10-CM | POA: Diagnosis not present

## 2024-02-23 DIAGNOSIS — E559 Vitamin D deficiency, unspecified: Secondary | ICD-10-CM | POA: Diagnosis not present

## 2024-02-23 DIAGNOSIS — Z604 Social exclusion and rejection: Secondary | ICD-10-CM | POA: Diagnosis not present

## 2024-02-23 DIAGNOSIS — I110 Hypertensive heart disease with heart failure: Secondary | ICD-10-CM | POA: Diagnosis not present

## 2024-02-23 DIAGNOSIS — I350 Nonrheumatic aortic (valve) stenosis: Secondary | ICD-10-CM | POA: Diagnosis not present

## 2024-02-23 DIAGNOSIS — M47816 Spondylosis without myelopathy or radiculopathy, lumbar region: Secondary | ICD-10-CM | POA: Diagnosis not present

## 2024-02-23 DIAGNOSIS — K219 Gastro-esophageal reflux disease without esophagitis: Secondary | ICD-10-CM | POA: Diagnosis not present

## 2024-02-23 DIAGNOSIS — I5022 Chronic systolic (congestive) heart failure: Secondary | ICD-10-CM | POA: Diagnosis not present

## 2024-02-23 DIAGNOSIS — E785 Hyperlipidemia, unspecified: Secondary | ICD-10-CM | POA: Diagnosis not present

## 2024-02-23 DIAGNOSIS — M1990 Unspecified osteoarthritis, unspecified site: Secondary | ICD-10-CM | POA: Diagnosis not present

## 2024-02-23 DIAGNOSIS — I5042 Chronic combined systolic (congestive) and diastolic (congestive) heart failure: Secondary | ICD-10-CM | POA: Diagnosis not present

## 2024-02-23 DIAGNOSIS — Z556 Problems related to health literacy: Secondary | ICD-10-CM | POA: Diagnosis not present

## 2024-02-23 DIAGNOSIS — Z79891 Long term (current) use of opiate analgesic: Secondary | ICD-10-CM | POA: Diagnosis not present

## 2024-02-29 DIAGNOSIS — I350 Nonrheumatic aortic (valve) stenosis: Secondary | ICD-10-CM | POA: Diagnosis not present

## 2024-02-29 DIAGNOSIS — M47816 Spondylosis without myelopathy or radiculopathy, lumbar region: Secondary | ICD-10-CM | POA: Diagnosis not present

## 2024-02-29 DIAGNOSIS — Z604 Social exclusion and rejection: Secondary | ICD-10-CM | POA: Diagnosis not present

## 2024-02-29 DIAGNOSIS — Z556 Problems related to health literacy: Secondary | ICD-10-CM | POA: Diagnosis not present

## 2024-02-29 DIAGNOSIS — K219 Gastro-esophageal reflux disease without esophagitis: Secondary | ICD-10-CM | POA: Diagnosis not present

## 2024-02-29 DIAGNOSIS — I110 Hypertensive heart disease with heart failure: Secondary | ICD-10-CM | POA: Diagnosis not present

## 2024-02-29 DIAGNOSIS — I5022 Chronic systolic (congestive) heart failure: Secondary | ICD-10-CM | POA: Diagnosis not present

## 2024-02-29 DIAGNOSIS — I5042 Chronic combined systolic (congestive) and diastolic (congestive) heart failure: Secondary | ICD-10-CM | POA: Diagnosis not present

## 2024-02-29 DIAGNOSIS — M1990 Unspecified osteoarthritis, unspecified site: Secondary | ICD-10-CM | POA: Diagnosis not present

## 2024-02-29 DIAGNOSIS — E785 Hyperlipidemia, unspecified: Secondary | ICD-10-CM | POA: Diagnosis not present

## 2024-02-29 DIAGNOSIS — Z79891 Long term (current) use of opiate analgesic: Secondary | ICD-10-CM | POA: Diagnosis not present

## 2024-02-29 DIAGNOSIS — E559 Vitamin D deficiency, unspecified: Secondary | ICD-10-CM | POA: Diagnosis not present

## 2024-03-01 DIAGNOSIS — Z556 Problems related to health literacy: Secondary | ICD-10-CM | POA: Diagnosis not present

## 2024-03-01 DIAGNOSIS — E785 Hyperlipidemia, unspecified: Secondary | ICD-10-CM | POA: Diagnosis not present

## 2024-03-01 DIAGNOSIS — Z604 Social exclusion and rejection: Secondary | ICD-10-CM | POA: Diagnosis not present

## 2024-03-01 DIAGNOSIS — I110 Hypertensive heart disease with heart failure: Secondary | ICD-10-CM | POA: Diagnosis not present

## 2024-03-01 DIAGNOSIS — I5042 Chronic combined systolic (congestive) and diastolic (congestive) heart failure: Secondary | ICD-10-CM | POA: Diagnosis not present

## 2024-03-01 DIAGNOSIS — I5022 Chronic systolic (congestive) heart failure: Secondary | ICD-10-CM | POA: Diagnosis not present

## 2024-03-01 DIAGNOSIS — E559 Vitamin D deficiency, unspecified: Secondary | ICD-10-CM | POA: Diagnosis not present

## 2024-03-01 DIAGNOSIS — M47816 Spondylosis without myelopathy or radiculopathy, lumbar region: Secondary | ICD-10-CM | POA: Diagnosis not present

## 2024-03-01 DIAGNOSIS — M1990 Unspecified osteoarthritis, unspecified site: Secondary | ICD-10-CM | POA: Diagnosis not present

## 2024-03-01 DIAGNOSIS — Z79891 Long term (current) use of opiate analgesic: Secondary | ICD-10-CM | POA: Diagnosis not present

## 2024-03-01 DIAGNOSIS — K219 Gastro-esophageal reflux disease without esophagitis: Secondary | ICD-10-CM | POA: Diagnosis not present

## 2024-03-01 DIAGNOSIS — I350 Nonrheumatic aortic (valve) stenosis: Secondary | ICD-10-CM | POA: Diagnosis not present

## 2024-03-04 ENCOUNTER — Other Ambulatory Visit: Payer: Self-pay | Admitting: Internal Medicine

## 2024-03-07 DIAGNOSIS — I110 Hypertensive heart disease with heart failure: Secondary | ICD-10-CM | POA: Diagnosis not present

## 2024-03-07 DIAGNOSIS — E559 Vitamin D deficiency, unspecified: Secondary | ICD-10-CM | POA: Diagnosis not present

## 2024-03-07 DIAGNOSIS — E785 Hyperlipidemia, unspecified: Secondary | ICD-10-CM | POA: Diagnosis not present

## 2024-03-07 DIAGNOSIS — K219 Gastro-esophageal reflux disease without esophagitis: Secondary | ICD-10-CM | POA: Diagnosis not present

## 2024-03-07 DIAGNOSIS — I5042 Chronic combined systolic (congestive) and diastolic (congestive) heart failure: Secondary | ICD-10-CM | POA: Diagnosis not present

## 2024-03-07 DIAGNOSIS — Z604 Social exclusion and rejection: Secondary | ICD-10-CM | POA: Diagnosis not present

## 2024-03-07 DIAGNOSIS — Z556 Problems related to health literacy: Secondary | ICD-10-CM | POA: Diagnosis not present

## 2024-03-07 DIAGNOSIS — M47816 Spondylosis without myelopathy or radiculopathy, lumbar region: Secondary | ICD-10-CM | POA: Diagnosis not present

## 2024-03-07 DIAGNOSIS — M1990 Unspecified osteoarthritis, unspecified site: Secondary | ICD-10-CM | POA: Diagnosis not present

## 2024-03-07 DIAGNOSIS — I5022 Chronic systolic (congestive) heart failure: Secondary | ICD-10-CM | POA: Diagnosis not present

## 2024-03-07 DIAGNOSIS — Z79891 Long term (current) use of opiate analgesic: Secondary | ICD-10-CM | POA: Diagnosis not present

## 2024-03-07 DIAGNOSIS — I350 Nonrheumatic aortic (valve) stenosis: Secondary | ICD-10-CM | POA: Diagnosis not present

## 2024-03-08 DIAGNOSIS — E559 Vitamin D deficiency, unspecified: Secondary | ICD-10-CM | POA: Diagnosis not present

## 2024-03-08 DIAGNOSIS — I110 Hypertensive heart disease with heart failure: Secondary | ICD-10-CM | POA: Diagnosis not present

## 2024-03-08 DIAGNOSIS — E785 Hyperlipidemia, unspecified: Secondary | ICD-10-CM | POA: Diagnosis not present

## 2024-03-08 DIAGNOSIS — K219 Gastro-esophageal reflux disease without esophagitis: Secondary | ICD-10-CM | POA: Diagnosis not present

## 2024-03-08 DIAGNOSIS — I5042 Chronic combined systolic (congestive) and diastolic (congestive) heart failure: Secondary | ICD-10-CM | POA: Diagnosis not present

## 2024-03-08 DIAGNOSIS — M47816 Spondylosis without myelopathy or radiculopathy, lumbar region: Secondary | ICD-10-CM | POA: Diagnosis not present

## 2024-03-08 DIAGNOSIS — Z556 Problems related to health literacy: Secondary | ICD-10-CM | POA: Diagnosis not present

## 2024-03-08 DIAGNOSIS — Z79891 Long term (current) use of opiate analgesic: Secondary | ICD-10-CM | POA: Diagnosis not present

## 2024-03-08 DIAGNOSIS — I5022 Chronic systolic (congestive) heart failure: Secondary | ICD-10-CM | POA: Diagnosis not present

## 2024-03-08 DIAGNOSIS — Z604 Social exclusion and rejection: Secondary | ICD-10-CM | POA: Diagnosis not present

## 2024-03-08 DIAGNOSIS — I350 Nonrheumatic aortic (valve) stenosis: Secondary | ICD-10-CM | POA: Diagnosis not present

## 2024-03-08 DIAGNOSIS — M1990 Unspecified osteoarthritis, unspecified site: Secondary | ICD-10-CM | POA: Diagnosis not present

## 2024-04-09 ENCOUNTER — Other Ambulatory Visit: Payer: Self-pay | Admitting: Internal Medicine

## 2024-05-06 ENCOUNTER — Other Ambulatory Visit: Payer: Self-pay | Admitting: Internal Medicine

## 2024-05-09 DIAGNOSIS — H353131 Nonexudative age-related macular degeneration, bilateral, early dry stage: Secondary | ICD-10-CM | POA: Diagnosis not present

## 2024-05-09 DIAGNOSIS — H524 Presbyopia: Secondary | ICD-10-CM | POA: Diagnosis not present

## 2024-05-09 DIAGNOSIS — H04123 Dry eye syndrome of bilateral lacrimal glands: Secondary | ICD-10-CM | POA: Diagnosis not present

## 2024-05-13 ENCOUNTER — Emergency Department (HOSPITAL_BASED_OUTPATIENT_CLINIC_OR_DEPARTMENT_OTHER)

## 2024-05-13 ENCOUNTER — Emergency Department (HOSPITAL_BASED_OUTPATIENT_CLINIC_OR_DEPARTMENT_OTHER)
Admission: EM | Admit: 2024-05-13 | Discharge: 2024-05-13 | Disposition: A | Discharge status: Home / Self Care | Attending: Emergency Medicine | Admitting: Emergency Medicine

## 2024-05-13 DIAGNOSIS — R0789 Other chest pain: Secondary | ICD-10-CM | POA: Insufficient documentation

## 2024-05-13 DIAGNOSIS — I6782 Cerebral ischemia: Secondary | ICD-10-CM | POA: Diagnosis not present

## 2024-05-13 DIAGNOSIS — Z79899 Other long term (current) drug therapy: Secondary | ICD-10-CM | POA: Diagnosis not present

## 2024-05-13 DIAGNOSIS — I350 Nonrheumatic aortic (valve) stenosis: Secondary | ICD-10-CM | POA: Insufficient documentation

## 2024-05-13 DIAGNOSIS — K219 Gastro-esophageal reflux disease without esophagitis: Secondary | ICD-10-CM | POA: Insufficient documentation

## 2024-05-13 DIAGNOSIS — I5022 Chronic systolic (congestive) heart failure: Secondary | ICD-10-CM | POA: Diagnosis not present

## 2024-05-13 DIAGNOSIS — I110 Hypertensive heart disease with heart failure: Secondary | ICD-10-CM | POA: Diagnosis not present

## 2024-05-13 DIAGNOSIS — E860 Dehydration: Secondary | ICD-10-CM | POA: Diagnosis not present

## 2024-05-13 DIAGNOSIS — R410 Disorientation, unspecified: Secondary | ICD-10-CM | POA: Diagnosis not present

## 2024-05-13 DIAGNOSIS — I447 Left bundle-branch block, unspecified: Secondary | ICD-10-CM | POA: Insufficient documentation

## 2024-05-13 DIAGNOSIS — R509 Fever, unspecified: Secondary | ICD-10-CM | POA: Diagnosis not present

## 2024-05-13 DIAGNOSIS — Z85828 Personal history of other malignant neoplasm of skin: Secondary | ICD-10-CM | POA: Diagnosis not present

## 2024-05-13 DIAGNOSIS — I700 Atherosclerosis of aorta: Secondary | ICD-10-CM | POA: Diagnosis not present

## 2024-05-13 DIAGNOSIS — R262 Difficulty in walking, not elsewhere classified: Secondary | ICD-10-CM | POA: Diagnosis present

## 2024-05-13 DIAGNOSIS — F0390 Unspecified dementia without behavioral disturbance: Secondary | ICD-10-CM | POA: Insufficient documentation

## 2024-05-13 DIAGNOSIS — R531 Weakness: Secondary | ICD-10-CM | POA: Diagnosis present

## 2024-05-13 DIAGNOSIS — R4182 Altered mental status, unspecified: Secondary | ICD-10-CM | POA: Diagnosis present

## 2024-05-13 LAB — COMPREHENSIVE METABOLIC PANEL WITH GFR
ALT: 14 U/L (ref 0–44)
AST: 18 U/L (ref 15–41)
Albumin: 4.9 g/dL (ref 3.5–5.0)
Alkaline Phosphatase: 57 U/L (ref 38–126)
Anion gap: 14 (ref 5–15)
BUN: 25 mg/dL — ABNORMAL HIGH (ref 8–23)
CO2: 24 mmol/L (ref 22–32)
Calcium: 10.5 mg/dL — ABNORMAL HIGH (ref 8.9–10.3)
Chloride: 105 mmol/L (ref 98–111)
Creatinine, Ser: 0.91 mg/dL (ref 0.44–1.00)
GFR, Estimated: >60 mL/min (ref >60)
Glucose, Bld: 107 mg/dL — ABNORMAL HIGH (ref 70–99)
Potassium: 3.9 mmol/L (ref 3.5–5.1)
Sodium: 142 mmol/L (ref 135–145)
Total Bilirubin: 0.4 mg/dL (ref 0.0–1.2)
Total Protein: 7.6 g/dL (ref 6.5–8.1)

## 2024-05-13 LAB — URINE DRUG SCREEN
Amphetamines: NEGATIVE
Barbiturates: NEGATIVE
Benzodiazepines: NEGATIVE
Cocaine: NEGATIVE
Fentanyl: NEGATIVE
Methadone Scn, Ur: NEGATIVE
Opiates: NEGATIVE
Tetrahydrocannabinol: NEGATIVE

## 2024-05-13 LAB — CBC WITH DIFFERENTIAL/PLATELET
Abs Immature Granulocytes: 0.02 K/uL (ref 0.00–0.07)
Basophils Absolute: 0.1 K/uL (ref 0.0–0.1)
Basophils Relative: 1 %
Eosinophils Absolute: 0.1 K/uL (ref 0.0–0.5)
Eosinophils Relative: 2 %
HCT: 38.1 % (ref 36.0–46.0)
Hemoglobin: 12.6 g/dL (ref 12.0–15.0)
Immature Granulocytes: 0 %
Lymphocytes Relative: 19 %
Lymphs Abs: 1.3 K/uL (ref 0.7–4.0)
MCH: 31.1 pg (ref 26.0–34.0)
MCHC: 33.1 g/dL (ref 30.0–36.0)
MCV: 94.1 fL (ref 80.0–100.0)
Monocytes Absolute: 0.6 K/uL (ref 0.1–1.0)
Monocytes Relative: 10 %
Neutro Abs: 4.5 K/uL (ref 1.7–7.7)
Neutrophils Relative %: 68 %
Platelets: 217 K/uL (ref 150–400)
RBC: 4.05 MIL/uL (ref 3.87–5.11)
RDW: 12.4 % (ref 11.5–15.5)
WBC: 6.5 K/uL (ref 4.0–10.5)
nRBC: 0 % (ref 0.0–0.2)

## 2024-05-13 LAB — URINALYSIS, ROUTINE W REFLEX MICROSCOPIC
Bilirubin Urine: NEGATIVE
Glucose, UA: NEGATIVE mg/dL
Hgb urine dipstick: NEGATIVE
Ketones, ur: NEGATIVE mg/dL
Leukocytes,Ua: NEGATIVE
Nitrite: NEGATIVE
Protein, ur: NEGATIVE mg/dL
Specific Gravity, Urine: 1.007 (ref 1.005–1.030)
pH: 6 (ref 5.0–8.0)

## 2024-05-13 LAB — AMMONIA: Ammonia: 17 umol/L (ref 9–35)

## 2024-05-13 LAB — RESP PANEL BY RT-PCR (RSV, FLU A&B, COVID)  RVPGX2
Influenza A by PCR: NEGATIVE
Influenza B by PCR: NEGATIVE
Resp Syncytial Virus by PCR: NEGATIVE
SARS Coronavirus 2 by RT PCR: NEGATIVE

## 2024-05-13 LAB — LIPASE, BLOOD: Lipase: 31 U/L (ref 11–51)

## 2024-05-13 LAB — TROPONIN T, HIGH SENSITIVITY
Troponin T High Sensitivity: 24 ng/L — ABNORMAL HIGH (ref 0–19)
Troponin T High Sensitivity: 21 ng/L — ABNORMAL HIGH (ref 0–19)

## 2024-05-13 MED ORDER — ALUM & MAG HYDROXIDE-SIMETH 200-200-20 MG/5ML PO SUSP
30.0000 mL | Freq: Once | ORAL | Status: AC
  Administered 2024-05-13: 30 mL via ORAL
  Filled 2024-05-13: qty 30

## 2024-05-13 MED ORDER — SODIUM CHLORIDE 0.9 % IV BOLUS
1000.0000 mL | Freq: Once | INTRAVENOUS | Status: AC
  Administered 2024-05-13: 1000 mL via INTRAVENOUS

## 2024-05-13 NOTE — Discharge Instructions (Addendum)
 It was a pleasure caring for you today in the emergency department.  Be sure to eat throughout the day, drink plenty of liquids.  Consider adding protein meal supplement beverage such as boost or Ensure to ensure that you get enough calories.  Please follow-up with your PCP for ongoing care  Please return to the emergency department for any worsening or worrisome symptoms.

## 2024-05-13 NOTE — ED Provider Notes (Signed)
 [place] EMERGENCY DEPARTMENT AT [facility] Provider Note  CSN: [id number] Arrival date & time: 05/13/24 1128  Chief Complaint(s) Altered Mental Status  HPI [name] is a 88 y.o. female with past medical history as below, significant for aortic stenosis, GERD, dementia, HFrEF, hypertension who presents to the ED with complaint of AMS, weakness.  Patient is here with her daughter, patient with intermittent confusion over the past 2 to 3 days.  Patient also reports weakness to her legs over the past 24 hours, difficulty walking.  Reports her legs feel weak bilateral.  She can normally ambulate in her home without assistance but does use a walker when she is ambulating for more than a few steps or when she goes outside.  She denies any falls or other injuries, no low back pain, no fevers or chills.  Unilateral numbness or weakness to her extremities, daughter reports patient had some slight slurred speech yesterday but has since improved.  Daughter reports patient episode confusion yesterday which improved after she ate a meal.  Patient also reports intermittent chest discomfort for an unknown duration of time.  No dyspnea or palpitations, unable to characterize the chest discomfort any further    Patient is a poor historian given her underlying dementia.    Past Medical History 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   Depression    GERD (gastroesophageal reflux disease)    History of gastroesophageal reflux (GERD)    Hx of adenomatous colonic polyps    Hx of cardiac cath 2006   neg   Hypertension    Mixed urge and stress incontinence    OAB (overactive bladder) 07/13/2012   Osteoarthritis    Osteopenia    rx fosamax  08-2013   Personal  history of colonic polyps - adenomas 04/19/2014   Patient Active Problem List   Diagnosis Date Noted   Dizziness 12/25/2023   Chronic HFrEF (heart failure with reduced ejection fraction) (HCC) 12/25/2023   Lower back pain 12/25/2023   Constipation 12/25/2023   Influenza A 09/13/2023   Pain in both knees 06/15/2023   Vitamin D  deficiency 06/15/2023   Hyperglycemia, unspecified 06/15/2023   Dementia without behavioral disturbance (HCC) 02/23/2021   Closed fracture of left distal radius 07/31/2019   Closed nondisplaced fracture of lateral malleolus of right fibula 01/17/2019   PCP NOTES >>>>>>>>>>>>>>>> 09/03/2015   History of colonic polyps 04/19/2014   OAB (overactive bladder) 07/13/2012   Annual physical exam 12/17/2010   Aortic stenosis 01/01/2010   Hyperlipidemia 11/21/2008   GERD 11/28/2007   Depression 06/24/2007   Essential hypertension 06/24/2007   OSTEOARTHRITIS 06/24/2007   Osteoporosis 06/24/2007   Home Medication(s) Prior to Admission medications   Medication Sig Start Date End Date Taking? Authorizing Provider  acetaminophen  (TYLENOL ) 325 MG tablet Take 650 mg by mouth in the morning and at bedtime. 08/28/19   [provider]  cholecalciferol (VITAMIN D3) 25 MCG (1000 UNIT) tablet Take 1,000 Units by mouth in the morning and at bedtime.    [provider]  lidocaine  (LIDODERM ) 5 % Place 1 patch onto the skin daily. Remove & Discard patch within 12 hours or as directed by MD 12/28/23   Caleen Qualia, MD  meclizine  (ANTIVERT ) 12.5  MG tablet Take 1 tablet (12.5 mg total) by mouth 2 (two) times daily as needed for dizziness. 12/28/23   Caleen Qualia, MD  memantine  (NAMENDA ) 5 MG tablet Take 1 tablet (5 mg total) by mouth 2 (two) times daily. Needs appt 05/08/24   Amon Aloysius BRAVO, MD  omeprazole  (PRILOSEC) 20 MG capsule Take 1 capsule (20 mg total) by mouth 2 (two) times daily before a meal. Needs appt 05/08/24   Paz, Jose E, MD  polyethylene glycol (MIRALAX  /  GLYCOLAX ) 17 g packet Take 17 g by mouth daily as needed. 12/28/23   Amin, Sumayya, MD  simvastatin  (ZOCOR ) 20 MG tablet Take 1 tablet (20 mg total) by mouth daily. 04/10/24   Amon Aloysius BRAVO, MD  venlafaxine  XR (EFFEXOR -XR) 75 MG 24 hr capsule Take 1 capsule (75 mg total) by mouth daily with breakfast. Needs appt 05/08/24   Amon Aloysius BRAVO, MD                                                                                                                                    Past Surgical History Past Surgical History:  Procedure Laterality Date   ABDOMINAL HYSTERECTOMY  1971   APPENDECTOMY  1971   (at time of hyst?)   BLADDER SURGERY  2003   CATARACT EXTRACTION, BILATERAL     CHOLECYSTECTOMY     OOPHORECTOMY  1971   ROTATOR CUFF REPAIR Right    TOE SURGERY  1995   R 2nd --- correction of claw toe    Family History Family History  Problem Relation Age of Onset   Coronary artery disease Father 6       had CABG   Cancer Father        gallbladder   Stroke Mother 98   Ovarian cancer Sister 47   Colon cancer Neg Hx        NEGATIVE   Breast cancer Neg Hx        NEGATIVE    Social History Social History   Tobacco Use   Smoking status: Never   Smokeless tobacco: Never  Vaping Use   Vaping status: Never Used  Substance Use Topics   Alcohol use: Not Currently    Comment: wine rarely   Drug use: No   Allergies Patient has no known allergies.  Review of Systems A thorough review of systems was obtained and all systems are negative except as noted in the HPI and PMH.   Physical Exam Vital Signs  I have reviewed the triage vital signs BP (!) 161/92   Pulse 93   Resp 15   SpO2 95%  Physical Exam Vitals and nursing note reviewed.  Constitutional:      General: She is not in acute distress.    Appearance: Normal appearance.  HENT:     Head: Normocephalic and atraumatic.     Right Ear: External ear normal.  Left Ear: External ear normal.     Nose: Nose normal.      Mouth/Throat:     Mouth: Mucous membranes are moist.  Eyes:     General: No scleral icterus.       Right eye: No discharge.        Left eye: No discharge.     Extraocular Movements: Extraocular movements intact.     Pupils: Pupils are equal, round, and reactive to light.  Cardiovascular:     Rate and Rhythm: Normal rate and regular rhythm.     Pulses: Normal pulses.     Heart sounds: Murmur heard.  Pulmonary:     Effort: Pulmonary effort is normal. No respiratory distress.     Breath sounds: Normal breath sounds. No stridor.  Abdominal:     General: Abdomen is flat. There is no distension.     Palpations: Abdomen is soft.     Tenderness: There is no abdominal tenderness.  Musculoskeletal:     Cervical back: No rigidity.     Right lower leg: No edema.     Left lower leg: No edema.  Skin:    General: Skin is warm and dry.     Capillary Refill: Capillary refill takes less than 2 seconds.  Neurological:     Mental Status: She is alert and oriented to person, place, and time.     GCS: GCS eye subscore is 4. GCS verbal subscore is 5. GCS motor subscore is 6.     Cranial Nerves: Cranial nerves 2-12 are intact.     Sensory: Sensation is intact.     Motor: Motor function is intact.     Coordination: Coordination is intact.  Psychiatric:        Mood and Affect: Mood normal.        Behavior: Behavior normal. Behavior is cooperative.     ED Results and Treatments Labs (all labs ordered are listed, but only abnormal results are displayed) Labs Reviewed  COMPREHENSIVE METABOLIC PANEL WITH GFR - Abnormal; Notable for the following components:      Result Value   Glucose, Bld 107 (*)    BUN 25 (*)    Calcium 10.5 (*)    All other components within normal limits  URINALYSIS, ROUTINE W REFLEX MICROSCOPIC - Abnormal; Notable for the following components:   Color, Urine COLORLESS (*)    All other components within normal limits  TROPONIN T, HIGH SENSITIVITY - Abnormal; Notable for the  following components:   Troponin T High Sensitivity 24 (*)    All other components within normal limits  RESP PANEL BY RT-PCR (RSV, FLU A&B, COVID)  RVPGX2  CBC WITH DIFFERENTIAL/PLATELET  LIPASE, BLOOD  AMMONIA  URINE DRUG SCREEN  TROPONIN T, HIGH SENSITIVITY                                                                                                                          Radiology DG Chest Portable  1 View Result Date: 05/13/2024 EXAM: 1 VIEW XRAY OF THE CHEST 05/13/2024 12:38:51 PM COMPARISON: 12/25/2023 CLINICAL HISTORY: Altered Mental Status FINDINGS: LUNGS AND PLEURA: No focal pulmonary opacity. No pulmonary edema. No pleural effusion. No pneumothorax. HEART AND MEDIASTINUM: No acute abnormality of the cardiac and mediastinal silhouettes. Aortic atherosclerotic plaque. BONES AND SOFT TISSUES: No acute osseous abnormality. IMPRESSION: 1. No acute findings. Electronically signed by: Waddell Calk MD 05/13/2024 12:47 PM EDT RP Workstation: [id number]   CT Head Wo Contrast Result Date: 05/13/2024 CLINICAL DATA:  Delirium EXAM: CT HEAD WITHOUT CONTRAST TECHNIQUE: Contiguous axial images were obtained from the base of the skull through the vertex without intravenous contrast. RADIATION DOSE REDUCTION: This exam was performed according to the departmental dose-optimization program which includes automated exposure control, adjustment of the mA and/or kV according to patient size and/or use of iterative reconstruction technique. COMPARISON:  CT head April 12, 25. FINDINGS: Brain: No evidence of acute infarction, hemorrhage, hydrocephalus, extra-axial collection or mass lesion/mass effect. Advanced patchy white matter hypodensities are similar to prior and compatible with chronic microvascular ischemic disease. Vascular: Calcific atherosclerosis.  No hyperdense vessel. Skull: No acute fracture.  Hyperostosis frontalis. Sinuses/Orbits: Mostly clear sinuses. Opacified left ethmoid air cell. Left  concha bullosa. No acute orbital findings. Other: No mastoid effusions. IMPRESSION: 1. No evidence of acute intracranial abnormality. 2. Advanced chronic microvascular ischemic disease. Electronically Signed   By: Gilmore GORMAN Molt M.D.   On: 05/13/2024 12:44    Pertinent labs & imaging results that were available during my care of the patient were reviewed by me and considered in my medical decision making (see MDM for details).  Medications Ordered in ED Medications - No data to display                                                                                                                                   Procedures Procedures  (including critical care time)  Medical Decision Making / ED Course    Medical Decision Making:    DAWNA JAKES is a 88 y.o. female with past medical history as below, significant for aortic stenosis, GERD, dementia, HFrEF, hypertension who presents to the ED with complaint of AMS, weakness.. The complaint involves an extensive differential diagnosis and also carries with it a high risk of complications and morbidity.  Serious etiology was considered. Ddx includes but is not limited to: Differential diagnoses for altered mental status includes but is not exclusive to alcohol, illicit or prescription medications, intracranial pathology such as stroke, intracerebral hemorrhage, fever or infectious causes including sepsis, hypoxemia, uremia, trauma, endocrine related disorders such as diabetes, hypoglycemia, thyroid -related diseases, etc.   Complete initial physical exam performed, notably the patient was in no acute distress.    Reviewed and confirmed nursing documentation for past medical history, family history, social history.  Vital signs reviewed.    AMS Weakness> - Neurologically intact, strength equal and symmetric to  extremities, currently at baseline per daughter at bedside  Chest discomfort > - History aortic stenosis, midsternal chest  discomfort that does not appear to radiate.  Patient unable to identify alleviating or exacerbating factors       ***               Additional history obtained: -Additional history obtained from {wsadditionalhistorian:28072} -External records from outside source obtained and reviewed including: Chart review including previous notes, labs, imaging, consultation notes including  ***   Lab Tests: -I ordered, reviewed, and interpreted labs.   The pertinent results include:   Labs Reviewed  COMPREHENSIVE METABOLIC PANEL WITH GFR - Abnormal; Notable for the following components:      Result Value   Glucose, Bld 107 (*)    BUN 25 (*)    Calcium 10.5 (*)    All other components within normal limits  URINALYSIS, ROUTINE W REFLEX MICROSCOPIC - Abnormal; Notable for the following components:   Color, Urine COLORLESS (*)    All other components within normal limits  TROPONIN T, HIGH SENSITIVITY - Abnormal; Notable for the following components:   Troponin T High Sensitivity 24 (*)    All other components within normal limits  RESP PANEL BY RT-PCR (RSV, FLU A&B, COVID)  RVPGX2  CBC WITH DIFFERENTIAL/PLATELET  LIPASE, BLOOD  AMMONIA  URINE DRUG SCREEN  TROPONIN T, HIGH SENSITIVITY    Notable for ***  EKG   EKG Interpretation Date/Time:    Ventricular Rate:    PR Interval:    QRS Duration:    QT Interval:    QTC Calculation:   R Axis:      Text Interpretation:           Imaging Studies ordered: I ordered imaging studies including *** I independently visualized the following imaging with scope of interpretation limited to determining acute life threatening conditions related to emergency care; findings noted above I agree with the radiologist interpretation If any imaging was obtained with contrast I closely monitored patient for any possible adverse reaction a/w contrast administration in the emergency department   Medicines ordered and prescription drug  management: No orders of the defined types were placed in this encounter.   -I have reviewed the patients home medicines and have made adjustments as needed   Consultations Obtained: I requested consultation with the ***,  and discussed lab and imaging findings as well as pertinent plan - they recommend: ***   Cardiac Monitoring: The patient was maintained on a cardiac monitor.  I personally viewed and interpreted the cardiac monitored which showed an underlying rhythm of: *** Continuous pulse oximetry interpreted by myself, ***% on ***.    Social Determinants of Health:  Diagnosis or treatment significantly limited by social determinants of health: {wssoc:28071}   Reevaluation: After the interventions noted above, I reevaluated the patient and found that they have {resolved/improved/worsened:23923::improved}  Co morbidities that complicate the patient evaluation  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   Depression    GERD (gastroesophageal reflux disease)    History of gastroesophageal reflux (GERD)    Hx of adenomatous colonic polyps    Hx of cardiac cath 2006   neg   Hypertension    Mixed urge  and stress incontinence    OAB (overactive bladder) 07/13/2012   Osteoarthritis    Osteopenia    rx fosamax  08-2013   Personal history of colonic polyps - adenomas 04/19/2014      Dispostion: Disposition decision including need for hospitalization was considered, and patient {wsdispo:28070::discharged from emergency department.}    Final Clinical Impression(s) / ED Diagnoses Final diagnoses:  None

## 2024-05-13 NOTE — ED Triage Notes (Signed)
 Per patient's daughter patient has been more confused than usual for the past few days. Hx of dementia.

## 2024-05-17 ENCOUNTER — Observation Stay (HOSPITAL_COMMUNITY)
Admission: EM | Admit: 2024-05-17 | Discharge: 2024-05-25 | Disposition: A | Discharge status: Skilled Nursing Facility | Attending: Internal Medicine | Admitting: Internal Medicine

## 2024-05-17 ENCOUNTER — Other Ambulatory Visit: Payer: Self-pay

## 2024-05-17 ENCOUNTER — Emergency Department (HOSPITAL_COMMUNITY)

## 2024-05-17 DIAGNOSIS — R2681 Unsteadiness on feet: Secondary | ICD-10-CM | POA: Insufficient documentation

## 2024-05-17 DIAGNOSIS — I700 Atherosclerosis of aorta: Secondary | ICD-10-CM | POA: Diagnosis not present

## 2024-05-17 DIAGNOSIS — Z85828 Personal history of other malignant neoplasm of skin: Secondary | ICD-10-CM | POA: Diagnosis not present

## 2024-05-17 DIAGNOSIS — G9341 Metabolic encephalopathy: Secondary | ICD-10-CM | POA: Diagnosis not present

## 2024-05-17 DIAGNOSIS — N390 Urinary tract infection, site not specified: Secondary | ICD-10-CM | POA: Insufficient documentation

## 2024-05-17 DIAGNOSIS — E785 Hyperlipidemia, unspecified: Secondary | ICD-10-CM | POA: Insufficient documentation

## 2024-05-17 DIAGNOSIS — M6281 Muscle weakness (generalized): Secondary | ICD-10-CM | POA: Diagnosis not present

## 2024-05-17 DIAGNOSIS — R4182 Altered mental status, unspecified: Secondary | ICD-10-CM | POA: Diagnosis present

## 2024-05-17 DIAGNOSIS — F419 Anxiety disorder, unspecified: Secondary | ICD-10-CM | POA: Insufficient documentation

## 2024-05-17 DIAGNOSIS — I5042 Chronic combined systolic (congestive) and diastolic (congestive) heart failure: Secondary | ICD-10-CM | POA: Diagnosis not present

## 2024-05-17 DIAGNOSIS — F0390 Unspecified dementia without behavioral disturbance: Secondary | ICD-10-CM | POA: Diagnosis not present

## 2024-05-17 DIAGNOSIS — I110 Hypertensive heart disease with heart failure: Secondary | ICD-10-CM | POA: Insufficient documentation

## 2024-05-17 DIAGNOSIS — G9340 Encephalopathy, unspecified: Secondary | ICD-10-CM | POA: Diagnosis present

## 2024-05-17 DIAGNOSIS — R2689 Other abnormalities of gait and mobility: Secondary | ICD-10-CM | POA: Diagnosis not present

## 2024-05-17 DIAGNOSIS — Z743 Need for continuous supervision: Secondary | ICD-10-CM | POA: Diagnosis not present

## 2024-05-17 DIAGNOSIS — I10 Essential (primary) hypertension: Secondary | ICD-10-CM | POA: Diagnosis not present

## 2024-05-17 LAB — CBC WITH DIFFERENTIAL/PLATELET
Abs Immature Granulocytes: 0.01 K/uL (ref 0.00–0.07)
Basophils Absolute: 0.1 K/uL (ref 0.0–0.1)
Basophils Relative: 1 %
Eosinophils Absolute: 0.1 K/uL (ref 0.0–0.5)
Eosinophils Relative: 2 %
HCT: 36.7 % (ref 36.0–46.0)
Hemoglobin: 11.9 g/dL — ABNORMAL LOW (ref 12.0–15.0)
Immature Granulocytes: 0 %
Lymphocytes Relative: 28 %
Lymphs Abs: 1.2 K/uL (ref 0.7–4.0)
MCH: 31.4 pg (ref 26.0–34.0)
MCHC: 32.4 g/dL (ref 30.0–36.0)
MCV: 96.8 fL (ref 80.0–100.0)
Monocytes Absolute: 0.4 K/uL (ref 0.1–1.0)
Monocytes Relative: 10 %
Neutro Abs: 2.4 K/uL (ref 1.7–7.7)
Neutrophils Relative %: 59 %
Platelets: 199 K/uL (ref 150–400)
RBC: 3.79 MIL/uL — ABNORMAL LOW (ref 3.87–5.11)
RDW: 12.4 % (ref 11.5–15.5)
WBC: 4.1 K/uL (ref 4.0–10.5)
nRBC: 0 % (ref 0.0–0.2)

## 2024-05-17 LAB — MAGNESIUM: Magnesium: 2.2 mg/dL (ref 1.7–2.4)

## 2024-05-17 LAB — RESP PANEL BY RT-PCR (RSV, FLU A&B, COVID)  RVPGX2
Influenza A by PCR: NEGATIVE
Influenza B by PCR: NEGATIVE
Resp Syncytial Virus by PCR: NEGATIVE
SARS Coronavirus 2 by RT PCR: NEGATIVE

## 2024-05-17 LAB — I-STAT CHEM 8, ED
BUN: 39 mg/dL — ABNORMAL HIGH (ref 8–23)
Calcium, Ion: 1.16 mmol/L (ref 1.15–1.40)
Chloride: 111 mmol/L (ref 98–111)
Creatinine, Ser: 0.9 mg/dL (ref 0.44–1.00)
Glucose, Bld: 95 mg/dL (ref 70–99)
HCT: 34 % — ABNORMAL LOW (ref 36.0–46.0)
Hemoglobin: 11.6 g/dL — ABNORMAL LOW (ref 12.0–15.0)
Potassium: 3.8 mmol/L (ref 3.5–5.1)
Sodium: 143 mmol/L (ref 135–145)
TCO2: 22 mmol/L (ref 22–32)

## 2024-05-17 LAB — URINALYSIS, W/ REFLEX TO CULTURE (INFECTION SUSPECTED)
Bilirubin Urine: NEGATIVE
Glucose, UA: NEGATIVE mg/dL
Hgb urine dipstick: NEGATIVE
Ketones, ur: NEGATIVE mg/dL
Nitrite: NEGATIVE
Protein, ur: NEGATIVE mg/dL
Specific Gravity, Urine: 1.014 (ref 1.005–1.030)
pH: 5 (ref 5.0–8.0)

## 2024-05-17 LAB — BRAIN NATRIURETIC PEPTIDE: B Natriuretic Peptide: 54.3 pg/mL (ref 0.0–100.0)

## 2024-05-17 LAB — I-STAT CG4 LACTIC ACID, ED: Lactic Acid, Venous: 0.5 mmol/L (ref 0.5–1.9)

## 2024-05-17 MED ORDER — ONDANSETRON HCL 4 MG PO TABS
4.0000 mg | ORAL_TABLET | Freq: Four times a day (QID) | ORAL | Status: DC | PRN
  Administered 2024-05-18: 4 mg via ORAL
  Filled 2024-05-17: qty 1

## 2024-05-17 MED ORDER — ACETAMINOPHEN 325 MG PO TABS
650.0000 mg | ORAL_TABLET | Freq: Four times a day (QID) | ORAL | Status: DC | PRN
  Administered 2024-05-18 – 2024-05-24 (×2): 650 mg via ORAL
  Filled 2024-05-17 (×2): qty 2

## 2024-05-17 MED ORDER — SODIUM CHLORIDE 0.9 % IV BOLUS: 500.0000 mL | Freq: Once | INTRAVENOUS | Status: DC

## 2024-05-17 MED ORDER — SODIUM CHLORIDE 0.9 % IV BOLUS
1000.0000 mL | Freq: Once | INTRAVENOUS | Status: AC
  Administered 2024-05-17: 1000 mL via INTRAVENOUS

## 2024-05-17 MED ORDER — ENOXAPARIN SODIUM 40 MG/0.4ML IJ SOSY
40.0000 mg | PREFILLED_SYRINGE | INTRAMUSCULAR | Status: DC
  Administered 2024-05-18 – 2024-05-24 (×7): 40 mg via SUBCUTANEOUS
  Filled 2024-05-17 (×7): qty 0.4

## 2024-05-17 MED ORDER — ONDANSETRON HCL 4 MG/2ML IJ SOLN: 4.0000 mg | Freq: Four times a day (QID) | INTRAMUSCULAR | Status: DC | PRN

## 2024-05-17 MED ORDER — ACETAMINOPHEN 650 MG RE SUPP: 650.0000 mg | Freq: Four times a day (QID) | RECTAL | Status: DC | PRN

## 2024-05-17 MED ORDER — SODIUM CHLORIDE 0.9 % IV SOLN
1.0000 g | Freq: Once | INTRAVENOUS | Status: AC
  Administered 2024-05-17: 1 g via INTRAVENOUS
  Filled 2024-05-17: qty 10

## 2024-05-17 MED ORDER — SENNOSIDES-DOCUSATE SODIUM 8.6-50 MG PO TABS
1.0000 | ORAL_TABLET | Freq: Every evening | ORAL | Status: DC | PRN
Start: 2024-05-17 — End: 2024-05-25

## 2024-05-17 MED ORDER — SODIUM CHLORIDE 0.9 % IV SOLN
1.0000 g | INTRAVENOUS | Status: DC
  Administered 2024-05-18 – 2024-05-23 (×6): 1 g via INTRAVENOUS
  Filled 2024-05-17 (×6): qty 10

## 2024-05-17 MED ORDER — SODIUM CHLORIDE 0.9 % IV SOLN: INTRAVENOUS | Status: DC

## 2024-05-17 NOTE — ED Triage Notes (Signed)
 Pt with hx of dementia here for eval of continued confusion x 4 days. Daughter concerned as she was looking at results from when she was seen at [facility] on 8/30 for same and saw results that indicate UTI and she did not receive any meds for same. EMS reports new LBBB.

## 2024-05-17 NOTE — H&P (Addendum)
 History and Physical    [name] FMW:[id number] DOB: Jul 18, 1936 DOA: 05/17/2024  PCP: Amon Aloysius BRAVO, MD   Patient coming from: Home  I have personally briefly reviewed patient's old medical records in [facility]  Chief Complaint: Altered mental status  HPI: [name] is a 88 y.o. female with medical history significant of dementia with anxiety, hypertension, hyperlipidemia, chronic systolic and diastolic heart failure presented with altered mental status.  Patient has dementia and does not provide reliable history.  History is obtained from the patient's daughter at bedside.  I have reviewed patient's medical records myself.  Patient was apparently seen in the [facility] on August 30 for similar presentation; was thought to be dehydrated, treated empirically with IV fluids and discharged home.  Over the last 4 days, daughter has noticed that patient has become more confused, beyond her baseline.  No fevers, cough, congestion, fall, loss of consciousness, seizures, vomiting, abdominal pain, chest pain.  ED Course: Patient was mildly tachycardic and tachypneic.  WBC and renal function was normal.  Chest x-ray negative for active disease.  UA suggestive of UTI.  She was started on IV Rocephin .  Patient's daughter did not feel comfortable taking the patient back to independent living facility where the patient lives alone. Hospitalist service was called to evaluate the patient.  Review of Systems: Could not be obtained because of patient's confusional state. 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   Depression    GERD (gastroesophageal reflux disease)    History of gastroesophageal reflux (GERD)    Hx of adenomatous colonic polyps    Hx of cardiac cath  2006   neg   Hypertension    Mixed urge and stress incontinence    OAB (overactive bladder) 07/13/2012   Osteoarthritis    Osteopenia    rx fosamax  08-2013   Personal history of colonic polyps - adenomas 04/19/2014    Past Surgical History:  Procedure Laterality Date   ABDOMINAL HYSTERECTOMY  1971   APPENDECTOMY  1971   (at time of hyst?)   BLADDER SURGERY  2003   CATARACT EXTRACTION, BILATERAL     CHOLECYSTECTOMY     OOPHORECTOMY  1971   ROTATOR CUFF REPAIR Right    TOE SURGERY  1995   R 2nd --- correction of claw toe      reports that she has never smoked. She has never used smokeless tobacco. She reports that she does not currently use alcohol. She reports that she does not use drugs.  No Known Allergies  Family History  Problem Relation Age of Onset   Coronary artery disease Father 39       had CABG   Cancer Father        gallbladder   Stroke Mother 54   Ovarian cancer Sister 72   Colon cancer Neg Hx        NEGATIVE   Breast cancer Neg Hx        NEGATIVE    Prior to Admission medications   Medication Sig Start Date End Date Taking? Authorizing Provider  acetaminophen  (TYLENOL ) 325 MG tablet Take 650 mg by mouth in the morning and at bedtime. 08/28/19   [provider]  cholecalciferol (VITAMIN D3) 25 MCG (1000 UNIT) tablet Take 1,000 Units by mouth in the morning and at bedtime.    [provider]  lidocaine  (LIDODERM ) 5 % Place 1 patch onto the skin daily. Remove & Discard patch within 12 hours or as directed by MD 12/28/23   Caleen Qualia, MD  meclizine  (ANTIVERT ) 12.5 MG tablet Take 1 tablet (12.5 mg total) by mouth 2 (two) times daily as needed for dizziness. 12/28/23   Caleen Qualia, MD  memantine  (NAMENDA ) 5 MG tablet Take 1 tablet (5 mg total) by mouth 2 (two) times daily. Needs appt 05/08/24   Amon Aloysius BRAVO, MD  omeprazole  (PRILOSEC) 20 MG capsule Take 1 capsule (20 mg total) by mouth 2 (two) times daily before a meal. Needs appt 05/08/24   Paz,  Jose E, MD  polyethylene glycol (MIRALAX  / GLYCOLAX ) 17 g packet Take 17 g by mouth daily as needed. 12/28/23   Amin, Sumayya, MD  simvastatin  (ZOCOR ) 20 MG tablet Take 1 tablet (20 mg total) by mouth daily. 04/10/24   Amon Aloysius BRAVO, MD  venlafaxine  XR (EFFEXOR -XR) 75 MG 24 hr capsule Take 1 capsule (75 mg total) by mouth daily with breakfast. Needs appt 05/08/24   Amon Aloysius BRAVO, MD    Physical Exam: Vitals:   05/17/24 1208 05/17/24 1215 05/17/24 1223 05/17/24 1345  BP: (!) 155/86   (!) 151/87  Pulse:  (!) 111  95  Resp:  (!) 27  12  Temp: (!) 97.4 F (36.3 C)     TempSrc: Oral     SpO2:  95% 96% 100%    Constitutional: NAD, calm, comfortable.  Elderly female lying in bed. Vitals:   05/17/24 1208 05/17/24 1215 05/17/24 1223 05/17/24 1345  BP: (!) 155/86   (!) 151/87  Pulse:  (!) 111  95  Resp:  (!) 27  12  Temp: (!) 97.4 F (36.3 C)     TempSrc: Oral     SpO2:  95% 96% 100%   Eyes: PERRL, lids and conjunctivae normal ENMT: Mucous membranes are moist. Posterior pharynx clear of any exudate or lesions. Neck: normal, supple, no masses, no thyromegaly Respiratory: bilateral decreased breath sounds at bases, no wheezing, no crackles.  Intermittently tachypneic.  No accessory muscle use.  Cardiovascular: S1 S2 positive, guarding.  No extremity edema. 2+ pedal pulses.  Abdomen: no tenderness, no masses palpated. No hepatosplenomegaly. Bowel sounds positive.  Musculoskeletal: no clubbing / cyanosis. No joint deformity upper and lower extremities.  Skin: no rashes, lesions, ulcers. No induration Neurologic: No obvious focal deficits noted.  Awake, confused.  Moving extremities.   Psychiatric: Cannot be assessed because of mental status.  Currently not agitated.  Labs on Admission: I have personally reviewed following labs and imaging studies  CBC: Recent Labs  Lab 05/13/24 1225 05/17/24 1230 05/17/24 1316  WBC 6.5 4.1  --   NEUTROABS 4.5 2.4  --   HGB 12.6 11.9* 11.6*  HCT 38.1  36.7 34.0*  MCV 94.1 96.8  --   PLT 217 199  --    Basic Metabolic Panel: Recent Labs  Lab 05/13/24 1225 05/17/24 1230 05/17/24 1316  NA 142  --  143  K 3.9  --  3.8  CL 105  --  111  CO2 24  --   --   GLUCOSE 107*  --  95  BUN 25*  --  39*  CREATININE 0.91  --  0.90  CALCIUM 10.5*  --   --  MG  --  2.2  --    GFR: CrCl cannot be calculated (Unknown ideal weight.). Liver Function Tests: Recent Labs  Lab 05/13/24 1225  AST 18  ALT 14  ALKPHOS 57  BILITOT 0.4  PROT 7.6  ALBUMIN 4.9   Recent Labs  Lab 05/13/24 1225  LIPASE 31   Recent Labs  Lab 05/13/24 1225  AMMONIA 17   Coagulation Profile: No results for input(s): INR, PROTIME in the last 168 hours. Cardiac Enzymes: No results for input(s): CKTOTAL, CKMB, CKMBINDEX, TROPONINI in the last 168 hours. BNP (last 3 results) No results for input(s): PROBNP in the last 8760 hours. HbA1C: No results for input(s): HGBA1C in the last 72 hours. CBG: No results for input(s): GLUCAP in the last 168 hours. Lipid Profile: No results for input(s): CHOL, HDL, LDLCALC, TRIG, CHOLHDL, LDLDIRECT in the last 72 hours. Thyroid  Function Tests: No results for input(s): TSH, T4TOTAL, FREET4, T3FREE, THYROIDAB in the last 72 hours. Anemia Panel: No results for input(s): VITAMINB12, FOLATE, FERRITIN, TIBC, IRON, RETICCTPCT in the last 72 hours. Urine analysis:    Component Value Date/Time   COLORURINE YELLOW 05/17/2024 1229   APPEARANCEUR CLEAR 05/17/2024 1229   LABSPEC 1.014 05/17/2024 1229   PHURINE 5.0 05/17/2024 1229   GLUCOSEU NEGATIVE 05/17/2024 1229   GLUCOSEU NEGATIVE 07/14/2022 1600   HGBUR NEGATIVE 05/17/2024 1229   BILIRUBINUR NEGATIVE 05/17/2024 1229   BILIRUBINUR Negative 02/05/2023 1402   KETONESUR NEGATIVE 05/17/2024 1229   PROTEINUR NEGATIVE 05/17/2024 1229   UROBILINOGEN 0.2 02/05/2023 1402   UROBILINOGEN 0.2 07/14/2022 1600   NITRITE NEGATIVE  05/17/2024 1229   LEUKOCYTESUR SMALL (A) 05/17/2024 1229    Radiological Exams on Admission: DG Chest Portable 1 View Result Date: 05/17/2024 CLINICAL DATA:  Altered mental status.  Concern for pneumonia. EXAM: PORTABLE CHEST 1 VIEW COMPARISON:  Chest radiograph dated 05/13/2024. FINDINGS: No focal consolidation, pleural effusion, or pneumothorax. Stable cardiac silhouette. Atherosclerotic calcification of the aorta. No acute osseous pathology. IMPRESSION: No active disease. Electronically Signed   By: Vanetta Chou M.D.   On: 05/17/2024 13:22    Assessment/Plan  Acute metabolic encephalopathy - Possibly from UTI.  Recent CT head on 05/13/2024 was negative for acute intracranial abnormality. - Will treat the patient with IV antibiotics.  If no improvement in mental status, will need MRI of brain - Fall precautions.  PT/OT/SLP evaluation.  Monitor mental status  UTI: Present on admission - Continue Rocephin .  Follow cultures.  History of dementia with anxiety - Resume home regimen once verified  Hypertension - Blood pressure on the higher side.  Not on any antihypertensives as an outpatient.  Monitor blood pressure  Hyperlipidemia -Resume simvastatin  once home regimen is verified  DVT prophylaxis: Lovenox  Code Status: DNR. Confirmed by the daughter at bedside Family Communication: Daughter at bedside Disposition Plan: Pending clinical improvement Consults called: None Admission status: Observation/telemetry  Severity of Illness: The appropriate patient status for this patient is OBSERVATION. Observation status is judged to be reasonable and necessary in order to provide the required intensity of service to ensure the patient's safety. The patient's presenting symptoms, physical exam findings, and initial radiographic and laboratory data in the context of their medical condition is felt to place them at decreased risk for further clinical deterioration. Furthermore, it is  anticipated that the patient will be medically stable for discharge from the hospital within 2 midnights of admission.     Sophie Mao MD [facility]  05/17/2024, 3:34 PM

## 2024-05-17 NOTE — ED Provider Notes (Signed)
 [place] EMERGENCY DEPARTMENT AT [facility] Provider Note   CSN: [id number] Arrival date & time: 05/17/24  1201     Patient presents with: Altered Mental Status   [name] is a 88 y.o. female.  With a history of hypertension, left bundle branch block, hyperlipidemia, dementia and HFrEF who presents to the ED for altered mental status.  Patient recently seen in the [facility] ED on August 30 for similar presentation.  She was treated empirically for dehydration and discharged home.  Over the last 4 days acute confusion has been worsening beyond her baseline per daughter.  Global confusion.  No falls or trauma.  No fevers at home coughing or congestion.  No other overt infectious symptoms.  Patient herself is unable to contribute additional history at this time secondary to clinical condition    Altered Mental Status      Prior to Admission medications   Medication Sig Start Date End Date Taking? Authorizing Provider  acetaminophen  (TYLENOL ) 325 MG tablet Take 650 mg by mouth in the morning and at bedtime. 08/28/19   [provider]  alendronate  (FOSAMAX ) 70 MG tablet Take 70 mg by mouth once a week. 11/24/23   [provider]  cholecalciferol (VITAMIN D3) 25 MCG (1000 UNIT) tablet Take 1,000 Units by mouth in the morning and at bedtime.    [provider]  lidocaine  (LIDODERM ) 5 % Place 1 patch onto the skin daily. Remove & Discard patch within 12 hours or as directed by MD 12/28/23   Caleen Qualia, MD  losartan  (COZAAR ) 25 MG tablet Take 25 mg by mouth daily. 01/15/24   [provider]  meclizine  (ANTIVERT ) 12.5 MG tablet Take 1 tablet (12.5 mg total) by mouth 2 (two) times daily as needed for dizziness. 12/28/23   Caleen Qualia, MD  memantine  (NAMENDA ) 5 MG tablet Take 1 tablet (5 mg total) by mouth 2 (two) times daily. Needs appt 05/08/24   Amon Aloysius BRAVO, MD  omeprazole  (PRILOSEC) 20 MG capsule Take 1 capsule (20 mg total) by mouth 2 (two)  times daily before a meal. Needs appt 05/08/24   Paz, Jose E, MD  polyethylene glycol (MIRALAX  / GLYCOLAX ) 17 g packet Take 17 g by mouth daily as needed. 12/28/23   Amin, Sumayya, MD  simvastatin  (ZOCOR ) 20 MG tablet Take 1 tablet (20 mg total) by mouth daily. 04/10/24   Amon Aloysius BRAVO, MD  venlafaxine  XR (EFFEXOR -XR) 75 MG 24 hr capsule Take 1 capsule (75 mg total) by mouth daily with breakfast. Needs appt 05/08/24   Paz, Jose E, MD    Allergies: Patient has no known allergies.    Review of Systems  Updated Vital Signs BP (!) 146/85 (BP Location: Right Arm)   Pulse (!) 102   Temp 97.9 F (36.6 C) (Oral)   Resp 18   SpO2 97%   Physical Exam Vitals and nursing note reviewed.  HENT:     Head: Normocephalic and atraumatic.  Eyes:     Pupils: Pupils are equal, round, and reactive to light.  Cardiovascular:     Rate and Rhythm: Normal rate and regular rhythm.  Pulmonary:     Effort: Pulmonary effort is normal.     Breath sounds: Normal breath sounds.  Abdominal:     Palpations: Abdomen is soft.     Tenderness: There is no abdominal tenderness.  Skin:    General: Skin is warm and dry.  Neurological:     Mental Status: She  is alert and oriented to person, place, and time.     Sensory: No sensory deficit.     Motor: No weakness.  Psychiatric:        Mood and Affect: Mood normal.     (all labs ordered are listed, but only abnormal results are displayed) Labs Reviewed  URINALYSIS, W/ REFLEX TO CULTURE (INFECTION SUSPECTED) - Abnormal; Notable for the following components:      Result Value   Leukocytes,Ua SMALL (*)    Bacteria, UA RARE (*)    All other components within normal limits  CBC WITH DIFFERENTIAL/PLATELET - Abnormal; Notable for the following components:   RBC 3.79 (*)    Hemoglobin 11.9 (*)    All other components within normal limits  I-STAT CHEM 8, ED - Abnormal; Notable for the following components:   BUN 39 (*)    Hemoglobin 11.6 (*)    HCT 34.0 (*)    All  other components within normal limits  RESP PANEL BY RT-PCR (RSV, FLU A&B, COVID)  RVPGX2  CULTURE, BLOOD (ROUTINE X 2)  CULTURE, BLOOD (ROUTINE X 2)  URINE CULTURE  BRAIN NATRIURETIC PEPTIDE  MAGNESIUM  I-STAT CG4 LACTIC ACID, ED    EKG: EKG Interpretation Date/Time:  Wednesday May 17 2024 12:40:04 EDT Ventricular Rate:  91 PR Interval:  212 QRS Duration:  167 QT Interval:  404 QTC Calculation: 498 R Axis:   -23  Text Interpretation: Sinus or ectopic atrial rhythm Borderline prolonged PR interval Left bundle branch block Confirmed by Pamella Sharper [phone]) on 05/17/2024 3:10:42 PM  Radiology: [facility] Chest Portable 1 View Result Date: 05/17/2024 CLINICAL DATA:  Altered mental status.  Concern for pneumonia. EXAM: PORTABLE CHEST 1 VIEW COMPARISON:  Chest radiograph dated 05/13/2024. FINDINGS: No focal consolidation, pleural effusion, or pneumothorax. Stable cardiac silhouette. Atherosclerotic calcification of the aorta. No acute osseous pathology. IMPRESSION: No active disease. Electronically Signed   By: Vanetta Chou M.D.   On: 05/17/2024 13:22     Procedures   Medications Ordered in the ED  cefTRIAXone  (ROCEPHIN ) 1 g in sodium chloride  0.9 % 100 mL IVPB (1 g Intravenous New Bag/Given 05/17/24 1553)  enoxaparin  (LOVENOX ) injection 40 mg (has no administration in time range)  0.9 %  sodium chloride  infusion (has no administration in time range)  acetaminophen  (TYLENOL ) tablet 650 mg (has no administration in time range)    Or  acetaminophen  (TYLENOL ) suppository 650 mg (has no administration in time range)  ondansetron  (ZOFRAN ) tablet 4 mg (has no administration in time range)    Or  ondansetron  (ZOFRAN ) injection 4 mg (has no administration in time range)  senna-docusate (Senokot-S) tablet 1 tablet (has no administration in time range)  cefTRIAXone  (ROCEPHIN ) 1 g in sodium chloride  0.9 % 100 mL IVPB (has no administration in time range)  sodium chloride  0.9 % bolus 1,000 mL  (0 mLs Intravenous Stopped 05/17/24 1412)    Clinical Course as of 05/17/24 1559  Wed May 17, 2024  1558 Laboratory workup shows UTI we will need to send for culture no significant leukocytosis.  No elevation of his lactic.  Suspicion for sepsis however given patient's altered mental status likely due to the UTI once again and the fact that she lives alone she will require admission for altered mental status and urinary tract infection.  Discussed with admitting hospitalist who accept patient for admission [MP]    Clinical Course User Index [MP] Pamella Sharper LABOR, DO  Medical Decision Making 88 year old female with history as above recently seen in [facility] ED returns today for acute confusion.  4 days of increased global confusion.  Recently seen and treated for dehydration.  She is afebrile slightly hypertensive but is tachycardic tachypneic.  Presentation most concerning for acute infectious etiology such as UTI or pneumonia.  Will obtain laboratory workup chest x-ray viral swab and UA and continue to monitor.  Amount and/or Complexity of Data Reviewed Labs: ordered. Radiology: ordered.  Risk Decision regarding hospitalization.        Final diagnoses:  Altered mental status, unspecified altered mental status type  Lower urinary tract infectious disease    ED Discharge Orders     None          Pamella Ozell LABOR, DO 05/17/24 1559

## 2024-05-17 NOTE — ED Notes (Signed)
 X-ray at bedside.

## 2024-05-18 ENCOUNTER — Encounter (HOSPITAL_COMMUNITY): Payer: Self-pay | Admitting: Internal Medicine

## 2024-05-18 DIAGNOSIS — R2681 Unsteadiness on feet: Secondary | ICD-10-CM | POA: Diagnosis not present

## 2024-05-18 DIAGNOSIS — G9341 Metabolic encephalopathy: Secondary | ICD-10-CM | POA: Diagnosis not present

## 2024-05-18 DIAGNOSIS — E785 Hyperlipidemia, unspecified: Secondary | ICD-10-CM | POA: Diagnosis not present

## 2024-05-18 DIAGNOSIS — I5042 Chronic combined systolic (congestive) and diastolic (congestive) heart failure: Secondary | ICD-10-CM | POA: Diagnosis not present

## 2024-05-18 DIAGNOSIS — Z85828 Personal history of other malignant neoplasm of skin: Secondary | ICD-10-CM | POA: Diagnosis not present

## 2024-05-18 DIAGNOSIS — I110 Hypertensive heart disease with heart failure: Secondary | ICD-10-CM | POA: Diagnosis not present

## 2024-05-18 DIAGNOSIS — M6281 Muscle weakness (generalized): Secondary | ICD-10-CM | POA: Diagnosis not present

## 2024-05-18 DIAGNOSIS — R2689 Other abnormalities of gait and mobility: Secondary | ICD-10-CM | POA: Diagnosis not present

## 2024-05-18 DIAGNOSIS — G9340 Encephalopathy, unspecified: Secondary | ICD-10-CM | POA: Diagnosis not present

## 2024-05-18 DIAGNOSIS — N390 Urinary tract infection, site not specified: Secondary | ICD-10-CM | POA: Diagnosis not present

## 2024-05-18 LAB — COMPREHENSIVE METABOLIC PANEL WITH GFR
ALT: 31 U/L (ref 0–44)
AST: 27 U/L (ref 15–41)
Albumin: 3.9 g/dL (ref 3.5–5.0)
Alkaline Phosphatase: 44 U/L (ref 38–126)
Anion gap: 10 (ref 5–15)
BUN: 23 mg/dL (ref 8–23)
CO2: 24 mmol/L (ref 22–32)
Calcium: 9.4 mg/dL (ref 8.9–10.3)
Chloride: 109 mmol/L (ref 98–111)
Creatinine, Ser: 0.9 mg/dL (ref 0.44–1.00)
GFR, Estimated: >60 mL/min (ref >60)
Glucose, Bld: 107 mg/dL — ABNORMAL HIGH (ref 70–99)
Potassium: 3.9 mmol/L (ref 3.5–5.1)
Sodium: 143 mmol/L (ref 135–145)
Total Bilirubin: 0.6 mg/dL (ref 0.0–1.2)
Total Protein: 6.5 g/dL (ref 6.5–8.1)

## 2024-05-18 LAB — URINE CULTURE: Culture: <10000 — AB

## 2024-05-18 LAB — CBC
HCT: 37.6 % (ref 36.0–46.0)
Hemoglobin: 12.6 g/dL (ref 12.0–15.0)
MCH: 31.4 pg (ref 26.0–34.0)
MCHC: 33.5 g/dL (ref 30.0–36.0)
MCV: 93.8 fL (ref 80.0–100.0)
Platelets: 210 K/uL (ref 150–400)
RBC: 4.01 MIL/uL (ref 3.87–5.11)
RDW: 12.2 % (ref 11.5–15.5)
WBC: 5.7 K/uL (ref 4.0–10.5)
nRBC: 0 % (ref 0.0–0.2)

## 2024-05-18 LAB — FOLATE: Folate: >20 ng/mL (ref >5.9)

## 2024-05-18 LAB — MAGNESIUM: Magnesium: 2.1 mg/dL (ref 1.7–2.4)

## 2024-05-18 LAB — VITAMIN B12: Vitamin B-12: 431 pg/mL (ref 180–914)

## 2024-05-18 LAB — AMMONIA: Ammonia: 20 umol/L (ref 9–35)

## 2024-05-18 LAB — TSH: TSH: 3.275 u[IU]/mL (ref 0.350–4.500)

## 2024-05-18 MED ORDER — MEMANTINE HCL 10 MG PO TABS
5.0000 mg | ORAL_TABLET | Freq: Two times a day (BID) | ORAL | Status: DC
  Administered 2024-05-18 – 2024-05-25 (×15): 5 mg via ORAL
  Filled 2024-05-18 (×15): qty 1

## 2024-05-18 MED ORDER — SODIUM CHLORIDE 0.9 % IV SOLN: INTRAVENOUS | Status: DC

## 2024-05-18 MED ORDER — METOPROLOL TARTRATE 25 MG PO TABS
25.0000 mg | ORAL_TABLET | Freq: Two times a day (BID) | ORAL | Status: DC
  Administered 2024-05-18 – 2024-05-25 (×15): 25 mg via ORAL
  Filled 2024-05-18 (×15): qty 1

## 2024-05-18 MED ORDER — SIMVASTATIN 20 MG PO TABS
20.0000 mg | ORAL_TABLET | Freq: Every day | ORAL | Status: DC
  Administered 2024-05-18 – 2024-05-24 (×7): 20 mg via ORAL
  Filled 2024-05-18 (×7): qty 1

## 2024-05-18 NOTE — NC FL2 (Addendum)
 [name]  MEDICAID FL2 LEVEL OF CARE FORM     IDENTIFICATION  Patient Name: [name] Birthdate: 1936/08/16 Sex: female Admission Date (Current Location): 05/17/2024  [facility] and [place] Number:  Producer, television/film/video and Address:  [facility], [address] [place], [place] [address]      Provider Number: [id number]  Attending Physician Name and Address:  Kelli Page, MD  Relative Name and Phone Number:  [name] [phone]    Current Level of Care: Hospital Recommended Level of Care: Skilled Nursing Facility Prior Approval Number:    Date Approved/Denied:   PASRR Number: Pending  Discharge Plan: SNF    Current Diagnoses: Patient Active Problem List   Diagnosis Date Noted   Acute encephalopathy 05/17/2024   Dizziness 12/25/2023   Chronic HFrEF (heart failure with reduced ejection fraction) (HCC) 12/25/2023   Lower back pain 12/25/2023   Constipation 12/25/2023   Influenza A 09/13/2023   Pain in both knees 06/15/2023   Vitamin D  deficiency 06/15/2023   Hyperglycemia, unspecified 06/15/2023   Dementia without behavioral disturbance (HCC) 02/23/2021   Closed fracture of left distal radius 07/31/2019   Closed nondisplaced fracture of lateral malleolus of right fibula 01/17/2019   PCP NOTES >>>>>>>>>>>>>>>> 09/03/2015   History of colonic polyps 04/19/2014   OAB (overactive bladder) 07/13/2012   Annual physical exam 12/17/2010   Aortic stenosis 01/01/2010   Hyperlipidemia 11/21/2008   GERD 11/28/2007   Depression 06/24/2007   Essential hypertension 06/24/2007   OSTEOARTHRITIS 06/24/2007   Osteoporosis 06/24/2007    Orientation RESPIRATION BLADDER Height & Weight     Self, Place  Normal Continent Weight: 185 lb 6.5 oz (84.1 kg) Height:     BEHAVIORAL SYMPTOMS/MOOD NEUROLOGICAL BOWEL NUTRITION STATUS      Continent Diet (see dc summary)  AMBULATORY STATUS COMMUNICATION OF NEEDS Skin   Limited Assist Verbally Normal                        Personal Care Assistance Level of Assistance  Bathing, Feeding, Dressing Bathing Assistance: Limited assistance Feeding assistance: Independent Dressing Assistance: Limited assistance     Functional Limitations Info  Sight, Hearing, Speech Sight Info: Adequate Hearing Info: Adequate Speech Info: Adequate    SPECIAL CARE FACTORS FREQUENCY  PT (By licensed PT), OT (By licensed OT)     PT Frequency: 5x a week OT Frequency: 5x a week            Contractures Contractures Info: Not present    Additional Factors Info  Code Status, Allergies Code Status Info: DNR Allergies Info: NKA           Current Medications (05/18/2024):  This is the current hospital active medication list Current Facility-Administered Medications  Medication Dose Route Frequency Provider Last Rate Last Admin   0.9 %  sodium chloride  infusion   Intravenous Continuous Kelli, Kshitiz, MD 75 mL/hr at 05/18/24 1123 Rate Change at 05/18/24 1123   acetaminophen  (TYLENOL ) tablet 650 mg  650 mg Oral Q6H PRN Kelli Page, MD   650 mg at 05/18/24 0957   Or   acetaminophen  (TYLENOL ) suppository 650 mg  650 mg Rectal Q6H PRN Kelli Page, MD       cefTRIAXone  (ROCEPHIN ) 1 g in sodium chloride  0.9 % 100 mL IVPB  1 g Intravenous Q24H Alekh, Kshitiz, MD 200 mL/hr at 05/18/24 1004 1 g at 05/18/24 1004   enoxaparin  (LOVENOX ) injection 40 mg  40 mg  Subcutaneous Q24H Kelli Page, MD   40 mg at 05/18/24 1522   memantine  (NAMENDA ) tablet 5 mg  5 mg Oral BID Kelli Page, MD   5 mg at 05/18/24 1226   metoprolol  tartrate (LOPRESSOR ) tablet 25 mg  25 mg Oral BID Kelli Page, MD   25 mg at 05/18/24 0957   ondansetron  (ZOFRAN ) tablet 4 mg  4 mg Oral Q6H PRN Kelli Page, MD   4 mg at 05/18/24 0957   Or   ondansetron  (ZOFRAN ) injection 4 mg  4 mg Intravenous Q6H PRN Alekh, Kshitiz, MD       senna-docusate (Senokot-S) tablet 1 tablet  1 tablet Oral QHS PRN Kelli Page, MD       simvastatin   (ZOCOR ) tablet 20 mg  20 mg Oral q1800 Alekh, Kshitiz, MD       sodium chloride  0.9 % bolus 500 mL  500 mL Intravenous Once Rathore, Vasundhra, MD 250 mL/hr at 05/18/24 0817 Infusion Verify at 05/18/24 9182     Discharge Medications: Please see discharge summary for a list of discharge medications.  Relevant Imaging Results:  Relevant Lab Results:   Additional Information [id number], History of Dementia with anxiety, continue memantine .  Lendia Dais, LCSWA

## 2024-05-18 NOTE — Evaluation (Signed)
 Physical Therapy Evaluation Patient Details Name: [name] MRN: [id number] DOB: [birth year] Today's Date: 05/18/2024  History of Present Illness  88 y.o. presented 9/3 with worsening confusion. UA suggestive of UTI. PMH includes chronic systolic and diastolic heart failure, HLD, HTN and dementia.  Clinical Impression  Pt admitted with above diagnosis. Pleasantly confused with notable reduced short term memory throughout session, frequently repeating herself. Notes indicate pt from ILF. Pt states she completes her ADLs and ambulates indpdnently but does not prepare her own meals. Following simple step commands for functional tasks. Min assist for bed mobility, transfer, and gait. RW utilized for stability. Demonstrates posterior instability. Patient will benefit from continued inpatient follow up therapy, <3 hours/day. Pt currently with functional limitations due to the deficits listed below (see PT Problem List). Pt will benefit from acute skilled PT to increase their independence and safety with mobility to allow discharge.           If plan is discharge home, recommend the following: A little help with walking and/or transfers;A little help with bathing/dressing/bathroom;Assistance with cooking/housework;Direct supervision/assist for medications management;Direct supervision/assist for financial management;Assist for transportation;Supervision due to cognitive status   Can travel by private vehicle   Yes    Equipment Recommendations None recommended by PT  Recommendations for Other Services       Functional Status Assessment Patient has had a recent decline in their functional status and demonstrates the ability to make significant improvements in function in a reasonable and predictable amount of time.     Precautions / Restrictions Precautions Precautions: Fall Recall of Precautions/Restrictions: Impaired Restrictions Weight Bearing Restrictions Per Provider Order: No       Mobility  Bed Mobility Overal bed mobility: Needs Assistance Bed Mobility: Supine to Sit     Supine to sit: Min assist     General bed mobility comments: Min assist to sequence LEs off bed and rise. Cues for technique.    Transfers Overall transfer level: Needs assistance Equipment used: Rolling walker (2 wheels) Transfers: Sit to/from Stand Sit to Stand: Min assist           General transfer comment: Min assist for boost to stand with moderate posterior lean. Cues for hand placement and technique, performed from edge of bed. Fair control when sitting down into recliner with cues for hand placement.    Ambulation/Gait Ambulation/Gait assistance: Min assist Gait Distance (Feet): 15 Feet Assistive device: Rolling walker (2 wheels) Gait Pattern/deviations: Step-through pattern, Decreased stride length, Trunk flexed, Shuffle Gait velocity: dec Gait velocity interpretation: <1.31 ft/sec, indicative of household ambulator   General Gait Details: Cues for upright posture and larger step length with foot clearance. Min assist initially for balance but progressed to CGA towards end of distance and while turning. Close guard for safety but no overt buckling noted.  Stairs            Wheelchair Mobility     Tilt Bed    Modified Rankin (Stroke Patients Only)       Balance Overall balance assessment: Needs assistance Sitting-balance support: No upper extremity supported, Feet supported Sitting balance-Leahy Scale: Fair     Standing balance support: Bilateral upper extremity supported Standing balance-Leahy Scale: Poor                               Pertinent Vitals/Pain Pain Assessment Pain Assessment: No/denies pain    Home Living Family/patient expects to be discharged to::  Other (Comment) (ILF per records) Living Arrangements: Alone   Type of Home: Independent living facility         Home Layout: One level Home Equipment: Grab bars -  toilet;Cane - single point;Rolling Walker (2 wheels);Shower seat Additional Comments: Limited historian, having difficulty recalling; would confirm as cog improves.    Prior Function Prior Level of Function : Independent/Modified Independent             Mobility Comments: Denies falls, think she is using RW to mobilize ADLs Comments: Reports ind, but has food delivered - does not make her own meals.     Extremity/Trunk Assessment   Upper Extremity Assessment Upper Extremity Assessment: Defer to OT evaluation    Lower Extremity Assessment Lower Extremity Assessment: Generalized weakness       Communication   Communication Communication: No apparent difficulties;Impaired    Cognition Arousal: Alert Behavior During Therapy: WFL for tasks assessed/performed   PT - Cognitive impairments: No family/caregiver present to determine baseline, Orientation, Awareness, Memory, Safety/Judgement, Problem solving   Orientation impairments: Time, Situation                     Following commands: Impaired Following commands impaired: Only follows one step commands consistently, Follows one step commands inconsistently     Cueing Cueing Techniques: Verbal cues, Gestural cues, Tactile cues     General Comments General comments (skin integrity, edema, etc.): HR up to 130 per RN while mobilizing    Exercises     Assessment/Plan    PT Assessment Patient needs continued PT services  PT Problem List Decreased strength;Decreased activity tolerance;Decreased mobility;Decreased balance;Decreased knowledge of use of DME;Decreased cognition;Decreased safety awareness;Decreased knowledge of precautions;Cardiopulmonary status limiting activity       PT Treatment Interventions DME instruction;Gait training;Functional mobility training;Therapeutic activities;Therapeutic exercise;Balance training;Neuromuscular re-education;Cognitive remediation;Patient/family education    PT Goals  (Current goals can be found in the Care Plan section)  Acute Rehab PT Goals Patient Stated Goal: Get well PT Goal Formulation: With patient Time For Goal Achievement: 06/01/24 Potential to Achieve Goals: Good    Frequency Min 2X/week     Co-evaluation               AM-PAC PT 6 Clicks Mobility  Outcome Measure Help needed turning from your back to your side while in a flat bed without using bedrails?: A Little Help needed moving from lying on your back to sitting on the side of a flat bed without using bedrails?: A Little Help needed moving to and from a bed to a chair (including a wheelchair)?: A Little Help needed standing up from a chair using your arms (e.g., wheelchair or bedside chair)?: A Little Help needed to walk in hospital room?: A Little Help needed climbing 3-5 steps with a railing? : A Lot 6 Click Score: 17    End of Session Equipment Utilized During Treatment: Gait belt Activity Tolerance: Patient tolerated treatment well Patient left: in chair;with call bell/phone within reach;with chair alarm set Nurse Communication: Mobility status PT Visit Diagnosis: Unsteadiness on feet (R26.81);Other abnormalities of gait and mobility (R26.89);Muscle weakness (generalized) (M62.81);Other symptoms and signs involving the nervous system (R29.898);Difficulty in walking, not elsewhere classified (R26.2)    Time: 9196-9178 PT Time Calculation (min) (ACUTE ONLY): 18 min   Charges:   PT Evaluation $PT Eval Low Complexity: 1 Low   PT General Charges $$ ACUTE PT VISIT: 1 Visit         Leontine Roads, PT,  DPT [facility]  Rehabilitation Services Physical Therapist Office: [phone] Website: [email or web address]   Leontine GORMAN Roads 05/18/2024, 11:25 AM

## 2024-05-18 NOTE — TOC Initial Note (Signed)
 Transition of Care [facility]) - Initial/Assessment Note    Patient Details  Name: [name] MRN: [id number] Date of Birth: [birth year]  [facility]) CM/SW Contact:    Lendia Dais, LCSWA Phone Number: 05/18/2024, 3:35 PM  Clinical Narrative:  Pt is from home alone at [facility] (independent living). Pt will return upon discharge from SNF.  Pt is oriented x2 and requested CSW to speak to daughter. Pt is independent with ADL's and uses a rollator out in the community. Pt's HCPOA is the daughter Jenkins, CSW requested paper work and the daughter stated she will bring it tomorrow.   Pt has an [facility] Palliative RN the comes to her home and has seen pcp in the last year. Jenkins reported that the patient has a hx of anxiety and currently struggles with depression. Jenkins stated that she does not have any MH services but sees her pcp. Pt has no hx of substance abuse.   CSW spoke to [place] about PT recs of SNF. Jenkins was agreeable and requested for referrals to sent out to [place] area. Jenkins states that the patient was previously at [facility] in May/June of 2025. CSW explained depending on their insurance, they may have already used SNF days and it is possible for there to be a copay. Jenkins stated understanding and CSW stated they will follow up to confirm any copays.  Medicare list left in pt's room. CSW has sent out referrals in the HUB and will monitor for bed offers.         Expected Discharge Plan: Skilled Nursing Facility Barriers to Discharge: Continued Medical Work up   Patient Goals and CMS Choice Patient states their goals for this hospitalization and ongoing recovery are:: Return to [place] (independent living)   Choice offered to / list presented to : Patient      Expected Discharge Plan and Services In-house Referral: Clinical Social Work     Living arrangements for the past 2 months: Marketing executive                                       Prior Living Arrangements/Services Living arrangements for the past 2 months: Independent Living Facility Lives with:: Facility Resident Patient language and need for interpreter reviewed:: Yes Do you feel safe going back to the place where you live?: Yes      Need for Family Participation in Patient Care: Yes (Comment) Care giver support system in place?: Yes (comment)   Criminal Activity/Legal Involvement Pertinent to Current Situation/Hospitalization: No - Comment as needed  Activities of Daily Living   ADL Screening (condition at time of admission) Independently performs ADLs?: Yes (appropriate for developmental age) Is the patient deaf or have difficulty hearing?: No Does the patient have difficulty seeing, even when wearing glasses/contacts?: No Does the patient have difficulty concentrating, remembering, or making decisions?: Yes  Permission Sought/Granted Permission sought to share information with : Family Supports Permission granted to share information with : Yes, Verbal Permission Granted  Share Information with NAME: [name]  Permission granted to share info w AGENCY: [employer] granted to share info w Relationship: daughter  Permission granted to share info w Contact Information: [phone]  Emotional Assessment Appearance:: Appears stated age Attitude/Demeanor/Rapport: Engaged Affect (typically observed): Appropriate, Pleasant Orientation: : Oriented to Self, Oriented to Place Alcohol / Substance Use: Not Applicable Psych Involvement: No (comment)  Admission diagnosis:  Lower urinary tract infectious disease [N39.0] Acute encephalopathy [G93.40] Altered mental status, unspecified altered mental status type [R41.82] Patient Active Problem List   Diagnosis Date Noted   Acute encephalopathy 05/17/2024   Dizziness 12/25/2023   Chronic HFrEF (heart failure with reduced ejection fraction) (HCC) 12/25/2023   Lower back pain  12/25/2023   Constipation 12/25/2023   Influenza A 09/13/2023   Pain in both knees 06/15/2023   Vitamin D  deficiency 06/15/2023   Hyperglycemia, unspecified 06/15/2023   Dementia without behavioral disturbance (HCC) 02/23/2021   Closed fracture of left distal radius 07/31/2019   Closed nondisplaced fracture of lateral malleolus of right fibula 01/17/2019   PCP NOTES >>>>>>>>>>>>>>>> 09/03/2015   History of colonic polyps 04/19/2014   OAB (overactive bladder) 07/13/2012   Annual physical exam 12/17/2010   Aortic stenosis 01/01/2010   Hyperlipidemia 11/21/2008   GERD 11/28/2007   Depression 06/24/2007   Essential hypertension 06/24/2007   OSTEOARTHRITIS 06/24/2007   Osteoporosis 06/24/2007   PCP:  Amon Aloysius BRAVO, MD Pharmacy:   [facility] - [place], [place] - [address] [place] [address] Phone: [phone] Fax: [phone]  [facility] [address] [place], [place] - [address] [place] [address] Phone: [phone] Fax: [phone]  [id number] - [address], [place] - [address] [place] [address] Phone: [phone] Fax: [phone]     Social Drivers of Health (SDOH) Social History: SDOH Screenings   Food Insecurity: No Food Insecurity (05/18/2024)  Housing: Low Risk  (05/18/2024)  Transportation Needs: No Transportation Needs (05/18/2024)  Utilities: Not At Risk (05/18/2024)  Alcohol Screen: Low Risk  (08/19/2023)  Depression (PHQ2-9): Low Risk  (08/19/2023)  Financial Resource Strain: Low Risk  (08/19/2023)  Physical Activity: Inactive (08/19/2023)  Social Connections: Moderately Integrated (05/18/2024)  Stress: No Stress Concern Present (08/19/2023)  Tobacco Use: Low Risk  (01/16/2024)  Health Literacy: Adequate Health Literacy (08/19/2023)   SDOH Interventions:     Readmission Risk Interventions     No data to display

## 2024-05-18 NOTE — Progress Notes (Signed)
 PROGRESS NOTE    [name]  FMW:[id number] DOB: 10/15/1935 DOA: 05/17/2024 PCP: Amon Aloysius BRAVO, MD   Brief Narrative:   88 y.o. female with medical history significant of dementia, hypertension, hyperlipidemia, chronic systolic and diastolic heart failure presented with worsening confusion.  On presentation, patient was mildly tachycardic and tachypneic with normal WBCs, renal function and chest x-ray.  UA suggestive of UTI.  She was started on IV antibiotics.  Assessment & Plan:   Acute metabolic encephalopathy - Possibly from UTI.  Recent CT head on 05/13/2024 was negative for acute intracranial abnormality. - Continue IV antibiotics.  If no improvement in mental status, will need MRI of brain - Fall precautions.  PT/OT/SLP evaluation.  Monitor mental status   UTI: Present on admission - Continue Rocephin .  Follow cultures.   History of dementia - Resume home regimen once verified   Hypertension - Blood pressure on the higher side.  Resume losartan .  Monitor blood pressure.  Hyperlipidemia -Resume simvastatin  once home regimen is verified   DVT prophylaxis: Lovenox  Code Status: DNR. Confirmed by the daughter at bedside Family Communication: Daughter at bedside on 05/17/2024 Disposition Plan: Status is: Observation The patient will require care spanning > 2 midnights and should be moved to inpatient because: Of severity of illness.  Mental status still not back to baseline yet.  Will need IV antibiotics.    Consultants: None  Procedures: None  Antimicrobials: Rocephin  from 05/17/2024 onwards   Subjective: Patient seen and examined at bedside.  No fever, agitation, seizures reported.  Objective: Vitals:   05/17/24 2021 05/17/24 2045 05/18/24 0157 05/18/24 0521  BP: (!) 146/86 (!) 145/91 (!) 135/92 (!) 147/90  Pulse: (!) 110 (!) 109 98 92  Resp: (!) 23   20  Temp: 98 F (36.7 C) 98.2 F (36.8 C) 97.8 F (36.6 C) 97.7 F (36.5 C)  TempSrc:  Oral Oral Axillary   SpO2:  94% 96% 98%  Weight:    84.1 kg    Intake/Output Summary (Last 24 hours) at 05/18/2024 0744 Last data filed at 05/18/2024 0600 Gross per 24 hour  Intake 1926.52 ml  Output --  Net 1926.52 ml   Filed Weights   05/18/24 0521  Weight: 84.1 kg    Examination:  General exam: Appears calm and comfortable.  Chronically ill and deconditioned looking Respiratory system: Bilateral decreased breath sounds at bases, no wheezing Cardiovascular system: S1 & S2 heard, Rate controlled Gastrointestinal system: Abdomen is nondistended, soft and nontender. Normal bowel sounds heard. Extremities: No cyanosis, clubbing, edema  Central nervous system: Awake, still confused slightly.  Slow to respond.  Poor historian.  No focal neurological deficits. Moving extremities Skin: No rashes, lesions or ulcers Psychiatry: Mostly flat affect.  Currently not agitated.     Data Reviewed: I have personally reviewed following labs and imaging studies  CBC: Recent Labs  Lab 05/13/24 1225 05/17/24 1230 05/17/24 1316 05/18/24 0406  WBC 6.5 4.1  --  5.7  NEUTROABS 4.5 2.4  --   --   HGB 12.6 11.9* 11.6* 12.6  HCT 38.1 36.7 34.0* 37.6  MCV 94.1 96.8  --  93.8  PLT 217 199  --  210   Basic Metabolic Panel: Recent Labs  Lab 05/13/24 1225 05/17/24 1230 05/17/24 1316 05/18/24 0406  NA 142  --  143 143  K 3.9  --  3.8 3.9  CL 105  --  111 109  CO2 24  --   --  24  GLUCOSE  107*  --  95 107*  BUN 25*  --  39* 23  CREATININE 0.91  --  0.90 0.90  CALCIUM 10.5*  --   --  9.4  MG  --  2.2  --  2.1   GFR: Estimated Creatinine Clearance: 50 mL/min (by C-G formula based on SCr of 0.9 mg/dL). Liver Function Tests: Recent Labs  Lab 05/13/24 1225 05/18/24 0406  AST 18 27  ALT 14 31  ALKPHOS 57 44  BILITOT 0.4 0.6  PROT 7.6 6.5  ALBUMIN 4.9 3.9   Recent Labs  Lab 05/13/24 1225  LIPASE 31   Recent Labs  Lab 05/13/24 1225 05/18/24 0406  AMMONIA 17 20   Coagulation Profile: No results  for input(s): INR, PROTIME in the last 168 hours. Cardiac Enzymes: No results for input(s): CKTOTAL, CKMB, CKMBINDEX, TROPONINI in the last 168 hours. BNP (last 3 results) No results for input(s): PROBNP in the last 8760 hours. HbA1C: No results for input(s): HGBA1C in the last 72 hours. CBG: No results for input(s): GLUCAP in the last 168 hours. Lipid Profile: No results for input(s): CHOL, HDL, LDLCALC, TRIG, CHOLHDL, LDLDIRECT in the last 72 hours. Thyroid  Function Tests: Recent Labs    05/18/24 0406  TSH 3.275   Anemia Panel: Recent Labs    05/18/24 0406  VITAMINB12 431  FOLATE >20.0   Sepsis Labs: Recent Labs  Lab 05/17/24 1316  LATICACIDVEN 0.5    Recent Results (from the past 240 hours)  Resp panel by RT-PCR (RSV, Flu A&B, Covid) Anterior Nasal Swab     Status: None   Collection Time: 05/13/24 12:25 PM   Specimen: Anterior Nasal Swab  Result Value Ref Range Status   SARS Coronavirus 2 by RT PCR NEGATIVE NEGATIVE Final    Comment: (NOTE) SARS-CoV-2 target nucleic acids are NOT DETECTED.  The SARS-CoV-2 RNA is generally detectable in upper respiratory specimens during the acute phase of infection. The lowest concentration of SARS-CoV-2 viral copies this assay can detect is 138 copies/mL. A negative result does not preclude SARS-Cov-2 infection and should not be used as the sole basis for treatment or other patient management decisions. A negative result may occur with  improper specimen collection/handling, submission of specimen other than nasopharyngeal swab, presence of viral mutation(s) within the areas targeted by this assay, and inadequate number of viral copies(<138 copies/mL). A negative result must be combined with clinical observations, patient history, and epidemiological information. The expected result is Negative.  Fact Sheet for Patients:  BloggerCourse.com  Fact Sheet for Healthcare  Providers:  SeriousBroker.it  This test is no t yet approved or cleared by the [place]  FDA and  has been authorized for detection and/or diagnosis of SARS-CoV-2 by FDA under an Emergency Use Authorization (EUA). This EUA will remain  in effect (meaning this test can be used) for the duration of the COVID-19 declaration under Section 564(b)(1) of the Act, 21 U.S.C.section 360bbb-3(b)(1), unless the authorization is terminated  or revoked sooner.       Influenza A by PCR NEGATIVE NEGATIVE Final   Influenza B by PCR NEGATIVE NEGATIVE Final    Comment: (NOTE) The Xpert Xpress SARS-CoV-2/FLU/RSV plus assay is intended as an aid in the diagnosis of influenza from Nasopharyngeal swab specimens and should not be used as a sole basis for treatment. Nasal washings and aspirates are unacceptable for Xpert Xpress SARS-CoV-2/FLU/RSV testing.  Fact Sheet for Patients: BloggerCourse.com  Fact Sheet for Healthcare Providers: SeriousBroker.it  This test is not  yet approved or cleared by the [place]  FDA and has been authorized for detection and/or diagnosis of SARS-CoV-2 by FDA under an Emergency Use Authorization (EUA). This EUA will remain in effect (meaning this test can be used) for the duration of the COVID-19 declaration under Section 564(b)(1) of the Act, 21 U.S.C. section 360bbb-3(b)(1), unless the authorization is terminated or revoked.     Resp Syncytial Virus by PCR NEGATIVE NEGATIVE Final    Comment: (NOTE) Fact Sheet for Patients: BloggerCourse.com  Fact Sheet for Healthcare Providers: SeriousBroker.it  This test is not yet approved or cleared by the [place]  FDA and has been authorized for detection and/or diagnosis of SARS-CoV-2 by FDA under an Emergency Use Authorization (EUA). This EUA will remain in effect (meaning this test can be  used) for the duration of the COVID-19 declaration under Section 564(b)(1) of the Act, 21 U.S.C. section 360bbb-3(b)(1), unless the authorization is terminated or revoked.  Performed at [facility], [address], [place], [place] [address]   Resp panel by RT-PCR (RSV, Flu A&B, Covid) Urine, Clean Catch     Status: None   Collection Time: 05/17/24 12:30 PM   Specimen: Urine, Clean Catch; Nasal Swab  Result Value Ref Range Status   SARS Coronavirus 2 by RT PCR NEGATIVE NEGATIVE Final   Influenza A by PCR NEGATIVE NEGATIVE Final   Influenza B by PCR NEGATIVE NEGATIVE Final    Comment: (NOTE) The Xpert Xpress SARS-CoV-2/FLU/RSV plus assay is intended as an aid in the diagnosis of influenza from Nasopharyngeal swab specimens and should not be used as a sole basis for treatment. Nasal washings and aspirates are unacceptable for Xpert Xpress SARS-CoV-2/FLU/RSV testing.  Fact Sheet for Patients: BloggerCourse.com  Fact Sheet for Healthcare Providers: SeriousBroker.it  This test is not yet approved or cleared by the [place]  FDA and has been authorized for detection and/or diagnosis of SARS-CoV-2 by FDA under an Emergency Use Authorization (EUA). This EUA will remain in effect (meaning this test can be used) for the duration of the COVID-19 declaration under Section 564(b)(1) of the Act, 21 U.S.C. section 360bbb-3(b)(1), unless the authorization is terminated or revoked.     Resp Syncytial Virus by PCR NEGATIVE NEGATIVE Final    Comment: (NOTE) Fact Sheet for Patients: BloggerCourse.com  Fact Sheet for Healthcare Providers: SeriousBroker.it  This test is not yet approved or cleared by the [place]  FDA and has been authorized for detection and/or diagnosis of SARS-CoV-2 by FDA under an Emergency Use Authorization (EUA). This EUA will remain in effect  (meaning this test can be used) for the duration of the COVID-19 declaration under Section 564(b)(1) of the Act, 21 U.S.C. section 360bbb-3(b)(1), unless the authorization is terminated or revoked.  Performed at [facility], [address] [place], [place] [address]   Culture, blood (routine x 2)     Status: None (Preliminary result)   Collection Time: 05/17/24 12:31 PM   Specimen: BLOOD  Result Value Ref Range Status   Specimen Description BLOOD RIGHT ANTECUBITAL  Final   Special Requests   Final    BOTTLES DRAWN AEROBIC AND ANAEROBIC Blood Culture results may not be optimal due to an inadequate volume of blood received in culture bottles   Culture   Final    NO GROWTH < 24 HOURS Performed at [facility], [address], [place], [place] [address]    Report Status PENDING  Incomplete  Culture, blood (routine x 2)  Status: None (Preliminary result)   Collection Time: 05/17/24  2:20 PM   Specimen: BLOOD  Result Value Ref Range Status   Specimen Description BLOOD LEFT ANTECUBITAL  Final   Special Requests   Final    BOTTLES DRAWN AEROBIC AND ANAEROBIC Blood Culture results may not be optimal due to an inadequate volume of blood received in culture bottles   Culture   Final    NO GROWTH < 24 HOURS Performed at [facility], [address], [place], [place] [address]    Report Status PENDING  Incomplete         Radiology Studies: DG Chest Portable 1 View Result Date: 05/17/2024 CLINICAL DATA:  Altered mental status.  Concern for pneumonia. EXAM: PORTABLE CHEST 1 VIEW COMPARISON:  Chest radiograph dated 05/13/2024. FINDINGS: No focal consolidation, pleural effusion, or pneumothorax. Stable cardiac silhouette. Atherosclerotic calcification of the aorta. No acute osseous pathology. IMPRESSION: No active disease. Electronically Signed   By: Vanetta Chou M.D.   On: 05/17/2024 13:22        Scheduled Meds:  enoxaparin  (LOVENOX ) injection  40 mg  Subcutaneous Q24H   Continuous Infusions:  sodium chloride  100 mL/hr at 05/18/24 0354   cefTRIAXone  (ROCEPHIN )  IV     sodium chloride  250 mL/hr at 05/18/24 0000          Sophie Mao, MD [facility] 05/18/2024, 7:44 AM

## 2024-05-18 NOTE — Progress Notes (Signed)
 RE: [name] Date of Birth: 11-29-1935 Date: 05/19/2024   To Whom It May Concern:  Please be advised that the above-named patient will require a short-term nursing home stay - anticipated 30 days or less for rehabilitation and strengthening.  The plan is for return home.                 MD signature                Date

## 2024-05-18 NOTE — Plan of Care (Signed)
   Problem: Safety: Goal: Ability to remain free from injury will improve Outcome: Progressing

## 2024-05-18 NOTE — Care Management Obs Status (Signed)
 MEDICARE OBSERVATION STATUS NOTIFICATION   Patient Details  Name: [name] MRN: [id number] Date of Birth: Feb 08, 1936   Medicare Observation Status Notification Given:  Yes  Verbally reviewed observation notice with [name]  telephonically at [phone].  Copy will be left in the patients room per family member request      Claretta Deed 05/18/2024, 1:20 PM

## 2024-05-18 NOTE — Evaluation (Signed)
 Clinical/Bedside Swallow Evaluation Patient Details  Name: TEDDIE CURD MRN: [id number] Date of Birth: [birth year]  Today's Date: 05/18/2024 Time: SLP Start Time (ACUTE ONLY): 9070 SLP Stop Time (ACUTE ONLY): 0934 SLP Time Calculation (min) (ACUTE ONLY): 5 min  Past Medical History:  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   Depression    GERD (gastroesophageal reflux disease)    History of gastroesophageal reflux (GERD)    Hx of adenomatous colonic polyps    Hx of cardiac cath 2006   neg   Hypertension    Mixed urge and stress incontinence    OAB (overactive bladder) 07/13/2012   Osteoarthritis    Osteopenia    rx fosamax  08-2013   Personal history of colonic polyps - adenomas 04/19/2014   Past Surgical History:  Past Surgical History:  Procedure Laterality Date   ABDOMINAL HYSTERECTOMY  1971   APPENDECTOMY  1971   (at time of hyst?)   BLADDER SURGERY  2003   CATARACT EXTRACTION, BILATERAL     CHOLECYSTECTOMY     OOPHORECTOMY  1971   ROTATOR CUFF REPAIR Right    TOE SURGERY  1995   R 2nd --- correction of claw toe    HPI:  [name] is a 88 y.o. female who presented with altered mental status. Patient seen in [facility] ED on 8/30 for similar presentation; was thought to be dehydrated, treated empirically with IV fluids and discharged home.  Over the last 4 days, daughter has noticed that patient has become more confused, beyond her baseline. CXR 9/3 with no acute findings.  Head CT and CXR from 8/30 visit with no acute findings. Pt with medical history significant of dementia, hypertension, hyperlipidemia, chronic systolic and diastolic heart failure.    Assessment / Plan / Recommendation  Clinical Impression  Pt presents with grossly normal  swallowing as assessed clinically.  Pt tolerated all consistencies trialed, including serial straw sips of thin liquid, without any clinical s/s of aspiration. She exhibited excellent oral clearance of solid textures.  Pt denies any difficulties.  Chest imaging is clear.  She denies hx pna.    Recommend continuing regular texture diet with thin liquids.   SLP Visit Diagnosis: Dysphagia, unspecified (R13.10)    Aspiration Risk  No limitations    Diet Recommendation Regular;Thin liquid    Liquid Administration via: Cup;Straw Medication Administration: Whole meds with liquid Supervision: Patient able to self feed Compensations: Slow rate;Small sips/bites Postural Changes: Seated upright at 90 degrees    Other  Recommendations Oral Care Recommendations: Oral care BID     Assistance Recommended at Discharge    Functional Status Assessment Patient has not had a recent decline in their functional status  Frequency and Duration  (N/A)          Prognosis Prognosis for improved oropharyngeal function:  (N/A)      Swallow Study   General Date of Onset: 05/18/24 HPI: [name] is a 88 y.o. female who presented with altered mental status. Patient seen in [facility] ED on 8/30 for similar presentation; was thought to be dehydrated, treated empirically with IV fluids and discharged home.  Over the last 4 days, daughter has noticed that patient has become more confused, beyond  her baseline. CXR 9/3 with no acute findings.  Head CT and CXR from 8/30 visit with no acute findings. Pt with medical history significant of dementia, hypertension, hyperlipidemia, chronic systolic and diastolic heart failure. Type of Study: Bedside Swallow Evaluation Previous Swallow Assessment: none Diet Prior to this Study: Regular;Thin liquids (Level 0) Temperature Spikes Noted: No Respiratory Status: Room air History of Recent Intubation: No Behavior/Cognition: Alert;Cooperative;Pleasant mood Oral Cavity  Assessment: Within Functional Limits Oral Care Completed by SLP: No Oral Cavity - Dentition: Adequate natural dentition Vision: Functional for self-feeding Self-Feeding Abilities: Able to feed self Patient Positioning: Upright in bed Baseline Vocal Quality: Normal Volitional Cough:  (Fair) Volitional Swallow: Able to elicit    Oral/Motor/Sensory Function Overall Oral Motor/Sensory Function: Within functional limits   Ice Chips Ice chips: Not tested   Thin Liquid Thin Liquid: Within functional limits Presentation: Straw    Nectar Thick Nectar Thick Liquid: Not tested   Honey Thick Honey Thick Liquid: Not tested   Puree Puree: Within functional limits Presentation: Self Fed   Solid     Solid: Within functional limits Presentation: Self Fed      Anette FORBES Grippe, MA, CCC-SLP Acute Rehabilitation Services Office: [phone] 05/18/2024,9:41 AM

## 2024-05-18 NOTE — Evaluation (Signed)
 Occupational Therapy Evaluation Patient Details Name: [name] MRN: [id number] DOB: Jul 30, 1936 Today's Date: 05/18/2024   History of Present Illness   88 y.o. presented 9/3 with worsening confusion. UA suggestive of UTI. PMH includes chronic systolic and diastolic heart failure, HLD, HTN and dementia.     Clinical Impressions Pt is a resident of [facility], reports walking with a rollator and is independent in sponge bathing and dressing. The facility provides meals and housekeeping. Her daughter assists with transportation and IADLs intermittently. Pt presents with impaired cognition (memory and direction following), generalized weakness and poor standing balance with posterior bias upon initially standing. Pt needs min assist for mobility and set up to moderate assistance for ADLs. Patient will benefit from continued inpatient follow up therapy, <3 hours/day.     If plan is discharge home, recommend the following:   A little help with walking and/or transfers;A little help with bathing/dressing/bathroom;Assistance with cooking/housework;Direct supervision/assist for medications management;Direct supervision/assist for financial management;Assist for transportation     Functional Status Assessment   Patient has had a recent decline in their functional status and demonstrates the ability to make significant improvements in function in a reasonable and predictable amount of time.     Equipment Recommendations   None recommended by OT     Recommendations for Other Services         Precautions/Restrictions   Precautions Precautions: Fall Recall of Precautions/Restrictions: Impaired Restrictions Weight Bearing Restrictions Per Provider Order: No     Mobility Bed Mobility Overal bed mobility: Needs Assistance Bed Mobility: Supine to Sit, Sit to Supine     Supine to sit: Min assist Sit to supine: Min assist   General bed mobility comments: cues for technique, pulled  up trunk with therapist's hand, min asist for LEs back into bed    Transfers Overall transfer level: Needs assistance Equipment used: Rolling walker (2 wheels) Transfers: Sit to/from Stand Sit to Stand: Min assist           General transfer comment: assist to rise and steady, initially with posterior bias and stabilizing back of LEs on bed      Balance Overall balance assessment: Needs assistance   Sitting balance-Leahy Scale: Fair     Standing balance support: Bilateral upper extremity supported Standing balance-Leahy Scale: Poor                             ADL either performed or assessed with clinical judgement   ADL Overall ADL's : Needs assistance/impaired Eating/Feeding: Independent;Sitting   Grooming: Minimal assistance;Standing;Wash/dry hands   Upper Body Bathing: Set up;Sitting   Lower Body Bathing: Sit to/from stand;Moderate assistance   Upper Body Dressing : Set up;Sitting   Lower Body Dressing: Sit to/from stand;Moderate assistance   Toilet Transfer: Minimal assistance;Ambulation;Rolling walker (2 wheels)   Toileting- Clothing Manipulation and Hygiene: Minimal assistance;Sit to/from stand       Functional mobility during ADLs: Minimal assistance;Rolling walker (2 wheels)       Vision Baseline Vision/History: 1 Wears glasses Ability to See in Adequate Light: 0 Adequate Patient Visual Report: No change from baseline       Perception         Praxis         Pertinent Vitals/Pain Pain Assessment Pain Assessment: No/denies pain     Extremity/Trunk Assessment Upper Extremity Assessment Upper Extremity Assessment: Generalized weakness   Lower Extremity Assessment Lower Extremity Assessment: Defer to PT evaluation  Communication Communication Communication: Impaired Factors Affecting Communication: Hearing impaired   Cognition Arousal: Alert Behavior During Therapy: WFL for tasks assessed/performed Cognition: No  family/caregiver present to determine baseline             OT - Cognition Comments: pt with history of dementia, admitted with AMS                 Following commands: Impaired Following commands impaired: Only follows one step commands consistently, Follows one step commands inconsistently     Cueing  General Comments   Cueing Techniques: Verbal cues;Gestural cues;Tactile cues      Exercises     Shoulder Instructions      Home Living Family/patient expects to be discharged to:: Private residence Living Arrangements: Alone Available Help at Discharge: Family;Available PRN/intermittently Type of Home: Independent living facility Home Access: Level entry;Elevator     Home Layout: One level     Bathroom Shower/Tub: Producer, television/film/video: Handicapped height     Home Equipment: Grab bars - toilet;Cane - single point;Educational psychologist (4 wheels)          Prior Functioning/Environment Prior Level of Function : Needs assist             Mobility Comments: walks with a rollator ADLs Comments: sponge bathes, dresses herself, facility provides meals and housekeeping    OT Problem List: Decreased strength;Impaired balance (sitting and/or standing);Decreased cognition   OT Treatment/Interventions: Self-care/ADL training;DME and/or AE instruction;Therapeutic activities;Cognitive remediation/compensation;Patient/family education;Balance training      OT Goals(Current goals can be found in the care plan section)   Acute Rehab OT Goals OT Goal Formulation: With patient Time For Goal Achievement: 06/01/24 Potential to Achieve Goals: Good ADL Goals Pt Will Perform Grooming: with supervision;standing Pt Will Perform Lower Body Bathing: with supervision;sit to/from stand Pt Will Perform Lower Body Dressing: with supervision;sit to/from stand Pt Will Transfer to Toilet: with supervision;ambulating Pt Will Perform Toileting - Clothing Manipulation  and hygiene: with supervision;sit to/from stand Additional ADL Goal #1: Pt will complete bed mobility mod I in preparation for ADLs.   OT Frequency:  Min 2X/week    Co-evaluation              AM-PAC OT 6 Clicks Daily Activity     Outcome Measure Help from another person eating meals?: None Help from another person taking care of personal grooming?: A Little Help from another person toileting, which includes using toliet, bedpan, or urinal?: A Little Help from another person bathing (including washing, rinsing, drying)?: A Little Help from another person to put on and taking off regular upper body clothing?: A Little Help from another person to put on and taking off regular lower body clothing?: A Little 6 Click Score: 19   End of Session Equipment Utilized During Treatment: Gait belt;Rolling walker (2 wheels)  Activity Tolerance: Patient tolerated treatment well Patient left: in bed;with call bell/phone within reach;with bed alarm set  OT Visit Diagnosis: Unsteadiness on feet (R26.81);Other abnormalities of gait and mobility (R26.89);Muscle weakness (generalized) (M62.81);Other symptoms and signs involving cognitive function                Time: 1251-1305 OT Time Calculation (min): 14 min Charges:  OT General Charges $OT Visit: 1 Visit OT Evaluation $OT Eval Moderate Complexity: 1 Mod  [name], OTR/L Acute Rehabilitation Services Office: [phone]   [name] 05/18/2024, 1:50 PM

## 2024-05-19 ENCOUNTER — Encounter (HOSPITAL_COMMUNITY): Payer: Self-pay | Admitting: Internal Medicine

## 2024-05-19 DIAGNOSIS — M6281 Muscle weakness (generalized): Secondary | ICD-10-CM | POA: Diagnosis not present

## 2024-05-19 DIAGNOSIS — E785 Hyperlipidemia, unspecified: Secondary | ICD-10-CM | POA: Diagnosis not present

## 2024-05-19 DIAGNOSIS — R2681 Unsteadiness on feet: Secondary | ICD-10-CM | POA: Diagnosis not present

## 2024-05-19 DIAGNOSIS — G9340 Encephalopathy, unspecified: Secondary | ICD-10-CM | POA: Diagnosis not present

## 2024-05-19 DIAGNOSIS — N390 Urinary tract infection, site not specified: Secondary | ICD-10-CM | POA: Diagnosis not present

## 2024-05-19 DIAGNOSIS — G9341 Metabolic encephalopathy: Secondary | ICD-10-CM | POA: Diagnosis not present

## 2024-05-19 DIAGNOSIS — R2689 Other abnormalities of gait and mobility: Secondary | ICD-10-CM | POA: Diagnosis not present

## 2024-05-19 DIAGNOSIS — Z85828 Personal history of other malignant neoplasm of skin: Secondary | ICD-10-CM | POA: Diagnosis not present

## 2024-05-19 DIAGNOSIS — I110 Hypertensive heart disease with heart failure: Secondary | ICD-10-CM | POA: Diagnosis not present

## 2024-05-19 DIAGNOSIS — I5042 Chronic combined systolic (congestive) and diastolic (congestive) heart failure: Secondary | ICD-10-CM | POA: Diagnosis not present

## 2024-05-19 LAB — GLUCOSE, CAPILLARY
Glucose-Capillary: 76 mg/dL (ref 70–99)
Glucose-Capillary: 128 mg/dL — ABNORMAL HIGH (ref 70–99)
Glucose-Capillary: 95 mg/dL (ref 70–99)
Glucose-Capillary: 98 mg/dL (ref 70–99)

## 2024-05-19 LAB — MRSA NEXT GEN BY PCR, NASAL: MRSA by PCR Next Gen: NOT DETECTED

## 2024-05-19 MED ORDER — VENLAFAXINE HCL ER 75 MG PO CP24
75.0000 mg | ORAL_CAPSULE | Freq: Every day | ORAL | Status: DC
  Administered 2024-05-19 – 2024-05-25 (×7): 75 mg via ORAL
  Filled 2024-05-19 (×7): qty 1

## 2024-05-19 NOTE — Plan of Care (Signed)
   Problem: Safety: Goal: Ability to remain free from injury will improve Outcome: Progressing

## 2024-05-19 NOTE — Progress Notes (Signed)
 Notified provider about patient being anxious. Patient continues to need redirection about place. Patients continuous to get out of chair without supervision. Patient daughter called to speak with patient. MD resumed Effexor  and a telesitter was ordered.

## 2024-05-19 NOTE — TOC Progression Note (Addendum)
 Transition of Care [facility]) - Progression Note    Patient Details  Name: [name] MRN: [id number] Date of Birth: [birth year]  [facility] of [facility]) CM/SW Contact  Lendia Dais, [place] Phone Number: 05/19/2024, 3:35 PM  Clinical Narrative:  Pt is oriented x1.  CSW spoke to daughter Jenkins via phone. CSW asked if the medicare list could be emailed and Jenkins was agreeable. CSW informed Jenkins of bed offers and Jenkins expressed hesitancy of the pt transitioning to a SNF. Jenkins stated that when the pt was at [facility] she did not notice much of a difference with her independence.  Ann continued to explained the pt's mental status has declined and how she is unable to provide the care that the pt needs if she does not progress. CSW stated understanding and explained that if she unable to provide the care that pt needs, then a skilled nursing facility would be the safest option. Ann stated understanding and said they would follow up with the MD.  Medicare list was emailed to [email or web address] and Ann expressed gratitude.  PASSR is still pending due to [facility] having a hx of anxiety and the H&P does not. CSW secure messaged MD and pending addendum.   ICM (Inpatient Care Management) will continue to follow.    Expected Discharge Plan: Skilled Nursing Facility Barriers to Discharge: Continued Medical Work up               Expected Discharge Plan and Services In-house Referral: Clinical Social Work     Living arrangements for the past 2 months: Independent Living Facility                                       Social Drivers of Health (SDOH) Interventions SDOH Screenings   Food Insecurity: No Food Insecurity (05/18/2024)  Housing: Low Risk  (05/18/2024)  Transportation Needs: No Transportation Needs (05/18/2024)  Utilities: Not At Risk (05/18/2024)  Alcohol Screen: Low Risk  (08/19/2023)  Depression (PHQ2-9): Low Risk  (08/19/2023)  Financial Resource Strain: Low Risk   (08/19/2023)  Physical Activity: Inactive (08/19/2023)  Social Connections: Moderately Integrated (05/18/2024)  Stress: No Stress Concern Present (08/19/2023)  Tobacco Use: Low Risk  (01/16/2024)  Health Literacy: Adequate Health Literacy (08/19/2023)    Readmission Risk Interventions     No data to display

## 2024-05-19 NOTE — Progress Notes (Addendum)
 PROGRESS NOTE    [name]  FMW:[id number] DOB: Oct 31, 1935 DOA: 05/17/2024 PCP: Amon Aloysius BRAVO, MD   Brief Narrative:   88 y.o. female with medical history significant of dementia, hypertension, hyperlipidemia, chronic systolic and diastolic heart failure presented with worsening confusion.  On presentation, patient was mildly tachycardic and tachypneic with normal WBCs, renal function and chest x-ray.  UA suggestive of UTI.  She was started on IV antibiotics.  Assessment & Plan:   Acute metabolic encephalopathy - Possibly from UTI.  Recent CT head on 05/13/2024 was negative for acute intracranial abnormality. - Continue IV antibiotics.  If no improvement in mental status, will need MRI of brain - Fall precautions.   -PT recommending SNF placement.  TOC consulted.  Diet as per SLP recommendations - monitor mental status   UTI: Present on admission - Continue Rocephin .  Urine cultures grew less than 10,000 colonies per mL insignificant growth.    History of dementia with anxiety - Continue memantine . Resume Effexor   Hypertension - Blood pressure improving.  Continue metoprolol  for now.  Monitor blood pressure.  Hyperlipidemia - Continue simvastatin    DVT prophylaxis: Lovenox  Code Status: DNR.  Family Communication: Daughter at bedside on 05/17/2024 Disposition Plan: Status is: Observation The patient will require care spanning > 2 midnights and should be moved to inpatient because: Of severity of illness.  Need for SNF placement.  Still on IV antibiotics.  Currently medically stable for discharge to SNF.   Consultants: None  Procedures: None  Antimicrobials: Rocephin  from 05/17/2024 onwards   Subjective: Patient seen and examined at bedside.  No seizures, fever, agitation or vomiting reported.  Objective: Vitals:   05/18/24 1717 05/18/24 1952 05/19/24 0436 05/19/24 0728  BP: 122/86 134/66 (!) 135/52 (!) 165/83  Pulse: 92 75 (!) 57 70  Resp:  17 20 18   Temp: 98.3 F  (36.8 C) 98.2 F (36.8 C) 98.1 F (36.7 C) 97.7 F (36.5 C)  TempSrc: Oral  Oral Oral  SpO2: 100% 97% 97% 97%  Weight:        Intake/Output Summary (Last 24 hours) at 05/19/2024 0837 Last data filed at 05/19/2024 0600 Gross per 24 hour  Intake 7555.88 ml  Output 1950 ml  Net 5605.88 ml   Filed Weights   05/18/24 0521  Weight: 84.1 kg    Examination:  General: On room air.  No distress ENT/neck: No thyromegaly.  JVD is not elevated  respiratory: Decreased breath sounds at bases bilaterally with some crackles; no wheezing  CVS: S1-S2 heard, rate controlled currently Abdominal: Soft, nontender, slightly distended; no organomegaly, bowel sounds are heard Extremities: Trace lower extremity edema; no cyanosis  CNS: Awake, slow to respond and continues to be slightly confused.  Poor historian.  No focal neurologic deficit.  Moves extremities Lymph: No obvious lymphadenopathy Skin: No obvious ecchymosis/lesions  psych: Flat affect currently.  No signs of agitation musculoskeletal: No obvious joint swelling/deformity     Data Reviewed: I have personally reviewed following labs and imaging studies  CBC: Recent Labs  Lab 05/13/24 1225 05/17/24 1230 05/17/24 1316 05/18/24 0406  WBC 6.5 4.1  --  5.7  NEUTROABS 4.5 2.4  --   --   HGB 12.6 11.9* 11.6* 12.6  HCT 38.1 36.7 34.0* 37.6  MCV 94.1 96.8  --  93.8  PLT 217 199  --  210   Basic Metabolic Panel: Recent Labs  Lab 05/13/24 1225 05/17/24 1230 05/17/24 1316 05/18/24 0406  NA 142  --  143  143  K 3.9  --  3.8 3.9  CL 105  --  111 109  CO2 24  --   --  24  GLUCOSE 107*  --  95 107*  BUN 25*  --  39* 23  CREATININE 0.91  --  0.90 0.90  CALCIUM 10.5*  --   --  9.4  MG  --  2.2  --  2.1   GFR: Estimated Creatinine Clearance: 50 mL/min (by C-G formula based on SCr of 0.9 mg/dL). Liver Function Tests: Recent Labs  Lab 05/13/24 1225 05/18/24 0406  AST 18 27  ALT 14 31  ALKPHOS 57 44  BILITOT 0.4 0.6  PROT 7.6  6.5  ALBUMIN 4.9 3.9   Recent Labs  Lab 05/13/24 1225  LIPASE 31   Recent Labs  Lab 05/13/24 1225 05/18/24 0406  AMMONIA 17 20   Coagulation Profile: No results for input(s): INR, PROTIME in the last 168 hours. Cardiac Enzymes: No results for input(s): CKTOTAL, CKMB, CKMBINDEX, TROPONINI in the last 168 hours. BNP (last 3 results) No results for input(s): PROBNP in the last 8760 hours. HbA1C: No results for input(s): HGBA1C in the last 72 hours. CBG: Recent Labs  Lab 05/19/24 0725  GLUCAP 76   Lipid Profile: No results for input(s): CHOL, HDL, LDLCALC, TRIG, CHOLHDL, LDLDIRECT in the last 72 hours. Thyroid  Function Tests: Recent Labs    05/18/24 0406  TSH 3.275   Anemia Panel: Recent Labs    05/18/24 0406  VITAMINB12 431  FOLATE >20.0   Sepsis Labs: Recent Labs  Lab 05/17/24 1316  LATICACIDVEN 0.5    Recent Results (from the past 240 hours)  Resp panel by RT-PCR (RSV, Flu A&B, Covid) Anterior Nasal Swab     Status: None   Collection Time: 05/13/24 12:25 PM   Specimen: Anterior Nasal Swab  Result Value Ref Range Status   SARS Coronavirus 2 by RT PCR NEGATIVE NEGATIVE Final    Comment: (NOTE) SARS-CoV-2 target nucleic acids are NOT DETECTED.  The SARS-CoV-2 RNA is generally detectable in upper respiratory specimens during the acute phase of infection. The lowest concentration of SARS-CoV-2 viral copies this assay can detect is 138 copies/mL. A negative result does not preclude SARS-Cov-2 infection and should not be used as the sole basis for treatment or other patient management decisions. A negative result may occur with  improper specimen collection/handling, submission of specimen other than nasopharyngeal swab, presence of viral mutation(s) within the areas targeted by this assay, and inadequate number of viral copies(<138 copies/mL). A negative result must be combined with clinical observations, patient history, and  epidemiological information. The expected result is Negative.  Fact Sheet for Patients:  BloggerCourse.com  Fact Sheet for Healthcare Providers:  SeriousBroker.it  This test is no t yet approved or cleared by the [place]  FDA and  has been authorized for detection and/or diagnosis of SARS-CoV-2 by FDA under an Emergency Use Authorization (EUA). This EUA will remain  in effect (meaning this test can be used) for the duration of the COVID-19 declaration under Section 564(b)(1) of the Act, 21 U.S.C.section 360bbb-3(b)(1), unless the authorization is terminated  or revoked sooner.       Influenza A by PCR NEGATIVE NEGATIVE Final   Influenza B by PCR NEGATIVE NEGATIVE Final    Comment: (NOTE) The Xpert Xpress SARS-CoV-2/FLU/RSV plus assay is intended as an aid in the diagnosis of influenza from Nasopharyngeal swab specimens and should not be used as a sole basis for  treatment. Nasal washings and aspirates are unacceptable for Xpert Xpress SARS-CoV-2/FLU/RSV testing.  Fact Sheet for Patients: BloggerCourse.com  Fact Sheet for Healthcare Providers: SeriousBroker.it  This test is not yet approved or cleared by the [place]  FDA and has been authorized for detection and/or diagnosis of SARS-CoV-2 by FDA under an Emergency Use Authorization (EUA). This EUA will remain in effect (meaning this test can be used) for the duration of the COVID-19 declaration under Section 564(b)(1) of the Act, 21 U.S.C. section 360bbb-3(b)(1), unless the authorization is terminated or revoked.     Resp Syncytial Virus by PCR NEGATIVE NEGATIVE Final    Comment: (NOTE) Fact Sheet for Patients: BloggerCourse.com  Fact Sheet for Healthcare Providers: SeriousBroker.it  This test is not yet approved or cleared by the [place]  FDA and has been  authorized for detection and/or diagnosis of SARS-CoV-2 by FDA under an Emergency Use Authorization (EUA). This EUA will remain in effect (meaning this test can be used) for the duration of the COVID-19 declaration under Section 564(b)(1) of the Act, 21 U.S.C. section 360bbb-3(b)(1), unless the authorization is terminated or revoked.  Performed at [facility], [address], [place], [place] [address]   Urine Culture     Status: Abnormal   Collection Time: 05/17/24 12:29 PM   Specimen: Urine, Clean Catch  Result Value Ref Range Status   Specimen Description URINE, CLEAN CATCH  Final   Special Requests NONE  Final   Culture (A)  Final    <10,000 COLONIES/mL INSIGNIFICANT GROWTH Performed at [facility], [address] [place], [place] [address]    Report Status 05/18/2024 FINAL  Final  Resp panel by RT-PCR (RSV, Flu A&B, Covid) Urine, Clean Catch     Status: None   Collection Time: 05/17/24 12:30 PM   Specimen: Urine, Clean Catch; Nasal Swab  Result Value Ref Range Status   SARS Coronavirus 2 by RT PCR NEGATIVE NEGATIVE Final   Influenza A by PCR NEGATIVE NEGATIVE Final   Influenza B by PCR NEGATIVE NEGATIVE Final    Comment: (NOTE) The Xpert Xpress SARS-CoV-2/FLU/RSV plus assay is intended as an aid in the diagnosis of influenza from Nasopharyngeal swab specimens and should not be used as a sole basis for treatment. Nasal washings and aspirates are unacceptable for Xpert Xpress SARS-CoV-2/FLU/RSV testing.  Fact Sheet for Patients: BloggerCourse.com  Fact Sheet for Healthcare Providers: SeriousBroker.it  This test is not yet approved or cleared by the [place]  FDA and has been authorized for detection and/or diagnosis of SARS-CoV-2 by FDA under an Emergency Use Authorization (EUA). This EUA will remain in effect (meaning this test can be used) for the duration of the COVID-19 declaration  under Section 564(b)(1) of the Act, 21 U.S.C. section 360bbb-3(b)(1), unless the authorization is terminated or revoked.     Resp Syncytial Virus by PCR NEGATIVE NEGATIVE Final    Comment: (NOTE) Fact Sheet for Patients: BloggerCourse.com  Fact Sheet for Healthcare Providers: SeriousBroker.it  This test is not yet approved or cleared by the [place]  FDA and has been authorized for detection and/or diagnosis of SARS-CoV-2 by FDA under an Emergency Use Authorization (EUA). This EUA will remain in effect (meaning this test can be used) for the duration of the COVID-19 declaration under Section 564(b)(1) of the Act, 21 U.S.C. section 360bbb-3(b)(1), unless the authorization is terminated or revoked.  Performed at [facility], [address], [place], [place] [address]   Culture, blood (routine x 2)  Status: None (Preliminary result)   Collection Time: 05/17/24 12:31 PM   Specimen: BLOOD  Result Value Ref Range Status   Specimen Description BLOOD RIGHT ANTECUBITAL  Final   Special Requests   Final    BOTTLES DRAWN AEROBIC AND ANAEROBIC Blood Culture results may not be optimal due to an inadequate volume of blood received in culture bottles   Culture   Final    NO GROWTH 2 DAYS Performed at [facility], [address] [place], [place] [address]    Report Status PENDING  Incomplete  Culture, blood (routine x 2)     Status: None (Preliminary result)   Collection Time: 05/17/24  2:20 PM   Specimen: BLOOD  Result Value Ref Range Status   Specimen Description BLOOD LEFT ANTECUBITAL  Final   Special Requests   Final    BOTTLES DRAWN AEROBIC AND ANAEROBIC Blood Culture results may not be optimal due to an inadequate volume of blood received in culture bottles   Culture   Final    NO GROWTH 2 DAYS Performed at [facility], [address] [place], [place] [address]    Report Status PENDING  Incomplete   MRSA Next Gen by PCR, Nasal     Status: None   Collection Time: 05/19/24  6:15 AM   Specimen: Nasal Mucosa; Nasal Swab  Result Value Ref Range Status   MRSA by PCR Next Gen NOT DETECTED NOT DETECTED Final    Comment: (NOTE) The GeneXpert MRSA Assay (FDA approved for NASAL specimens only), is one component of a comprehensive MRSA colonization surveillance program. It is not intended to diagnose MRSA infection nor to guide or monitor treatment for MRSA infections. Test performance is not FDA approved in patients less than 63 years old. Performed at [facility], [address], [place], [place] [address]          Radiology Studies: DG Chest Portable 1 View Result Date: 05/17/2024 CLINICAL DATA:  Altered mental status.  Concern for pneumonia. EXAM: PORTABLE CHEST 1 VIEW COMPARISON:  Chest radiograph dated 05/13/2024. FINDINGS: No focal consolidation, pleural effusion, or pneumothorax. Stable cardiac silhouette. Atherosclerotic calcification of the aorta. No acute osseous pathology. IMPRESSION: No active disease. Electronically Signed   By: Vanetta Chou M.D.   On: 05/17/2024 13:22        Scheduled Meds:  enoxaparin  (LOVENOX ) injection  40 mg Subcutaneous Q24H   memantine   5 mg Oral BID   metoprolol  tartrate  25 mg Oral BID   simvastatin   20 mg Oral q1800   Continuous Infusions:  sodium chloride  75 mL/hr at 05/19/24 0444   cefTRIAXone  (ROCEPHIN )  IV 1 g (05/19/24 0830)   sodium chloride  250 mL/hr at 05/18/24 1803          Sophie Mao, MD [facility] 05/19/2024, 8:37 AM

## 2024-05-19 NOTE — Progress Notes (Signed)
 Occupational Therapy Treatment Patient Details Name: [name] MRN: [id number] DOB: [birth year] Today's Date: 05/19/2024   History of present illness 88 y.o. presented 9/3 with worsening confusion. UA suggestive of UTI. PMH includes chronic systolic and diastolic heart failure, HLD, HTN and dementia.   OT comments  Pt is progressing well. Stands from chair and BSC over toilet with CGA and RW. Ambulating with CGA. Completing standing grooming at sink with CGA. Pt encouraged to use call button to ask for what she wants. Reports feeling cold, but had not reached out to staff for assistance. Patient will benefit from continued inpatient follow up therapy, <3 hours/day.       If plan is discharge home, recommend the following:  A little help with walking and/or transfers;A little help with bathing/dressing/bathroom;Assistance with cooking/housework;Direct supervision/assist for medications management;Direct supervision/assist for financial management;Assist for transportation   Equipment Recommendations  None recommended by OT    Recommendations for Other Services      Precautions / Restrictions Precautions Precautions: Fall Restrictions Weight Bearing Restrictions Per Provider Order: No       Mobility Bed Mobility               General bed mobility comments: in chair    Transfers Overall transfer level: Needs assistance Equipment used: Rolling walker (2 wheels) Transfers: Sit to/from Stand Sit to Stand: Contact guard assist           General transfer comment: slow to rise, no physical assist, cues for hand placement and to control descent to chair     Balance Overall balance assessment: Needs assistance   Sitting balance-Leahy Scale: Good       Standing balance-Leahy Scale: Fair Standing balance comment: can release walker in static standing                           ADL either performed or assessed with clinical judgement   ADL Overall ADL's :  Needs assistance/impaired     Grooming: Wash/dry hands;Brushing hair;Standing;Contact guard assist           Upper Body Dressing : Set up;Sitting       Toilet Transfer: Contact guard assist;Rolling walker (2 wheels);Ambulation   Toileting- Clothing Manipulation and Hygiene: Minimal assistance;Sit to/from stand       Functional mobility during ADLs: Contact guard assist;Rolling walker (2 wheels)      Extremity/Trunk Assessment              Vision       Perception     Praxis     Communication Communication Communication: Impaired Factors Affecting Communication: Hearing impaired   Cognition Arousal: Alert Behavior During Therapy: WFL for tasks assessed/performed Cognition: No family/caregiver present to determine baseline             OT - Cognition Comments: pt with history of dementia, disoriented to all but herself                 Following commands: Impaired Following commands impaired: Follows one step commands with increased time      Cueing   Cueing Techniques: Verbal cues  Exercises      Shoulder Instructions       General Comments      Pertinent Vitals/ Pain       Pain Assessment Pain Assessment: No/denies pain  Home Living  Prior Functioning/Environment              Frequency  Min 2X/week        Progress Toward Goals  OT Goals(current goals can now be found in the care plan section)  Progress towards OT goals: Progressing toward goals  Acute Rehab OT Goals OT Goal Formulation: With patient Time For Goal Achievement: 06/01/24 Potential to Achieve Goals: Good  Plan      Co-evaluation                 AM-PAC OT 6 Clicks Daily Activity     Outcome Measure   Help from another person eating meals?: None Help from another person taking care of personal grooming?: A Little Help from another person toileting, which includes using toliet,  bedpan, or urinal?: A Little Help from another person bathing (including washing, rinsing, drying)?: A Little Help from another person to put on and taking off regular upper body clothing?: A Little Help from another person to put on and taking off regular lower body clothing?: A Little 6 Click Score: 19    End of Session Equipment Utilized During Treatment: Gait belt;Rolling walker (2 wheels)  OT Visit Diagnosis: Unsteadiness on feet (R26.81);Other abnormalities of gait and mobility (R26.89);Muscle weakness (generalized) (M62.81);Other symptoms and signs involving cognitive function   Activity Tolerance Patient tolerated treatment well   Patient Left in chair;with call bell/phone within reach;with chair alarm set   Nurse Communication          Time: 8982-8953 OT Time Calculation (min): 29 min  Charges: OT General Charges $OT Visit: 1 Visit OT Treatments $Self Care/Home Management : 23-37 mins  [name], OTR/L Acute Rehabilitation Services Office: [phone]   [name] 05/19/2024, 10:53 AM

## 2024-05-20 DIAGNOSIS — I110 Hypertensive heart disease with heart failure: Secondary | ICD-10-CM | POA: Diagnosis not present

## 2024-05-20 DIAGNOSIS — M6281 Muscle weakness (generalized): Secondary | ICD-10-CM | POA: Diagnosis not present

## 2024-05-20 DIAGNOSIS — R2681 Unsteadiness on feet: Secondary | ICD-10-CM | POA: Diagnosis not present

## 2024-05-20 DIAGNOSIS — G9340 Encephalopathy, unspecified: Secondary | ICD-10-CM | POA: Diagnosis not present

## 2024-05-20 DIAGNOSIS — R2689 Other abnormalities of gait and mobility: Secondary | ICD-10-CM | POA: Diagnosis not present

## 2024-05-20 DIAGNOSIS — G9341 Metabolic encephalopathy: Secondary | ICD-10-CM | POA: Diagnosis not present

## 2024-05-20 DIAGNOSIS — E785 Hyperlipidemia, unspecified: Secondary | ICD-10-CM | POA: Diagnosis not present

## 2024-05-20 DIAGNOSIS — I5042 Chronic combined systolic (congestive) and diastolic (congestive) heart failure: Secondary | ICD-10-CM | POA: Diagnosis not present

## 2024-05-20 DIAGNOSIS — N390 Urinary tract infection, site not specified: Secondary | ICD-10-CM | POA: Diagnosis not present

## 2024-05-20 DIAGNOSIS — Z85828 Personal history of other malignant neoplasm of skin: Secondary | ICD-10-CM | POA: Diagnosis not present

## 2024-05-20 LAB — GLUCOSE, CAPILLARY
Glucose-Capillary: 114 mg/dL — ABNORMAL HIGH (ref 70–99)
Glucose-Capillary: 94 mg/dL (ref 70–99)
Glucose-Capillary: 114 mg/dL — ABNORMAL HIGH (ref 70–99)

## 2024-05-20 NOTE — Progress Notes (Signed)
 PROGRESS NOTE    [name]  FMW:[id number] DOB: September 03, 1936 DOA: 05/17/2024 PCP: Amon Aloysius BRAVO, MD   Brief Narrative:   88 y.o. female with medical history significant of dementia, hypertension, hyperlipidemia, chronic systolic and diastolic heart failure presented with worsening confusion.  On presentation, patient was mildly tachycardic and tachypneic with normal WBCs, renal function and chest x-ray.  UA suggestive of UTI.  She was started on IV antibiotics.  PT recommended SNF placement.  Currently medically stable for discharge to SNF.  Assessment & Plan:   Acute metabolic encephalopathy - Possibly from UTI.  Recent CT head on 05/13/2024 was negative for acute intracranial abnormality. - Continue IV antibiotics.   - Fall precautions.  Mental status improving.  Possibly back to baseline. -PT recommending SNF placement.  TOC following.  Diet as per SLP recommendations - monitor mental status   UTI: Present on admission - Continue Rocephin .  Finish 7-day course of therapy.  Urine cultures grew less than 10,000 colonies per mL insignificant growth.    History of dementia with anxiety - Continue memantine .  Continue Effexor   Hypertension - Blood pressure improving.  Continue metoprolol  for now.  Monitor blood pressure.  Hyperlipidemia - Continue simvastatin    DVT prophylaxis: Lovenox  Code Status: DNR.  Family Communication: Daughter at bedside on 05/17/2024 Disposition Plan: Status is: Observation The patient will require care spanning > 2 midnights and should be moved to inpatient because: Of severity of illness.  Need for SNF placement.  Still on IV antibiotics.  Currently medically stable for discharge to SNF.   Consultants: None  Procedures: None  Antimicrobials: Rocephin  from 05/17/2024 onwards   Subjective: Patient seen and examined at bedside.  No agitation, fever or seizures reported. Objective: Vitals:   05/19/24 0728 05/19/24 1630 05/19/24 1933 05/20/24 0441  BP:  (!) 165/83 (!) 143/83 (!) 143/69 (!) 160/74  Pulse: 70 81 63 72  Resp: 18 18 18 18   Temp: 97.7 F (36.5 C) 98 F (36.7 C) 98.2 F (36.8 C) 97.7 F (36.5 C)  TempSrc: Oral  Oral Oral  SpO2: 97% 100% 98% 98%  Weight:        Intake/Output Summary (Last 24 hours) at 05/20/2024 0835 Last data filed at 05/20/2024 0600 Gross per 24 hour  Intake 1459.65 ml  Output 2500 ml  Net -1040.35 ml   Filed Weights   05/18/24 0521  Weight: 84.1 kg    Examination:  General: Remains on room air and in no distress.  Still slightly confused.  Slow to respond. respiratory: Bilateral decreased breath sounds at bases with scattered crackles CVS: Rate mostly controlled; S1 and S2 are heard  abdominal: Soft, nontender, distended mildly; no organomegaly, bowel sounds are heard normally Extremities: No clubbing; mild lower extremity edema present      Data Reviewed: I have personally reviewed following labs and imaging studies  CBC: Recent Labs  Lab 05/13/24 1225 05/17/24 1230 05/17/24 1316 05/18/24 0406  WBC 6.5 4.1  --  5.7  NEUTROABS 4.5 2.4  --   --   HGB 12.6 11.9* 11.6* 12.6  HCT 38.1 36.7 34.0* 37.6  MCV 94.1 96.8  --  93.8  PLT 217 199  --  210   Basic Metabolic Panel: Recent Labs  Lab 05/13/24 1225 05/17/24 1230 05/17/24 1316 05/18/24 0406  NA 142  --  143 143  K 3.9  --  3.8 3.9  CL 105  --  111 109  CO2 24  --   --  24  GLUCOSE 107*  --  95 107*  BUN 25*  --  39* 23  CREATININE 0.91  --  0.90 0.90  CALCIUM 10.5*  --   --  9.4  MG  --  2.2  --  2.1   GFR: Estimated Creatinine Clearance: 50 mL/min (by C-G formula based on SCr of 0.9 mg/dL). Liver Function Tests: Recent Labs  Lab 05/13/24 1225 05/18/24 0406  AST 18 27  ALT 14 31  ALKPHOS 57 44  BILITOT 0.4 0.6  PROT 7.6 6.5  ALBUMIN 4.9 3.9   Recent Labs  Lab 05/13/24 1225  LIPASE 31   Recent Labs  Lab 05/13/24 1225 05/18/24 0406  AMMONIA 17 20   Coagulation Profile: No results for input(s):  INR, PROTIME in the last 168 hours. Cardiac Enzymes: No results for input(s): CKTOTAL, CKMB, CKMBINDEX, TROPONINI in the last 168 hours. BNP (last 3 results) No results for input(s): PROBNP in the last 8760 hours. HbA1C: No results for input(s): HGBA1C in the last 72 hours. CBG: Recent Labs  Lab 05/19/24 0725 05/19/24 1103 05/19/24 1628 05/19/24 2013  GLUCAP 76 128* 95 98   Lipid Profile: No results for input(s): CHOL, HDL, LDLCALC, TRIG, CHOLHDL, LDLDIRECT in the last 72 hours. Thyroid  Function Tests: Recent Labs    05/18/24 0406  TSH 3.275   Anemia Panel: Recent Labs    05/18/24 0406  VITAMINB12 431  FOLATE >20.0   Sepsis Labs: Recent Labs  Lab 05/17/24 1316  LATICACIDVEN 0.5    Recent Results (from the past 240 hours)  Resp panel by RT-PCR (RSV, Flu A&B, Covid) Anterior Nasal Swab     Status: None   Collection Time: 05/13/24 12:25 PM   Specimen: Anterior Nasal Swab  Result Value Ref Range Status   SARS Coronavirus 2 by RT PCR NEGATIVE NEGATIVE Final    Comment: (NOTE) SARS-CoV-2 target nucleic acids are NOT DETECTED.  The SARS-CoV-2 RNA is generally detectable in upper respiratory specimens during the acute phase of infection. The lowest concentration of SARS-CoV-2 viral copies this assay can detect is 138 copies/mL. A negative result does not preclude SARS-Cov-2 infection and should not be used as the sole basis for treatment or other patient management decisions. A negative result may occur with  improper specimen collection/handling, submission of specimen other than nasopharyngeal swab, presence of viral mutation(s) within the areas targeted by this assay, and inadequate number of viral copies(<138 copies/mL). A negative result must be combined with clinical observations, patient history, and epidemiological information. The expected result is Negative.  Fact Sheet for Patients:   BloggerCourse.com  Fact Sheet for Healthcare Providers:  SeriousBroker.it  This test is no t yet approved or cleared by the [place]  FDA and  has been authorized for detection and/or diagnosis of SARS-CoV-2 by FDA under an Emergency Use Authorization (EUA). This EUA will remain  in effect (meaning this test can be used) for the duration of the COVID-19 declaration under Section 564(b)(1) of the Act, 21 U.S.C.section 360bbb-3(b)(1), unless the authorization is terminated  or revoked sooner.       Influenza A by PCR NEGATIVE NEGATIVE Final   Influenza B by PCR NEGATIVE NEGATIVE Final    Comment: (NOTE) The Xpert Xpress SARS-CoV-2/FLU/RSV plus assay is intended as an aid in the diagnosis of influenza from Nasopharyngeal swab specimens and should not be used as a sole basis for treatment. Nasal washings and aspirates are unacceptable for Xpert Xpress SARS-CoV-2/FLU/RSV testing.  Fact Sheet for Patients:  BloggerCourse.com  Fact Sheet for Healthcare Providers: SeriousBroker.it  This test is not yet approved or cleared by the [place]  FDA and has been authorized for detection and/or diagnosis of SARS-CoV-2 by FDA under an Emergency Use Authorization (EUA). This EUA will remain in effect (meaning this test can be used) for the duration of the COVID-19 declaration under Section 564(b)(1) of the Act, 21 U.S.C. section 360bbb-3(b)(1), unless the authorization is terminated or revoked.     Resp Syncytial Virus by PCR NEGATIVE NEGATIVE Final    Comment: (NOTE) Fact Sheet for Patients: BloggerCourse.com  Fact Sheet for Healthcare Providers: SeriousBroker.it  This test is not yet approved or cleared by the [place]  FDA and has been authorized for detection and/or diagnosis of SARS-CoV-2 by FDA under an Emergency Use  Authorization (EUA). This EUA will remain in effect (meaning this test can be used) for the duration of the COVID-19 declaration under Section 564(b)(1) of the Act, 21 U.S.C. section 360bbb-3(b)(1), unless the authorization is terminated or revoked.  Performed at [facility], [address] [place], [place] [address]   Urine Culture     Status: Abnormal   Collection Time: 05/17/24 12:29 PM   Specimen: Urine, Clean Catch  Result Value Ref Range Status   Specimen Description URINE, CLEAN CATCH  Final   Special Requests NONE  Final   Culture (A)  Final    <10,000 COLONIES/mL INSIGNIFICANT GROWTH Performed at [facility], [address] [place], [place] [address]    Report Status 05/18/2024 FINAL  Final  Resp panel by RT-PCR (RSV, Flu A&B, Covid) Urine, Clean Catch     Status: None   Collection Time: 05/17/24 12:30 PM   Specimen: Urine, Clean Catch; Nasal Swab  Result Value Ref Range Status   SARS Coronavirus 2 by RT PCR NEGATIVE NEGATIVE Final   Influenza A by PCR NEGATIVE NEGATIVE Final   Influenza B by PCR NEGATIVE NEGATIVE Final    Comment: (NOTE) The Xpert Xpress SARS-CoV-2/FLU/RSV plus assay is intended as an aid in the diagnosis of influenza from Nasopharyngeal swab specimens and should not be used as a sole basis for treatment. Nasal washings and aspirates are unacceptable for Xpert Xpress SARS-CoV-2/FLU/RSV testing.  Fact Sheet for Patients: BloggerCourse.com  Fact Sheet for Healthcare Providers: SeriousBroker.it  This test is not yet approved or cleared by the [place]  FDA and has been authorized for detection and/or diagnosis of SARS-CoV-2 by FDA under an Emergency Use Authorization (EUA). This EUA will remain in effect (meaning this test can be used) for the duration of the COVID-19 declaration under Section 564(b)(1) of the Act, 21 U.S.C. section 360bbb-3(b)(1), unless the  authorization is terminated or revoked.     Resp Syncytial Virus by PCR NEGATIVE NEGATIVE Final    Comment: (NOTE) Fact Sheet for Patients: BloggerCourse.com  Fact Sheet for Healthcare Providers: SeriousBroker.it  This test is not yet approved or cleared by the [place]  FDA and has been authorized for detection and/or diagnosis of SARS-CoV-2 by FDA under an Emergency Use Authorization (EUA). This EUA will remain in effect (meaning this test can be used) for the duration of the COVID-19 declaration under Section 564(b)(1) of the Act, 21 U.S.C. section 360bbb-3(b)(1), unless the authorization is terminated or revoked.  Performed at [facility], [address] [place], [place] [address]   Culture, blood (routine x 2)     Status: None (Preliminary result)   Collection Time: 05/17/24 12:31 PM   Specimen:  BLOOD  Result Value Ref Range Status   Specimen Description BLOOD RIGHT ANTECUBITAL  Final   Special Requests   Final    BOTTLES DRAWN AEROBIC AND ANAEROBIC Blood Culture results may not be optimal due to an inadequate volume of blood received in culture bottles   Culture   Final    NO GROWTH 2 DAYS Performed at [facility], [address], [place], [place] [address]    Report Status PENDING  Incomplete  Culture, blood (routine x 2)     Status: None (Preliminary result)   Collection Time: 05/17/24  2:20 PM   Specimen: BLOOD  Result Value Ref Range Status   Specimen Description BLOOD LEFT ANTECUBITAL  Final   Special Requests   Final    BOTTLES DRAWN AEROBIC AND ANAEROBIC Blood Culture results may not be optimal due to an inadequate volume of blood received in culture bottles   Culture   Final    NO GROWTH 2 DAYS Performed at [facility], [address] [place], [place] [address]    Report Status PENDING  Incomplete  MRSA Next Gen by PCR, Nasal     Status: None   Collection Time: 05/19/24  6:15 AM    Specimen: Nasal Mucosa; Nasal Swab  Result Value Ref Range Status   MRSA by PCR Next Gen NOT DETECTED NOT DETECTED Final    Comment: (NOTE) The GeneXpert MRSA Assay (FDA approved for NASAL specimens only), is one component of a comprehensive MRSA colonization surveillance program. It is not intended to diagnose MRSA infection nor to guide or monitor treatment for MRSA infections. Test performance is not FDA approved in patients less than 60 years old. Performed at [facility], [address], [place], [place] [address]          Radiology Studies: No results found.       Scheduled Meds:  enoxaparin  (LOVENOX ) injection  40 mg Subcutaneous Q24H   memantine   5 mg Oral BID   metoprolol  tartrate  25 mg Oral BID   simvastatin   20 mg Oral q1800   venlafaxine  XR  75 mg Oral Q breakfast   Continuous Infusions:  sodium chloride  75 mL/hr at 05/20/24 0216   cefTRIAXone  (ROCEPHIN )  IV 1 g (05/20/24 0835)          Sophie Mao, MD [facility] 05/20/2024, 8:35 AM

## 2024-05-20 NOTE — Plan of Care (Signed)
   Problem: Safety: Goal: Ability to remain free from injury will improve Outcome: Progressing

## 2024-05-21 DIAGNOSIS — R2689 Other abnormalities of gait and mobility: Secondary | ICD-10-CM | POA: Diagnosis not present

## 2024-05-21 DIAGNOSIS — G9340 Encephalopathy, unspecified: Secondary | ICD-10-CM | POA: Diagnosis not present

## 2024-05-21 DIAGNOSIS — I110 Hypertensive heart disease with heart failure: Secondary | ICD-10-CM | POA: Diagnosis not present

## 2024-05-21 DIAGNOSIS — E785 Hyperlipidemia, unspecified: Secondary | ICD-10-CM | POA: Diagnosis not present

## 2024-05-21 DIAGNOSIS — Z85828 Personal history of other malignant neoplasm of skin: Secondary | ICD-10-CM | POA: Diagnosis not present

## 2024-05-21 DIAGNOSIS — N390 Urinary tract infection, site not specified: Secondary | ICD-10-CM | POA: Diagnosis not present

## 2024-05-21 DIAGNOSIS — G9341 Metabolic encephalopathy: Secondary | ICD-10-CM | POA: Diagnosis not present

## 2024-05-21 DIAGNOSIS — R2681 Unsteadiness on feet: Secondary | ICD-10-CM | POA: Diagnosis not present

## 2024-05-21 DIAGNOSIS — M6281 Muscle weakness (generalized): Secondary | ICD-10-CM | POA: Diagnosis not present

## 2024-05-21 DIAGNOSIS — I5042 Chronic combined systolic (congestive) and diastolic (congestive) heart failure: Secondary | ICD-10-CM | POA: Diagnosis not present

## 2024-05-21 LAB — GLUCOSE, CAPILLARY
Glucose-Capillary: 107 mg/dL — ABNORMAL HIGH (ref 70–99)
Glucose-Capillary: 116 mg/dL — ABNORMAL HIGH (ref 70–99)
Glucose-Capillary: 103 mg/dL — ABNORMAL HIGH (ref 70–99)
Glucose-Capillary: 120 mg/dL — ABNORMAL HIGH (ref 70–99)

## 2024-05-21 MED ORDER — LOSARTAN POTASSIUM 25 MG PO TABS
25.0000 mg | ORAL_TABLET | Freq: Every day | ORAL | Status: DC
  Administered 2024-05-21 – 2024-05-25 (×5): 25 mg via ORAL
  Filled 2024-05-21 (×5): qty 1

## 2024-05-21 NOTE — Plan of Care (Signed)
   Problem: Safety: Goal: Ability to remain free from injury will improve Outcome: Progressing

## 2024-05-21 NOTE — Plan of Care (Signed)
  Problem: Safety: Goal: Ability to remain free from injury will improve Outcome: Progressing   Problem: Urinary Elimination: Goal: Signs and symptoms of infection will decrease Outcome: Progressing   

## 2024-05-21 NOTE — Progress Notes (Signed)
 PROGRESS NOTE    [name]  FMW:[id number] DOB: [birth year] DOA: 05/17/2024 PCP: Amon Aloysius BRAVO, MD   Brief Narrative:   88 y.o. female with medical history significant of dementia, hypertension, hyperlipidemia, chronic systolic and diastolic heart failure presented with worsening confusion.  On presentation, patient was mildly tachycardic and tachypneic with normal WBCs, renal function and chest x-ray.  UA suggestive of UTI.  She was started on IV antibiotics.  PT recommended SNF placement.  Currently medically stable for discharge to SNF.  Assessment & Plan:   Acute metabolic encephalopathy - Possibly from UTI.  Recent CT head on 05/13/2024 was negative for acute intracranial abnormality. - Continue IV antibiotics.   - Fall precautions.  Mental status improving.  Possibly back to baseline. -PT recommending SNF placement.  TOC following.  Diet as per SLP recommendations - monitor mental status   UTI: Present on admission - Continue Rocephin .  Finish 7-day course of therapy.  Urine cultures grew less than 10,000 colonies per mL insignificant growth.    History of dementia with anxiety - Continue memantine .  Continue Effexor   Hypertension - Blood pressure improving.  Continue metoprolol  for now.  Monitor blood pressure.  Hyperlipidemia - Continue simvastatin    DVT prophylaxis: Lovenox  Code Status: DNR.  Family Communication: Daughter at bedside on 05/17/2024 Disposition Plan: Status is: Observation The patient will require care spanning > 2 midnights and should be moved to inpatient because: Of severity of illness.  Need for SNF placement.  Still on IV antibiotics.  Currently medically stable for discharge to SNF.   Consultants: None  Procedures: None  Antimicrobials: Rocephin  from 05/17/2024 onwards   Subjective: Patient seen and examined at bedside.  No fever, vomiting, agitation reported.. Objective: Vitals:   05/20/24 1634 05/20/24 2000 05/21/24 0404 05/21/24 0738  BP:  (!) 143/89 (!) 179/90 (!) 152/94 (!) 153/92  Pulse: 76 70 89 86  Resp:  18 18   Temp: 97.8 F (36.6 C) 98.1 F (36.7 C) 98.1 F (36.7 C) 98.6 F (37 C)  TempSrc: Oral Oral Oral Oral  SpO2: 97% 98% 95% 93%  Weight:        Intake/Output Summary (Last 24 hours) at 05/21/2024 0834 Last data filed at 05/21/2024 0407 Gross per 24 hour  Intake 120 ml  Output 880 ml  Net -760 ml   Filed Weights   05/18/24 0521  Weight: 84.1 kg    Examination:  General: No acute distress.  Currently on room air.  Remains slightly confused.  Slow to respond.  Poor historian. respiratory: Decreased breath sounds at bases bilaterally with some crackles CVS: S1-S2 heard; currently rate controlled  abdominal: Soft, nontender, remains slightly distended; no organomegaly, bowel sounds heard Extremities: Trace lower extremity edema; no cyanosis     Data Reviewed: I have personally reviewed following labs and imaging studies  CBC: Recent Labs  Lab 05/17/24 1230 05/17/24 1316 05/18/24 0406  WBC 4.1  --  5.7  NEUTROABS 2.4  --   --   HGB 11.9* 11.6* 12.6  HCT 36.7 34.0* 37.6  MCV 96.8  --  93.8  PLT 199  --  210   Basic Metabolic Panel: Recent Labs  Lab 05/17/24 1230 05/17/24 1316 05/18/24 0406  NA  --  143 143  K  --  3.8 3.9  CL  --  111 109  CO2  --   --  24  GLUCOSE  --  95 107*  BUN  --  39* 23  CREATININE  --  0.90 0.90  CALCIUM  --   --  9.4  MG 2.2  --  2.1   GFR: Estimated Creatinine Clearance: 50 mL/min (by C-G formula based on SCr of 0.9 mg/dL). Liver Function Tests: Recent Labs  Lab 05/18/24 0406  AST 27  ALT 31  ALKPHOS 44  BILITOT 0.6  PROT 6.5  ALBUMIN 3.9   No results for input(s): LIPASE, AMYLASE in the last 168 hours.  Recent Labs  Lab 05/18/24 0406  AMMONIA 20   Coagulation Profile: No results for input(s): INR, PROTIME in the last 168 hours. Cardiac Enzymes: No results for input(s): CKTOTAL, CKMB, CKMBINDEX, TROPONINI in the last  168 hours. BNP (last 3 results) No results for input(s): PROBNP in the last 8760 hours. HbA1C: No results for input(s): HGBA1C in the last 72 hours. CBG: Recent Labs  Lab 05/19/24 2013 05/20/24 1022 05/20/24 1637 05/20/24 2001 05/21/24 0737  GLUCAP 98 114* 94 114* 107*   Lipid Profile: No results for input(s): CHOL, HDL, LDLCALC, TRIG, CHOLHDL, LDLDIRECT in the last 72 hours. Thyroid  Function Tests: No results for input(s): TSH, T4TOTAL, FREET4, T3FREE, THYROIDAB in the last 72 hours.  Anemia Panel: No results for input(s): VITAMINB12, FOLATE, FERRITIN, TIBC, IRON, RETICCTPCT in the last 72 hours.  Sepsis Labs: Recent Labs  Lab 05/17/24 1316  LATICACIDVEN 0.5    Recent Results (from the past 240 hours)  Resp panel by RT-PCR (RSV, Flu A&B, Covid) Anterior Nasal Swab     Status: None   Collection Time: 05/13/24 12:25 PM   Specimen: Anterior Nasal Swab  Result Value Ref Range Status   SARS Coronavirus 2 by RT PCR NEGATIVE NEGATIVE Final    Comment: (NOTE) SARS-CoV-2 target nucleic acids are NOT DETECTED.  The SARS-CoV-2 RNA is generally detectable in upper respiratory specimens during the acute phase of infection. The lowest concentration of SARS-CoV-2 viral copies this assay can detect is 138 copies/mL. A negative result does not preclude SARS-Cov-2 infection and should not be used as the sole basis for treatment or other patient management decisions. A negative result may occur with  improper specimen collection/handling, submission of specimen other than nasopharyngeal swab, presence of viral mutation(s) within the areas targeted by this assay, and inadequate number of viral copies(<138 copies/mL). A negative result must be combined with clinical observations, patient history, and epidemiological information. The expected result is Negative.  Fact Sheet for Patients:  BloggerCourse.com  Fact Sheet for  Healthcare Providers:  SeriousBroker.it  This test is no t yet approved or cleared by the [place]  FDA and  has been authorized for detection and/or diagnosis of SARS-CoV-2 by FDA under an Emergency Use Authorization (EUA). This EUA will remain  in effect (meaning this test can be used) for the duration of the COVID-19 declaration under Section 564(b)(1) of the Act, 21 U.S.C.section 360bbb-3(b)(1), unless the authorization is terminated  or revoked sooner.       Influenza A by PCR NEGATIVE NEGATIVE Final   Influenza B by PCR NEGATIVE NEGATIVE Final    Comment: (NOTE) The Xpert Xpress SARS-CoV-2/FLU/RSV plus assay is intended as an aid in the diagnosis of influenza from Nasopharyngeal swab specimens and should not be used as a sole basis for treatment. Nasal washings and aspirates are unacceptable for Xpert Xpress SARS-CoV-2/FLU/RSV testing.  Fact Sheet for Patients: BloggerCourse.com  Fact Sheet for Healthcare Providers: SeriousBroker.it  This test is not yet approved or cleared by the [place]  FDA and has been authorized for detection and/or diagnosis  of SARS-CoV-2 by FDA under an Emergency Use Authorization (EUA). This EUA will remain in effect (meaning this test can be used) for the duration of the COVID-19 declaration under Section 564(b)(1) of the Act, 21 U.S.C. section 360bbb-3(b)(1), unless the authorization is terminated or revoked.     Resp Syncytial Virus by PCR NEGATIVE NEGATIVE Final    Comment: (NOTE) Fact Sheet for Patients: BloggerCourse.com  Fact Sheet for Healthcare Providers: SeriousBroker.it  This test is not yet approved or cleared by the [place]  FDA and has been authorized for detection and/or diagnosis of SARS-CoV-2 by FDA under an Emergency Use Authorization (EUA). This EUA will remain in effect (meaning this  test can be used) for the duration of the COVID-19 declaration under Section 564(b)(1) of the Act, 21 U.S.C. section 360bbb-3(b)(1), unless the authorization is terminated or revoked.  Performed at [facility], [address] [place], [place] [address]   Urine Culture     Status: Abnormal   Collection Time: 05/17/24 12:29 PM   Specimen: Urine, Clean Catch  Result Value Ref Range Status   Specimen Description URINE, CLEAN CATCH  Final   Special Requests NONE  Final   Culture (A)  Final    <10,000 COLONIES/mL INSIGNIFICANT GROWTH Performed at [facility], [address] [place], [place] [address]    Report Status 05/18/2024 FINAL  Final  Resp panel by RT-PCR (RSV, Flu A&B, Covid) Urine, Clean Catch     Status: None   Collection Time: 05/17/24 12:30 PM   Specimen: Urine, Clean Catch; Nasal Swab  Result Value Ref Range Status   SARS Coronavirus 2 by RT PCR NEGATIVE NEGATIVE Final   Influenza A by PCR NEGATIVE NEGATIVE Final   Influenza B by PCR NEGATIVE NEGATIVE Final    Comment: (NOTE) The Xpert Xpress SARS-CoV-2/FLU/RSV plus assay is intended as an aid in the diagnosis of influenza from Nasopharyngeal swab specimens and should not be used as a sole basis for treatment. Nasal washings and aspirates are unacceptable for Xpert Xpress SARS-CoV-2/FLU/RSV testing.  Fact Sheet for Patients: BloggerCourse.com  Fact Sheet for Healthcare Providers: SeriousBroker.it  This test is not yet approved or cleared by the [place]  FDA and has been authorized for detection and/or diagnosis of SARS-CoV-2 by FDA under an Emergency Use Authorization (EUA). This EUA will remain in effect (meaning this test can be used) for the duration of the COVID-19 declaration under Section 564(b)(1) of the Act, 21 U.S.C. section 360bbb-3(b)(1), unless the authorization is terminated or revoked.     Resp Syncytial Virus by  PCR NEGATIVE NEGATIVE Final    Comment: (NOTE) Fact Sheet for Patients: BloggerCourse.com  Fact Sheet for Healthcare Providers: SeriousBroker.it  This test is not yet approved or cleared by the [place]  FDA and has been authorized for detection and/or diagnosis of SARS-CoV-2 by FDA under an Emergency Use Authorization (EUA). This EUA will remain in effect (meaning this test can be used) for the duration of the COVID-19 declaration under Section 564(b)(1) of the Act, 21 U.S.C. section 360bbb-3(b)(1), unless the authorization is terminated or revoked.  Performed at [facility], [address] [place], [place] [address]   Culture, blood (routine x 2)     Status: None (Preliminary result)   Collection Time: 05/17/24 12:31 PM   Specimen: BLOOD  Result Value Ref Range Status   Specimen Description BLOOD RIGHT ANTECUBITAL  Final   Special Requests   Final    BOTTLES DRAWN AEROBIC AND  ANAEROBIC Blood Culture results may not be optimal due to an inadequate volume of blood received in culture bottles   Culture   Final    NO GROWTH 3 DAYS Performed at [facility], [address] [place], [place] [address]    Report Status PENDING  Incomplete  Culture, blood (routine x 2)     Status: None (Preliminary result)   Collection Time: 05/17/24  2:20 PM   Specimen: BLOOD  Result Value Ref Range Status   Specimen Description BLOOD LEFT ANTECUBITAL  Final   Special Requests   Final    BOTTLES DRAWN AEROBIC AND ANAEROBIC Blood Culture results may not be optimal due to an inadequate volume of blood received in culture bottles   Culture   Final    NO GROWTH 3 DAYS Performed at [facility], [address] [place], [place] [address]    Report Status PENDING  Incomplete  MRSA Next Gen by PCR, Nasal     Status: None   Collection Time: 05/19/24  6:15 AM   Specimen: Nasal Mucosa; Nasal Swab  Result Value Ref Range Status    MRSA by PCR Next Gen NOT DETECTED NOT DETECTED Final    Comment: (NOTE) The GeneXpert MRSA Assay (FDA approved for NASAL specimens only), is one component of a comprehensive MRSA colonization surveillance program. It is not intended to diagnose MRSA infection nor to guide or monitor treatment for MRSA infections. Test performance is not FDA approved in patients less than 47 years old. Performed at [facility], [address] [place], [place] [address]          Radiology Studies: No results found.       Scheduled Meds:  enoxaparin  (LOVENOX ) injection  40 mg Subcutaneous Q24H   memantine   5 mg Oral BID   metoprolol  tartrate  25 mg Oral BID   simvastatin   20 mg Oral q1800   venlafaxine  XR  75 mg Oral Q breakfast   Continuous Infusions:  cefTRIAXone  (ROCEPHIN )  IV 1 g (05/20/24 0835)          Sophie Mao, MD [facility] 05/21/2024, 8:34 AM

## 2024-05-22 DIAGNOSIS — M6281 Muscle weakness (generalized): Secondary | ICD-10-CM | POA: Diagnosis not present

## 2024-05-22 DIAGNOSIS — G9341 Metabolic encephalopathy: Secondary | ICD-10-CM | POA: Diagnosis not present

## 2024-05-22 DIAGNOSIS — R2681 Unsteadiness on feet: Secondary | ICD-10-CM | POA: Diagnosis not present

## 2024-05-22 DIAGNOSIS — N390 Urinary tract infection, site not specified: Secondary | ICD-10-CM | POA: Diagnosis not present

## 2024-05-22 DIAGNOSIS — Z85828 Personal history of other malignant neoplasm of skin: Secondary | ICD-10-CM | POA: Diagnosis not present

## 2024-05-22 DIAGNOSIS — E785 Hyperlipidemia, unspecified: Secondary | ICD-10-CM | POA: Diagnosis not present

## 2024-05-22 DIAGNOSIS — R2689 Other abnormalities of gait and mobility: Secondary | ICD-10-CM | POA: Diagnosis not present

## 2024-05-22 DIAGNOSIS — I5042 Chronic combined systolic (congestive) and diastolic (congestive) heart failure: Secondary | ICD-10-CM | POA: Diagnosis not present

## 2024-05-22 DIAGNOSIS — I110 Hypertensive heart disease with heart failure: Secondary | ICD-10-CM | POA: Diagnosis not present

## 2024-05-22 DIAGNOSIS — G9340 Encephalopathy, unspecified: Secondary | ICD-10-CM | POA: Diagnosis not present

## 2024-05-22 LAB — CULTURE, BLOOD (ROUTINE X 2)
Culture: NO GROWTH
Culture: NO GROWTH

## 2024-05-22 MED ORDER — METOPROLOL TARTRATE 25 MG PO TABS: 25.0000 mg | ORAL_TABLET | Freq: Two times a day (BID) | ORAL | 0 refills | Status: AC

## 2024-05-22 NOTE — Progress Notes (Signed)
 PROGRESS NOTE    [name]  FMW:[id number] DOB: [birth year] DOA: 05/17/2024 PCP: Amon Aloysius BRAVO, MD   Brief Narrative:   88 y.o. female with medical history significant of dementia, hypertension, hyperlipidemia, chronic systolic and diastolic heart failure presented with worsening confusion.  On presentation, patient was mildly tachycardic and tachypneic with normal WBCs, renal function and chest x-ray.  UA suggestive of UTI.  She was started on IV antibiotics.  PT recommended SNF placement.  Currently medically stable for discharge to SNF.  Assessment & Plan:   Acute metabolic encephalopathy - Possibly from UTI.  Recent CT head on 05/13/2024 was negative for acute intracranial abnormality. - Continue IV antibiotics.   - Fall precautions.  Mental status improving.  Possibly back to baseline. -PT recommending SNF placement.  TOC following.  Diet as per SLP recommendations - monitor mental status   UTI: Present on admission - Continue Rocephin .  Finish 7-day course of therapy.  Urine cultures grew less than 10,000 colonies per mL insignificant growth.    History of dementia with anxiety - Continue memantine .  Continue Effexor   Hypertension - Blood pressure improving.  Continue metoprolol  for now.  Monitor blood pressure.  Hyperlipidemia - Continue simvastatin    DVT prophylaxis: Lovenox  Code Status: DNR.  Family Communication: Daughter at bedside on 05/17/2024 Disposition Plan: Status is: Observation The patient will require care spanning > 2 midnights and should be moved to inpatient because: Of severity of illness.  Need for SNF placement.  Still on IV antibiotics.  Currently medically stable for discharge to SNF.   Consultants: None  Procedures: None  Antimicrobials: Rocephin  from 05/17/2024 onwards   Subjective: Patient seen and examined at bedside.  No agitation, seizures, fever reported Objective: Vitals:   05/21/24 1458 05/21/24 2015 05/22/24 0416 05/22/24 0828  BP:  121/74 (!) 135/92 (!) 157/87 122/76  Pulse: 86 92 78 88  Resp:  18 18 18   Temp: (!) 97.4 F (36.3 C) 98.2 F (36.8 C) 97.6 F (36.4 C) 98.2 F (36.8 C)  TempSrc: Oral Oral Oral Oral  SpO2: 95% 98% 94% 94%  Weight:        Intake/Output Summary (Last 24 hours) at 05/22/2024 0833 Last data filed at 05/22/2024 0600 Gross per 24 hour  Intake 720 ml  Output 1310 ml  Net -590 ml   Filed Weights   05/18/24 0521  Weight: 84.1 kg    Examination:  General: Remains on room air and in no distress.  Remains slightly confused.  Still slow to respond.  Poor historian. respiratory: Bilateral decreased breath sounds at bases with scattered crackles CVS: Rate mostly controlled; S1 and S2 are heard  abdominal: Soft, nontender, distended slightly; no organomegaly, normal bowel sounds are heard  extremities: No clubbing; mild lower extremity edema present    Data Reviewed: I have personally reviewed following labs and imaging studies  CBC: Recent Labs  Lab 05/17/24 1230 05/17/24 1316 05/18/24 0406  WBC 4.1  --  5.7  NEUTROABS 2.4  --   --   HGB 11.9* 11.6* 12.6  HCT 36.7 34.0* 37.6  MCV 96.8  --  93.8  PLT 199  --  210   Basic Metabolic Panel: Recent Labs  Lab 05/17/24 1230 05/17/24 1316 05/18/24 0406  NA  --  143 143  K  --  3.8 3.9  CL  --  111 109  CO2  --   --  24  GLUCOSE  --  95 107*  BUN  --  39* 23  CREATININE  --  0.90 0.90  CALCIUM  --   --  9.4  MG 2.2  --  2.1   GFR: Estimated Creatinine Clearance: 50 mL/min (by C-G formula based on SCr of 0.9 mg/dL). Liver Function Tests: Recent Labs  Lab 05/18/24 0406  AST 27  ALT 31  ALKPHOS 44  BILITOT 0.6  PROT 6.5  ALBUMIN 3.9   No results for input(s): LIPASE, AMYLASE in the last 168 hours.  Recent Labs  Lab 05/18/24 0406  AMMONIA 20   Coagulation Profile: No results for input(s): INR, PROTIME in the last 168 hours. Cardiac Enzymes: No results for input(s): CKTOTAL, CKMB, CKMBINDEX,  TROPONINI in the last 168 hours. BNP (last 3 results) No results for input(s): PROBNP in the last 8760 hours. HbA1C: No results for input(s): HGBA1C in the last 72 hours. CBG: Recent Labs  Lab 05/20/24 2001 05/21/24 0737 05/21/24 1133 05/21/24 1621 05/21/24 2013  GLUCAP 114* 107* 116* 103* 120*   Lipid Profile: No results for input(s): CHOL, HDL, LDLCALC, TRIG, CHOLHDL, LDLDIRECT in the last 72 hours. Thyroid  Function Tests: No results for input(s): TSH, T4TOTAL, FREET4, T3FREE, THYROIDAB in the last 72 hours.  Anemia Panel: No results for input(s): VITAMINB12, FOLATE, FERRITIN, TIBC, IRON, RETICCTPCT in the last 72 hours.  Sepsis Labs: Recent Labs  Lab 05/17/24 1316  LATICACIDVEN 0.5    Recent Results (from the past 240 hours)  Resp panel by RT-PCR (RSV, Flu A&B, Covid) Anterior Nasal Swab     Status: None   Collection Time: 05/13/24 12:25 PM   Specimen: Anterior Nasal Swab  Result Value Ref Range Status   SARS Coronavirus 2 by RT PCR NEGATIVE NEGATIVE Final    Comment: (NOTE) SARS-CoV-2 target nucleic acids are NOT DETECTED.  The SARS-CoV-2 RNA is generally detectable in upper respiratory specimens during the acute phase of infection. The lowest concentration of SARS-CoV-2 viral copies this assay can detect is 138 copies/mL. A negative result does not preclude SARS-Cov-2 infection and should not be used as the sole basis for treatment or other patient management decisions. A negative result may occur with  improper specimen collection/handling, submission of specimen other than nasopharyngeal swab, presence of viral mutation(s) within the areas targeted by this assay, and inadequate number of viral copies(<138 copies/mL). A negative result must be combined with clinical observations, patient history, and epidemiological information. The expected result is Negative.  Fact Sheet for Patients:   BloggerCourse.com  Fact Sheet for Healthcare Providers:  SeriousBroker.it  This test is no t yet approved or cleared by the [place]  FDA and  has been authorized for detection and/or diagnosis of SARS-CoV-2 by FDA under an Emergency Use Authorization (EUA). This EUA will remain  in effect (meaning this test can be used) for the duration of the COVID-19 declaration under Section 564(b)(1) of the Act, 21 U.S.C.section 360bbb-3(b)(1), unless the authorization is terminated  or revoked sooner.       Influenza A by PCR NEGATIVE NEGATIVE Final   Influenza B by PCR NEGATIVE NEGATIVE Final    Comment: (NOTE) The Xpert Xpress SARS-CoV-2/FLU/RSV plus assay is intended as an aid in the diagnosis of influenza from Nasopharyngeal swab specimens and should not be used as a sole basis for treatment. Nasal washings and aspirates are unacceptable for Xpert Xpress SARS-CoV-2/FLU/RSV testing.  Fact Sheet for Patients: BloggerCourse.com  Fact Sheet for Healthcare Providers: SeriousBroker.it  This test is not yet approved or cleared by the [place]  FDA and  has been authorized for detection and/or diagnosis of SARS-CoV-2 by FDA under an Emergency Use Authorization (EUA). This EUA will remain in effect (meaning this test can be used) for the duration of the COVID-19 declaration under Section 564(b)(1) of the Act, 21 U.S.C. section 360bbb-3(b)(1), unless the authorization is terminated or revoked.     Resp Syncytial Virus by PCR NEGATIVE NEGATIVE Final    Comment: (NOTE) Fact Sheet for Patients: BloggerCourse.com  Fact Sheet for Healthcare Providers: SeriousBroker.it  This test is not yet approved or cleared by the [place]  FDA and has been authorized for detection and/or diagnosis of SARS-CoV-2 by FDA under an Emergency Use  Authorization (EUA). This EUA will remain in effect (meaning this test can be used) for the duration of the COVID-19 declaration under Section 564(b)(1) of the Act, 21 U.S.C. section 360bbb-3(b)(1), unless the authorization is terminated or revoked.  Performed at [facility], [address], [place], [place] [address]   Urine Culture     Status: Abnormal   Collection Time: 05/17/24 12:29 PM   Specimen: Urine, Clean Catch  Result Value Ref Range Status   Specimen Description URINE, CLEAN CATCH  Final   Special Requests NONE  Final   Culture (A)  Final    <10,000 COLONIES/mL INSIGNIFICANT GROWTH Performed at [facility], [address] [place], [place] [address]    Report Status 05/18/2024 FINAL  Final  Resp panel by RT-PCR (RSV, Flu A&B, Covid) Urine, Clean Catch     Status: None   Collection Time: 05/17/24 12:30 PM   Specimen: Urine, Clean Catch; Nasal Swab  Result Value Ref Range Status   SARS Coronavirus 2 by RT PCR NEGATIVE NEGATIVE Final   Influenza A by PCR NEGATIVE NEGATIVE Final   Influenza B by PCR NEGATIVE NEGATIVE Final    Comment: (NOTE) The Xpert Xpress SARS-CoV-2/FLU/RSV plus assay is intended as an aid in the diagnosis of influenza from Nasopharyngeal swab specimens and should not be used as a sole basis for treatment. Nasal washings and aspirates are unacceptable for Xpert Xpress SARS-CoV-2/FLU/RSV testing.  Fact Sheet for Patients: BloggerCourse.com  Fact Sheet for Healthcare Providers: SeriousBroker.it  This test is not yet approved or cleared by the [place]  FDA and has been authorized for detection and/or diagnosis of SARS-CoV-2 by FDA under an Emergency Use Authorization (EUA). This EUA will remain in effect (meaning this test can be used) for the duration of the COVID-19 declaration under Section 564(b)(1) of the Act, 21 U.S.C. section 360bbb-3(b)(1), unless the  authorization is terminated or revoked.     Resp Syncytial Virus by PCR NEGATIVE NEGATIVE Final    Comment: (NOTE) Fact Sheet for Patients: BloggerCourse.com  Fact Sheet for Healthcare Providers: SeriousBroker.it  This test is not yet approved or cleared by the [place]  FDA and has been authorized for detection and/or diagnosis of SARS-CoV-2 by FDA under an Emergency Use Authorization (EUA). This EUA will remain in effect (meaning this test can be used) for the duration of the COVID-19 declaration under Section 564(b)(1) of the Act, 21 U.S.C. section 360bbb-3(b)(1), unless the authorization is terminated or revoked.  Performed at [facility], [address], [place], [place] [address]   Culture, blood (routine x 2)     Status: None   Collection Time: 05/17/24 12:31 PM   Specimen: BLOOD  Result Value Ref Range Status   Specimen Description BLOOD RIGHT ANTECUBITAL  Final   Special Requests   Final  BOTTLES DRAWN AEROBIC AND ANAEROBIC Blood Culture results may not be optimal due to an inadequate volume of blood received in culture bottles   Culture   Final    NO GROWTH 5 DAYS Performed at [facility], [address], [place], [place] [address]    Report Status 05/22/2024 FINAL  Final  Culture, blood (routine x 2)     Status: None   Collection Time: 05/17/24  2:20 PM   Specimen: BLOOD  Result Value Ref Range Status   Specimen Description BLOOD LEFT ANTECUBITAL  Final   Special Requests   Final    BOTTLES DRAWN AEROBIC AND ANAEROBIC Blood Culture results may not be optimal due to an inadequate volume of blood received in culture bottles   Culture   Final    NO GROWTH 5 DAYS Performed at [facility], [address], [place], [place] [address]    Report Status 05/22/2024 FINAL  Final  MRSA Next Gen by PCR, Nasal     Status: None   Collection Time: 05/19/24  6:15 AM   Specimen: Nasal Mucosa; Nasal  Swab  Result Value Ref Range Status   MRSA by PCR Next Gen NOT DETECTED NOT DETECTED Final    Comment: (NOTE) The GeneXpert MRSA Assay (FDA approved for NASAL specimens only), is one component of a comprehensive MRSA colonization surveillance program. It is not intended to diagnose MRSA infection nor to guide or monitor treatment for MRSA infections. Test performance is not FDA approved in patients less than 33 years old. Performed at [facility], [address] [place], [place] [address]          Radiology Studies: No results found.       Scheduled Meds:  enoxaparin  (LOVENOX ) injection  40 mg Subcutaneous Q24H   losartan   25 mg Oral Daily   memantine   5 mg Oral BID   metoprolol  tartrate  25 mg Oral BID   simvastatin   20 mg Oral q1800   venlafaxine  XR  75 mg Oral Q breakfast   Continuous Infusions:  cefTRIAXone  (ROCEPHIN )  IV 1 g (05/21/24 0904)          Sophie Mao, MD [facility] 05/22/2024, 8:33 AM

## 2024-05-22 NOTE — TOC Progression Note (Addendum)
 Transition of Care [facility]) - Progression Note    Patient Details  Name: [name] MRN: [id number] Date of Birth: 03/16/1936  [facility]) CM/SW Contact  Lendia Dais, [place] Phone Number: 05/22/2024, 9:16 AM  Clinical Narrative:  Pt is disoriented x3, CSW called and spoke to daughter Jenkins.  Jenkins stated that she did not receive the email of skilled nursing facilites. CSW confirmed email of [email or web address]. CSW resent SNF medicare choice list and will follow up with Jenkins.  11:27 - [facility] requiring and updated FL2 w/ hx of anxiety. FL2 has been updated and signed by MD. CSW uploaded FL2 and pending PASSR.  PASSR is [phone] E. CSW Left a VM for [facility] admissions to accept bed offer.  11:46 - Jenkins has accepted bed offer at [facility], pending updated PT note for insurance auth.  1550: [facility] is pending, Ref ID number [id number].  CSW will continue to follow.    Expected Discharge Plan: Skilled Nursing Facility Barriers to Discharge: Continued Medical Work up               Expected Discharge Plan and Services In-house Referral: Clinical Social Work     Living arrangements for the past 2 months: Independent Living Facility                                       Social Drivers of Health (SDOH) Interventions SDOH Screenings   Food Insecurity: No Food Insecurity (05/18/2024)  Housing: Low Risk  (05/18/2024)  Transportation Needs: No Transportation Needs (05/18/2024)  Utilities: Not At Risk (05/18/2024)  Alcohol Screen: Low Risk  (08/19/2023)  Depression (PHQ2-9): Low Risk  (08/19/2023)  Financial Resource Strain: Low Risk  (08/19/2023)  Physical Activity: Inactive (08/19/2023)  Social Connections: Moderately Integrated (05/18/2024)  Stress: No Stress Concern Present (08/19/2023)  Tobacco Use: Low Risk  (05/19/2024)  Health Literacy: Adequate Health Literacy (08/19/2023)    Readmission Risk Interventions     No data to display

## 2024-05-22 NOTE — Discharge Summary (Incomplete)
 Physician Discharge Summary  [name] FMW:[id number] DOB: 1936/08/08 DOA: 05/17/2024  PCP: Amon Aloysius BRAVO, MD  Admit date: 05/17/2024 Discharge date: 05/22/2024  Admitted From: Home Disposition: SNF  Recommendations for Outpatient Follow-up:  Follow up with SNF provider at earliest convenience  follow up in ED if symptoms worsen or new appear   Home Health: No Equipment/Devices: None  Discharge Condition: Stable CODE STATUS: DNR Diet recommendation: Heart healthy/diet as aggressive recommendations  Brief/Interim Summary: 88 y.o. female with medical history significant of dementia, hypertension, hyperlipidemia, chronic systolic and diastolic heart failure presented with worsening confusion.  On presentation, patient was mildly tachycardic and tachypneic with normal WBCs, renal function and chest x-ray.  UA suggestive of UTI.  She was started on IV antibiotics.  Urine cultures grew less than 10,000 colonies per mL of significant growth.  She has completed a course of IV antibiotics.  She is currently hemodynamically stable.  PT recommended SNF placement.  Currently medically stable for discharge to SNF.  Discharge to SNF once bed available.  Discharge Diagnoses:   Acute metabolic encephalopathy - Possibly from UTI.  Recent CT head on 05/13/2024 was negative for acute intracranial abnormality. - Treated with IV Rocephin .   - Mental status improving.  Possibly back to baseline. -PT recommending SNF placement.  Diet as per SLP recommendations - Urine cultures grew less than 10,000 colonies per mL of significant growth.  She has completed a course of IV antibiotics.  She is currently hemodynamically stable. Currently medically stable for discharge to SNF.  Discharge to SNF once bed available   UTI: Present on admission - Treated with IV Rocephin  and completed course.  Urine cultures grew less than 10,000 colonies per mL insignificant growth.     History of dementia with anxiety - Continue  memantine .  Continue Effexor    Hypertension - Blood pressure improving.  Continue metoprolol  and losartan  for now.  Hyperlipidemia - Continue simvastatin    Discharge Instructions  Discharge Instructions     Diet - low sodium heart healthy   Complete by: As directed    Increase activity slowly   Complete by: As directed       Allergies as of 05/22/2024   No Known Allergies      Medication List     TAKE these medications    acetaminophen  325 MG tablet Commonly known as: TYLENOL  Take 650 mg by mouth in the morning and at bedtime.   alendronate  70 MG tablet Commonly known as: FOSAMAX  Take 70 mg by mouth once a week.   cholecalciferol 25 MCG (1000 UNIT) tablet Commonly known as: VITAMIN D3 Take 1,000 Units by mouth in the morning and at bedtime.   FIBER PO Take 1 capsule by mouth daily.   losartan  25 MG tablet Commonly known as: COZAAR  Take 25 mg by mouth daily.   meclizine  12.5 MG tablet Commonly known as: ANTIVERT  Take 1 tablet (12.5 mg total) by mouth 2 (two) times daily as needed for dizziness. What changed: when to take this   memantine  5 MG tablet Commonly known as: NAMENDA  Take 1 tablet (5 mg total) by mouth 2 (two) times daily. Needs appt   metoprolol  tartrate 25 MG tablet Commonly known as: LOPRESSOR  Take 1 tablet (25 mg total) by mouth 2 (two) times daily.   multivitamin with minerals tablet Take 1 tablet by mouth daily.   omeprazole  20 MG capsule Commonly known as: PRILOSEC Take 1 capsule (20 mg total) by mouth 2 (two) times daily before a  meal. Needs appt   simvastatin  20 MG tablet Commonly known as: ZOCOR  Take 1 tablet (20 mg total) by mouth daily.   venlafaxine  XR 75 MG 24 hr capsule Commonly known as: EFFEXOR -XR Take 1 capsule (75 mg total) by mouth daily with breakfast. Needs appt   VITAMIN E PO Take 2,000 Units by mouth daily.        No Known Allergies  Consultations: None   Procedures/Studies: DG Chest Portable 1  View Result Date: 05/17/2024 CLINICAL DATA:  Altered mental status.  Concern for pneumonia. EXAM: PORTABLE CHEST 1 VIEW COMPARISON:  Chest radiograph dated 05/13/2024. FINDINGS: No focal consolidation, pleural effusion, or pneumothorax. Stable cardiac silhouette. Atherosclerotic calcification of the aorta. No acute osseous pathology. IMPRESSION: No active disease. Electronically Signed   By: Vanetta Chou M.D.   On: 05/17/2024 13:22   DG Chest Portable 1 View Result Date: 05/13/2024 EXAM: 1 VIEW XRAY OF THE CHEST 05/13/2024 12:38:51 PM COMPARISON: 12/25/2023 CLINICAL HISTORY: Altered Mental Status FINDINGS: LUNGS AND PLEURA: No focal pulmonary opacity. No pulmonary edema. No pleural effusion. No pneumothorax. HEART AND MEDIASTINUM: No acute abnormality of the cardiac and mediastinal silhouettes. Aortic atherosclerotic plaque. BONES AND SOFT TISSUES: No acute osseous abnormality. IMPRESSION: 1. No acute findings. Electronically signed by: Waddell Calk MD 05/13/2024 12:47 PM EDT RP Workstation: [id number]   CT Head Wo Contrast Result Date: 05/13/2024 CLINICAL DATA:  Delirium EXAM: CT HEAD WITHOUT CONTRAST TECHNIQUE: Contiguous axial images were obtained from the base of the skull through the vertex without intravenous contrast. RADIATION DOSE REDUCTION: This exam was performed according to the departmental dose-optimization program which includes automated exposure control, adjustment of the mA and/or kV according to patient size and/or use of iterative reconstruction technique. COMPARISON:  CT head April 12, 25. FINDINGS: Brain: No evidence of acute infarction, hemorrhage, hydrocephalus, extra-axial collection or mass lesion/mass effect. Advanced patchy white matter hypodensities are similar to prior and compatible with chronic microvascular ischemic disease. Vascular: Calcific atherosclerosis.  No hyperdense vessel. Skull: No acute fracture.  Hyperostosis frontalis. Sinuses/Orbits: Mostly clear sinuses.  Opacified left ethmoid air cell. Left concha bullosa. No acute orbital findings. Other: No mastoid effusions. IMPRESSION: 1. No evidence of acute intracranial abnormality. 2. Advanced chronic microvascular ischemic disease. Electronically Signed   By: Gilmore GORMAN Molt M.D.   On: 05/13/2024 12:44      Subjective: Patient seen and examined at bedside.  No agitation, seizures, fever reported   Discharge Exam: Vitals:   05/22/24 0416 05/22/24 0828  BP: (!) 157/87 122/76  Pulse: 78 88  Resp: 18 18  Temp: 97.6 F (36.4 C) 98.2 F (36.8 C)  SpO2: 94% 94%   General: Remains on room air and in no distress.  Remains slightly confused.  Still slow to respond.  Poor historian. respiratory: Bilateral decreased breath sounds at bases with scattered crackles CVS: Rate mostly controlled; S1 and S2 are heard  abdominal: Soft, nontender, distended slightly; no organomegaly, normal bowel sounds are heard  extremities: No clubbing; mild lower extremity edema present      The results of significant diagnostics from this hospitalization (including imaging, microbiology, ancillary and laboratory) are listed below for reference.     Microbiology: Recent Results (from the past 240 hours)  Resp panel by RT-PCR (RSV, Flu A&B, Covid) Anterior Nasal Swab     Status: None   Collection Time: 05/13/24 12:25 PM   Specimen: Anterior Nasal Swab  Result Value Ref Range Status   SARS Coronavirus 2 by RT PCR NEGATIVE  NEGATIVE Final    Comment: (NOTE) SARS-CoV-2 target nucleic acids are NOT DETECTED.  The SARS-CoV-2 RNA is generally detectable in upper respiratory specimens during the acute phase of infection. The lowest concentration of SARS-CoV-2 viral copies this assay can detect is 138 copies/mL. A negative result does not preclude SARS-Cov-2 infection and should not be used as the sole basis for treatment or other patient management decisions. A negative result may occur with  improper specimen  collection/handling, submission of specimen other than nasopharyngeal swab, presence of viral mutation(s) within the areas targeted by this assay, and inadequate number of viral copies(<138 copies/mL). A negative result must be combined with clinical observations, patient history, and epidemiological information. The expected result is Negative.  Fact Sheet for Patients:  BloggerCourse.com  Fact Sheet for Healthcare Providers:  SeriousBroker.it  This test is no t yet approved or cleared by the [place]  FDA and  has been authorized for detection and/or diagnosis of SARS-CoV-2 by FDA under an Emergency Use Authorization (EUA). This EUA will remain  in effect (meaning this test can be used) for the duration of the COVID-19 declaration under Section 564(b)(1) of the Act, 21 U.S.C.section 360bbb-3(b)(1), unless the authorization is terminated  or revoked sooner.       Influenza A by PCR NEGATIVE NEGATIVE Final   Influenza B by PCR NEGATIVE NEGATIVE Final    Comment: (NOTE) The Xpert Xpress SARS-CoV-2/FLU/RSV plus assay is intended as an aid in the diagnosis of influenza from Nasopharyngeal swab specimens and should not be used as a sole basis for treatment. Nasal washings and aspirates are unacceptable for Xpert Xpress SARS-CoV-2/FLU/RSV testing.  Fact Sheet for Patients: BloggerCourse.com  Fact Sheet for Healthcare Providers: SeriousBroker.it  This test is not yet approved or cleared by the [place]  FDA and has been authorized for detection and/or diagnosis of SARS-CoV-2 by FDA under an Emergency Use Authorization (EUA). This EUA will remain in effect (meaning this test can be used) for the duration of the COVID-19 declaration under Section 564(b)(1) of the Act, 21 U.S.C. section 360bbb-3(b)(1), unless the authorization is terminated or revoked.     Resp Syncytial  Virus by PCR NEGATIVE NEGATIVE Final    Comment: (NOTE) Fact Sheet for Patients: BloggerCourse.com  Fact Sheet for Healthcare Providers: SeriousBroker.it  This test is not yet approved or cleared by the [place]  FDA and has been authorized for detection and/or diagnosis of SARS-CoV-2 by FDA under an Emergency Use Authorization (EUA). This EUA will remain in effect (meaning this test can be used) for the duration of the COVID-19 declaration under Section 564(b)(1) of the Act, 21 U.S.C. section 360bbb-3(b)(1), unless the authorization is terminated or revoked.  Performed at [facility], [address], [place], [place] [address]   Urine Culture     Status: Abnormal   Collection Time: 05/17/24 12:29 PM   Specimen: Urine, Clean Catch  Result Value Ref Range Status   Specimen Description URINE, CLEAN CATCH  Final   Special Requests NONE  Final   Culture (A)  Final    <10,000 COLONIES/mL INSIGNIFICANT GROWTH Performed at [facility], [address], [place], [place] [address]    Report Status 05/18/2024 FINAL  Final  Resp panel by RT-PCR (RSV, Flu A&B, Covid) Urine, Clean Catch     Status: None   Collection Time: 05/17/24 12:30 PM   Specimen: Urine, Clean Catch; Nasal Swab  Result Value Ref Range Status   SARS Coronavirus 2 by RT PCR NEGATIVE  NEGATIVE Final   Influenza A by PCR NEGATIVE NEGATIVE Final   Influenza B by PCR NEGATIVE NEGATIVE Final    Comment: (NOTE) The Xpert Xpress SARS-CoV-2/FLU/RSV plus assay is intended as an aid in the diagnosis of influenza from Nasopharyngeal swab specimens and should not be used as a sole basis for treatment. Nasal washings and aspirates are unacceptable for Xpert Xpress SARS-CoV-2/FLU/RSV testing.  Fact Sheet for Patients: BloggerCourse.com  Fact Sheet for Healthcare Providers: SeriousBroker.it  This  test is not yet approved or cleared by the [place]  FDA and has been authorized for detection and/or diagnosis of SARS-CoV-2 by FDA under an Emergency Use Authorization (EUA). This EUA will remain in effect (meaning this test can be used) for the duration of the COVID-19 declaration under Section 564(b)(1) of the Act, 21 U.S.C. section 360bbb-3(b)(1), unless the authorization is terminated or revoked.     Resp Syncytial Virus by PCR NEGATIVE NEGATIVE Final    Comment: (NOTE) Fact Sheet for Patients: BloggerCourse.com  Fact Sheet for Healthcare Providers: SeriousBroker.it  This test is not yet approved or cleared by the [place]  FDA and has been authorized for detection and/or diagnosis of SARS-CoV-2 by FDA under an Emergency Use Authorization (EUA). This EUA will remain in effect (meaning this test can be used) for the duration of the COVID-19 declaration under Section 564(b)(1) of the Act, 21 U.S.C. section 360bbb-3(b)(1), unless the authorization is terminated or revoked.  Performed at [facility], [address] [place], [place] [address]   Culture, blood (routine x 2)     Status: None   Collection Time: 05/17/24 12:31 PM   Specimen: BLOOD  Result Value Ref Range Status   Specimen Description BLOOD RIGHT ANTECUBITAL  Final   Special Requests   Final    BOTTLES DRAWN AEROBIC AND ANAEROBIC Blood Culture results may not be optimal due to an inadequate volume of blood received in culture bottles   Culture   Final    NO GROWTH 5 DAYS Performed at [facility], [address], [place], [place] [address]    Report Status 05/22/2024 FINAL  Final  Culture, blood (routine x 2)     Status: None   Collection Time: 05/17/24  2:20 PM   Specimen: BLOOD  Result Value Ref Range Status   Specimen Description BLOOD LEFT ANTECUBITAL  Final   Special Requests   Final    BOTTLES DRAWN AEROBIC AND ANAEROBIC Blood  Culture results may not be optimal due to an inadequate volume of blood received in culture bottles   Culture   Final    NO GROWTH 5 DAYS Performed at [facility], [address] [place], [place] [address]    Report Status 05/22/2024 FINAL  Final  MRSA Next Gen by PCR, Nasal     Status: None   Collection Time: 05/19/24  6:15 AM   Specimen: Nasal Mucosa; Nasal Swab  Result Value Ref Range Status   MRSA by PCR Next Gen NOT DETECTED NOT DETECTED Final    Comment: (NOTE) The GeneXpert MRSA Assay (FDA approved for NASAL specimens only), is one component of a comprehensive MRSA colonization surveillance program. It is not intended to diagnose MRSA infection nor to guide or monitor treatment for MRSA infections. Test performance is not FDA approved in patients less than 39 years old. Performed at [facility], [address] [place], [place] [address]      Labs: BNP (last 3 results) Recent  Labs    05/17/24 1230  BNP 54.3   Basic Metabolic Panel: Recent Labs  Lab 05/17/24 1230 05/17/24 1316 05/18/24 0406  NA  --  143 143  K  --  3.8 3.9  CL  --  111 109  CO2  --   --  24  GLUCOSE  --  95 107*  BUN  --  39* 23  CREATININE  --  0.90 0.90  CALCIUM  --   --  9.4  MG 2.2  --  2.1   Liver Function Tests: Recent Labs  Lab 05/18/24 0406  AST 27  ALT 31  ALKPHOS 44  BILITOT 0.6  PROT 6.5  ALBUMIN 3.9   No results for input(s): LIPASE, AMYLASE in the last 168 hours. Recent Labs  Lab 05/18/24 0406  AMMONIA 20   CBC: Recent Labs  Lab 05/17/24 1230 05/17/24 1316 05/18/24 0406  WBC 4.1  --  5.7  NEUTROABS 2.4  --   --   HGB 11.9* 11.6* 12.6  HCT 36.7 34.0* 37.6  MCV 96.8  --  93.8  PLT 199  --  210   Cardiac Enzymes: No results for input(s): CKTOTAL, CKMB, CKMBINDEX, TROPONINI in the last 168 hours. BNP: Invalid input(s): POCBNP CBG: Recent Labs  Lab 05/20/24 2001 05/21/24 0737 05/21/24 1133 05/21/24 1621 05/21/24 2013   GLUCAP 114* 107* 116* 103* 120*   D-Dimer No results for input(s): DDIMER in the last 72 hours. Hgb A1c No results for input(s): HGBA1C in the last 72 hours. Lipid Profile No results for input(s): CHOL, HDL, LDLCALC, TRIG, CHOLHDL, LDLDIRECT in the last 72 hours. Thyroid  function studies No results for input(s): TSH, T4TOTAL, T3FREE, THYROIDAB in the last 72 hours.  Invalid input(s): FREET3 Anemia work up No results for input(s): VITAMINB12, FOLATE, FERRITIN, TIBC, IRON, RETICCTPCT in the last 72 hours. Urinalysis    Component Value Date/Time   COLORURINE YELLOW 05/17/2024 1229   APPEARANCEUR CLEAR 05/17/2024 1229   LABSPEC 1.014 05/17/2024 1229   PHURINE 5.0 05/17/2024 1229   GLUCOSEU NEGATIVE 05/17/2024 1229   GLUCOSEU NEGATIVE 07/14/2022 1600   HGBUR NEGATIVE 05/17/2024 1229   BILIRUBINUR NEGATIVE 05/17/2024 1229   BILIRUBINUR Negative 02/05/2023 1402   KETONESUR NEGATIVE 05/17/2024 1229   PROTEINUR NEGATIVE 05/17/2024 1229   UROBILINOGEN 0.2 02/05/2023 1402   UROBILINOGEN 0.2 07/14/2022 1600   NITRITE NEGATIVE 05/17/2024 1229   LEUKOCYTESUR SMALL (A) 05/17/2024 1229   Sepsis Labs Recent Labs  Lab 05/17/24 1230 05/18/24 0406  WBC 4.1 5.7   Microbiology Recent Results (from the past 240 hours)  Resp panel by RT-PCR (RSV, Flu A&B, Covid) Anterior Nasal Swab     Status: None   Collection Time: 05/13/24 12:25 PM   Specimen: Anterior Nasal Swab  Result Value Ref Range Status   SARS Coronavirus 2 by RT PCR NEGATIVE NEGATIVE Final    Comment: (NOTE) SARS-CoV-2 target nucleic acids are NOT DETECTED.  The SARS-CoV-2 RNA is generally detectable in upper respiratory specimens during the acute phase of infection. The lowest concentration of SARS-CoV-2 viral copies this assay can detect is 138 copies/mL. A negative result does not preclude SARS-Cov-2 infection and should not be used as the sole basis for treatment or other patient  management decisions. A negative result may occur with  improper specimen collection/handling, submission of specimen other than nasopharyngeal swab, presence of viral mutation(s) within the areas targeted by this assay, and inadequate number of viral copies(<138 copies/mL). A negative result must be combined  with clinical observations, patient history, and epidemiological information. The expected result is Negative.  Fact Sheet for Patients:  BloggerCourse.com  Fact Sheet for Healthcare Providers:  SeriousBroker.it  This test is no t yet approved or cleared by the [place]  FDA and  has been authorized for detection and/or diagnosis of SARS-CoV-2 by FDA under an Emergency Use Authorization (EUA). This EUA will remain  in effect (meaning this test can be used) for the duration of the COVID-19 declaration under Section 564(b)(1) of the Act, 21 U.S.C.section 360bbb-3(b)(1), unless the authorization is terminated  or revoked sooner.       Influenza A by PCR NEGATIVE NEGATIVE Final   Influenza B by PCR NEGATIVE NEGATIVE Final    Comment: (NOTE) The Xpert Xpress SARS-CoV-2/FLU/RSV plus assay is intended as an aid in the diagnosis of influenza from Nasopharyngeal swab specimens and should not be used as a sole basis for treatment. Nasal washings and aspirates are unacceptable for Xpert Xpress SARS-CoV-2/FLU/RSV testing.  Fact Sheet for Patients: BloggerCourse.com  Fact Sheet for Healthcare Providers: SeriousBroker.it  This test is not yet approved or cleared by the [place]  FDA and has been authorized for detection and/or diagnosis of SARS-CoV-2 by FDA under an Emergency Use Authorization (EUA). This EUA will remain in effect (meaning this test can be used) for the duration of the COVID-19 declaration under Section 564(b)(1) of the Act, 21 U.S.C. section 360bbb-3(b)(1),  unless the authorization is terminated or revoked.     Resp Syncytial Virus by PCR NEGATIVE NEGATIVE Final    Comment: (NOTE) Fact Sheet for Patients: BloggerCourse.com  Fact Sheet for Healthcare Providers: SeriousBroker.it  This test is not yet approved or cleared by the [place]  FDA and has been authorized for detection and/or diagnosis of SARS-CoV-2 by FDA under an Emergency Use Authorization (EUA). This EUA will remain in effect (meaning this test can be used) for the duration of the COVID-19 declaration under Section 564(b)(1) of the Act, 21 U.S.C. section 360bbb-3(b)(1), unless the authorization is terminated or revoked.  Performed at [facility], [address] [place], [place] [address]   Urine Culture     Status: Abnormal   Collection Time: 05/17/24 12:29 PM   Specimen: Urine, Clean Catch  Result Value Ref Range Status   Specimen Description URINE, CLEAN CATCH  Final   Special Requests NONE  Final   Culture (A)  Final    <10,000 COLONIES/mL INSIGNIFICANT GROWTH Performed at [facility], [address], [place], [place] [address]    Report Status 05/18/2024 FINAL  Final  Resp panel by RT-PCR (RSV, Flu A&B, Covid) Urine, Clean Catch     Status: None   Collection Time: 05/17/24 12:30 PM   Specimen: Urine, Clean Catch; Nasal Swab  Result Value Ref Range Status   SARS Coronavirus 2 by RT PCR NEGATIVE NEGATIVE Final   Influenza A by PCR NEGATIVE NEGATIVE Final   Influenza B by PCR NEGATIVE NEGATIVE Final    Comment: (NOTE) The Xpert Xpress SARS-CoV-2/FLU/RSV plus assay is intended as an aid in the diagnosis of influenza from Nasopharyngeal swab specimens and should not be used as a sole basis for treatment. Nasal washings and aspirates are unacceptable for Xpert Xpress SARS-CoV-2/FLU/RSV testing.  Fact Sheet for Patients: BloggerCourse.com  Fact Sheet for  Healthcare Providers: SeriousBroker.it  This test is not yet approved or cleared by the [place]  FDA and has been authorized for detection and/or diagnosis of SARS-CoV-2 by FDA under an Emergency Use  Authorization (EUA). This EUA will remain in effect (meaning this test can be used) for the duration of the COVID-19 declaration under Section 564(b)(1) of the Act, 21 U.S.C. section 360bbb-3(b)(1), unless the authorization is terminated or revoked.     Resp Syncytial Virus by PCR NEGATIVE NEGATIVE Final    Comment: (NOTE) Fact Sheet for Patients: BloggerCourse.com  Fact Sheet for Healthcare Providers: SeriousBroker.it  This test is not yet approved or cleared by the [place]  FDA and has been authorized for detection and/or diagnosis of SARS-CoV-2 by FDA under an Emergency Use Authorization (EUA). This EUA will remain in effect (meaning this test can be used) for the duration of the COVID-19 declaration under Section 564(b)(1) of the Act, 21 U.S.C. section 360bbb-3(b)(1), unless the authorization is terminated or revoked.  Performed at [facility], [address], [place], [place] [address]   Culture, blood (routine x 2)     Status: None   Collection Time: 05/17/24 12:31 PM   Specimen: BLOOD  Result Value Ref Range Status   Specimen Description BLOOD RIGHT ANTECUBITAL  Final   Special Requests   Final    BOTTLES DRAWN AEROBIC AND ANAEROBIC Blood Culture results may not be optimal due to an inadequate volume of blood received in culture bottles   Culture   Final    NO GROWTH 5 DAYS Performed at [facility], [address] [place], [place] [address]    Report Status 05/22/2024 FINAL  Final  Culture, blood (routine x 2)     Status: None   Collection Time: 05/17/24  2:20 PM   Specimen: BLOOD  Result Value Ref Range Status   Specimen Description BLOOD LEFT ANTECUBITAL  Final    Special Requests   Final    BOTTLES DRAWN AEROBIC AND ANAEROBIC Blood Culture results may not be optimal due to an inadequate volume of blood received in culture bottles   Culture   Final    NO GROWTH 5 DAYS Performed at [facility], [address], [place], [place] [address]    Report Status 05/22/2024 FINAL  Final  MRSA Next Gen by PCR, Nasal     Status: None   Collection Time: 05/19/24  6:15 AM   Specimen: Nasal Mucosa; Nasal Swab  Result Value Ref Range Status   MRSA by PCR Next Gen NOT DETECTED NOT DETECTED Final    Comment: (NOTE) The GeneXpert MRSA Assay (FDA approved for NASAL specimens only), is one component of a comprehensive MRSA colonization surveillance program. It is not intended to diagnose MRSA infection nor to guide or monitor treatment for MRSA infections. Test performance is not FDA approved in patients less than 51 years old. Performed at [facility], [address] [place], [place] [address]      Time coordinating discharge: 35 minutes  SIGNED:   Sophie Mao, MD  [facility] 05/22/2024, 11:50 AM

## 2024-05-22 NOTE — Progress Notes (Signed)
 Mobility Specialist Progress Note:    05/22/24 1147  Mobility  Activity Stood at bedside (x2)  Level of Assistance Moderate assist, patient does 50-74%  Assistive Device Front wheel walker  Activity Response Tolerated fair  Mobility Referral Yes  Mobility visit 1 Mobility  Mobility Specialist Start Time (ACUTE ONLY) [id number]  Mobility Specialist Stop Time (ACUTE ONLY) [id number]  Mobility Specialist Time Calculation (min) (ACUTE ONLY) 18 min   Pt received in bed agreeable to mobility. Pt required MinA for bed mobility. Attempted x2 stands w/ ModA, pt demonstrated strong posterior lean unable to get away from the bed. During session pt needed Min VC for redirection. Deferred further mobility. Left in bed w/ call bell and personal belongings in reach. Bed alarm on.  Thersia Minder Mobility Specialist  Please contact vis Secure Chat or  Rehab Office [phone]

## 2024-05-22 NOTE — Plan of Care (Signed)
  Problem: Safety: Goal: Ability to remain free from injury will improve Outcome: Progressing   Problem: Urinary Elimination: Goal: Signs and symptoms of infection will decrease Outcome: Progressing   

## 2024-05-22 NOTE — Plan of Care (Signed)
   Problem: Safety: Goal: Ability to remain free from injury will improve Outcome: Progressing

## 2024-05-22 NOTE — Progress Notes (Signed)
 Physical Therapy Treatment Patient Details Name: [name] MRN: [id number] DOB: 1936-07-29 Today's Date: 05/22/2024   History of Present Illness 88 y.o. presented 9/3 with worsening confusion. UA suggestive of UTI. PMH includes chronic systolic and diastolic heart failure, HLD, HTN and dementia.    PT Comments  Pt seen for PT tx with pt agreeable. Pt is pleasant, presents with significant cognitive impairments, requires max multimodal cuing with poor return demo. Pt requires max assist for bed mobility, max assist for sit>stand from significantly elevated EOB, & max assist for stand pivot bed>recliner on L. Pt with R lateral lean in sitting/standing, nurse made aware. Pt with poor awareness of foot placement to assist with balance/stability during transfers. Recommend ongoing PT services to progress mobility as able. Recommend post acute rehab <3 hours therapy/day upon d/c as well.    If plan is discharge home, recommend the following: Direct supervision/assist for medications management;Direct supervision/assist for financial management;Assist for transportation;Supervision due to cognitive status;Two people to help with bathing/dressing/bathroom;Two people to help with walking and/or transfers   Can travel by private vehicle     No  Equipment Recommendations  Other (comment) (defer to next venue)    Recommendations for Other Services       Precautions / Restrictions Precautions Precautions: Fall Recall of Precautions/Restrictions: Impaired Restrictions Weight Bearing Restrictions Per Provider Order: No     Mobility  Bed Mobility Overal bed mobility: Needs Assistance Bed Mobility: Supine to Sit     Supine to sit: Max assist, HOB elevated, Used rails (cuing for hand placement, sequencing, assistance to fully scoot to sitting EOB & upright trunk)          Transfers Overall transfer level: Needs assistance Equipment used: Rolling walker (2 wheels) Transfers: Sit to/from  Stand, Bed to chair/wheelchair/BSC Sit to Stand: Max assist Stand pivot transfers: Max assist, Total assist         General transfer comment: Pt transferred sit>Stand from EOB x ~3 times with max cuing re: hand placement, significantly elevated EOB, cuing for BLE placement to move R foot out for wider BOS. Pt attempts stand pivot bed>Recliner on L with RW then without AD with MAX cuing re: hand placement & sequencing, pt eventually able to progress to turning (limited ability to step with feet) & appears to be more limited by cognition, but also pretty limited by physical deficits.    Ambulation/Gait                   Stairs             Wheelchair Mobility     Tilt Bed    Modified Rankin (Stroke Patients Only)       Balance Overall balance assessment: Needs assistance Sitting-balance support: Feet supported, Bilateral upper extremity supported Sitting balance-Leahy Scale: Poor   Postural control: Right lateral lean Standing balance support: Bilateral upper extremity supported, During functional activity, Reliant on assistive device for balance Standing balance-Leahy Scale: Zero                              Communication Communication Communication: Impaired Factors Affecting Communication: Hearing impaired  Cognition Arousal: Alert Behavior During Therapy: WFL for tasks assessed/performed   PT - Cognitive impairments: No family/caregiver present to determine baseline, Orientation, Awareness, Memory, Safety/Judgement, Problem solving, Attention, Initiation, Sequencing  PT - Cognition Comments: VERY poor ability to follow simple multimodal cuing throughout session Following commands: Impaired Following commands impaired: Follows one step commands inconsistently    Cueing Cueing Techniques: Gestural cues, Tactile cues, Visual cues, Verbal cues  Exercises      General Comments        Pertinent Vitals/Pain  Pain Assessment Pain Assessment: No/denies pain    Home Living                          Prior Function            PT Goals (current goals can now be found in the care plan section) Acute Rehab PT Goals Patient Stated Goal: Get well PT Goal Formulation: With patient Time For Goal Achievement: 06/01/24 Potential to Achieve Goals: Good Progress towards PT goals: Progressing toward goals    Frequency    Min 2X/week      PT Plan      Co-evaluation              AM-PAC PT 6 Clicks Mobility   Outcome Measure  Help needed turning from your back to your side while in a flat bed without using bedrails?: A Lot Help needed moving from lying on your back to sitting on the side of a flat bed without using bedrails?: Total Help needed moving to and from a bed to a chair (including a wheelchair)?: Total Help needed standing up from a chair using your arms (e.g., wheelchair or bedside chair)?: Total Help needed to walk in hospital room?: Total Help needed climbing 3-5 steps with a railing? : Total 6 Click Score: 7    End of Session Equipment Utilized During Treatment: Gait belt Activity Tolerance: Patient tolerated treatment well Patient left: in chair;with call bell/phone within reach;with chair alarm set Nurse Communication: Mobility status (R lateral lean) PT Visit Diagnosis: Unsteadiness on feet (R26.81);Other abnormalities of gait and mobility (R26.89);Muscle weakness (generalized) (M62.81);Other symptoms and signs involving the nervous system (R29.898);Difficulty in walking, not elsewhere classified (R26.2)     Time: 8549-8491 PT Time Calculation (min) (ACUTE ONLY): 18 min  Charges:    $Therapeutic Activity: 8-22 mins PT General Charges $$ ACUTE PT VISIT: 1 Visit                     Richerd Pinal, PT, DPT 05/22/24, 3:18 PM   Richerd CHRISTELLA Pinal 05/22/2024, 3:16 PM

## 2024-05-23 DIAGNOSIS — R2681 Unsteadiness on feet: Secondary | ICD-10-CM | POA: Diagnosis not present

## 2024-05-23 DIAGNOSIS — Z85828 Personal history of other malignant neoplasm of skin: Secondary | ICD-10-CM | POA: Diagnosis not present

## 2024-05-23 DIAGNOSIS — I110 Hypertensive heart disease with heart failure: Secondary | ICD-10-CM | POA: Diagnosis not present

## 2024-05-23 DIAGNOSIS — G9341 Metabolic encephalopathy: Secondary | ICD-10-CM | POA: Diagnosis not present

## 2024-05-23 DIAGNOSIS — I5042 Chronic combined systolic (congestive) and diastolic (congestive) heart failure: Secondary | ICD-10-CM | POA: Diagnosis not present

## 2024-05-23 DIAGNOSIS — N390 Urinary tract infection, site not specified: Secondary | ICD-10-CM | POA: Diagnosis not present

## 2024-05-23 DIAGNOSIS — E785 Hyperlipidemia, unspecified: Secondary | ICD-10-CM | POA: Diagnosis not present

## 2024-05-23 DIAGNOSIS — R2689 Other abnormalities of gait and mobility: Secondary | ICD-10-CM | POA: Diagnosis not present

## 2024-05-23 DIAGNOSIS — G9340 Encephalopathy, unspecified: Secondary | ICD-10-CM | POA: Diagnosis not present

## 2024-05-23 DIAGNOSIS — M6281 Muscle weakness (generalized): Secondary | ICD-10-CM | POA: Diagnosis not present

## 2024-05-23 NOTE — Plan of Care (Signed)
  Problem: Safety: Goal: Ability to remain free from injury will improve Outcome: Progressing   Problem: Urinary Elimination: Goal: Signs and symptoms of infection will decrease Outcome: Progressing   

## 2024-05-23 NOTE — Progress Notes (Signed)
 PROGRESS NOTE    [name]  FMW:[id number] DOB: August 24, 1936 DOA: 05/17/2024 PCP: Amon Aloysius BRAVO, MD   Brief Narrative:   88 y.o. female with medical history significant of dementia, hypertension, hyperlipidemia, chronic systolic and diastolic heart failure presented with worsening confusion.  On presentation, patient was mildly tachycardic and tachypneic with normal WBCs, renal function and chest x-ray.  UA suggestive of UTI.  She was started on IV antibiotics.  PT recommended SNF placement.  Currently medically stable for discharge to SNF.  Assessment & Plan:   Acute metabolic encephalopathy - Possibly from UTI.  Recent CT head on 05/13/2024 was negative for acute intracranial abnormality. - Continue IV antibiotics.   - Fall precautions.  Mental status improving.  Possibly back to baseline. -PT recommending SNF placement.  TOC following.  Diet as per SLP recommendations - monitor mental status   UTI: Present on admission - Completed 7 days of IV Rocephin .  DC antibiotics.  Urine cultures grew less than 10,000 colonies per mL insignificant growth.    History of dementia with anxiety - Continue memantine .  Continue Effexor   Hypertension - Blood pressure improving.  Continue metoprolol  for now.  Monitor blood pressure.  Hyperlipidemia - Continue simvastatin    DVT prophylaxis: Lovenox  Code Status: DNR.  Family Communication: Daughter at bedside on 05/17/2024 Disposition Plan: Status is: Observation The patient will require care spanning > 2 midnights and should be moved to inpatient because: Of severity of illness.  Need for SNF placement.  Still on IV antibiotics.  Currently medically stable for discharge to SNF.   Consultants: None  Procedures: None  Antimicrobials: Rocephin  from 05/17/2024 onwards   Subjective: Patient seen and examined at bedside.  No fever, vomiting, agitation reported.  Objective: Vitals:   05/22/24 1600 05/22/24 2007 05/23/24 0433 05/23/24 0817  BP:  107/71 114/63 109/67 124/69  Pulse: 83 83 82 85  Resp: 18 20 20 17   Temp: 98.7 F (37.1 C) 98.1 F (36.7 C) 99 F (37.2 C)   TempSrc: Oral Oral Oral   SpO2: 93% 95% 95% 93%  Weight:        Intake/Output Summary (Last 24 hours) at 05/23/2024 0841 Last data filed at 05/23/2024 0457 Gross per 24 hour  Intake 820 ml  Output 400 ml  Net 420 ml   Filed Weights   05/18/24 0521  Weight: 84.1 kg    Examination:  General: On room air and currently in no distress.  Remains pleasantly confused.  Still slow to respond.  Poor historian. respiratory: Decreased breath sounds at bases bilaterally with some crackles CVS:  S1-S2 heard; currently rate controlled abdominal: Soft, nontender, remains slightly distended;; no organomegaly, bowel sounds are normally heard extremities: Trace lower extremity edema; no cyanosis   Data Reviewed: I have personally reviewed following labs and imaging studies  CBC: Recent Labs  Lab 05/17/24 1230 05/17/24 1316 05/18/24 0406  WBC 4.1  --  5.7  NEUTROABS 2.4  --   --   HGB 11.9* 11.6* 12.6  HCT 36.7 34.0* 37.6  MCV 96.8  --  93.8  PLT 199  --  210   Basic Metabolic Panel: Recent Labs  Lab 05/17/24 1230 05/17/24 1316 05/18/24 0406  NA  --  143 143  K  --  3.8 3.9  CL  --  111 109  CO2  --   --  24  GLUCOSE  --  95 107*  BUN  --  39* 23  CREATININE  --  0.90  0.90  CALCIUM  --   --  9.4  MG 2.2  --  2.1   GFR: Estimated Creatinine Clearance: 50 mL/min (by C-G formula based on SCr of 0.9 mg/dL). Liver Function Tests: Recent Labs  Lab 05/18/24 0406  AST 27  ALT 31  ALKPHOS 44  BILITOT 0.6  PROT 6.5  ALBUMIN 3.9   No results for input(s): LIPASE, AMYLASE in the last 168 hours.  Recent Labs  Lab 05/18/24 0406  AMMONIA 20   Coagulation Profile: No results for input(s): INR, PROTIME in the last 168 hours. Cardiac Enzymes: No results for input(s): CKTOTAL, CKMB, CKMBINDEX, TROPONINI in the last 168 hours. BNP  (last 3 results) No results for input(s): PROBNP in the last 8760 hours. HbA1C: No results for input(s): HGBA1C in the last 72 hours. CBG: Recent Labs  Lab 05/20/24 2001 05/21/24 0737 05/21/24 1133 05/21/24 1621 05/21/24 2013  GLUCAP 114* 107* 116* 103* 120*   Lipid Profile: No results for input(s): CHOL, HDL, LDLCALC, TRIG, CHOLHDL, LDLDIRECT in the last 72 hours. Thyroid  Function Tests: No results for input(s): TSH, T4TOTAL, FREET4, T3FREE, THYROIDAB in the last 72 hours.  Anemia Panel: No results for input(s): VITAMINB12, FOLATE, FERRITIN, TIBC, IRON, RETICCTPCT in the last 72 hours.  Sepsis Labs: Recent Labs  Lab 05/17/24 1316  LATICACIDVEN 0.5    Recent Results (from the past 240 hours)  Resp panel by RT-PCR (RSV, Flu A&B, Covid) Anterior Nasal Swab     Status: None   Collection Time: 05/13/24 12:25 PM   Specimen: Anterior Nasal Swab  Result Value Ref Range Status   SARS Coronavirus 2 by RT PCR NEGATIVE NEGATIVE Final    Comment: (NOTE) SARS-CoV-2 target nucleic acids are NOT DETECTED.  The SARS-CoV-2 RNA is generally detectable in upper respiratory specimens during the acute phase of infection. The lowest concentration of SARS-CoV-2 viral copies this assay can detect is 138 copies/mL. A negative result does not preclude SARS-Cov-2 infection and should not be used as the sole basis for treatment or other patient management decisions. A negative result may occur with  improper specimen collection/handling, submission of specimen other than nasopharyngeal swab, presence of viral mutation(s) within the areas targeted by this assay, and inadequate number of viral copies(<138 copies/mL). A negative result must be combined with clinical observations, patient history, and epidemiological information. The expected result is Negative.  Fact Sheet for Patients:  BloggerCourse.com  Fact Sheet for Healthcare  Providers:  SeriousBroker.it  This test is no t yet approved or cleared by the [place]  FDA and  has been authorized for detection and/or diagnosis of SARS-CoV-2 by FDA under an Emergency Use Authorization (EUA). This EUA will remain  in effect (meaning this test can be used) for the duration of the COVID-19 declaration under Section 564(b)(1) of the Act, 21 U.S.C.section 360bbb-3(b)(1), unless the authorization is terminated  or revoked sooner.       Influenza A by PCR NEGATIVE NEGATIVE Final   Influenza B by PCR NEGATIVE NEGATIVE Final    Comment: (NOTE) The Xpert Xpress SARS-CoV-2/FLU/RSV plus assay is intended as an aid in the diagnosis of influenza from Nasopharyngeal swab specimens and should not be used as a sole basis for treatment. Nasal washings and aspirates are unacceptable for Xpert Xpress SARS-CoV-2/FLU/RSV testing.  Fact Sheet for Patients: BloggerCourse.com  Fact Sheet for Healthcare Providers: SeriousBroker.it  This test is not yet approved or cleared by the [place]  FDA and has been authorized for detection and/or diagnosis of  SARS-CoV-2 by FDA under an Emergency Use Authorization (EUA). This EUA will remain in effect (meaning this test can be used) for the duration of the COVID-19 declaration under Section 564(b)(1) of the Act, 21 U.S.C. section 360bbb-3(b)(1), unless the authorization is terminated or revoked.     Resp Syncytial Virus by PCR NEGATIVE NEGATIVE Final    Comment: (NOTE) Fact Sheet for Patients: BloggerCourse.com  Fact Sheet for Healthcare Providers: SeriousBroker.it  This test is not yet approved or cleared by the [place]  FDA and has been authorized for detection and/or diagnosis of SARS-CoV-2 by FDA under an Emergency Use Authorization (EUA). This EUA will remain in effect (meaning this test can be  used) for the duration of the COVID-19 declaration under Section 564(b)(1) of the Act, 21 U.S.C. section 360bbb-3(b)(1), unless the authorization is terminated or revoked.  Performed at [facility], [address] [place], [place] [address]   Urine Culture     Status: Abnormal   Collection Time: 05/17/24 12:29 PM   Specimen: Urine, Clean Catch  Result Value Ref Range Status   Specimen Description URINE, CLEAN CATCH  Final   Special Requests NONE  Final   Culture (A)  Final    <10,000 COLONIES/mL INSIGNIFICANT GROWTH Performed at [facility], [address] [place], [place] [address]    Report Status 05/18/2024 FINAL  Final  Resp panel by RT-PCR (RSV, Flu A&B, Covid) Urine, Clean Catch     Status: None   Collection Time: 05/17/24 12:30 PM   Specimen: Urine, Clean Catch; Nasal Swab  Result Value Ref Range Status   SARS Coronavirus 2 by RT PCR NEGATIVE NEGATIVE Final   Influenza A by PCR NEGATIVE NEGATIVE Final   Influenza B by PCR NEGATIVE NEGATIVE Final    Comment: (NOTE) The Xpert Xpress SARS-CoV-2/FLU/RSV plus assay is intended as an aid in the diagnosis of influenza from Nasopharyngeal swab specimens and should not be used as a sole basis for treatment. Nasal washings and aspirates are unacceptable for Xpert Xpress SARS-CoV-2/FLU/RSV testing.  Fact Sheet for Patients: BloggerCourse.com  Fact Sheet for Healthcare Providers: SeriousBroker.it  This test is not yet approved or cleared by the [place]  FDA and has been authorized for detection and/or diagnosis of SARS-CoV-2 by FDA under an Emergency Use Authorization (EUA). This EUA will remain in effect (meaning this test can be used) for the duration of the COVID-19 declaration under Section 564(b)(1) of the Act, 21 U.S.C. section 360bbb-3(b)(1), unless the authorization is terminated or revoked.     Resp Syncytial Virus by PCR NEGATIVE  NEGATIVE Final    Comment: (NOTE) Fact Sheet for Patients: BloggerCourse.com  Fact Sheet for Healthcare Providers: SeriousBroker.it  This test is not yet approved or cleared by the [place]  FDA and has been authorized for detection and/or diagnosis of SARS-CoV-2 by FDA under an Emergency Use Authorization (EUA). This EUA will remain in effect (meaning this test can be used) for the duration of the COVID-19 declaration under Section 564(b)(1) of the Act, 21 U.S.C. section 360bbb-3(b)(1), unless the authorization is terminated or revoked.  Performed at [facility], [address] [place], [place] [address]   Culture, blood (routine x 2)     Status: None   Collection Time: 05/17/24 12:31 PM   Specimen: BLOOD  Result Value Ref Range Status   Specimen Description BLOOD RIGHT ANTECUBITAL  Final   Special Requests   Final    BOTTLES DRAWN AEROBIC AND ANAEROBIC Blood Culture  results may not be optimal due to an inadequate volume of blood received in culture bottles   Culture   Final    NO GROWTH 5 DAYS Performed at [facility], [address] [place], [place] [address]    Report Status 05/22/2024 FINAL  Final  Culture, blood (routine x 2)     Status: None   Collection Time: 05/17/24  2:20 PM   Specimen: BLOOD  Result Value Ref Range Status   Specimen Description BLOOD LEFT ANTECUBITAL  Final   Special Requests   Final    BOTTLES DRAWN AEROBIC AND ANAEROBIC Blood Culture results may not be optimal due to an inadequate volume of blood received in culture bottles   Culture   Final    NO GROWTH 5 DAYS Performed at [facility], [address], [place], [place] [address]    Report Status 05/22/2024 FINAL  Final  MRSA Next Gen by PCR, Nasal     Status: None   Collection Time: 05/19/24  6:15 AM   Specimen: Nasal Mucosa; Nasal Swab  Result Value Ref Range Status   MRSA by PCR Next Gen NOT DETECTED NOT DETECTED  Final    Comment: (NOTE) The GeneXpert MRSA Assay (FDA approved for NASAL specimens only), is one component of a comprehensive MRSA colonization surveillance program. It is not intended to diagnose MRSA infection nor to guide or monitor treatment for MRSA infections. Test performance is not FDA approved in patients less than 35 years old. Performed at [facility], [address] [place], [place] [address]          Radiology Studies: No results found.       Scheduled Meds:  enoxaparin  (LOVENOX ) injection  40 mg Subcutaneous Q24H   losartan   25 mg Oral Daily   memantine   5 mg Oral BID   metoprolol  tartrate  25 mg Oral BID   simvastatin   20 mg Oral q1800   venlafaxine  XR  75 mg Oral Q breakfast   Continuous Infusions:  cefTRIAXone  (ROCEPHIN )  IV 1 g (05/23/24 0830)          Sophie Mao, MD [facility] 05/23/2024, 8:41 AM

## 2024-05-23 NOTE — TOC Progression Note (Addendum)
 Transition of Care [facility]) - Progression Note    Patient Details  Name: [name] MRN: [id number] Date of Birth: Jan 19, 1936  [facility] CM/SW Contact  Lendia Dais, [place] Phone Number: 05/23/2024, 10:29 AM  Clinical Narrative:  Pt is discharging to [facility].  CSW received a call from Jenkins (daughter) and CSW informed Jenkins that insurance shara is still pending.   CSW spoke to Darrien at [facility] and stated that they would be ready for the patient when shara is approved. Ref ID number [id number].   [id number] - Auth has been approved 09/09-09/11. Auth ID is [id number]. CSW spoke to Darrien and stated that due to patient having a telesitter, pt will have to be w/o sitter for 48 hours. Darrien confirmed that they would received the patient Thursday 09/11.  CSW will continue to monitor.    Expected Discharge Plan: Skilled Nursing Facility Barriers to Discharge: Continued Medical Work up               Expected Discharge Plan and Services In-house Referral: Clinical Social Work     Living arrangements for the past 2 months: Independent Living Facility                                       Social Drivers of Health (SDOH) Interventions SDOH Screenings   Food Insecurity: No Food Insecurity (05/18/2024)  Housing: Low Risk  (05/18/2024)  Transportation Needs: No Transportation Needs (05/18/2024)  Utilities: Not At Risk (05/18/2024)  Alcohol Screen: Low Risk  (08/19/2023)  Depression (PHQ2-9): Low Risk  (08/19/2023)  Financial Resource Strain: Low Risk  (08/19/2023)  Physical Activity: Inactive (08/19/2023)  Social Connections: Moderately Integrated (05/18/2024)  Stress: No Stress Concern Present (08/19/2023)  Tobacco Use: Low Risk  (05/19/2024)  Health Literacy: Adequate Health Literacy (08/19/2023)    Readmission Risk Interventions     No data to display

## 2024-05-23 NOTE — Plan of Care (Signed)
   Problem: Safety: Goal: Ability to remain free from injury will improve Outcome: Progressing

## 2024-05-24 DIAGNOSIS — G9340 Encephalopathy, unspecified: Secondary | ICD-10-CM | POA: Diagnosis not present

## 2024-05-24 DIAGNOSIS — R2689 Other abnormalities of gait and mobility: Secondary | ICD-10-CM | POA: Diagnosis not present

## 2024-05-24 DIAGNOSIS — E785 Hyperlipidemia, unspecified: Secondary | ICD-10-CM | POA: Diagnosis not present

## 2024-05-24 DIAGNOSIS — I5042 Chronic combined systolic (congestive) and diastolic (congestive) heart failure: Secondary | ICD-10-CM | POA: Diagnosis not present

## 2024-05-24 DIAGNOSIS — N390 Urinary tract infection, site not specified: Secondary | ICD-10-CM | POA: Diagnosis not present

## 2024-05-24 DIAGNOSIS — R2681 Unsteadiness on feet: Secondary | ICD-10-CM | POA: Diagnosis not present

## 2024-05-24 DIAGNOSIS — I110 Hypertensive heart disease with heart failure: Secondary | ICD-10-CM | POA: Diagnosis not present

## 2024-05-24 DIAGNOSIS — Z85828 Personal history of other malignant neoplasm of skin: Secondary | ICD-10-CM | POA: Diagnosis not present

## 2024-05-24 DIAGNOSIS — G9341 Metabolic encephalopathy: Secondary | ICD-10-CM | POA: Diagnosis not present

## 2024-05-24 DIAGNOSIS — M6281 Muscle weakness (generalized): Secondary | ICD-10-CM | POA: Diagnosis not present

## 2024-05-24 MED ORDER — IPRATROPIUM-ALBUTEROL 0.5-2.5 (3) MG/3ML IN SOLN: 3.0000 mL | RESPIRATORY_TRACT | Status: DC | PRN

## 2024-05-24 MED ORDER — TRAZODONE HCL 50 MG PO TABS: 50.0000 mg | ORAL_TABLET | Freq: Every evening | ORAL | Status: DC | PRN

## 2024-05-24 MED ORDER — METOPROLOL TARTRATE 5 MG/5ML IV SOLN: 5.0000 mg | INTRAVENOUS | Status: DC | PRN

## 2024-05-24 MED ORDER — HYDRALAZINE HCL 20 MG/ML IJ SOLN: 10.0000 mg | INTRAMUSCULAR | Status: DC | PRN

## 2024-05-24 MED ORDER — ORAL CARE MOUTH RINSE: 15.0000 mL | OROMUCOSAL | Status: DC | PRN

## 2024-05-24 NOTE — Progress Notes (Signed)
 Physical Therapy Treatment Patient Details Name: [name] MRN: [id number] DOB: 03-Apr-1936 Today's Date: 05/24/2024   History of Present Illness 88 y.o. presented 9/3 with worsening confusion. UA suggestive of UTI. PMH includes chronic systolic and diastolic heart failure, HLD, HTN and dementia.    PT Comments  Pt now making steady progress with mobility but not close to baseline. Patient will benefit from continued inpatient follow up therapy, <3 hours/day     If plan is discharge home, recommend the following: A lot of help with walking and/or transfers;A lot of help with bathing/dressing/bathroom;Assist for transportation   Can travel by private vehicle     No  Equipment Recommendations  None recommended by PT    Recommendations for Other Services       Precautions / Restrictions Precautions Precautions: Fall Recall of Precautions/Restrictions: Impaired Restrictions Weight Bearing Restrictions Per Provider Order: No     Mobility  Bed Mobility Overal bed mobility: Needs Assistance Bed Mobility: Supine to Sit     Supine to sit: Mod assist, HOB elevated     General bed mobility comments: Assist to bring legs off of bed, elevate trunk into sitting and scoot hips to EOB.    Transfers Overall transfer level: Needs assistance Equipment used: Rolling walker (2 wheels) Transfers: Sit to/from Stand Sit to Stand: Mod assist           General transfer comment: Assist to power up and stabilize    Ambulation/Gait Ambulation/Gait assistance: Min assist   Assistive device: Rolling walker (2 wheels) Gait Pattern/deviations: Step-to pattern, Decreased step length - left, Decreased step length - right, Shuffle, Trunk flexed Gait velocity: decr Gait velocity interpretation: <1.31 ft/sec, indicative of household ambulator   General Gait Details: Assist for balance and support. Verbal cues to look up and incr step length.   Stairs             Wheelchair  Mobility     Tilt Bed    Modified Rankin (Stroke Patients Only)       Balance Overall balance assessment: Needs assistance Sitting-balance support: Feet supported, Bilateral upper extremity supported Sitting balance-Leahy Scale: Poor Sitting balance - Comments: UE support   Standing balance support: Bilateral upper extremity supported, During functional activity, Reliant on assistive device for balance Standing balance-Leahy Scale: Poor Standing balance comment: Walker and min assist for static standing                            Communication Communication Communication: Impaired Factors Affecting Communication: Hearing impaired  Cognition Arousal: Alert Behavior During Therapy: WFL for tasks assessed/performed   PT - Cognitive impairments: Orientation, Memory, Problem solving, Initiation   Orientation impairments: Time, Situation                   PT - Cognition Comments: Slow to process Following commands: Impaired Following commands impaired: Follows one step commands with increased time    Cueing Cueing Techniques: Verbal cues, Tactile cues  Exercises      General Comments        Pertinent Vitals/Pain Pain Assessment Pain Assessment: No/denies pain    Home Living                          Prior Function            PT Goals (current goals can now be found in the care plan section)  Frequency    Min 2X/week      PT Plan      Co-evaluation              AM-PAC PT 6 Clicks Mobility   Outcome Measure  Help needed turning from your back to your side while in a flat bed without using bedrails?: A Little Help needed moving from lying on your back to sitting on the side of a flat bed without using bedrails?: A Lot Help needed moving to and from a bed to a chair (including a wheelchair)?: A Lot Help needed standing up from a chair using your arms (e.g., wheelchair or bedside chair)?: A Lot Help needed to  walk in hospital room?: A Little Help needed climbing 3-5 steps with a railing? : Total 6 Click Score: 13    End of Session Equipment Utilized During Treatment: Gait belt Activity Tolerance: Patient tolerated treatment well Patient left: in chair;with call bell/phone within reach;with chair alarm set Nurse Communication: Mobility status PT Visit Diagnosis: Unsteadiness on feet (R26.81);Other abnormalities of gait and mobility (R26.89);Muscle weakness (generalized) (M62.81);Other symptoms and signs involving the nervous system (R29.898);Difficulty in walking, not elsewhere classified (R26.2)     Time: 9075-9049 PT Time Calculation (min) (ACUTE ONLY): 26 min  Charges:    $Gait Training: 23-37 mins PT General Charges $$ ACUTE PT VISIT: 1 Visit                     [facility] PT Acute Rehabilitation Services Office [phone]    Rodgers ORN [facility] 05/24/2024, 10:04 AM

## 2024-05-24 NOTE — Hospital Course (Addendum)
 Brief Narrative:   87 y.o. female with medical history significant of dementia, hypertension, hyperlipidemia, chronic systolic and diastolic heart failure presented with worsening confusion.  On presentation, patient was mildly tachycardic and tachypneic with normal WBCs, renal function and chest x-ray.  UA suggestive of UTI.  She was started on IV antibiotics.  PT recommended SNF placement.  Currently medically stable for discharge to SNF.   Assessment & Plan:   Acute metabolic encephalopathy Urinary tract infection -CT of the head is negative.  This is likely secondary urinary tract infection.  Completed 7 days of IV Rocephin .  Mentation is improved.  PT/OT recommending SNF.   History of dementia with anxiety - Continue memantine .  Continue Effexor    Hypertension -On losartan  and Lopressor  IV as needed   Hyperlipidemia - Continue simvastatin    DVT prophylaxis: enoxaparin  (LOVENOX ) injection 40 mg Start: 05/17/24 1545    Code Status: Limited: Do not attempt resuscitation (DNR) -DNR-LIMITED -Do Not Intubate/DNI  Family Communication:  [facility] SNF placement   PT Follow up Recs: Skilled Nursing-Short Term Rehab (<3 Hours/Day)05/22/2024 1515  Subjective:  Doing okay no complaints  Examination:  General exam: Appears calm and comfortable  Respiratory system: Clear to auscultation. Respiratory effort normal. Cardiovascular system: S1 & S2 heard, RRR. No JVD, murmurs, rubs, gallops or clicks. No pedal edema. Gastrointestinal system: Abdomen is nondistended, soft and nontender. No organomegaly or masses felt. Normal bowel sounds heard. Central nervous system: Alert and oriented. No focal neurological deficits. Extremities: Symmetric 5 x 5 power. Skin: No rashes, lesions or ulcers Psychiatry: Judgement and insight appear normal. Mood & affect appropriate.

## 2024-05-24 NOTE — Progress Notes (Signed)
 Occupational Therapy Treatment Patient Details Name: [name] MRN: [id number] DOB: [birth year] Today's Date: 05/24/2024   History of present illness 88 y.o. presented 9/3 with worsening confusion. UA suggestive of UTI. PMH includes chronic systolic and diastolic heart failure, HLD, HTN and dementia.   OT comments  Pt readily willing to participate in ADL training. Increased time, pt thorough with UB bathing and grooming in sitting with assist only to wash back. Moderate assistance to stand and min assist for static standing balance while participating in pericare. Patient will benefit from continued inpatient follow up therapy, <3 hours/day prior to return to her ILF.      If plan is discharge home, recommend the following:  A lot of help with walking and/or transfers;A lot of help with bathing/dressing/bathroom;Assistance with cooking/housework;Direct supervision/assist for medications management;Direct supervision/assist for financial management;Assist for transportation;Help with stairs or ramp for entrance   Equipment Recommendations  None recommended by OT    Recommendations for Other Services      Precautions / Restrictions Precautions Precautions: Fall Recall of Precautions/Restrictions: Impaired Restrictions Weight Bearing Restrictions Per Provider Order: No       Mobility Bed Mobility               General bed mobility comments: received and returned to chair    Transfers Overall transfer level: Needs assistance Equipment used: Rolling walker (2 wheels) Transfers: Sit to/from Stand Sit to Stand: Mod assist           General transfer comment: assist to rise and steady     Balance     Sitting balance-Leahy Scale: Fair Sitting balance - Comments: at edge of chair       Standing balance comment: Walker and min assist for static standing, released walker with one hand for pericare                           ADL either performed or  assessed with clinical judgement   ADL Overall ADL's : Needs assistance/impaired     Grooming: Wash/dry hands;Wash/dry face;Oral care;Brushing hair;Applying deodorant;Sitting;Set up   Upper Body Bathing: Minimal assistance;Sitting Upper Body Bathing Details (indicate cue type and reason): assisted with back     Upper Body Dressing : Set up;Sitting           Toileting- Clothing Manipulation and Hygiene: Moderate assistance;Sit to/from stand              Extremity/Trunk Assessment              Vision       Perception     Praxis     Communication Communication Communication: Impaired Factors Affecting Communication: Hearing impaired   Cognition Arousal: Alert Behavior During Therapy: WFL for tasks assessed/performed Cognition: No family/caregiver present to determine baseline             OT - Cognition Comments: pt with history of dementia, disoriented to all but herself                 Following commands: Impaired Following commands impaired: Follows one step commands with increased time      Cueing   Cueing Techniques: Verbal cues  Exercises      Shoulder Instructions       General Comments      Pertinent Vitals/ Pain       Pain Assessment Pain Assessment: No/denies pain  Home Living  Prior Functioning/Environment              Frequency  Min 2X/week        Progress Toward Goals  OT Goals(current goals can now be found in the care plan section)  Progress towards OT goals: Progressing toward goals  Acute Rehab OT Goals OT Goal Formulation: With patient Time For Goal Achievement: 06/01/24 Potential to Achieve Goals: Good  Plan      Co-evaluation                 AM-PAC OT 6 Clicks Daily Activity     Outcome Measure   Help from another person eating meals?: None Help from another person taking care of personal grooming?: A Little Help from  another person toileting, which includes using toliet, bedpan, or urinal?: A Lot Help from another person bathing (including washing, rinsing, drying)?: A Lot Help from another person to put on and taking off regular upper body clothing?: A Little Help from another person to put on and taking off regular lower body clothing?: A Lot 6 Click Score: 16    End of Session Equipment Utilized During Treatment: Gait belt;Rolling walker (2 wheels)  OT Visit Diagnosis: Unsteadiness on feet (R26.81);Other abnormalities of gait and mobility (R26.89);Muscle weakness (generalized) (M62.81);Other symptoms and signs involving cognitive function   Activity Tolerance Patient tolerated treatment well   Patient Left in chair;with call bell/phone within reach;with chair alarm set   Nurse Communication          Time: 1000-1033 OT Time Calculation (min): 33 min  Charges: OT General Charges $OT Visit: 1 Visit OT Treatments $Self Care/Home Management : 23-37 mins  [name], OTR/L Acute Rehabilitation Services Office: [phone]  [name] 05/24/2024, 10:38 AM

## 2024-05-24 NOTE — Progress Notes (Signed)
 PROGRESS NOTE    [name]  FMW:[id number] DOB: [birth year] DOA: 05/17/2024 PCP: Amon Aloysius BRAVO, MD    Brief Narrative:   88 y.o. female with medical history significant of dementia, hypertension, hyperlipidemia, chronic systolic and diastolic heart failure presented with worsening confusion.  On presentation, patient was mildly tachycardic and tachypneic with normal WBCs, renal function and chest x-ray.  UA suggestive of UTI.  She was started on IV antibiotics.  PT recommended SNF placement.  Currently medically stable for discharge to SNF.   Assessment & Plan:   Acute metabolic encephalopathy Urinary tract infection -CT of the head is negative.  This is likely secondary urinary tract infection.  Completed 7 days of IV Rocephin .  Mentation is improved.  PT/OT recommending SNF.   History of dementia with anxiety - Continue memantine .  Continue Effexor    Hypertension -On losartan  and Lopressor  IV as needed   Hyperlipidemia - Continue simvastatin    DVT prophylaxis: enoxaparin  (LOVENOX ) injection 40 mg Start: 05/17/24 1545    Code Status: Limited: Do not attempt resuscitation (DNR) -DNR-LIMITED -Do Not Intubate/DNI  Family Communication:  Called Ann Pending SNF placement   PT Follow up Recs: Skilled Nursing-Short Term Rehab (<3 Hours/Day)05/22/2024 1515  Subjective:  Sitting up on recliner. No complaints.   Examination:  General exam: Appears calm and comfortable  Respiratory system: Clear to auscultation. Respiratory effort normal. Cardiovascular system: S1 & S2 heard, RRR. No JVD, murmurs, rubs, gallops or clicks. No pedal edema. Gastrointestinal system: Abdomen is nondistended, soft and nontender. No organomegaly or masses felt. Normal bowel sounds heard. Central nervous system: Alert and oriented. No focal neurological deficits. Extremities: Symmetric 5 x 5 power. Skin: No rashes, lesions or ulcers Psychiatry: Judgement and insight appear normal. Mood & affect  appropriate.                Diet Orders (From admission, onward)     Start     Ordered   05/22/24 0000  Diet - low sodium heart healthy        05/22/24 1150   05/17/24 1532  Diet Heart Room service appropriate? Yes; Fluid consistency: Thin  Diet effective now       Question Answer Comment  Room service appropriate? Yes   Fluid consistency: Thin      05/17/24 1533            Objective: Vitals:   05/23/24 1626 05/23/24 2029 05/24/24 0429 05/24/24 0856  BP: 127/65 102/67 (!) 115/57 (!) 123/59  Pulse: 81 96 70 74  Resp: 18 16 20 17   Temp:  97.9 F (36.6 C) (!) 97.5 F (36.4 C) 97.9 F (36.6 C)  TempSrc:  Oral    SpO2: 96% 95% 95% 95%  Weight:        Intake/Output Summary (Last 24 hours) at 05/24/2024 1220 Last data filed at 05/24/2024 0855 Gross per 24 hour  Intake 360 ml  Output 350 ml  Net 10 ml   Filed Weights   05/18/24 0521  Weight: 84.1 kg    Scheduled Meds:  enoxaparin  (LOVENOX ) injection  40 mg Subcutaneous Q24H   losartan   25 mg Oral Daily   memantine   5 mg Oral BID   metoprolol  tartrate  25 mg Oral BID   simvastatin   20 mg Oral q1800   venlafaxine  XR  75 mg Oral Q breakfast   Continuous Infusions:  Nutritional status     Body mass index is 29.04 kg/m.  Data Reviewed:   CBC:  Recent Labs  Lab 05/17/24 1230 05/17/24 1316 05/18/24 0406  WBC 4.1  --  5.7  NEUTROABS 2.4  --   --   HGB 11.9* 11.6* 12.6  HCT 36.7 34.0* 37.6  MCV 96.8  --  93.8  PLT 199  --  210   Basic Metabolic Panel: Recent Labs  Lab 05/17/24 1230 05/17/24 1316 05/18/24 0406  NA  --  143 143  K  --  3.8 3.9  CL  --  111 109  CO2  --   --  24  GLUCOSE  --  95 107*  BUN  --  39* 23  CREATININE  --  0.90 0.90  CALCIUM  --   --  9.4  MG 2.2  --  2.1   GFR: Estimated Creatinine Clearance: 50 mL/min (by C-G formula based on SCr of 0.9 mg/dL). Liver Function Tests: Recent Labs  Lab 05/18/24 0406  AST 27  ALT 31  ALKPHOS 44  BILITOT 0.6  PROT  6.5  ALBUMIN 3.9   No results for input(s): LIPASE, AMYLASE in the last 168 hours. Recent Labs  Lab 05/18/24 0406  AMMONIA 20   Coagulation Profile: No results for input(s): INR, PROTIME in the last 168 hours. Cardiac Enzymes: No results for input(s): CKTOTAL, CKMB, CKMBINDEX, TROPONINI in the last 168 hours. BNP (last 3 results) No results for input(s): PROBNP in the last 8760 hours. HbA1C: No results for input(s): HGBA1C in the last 72 hours. CBG: Recent Labs  Lab 05/20/24 2001 05/21/24 0737 05/21/24 1133 05/21/24 1621 05/21/24 2013  GLUCAP 114* 107* 116* 103* 120*   Lipid Profile: No results for input(s): CHOL, HDL, LDLCALC, TRIG, CHOLHDL, LDLDIRECT in the last 72 hours. Thyroid  Function Tests: No results for input(s): TSH, T4TOTAL, FREET4, T3FREE, THYROIDAB in the last 72 hours. Anemia Panel: No results for input(s): VITAMINB12, FOLATE, FERRITIN, TIBC, IRON, RETICCTPCT in the last 72 hours. Sepsis Labs: Recent Labs  Lab 05/17/24 1316  LATICACIDVEN 0.5    Recent Results (from the past 240 hours)  Urine Culture     Status: Abnormal   Collection Time: 05/17/24 12:29 PM   Specimen: Urine, Clean Catch  Result Value Ref Range Status   Specimen Description URINE, CLEAN CATCH  Final   Special Requests NONE  Final   Culture (A)  Final    <10,000 COLONIES/mL INSIGNIFICANT GROWTH Performed at [facility], [address] [place], [place] [address]    Report Status 05/18/2024 FINAL  Final  Resp panel by RT-PCR (RSV, Flu A&B, Covid) Urine, Clean Catch     Status: None   Collection Time: 05/17/24 12:30 PM   Specimen: Urine, Clean Catch; Nasal Swab  Result Value Ref Range Status   SARS Coronavirus 2 by RT PCR NEGATIVE NEGATIVE Final   Influenza A by PCR NEGATIVE NEGATIVE Final   Influenza B by PCR NEGATIVE NEGATIVE Final    Comment: (NOTE) The Xpert Xpress SARS-CoV-2/FLU/RSV plus assay is intended as an  aid in the diagnosis of influenza from Nasopharyngeal swab specimens and should not be used as a sole basis for treatment. Nasal washings and aspirates are unacceptable for Xpert Xpress SARS-CoV-2/FLU/RSV testing.  Fact Sheet for Patients: BloggerCourse.com  Fact Sheet for Healthcare Providers: SeriousBroker.it  This test is not yet approved or cleared by the [place]  FDA and has been authorized for detection and/or diagnosis of SARS-CoV-2 by FDA under an Emergency Use Authorization (EUA). This EUA will remain in effect (meaning this test can  be used) for the duration of the COVID-19 declaration under Section 564(b)(1) of the Act, 21 U.S.C. section 360bbb-3(b)(1), unless the authorization is terminated or revoked.     Resp Syncytial Virus by PCR NEGATIVE NEGATIVE Final    Comment: (NOTE) Fact Sheet for Patients: BloggerCourse.com  Fact Sheet for Healthcare Providers: SeriousBroker.it  This test is not yet approved or cleared by the [place]  FDA and has been authorized for detection and/or diagnosis of SARS-CoV-2 by FDA under an Emergency Use Authorization (EUA). This EUA will remain in effect (meaning this test can be used) for the duration of the COVID-19 declaration under Section 564(b)(1) of the Act, 21 U.S.C. section 360bbb-3(b)(1), unless the authorization is terminated or revoked.  Performed at [facility] Lab, [address], [place], [place] [address]   Culture, blood (routine x 2)     Status: None   Collection Time: 05/17/24 12:31 PM   Specimen: BLOOD  Result Value Ref Range Status   Specimen Description BLOOD RIGHT ANTECUBITAL  Final   Special Requests   Final    BOTTLES DRAWN AEROBIC AND ANAEROBIC Blood Culture results may not be optimal due to an inadequate volume of blood received in culture bottles   Culture   Final    NO GROWTH 5 DAYS Performed  at [facility] Lab, [address] [place], [place] [address]    Report Status 05/22/2024 FINAL  Final  Culture, blood (routine x 2)     Status: None   Collection Time: 05/17/24  2:20 PM   Specimen: BLOOD  Result Value Ref Range Status   Specimen Description BLOOD LEFT ANTECUBITAL  Final   Special Requests   Final    BOTTLES DRAWN AEROBIC AND ANAEROBIC Blood Culture results may not be optimal due to an inadequate volume of blood received in culture bottles   Culture   Final    NO GROWTH 5 DAYS Performed at [facility], [address] [place], [place] [address]    Report Status 05/22/2024 FINAL  Final  MRSA Next Gen by PCR, Nasal     Status: None   Collection Time: 05/19/24  6:15 AM   Specimen: Nasal Mucosa; Nasal Swab  Result Value Ref Range Status   MRSA by PCR Next Gen NOT DETECTED NOT DETECTED Final    Comment: (NOTE) The GeneXpert MRSA Assay (FDA approved for NASAL specimens only), is one component of a comprehensive MRSA colonization surveillance program. It is not intended to diagnose MRSA infection nor to guide or monitor treatment for MRSA infections. Test performance is not FDA approved in patients less than 58 years old. Performed at [facility] Lab, [address] [place], [place] [address]          Radiology Studies: No results found.         LOS: 0 days   Time spent= 35 mins    Burgess JAYSON Dare, MD [facility]  If 7PM-7AM, please contact night-coverage  05/24/2024, 12:20 PM

## 2024-05-24 NOTE — Plan of Care (Signed)
   Problem: Safety: Goal: Ability to remain free from injury will improve Outcome: Progressing

## 2024-05-25 DIAGNOSIS — S199XXA Unspecified injury of neck, initial encounter: Secondary | ICD-10-CM | POA: Diagnosis not present

## 2024-05-25 DIAGNOSIS — R2689 Other abnormalities of gait and mobility: Secondary | ICD-10-CM | POA: Diagnosis not present

## 2024-05-25 DIAGNOSIS — R2681 Unsteadiness on feet: Secondary | ICD-10-CM | POA: Diagnosis not present

## 2024-05-25 DIAGNOSIS — I5042 Chronic combined systolic (congestive) and diastolic (congestive) heart failure: Secondary | ICD-10-CM | POA: Diagnosis not present

## 2024-05-25 DIAGNOSIS — M6281 Muscle weakness (generalized): Secondary | ICD-10-CM | POA: Diagnosis not present

## 2024-05-25 DIAGNOSIS — E785 Hyperlipidemia, unspecified: Secondary | ICD-10-CM | POA: Diagnosis not present

## 2024-05-25 DIAGNOSIS — N390 Urinary tract infection, site not specified: Secondary | ICD-10-CM | POA: Diagnosis not present

## 2024-05-25 DIAGNOSIS — R42 Dizziness and giddiness: Secondary | ICD-10-CM | POA: Diagnosis not present

## 2024-05-25 DIAGNOSIS — I110 Hypertensive heart disease with heart failure: Secondary | ICD-10-CM | POA: Diagnosis not present

## 2024-05-25 DIAGNOSIS — M1990 Unspecified osteoarthritis, unspecified site: Secondary | ICD-10-CM | POA: Diagnosis not present

## 2024-05-25 DIAGNOSIS — G9340 Encephalopathy, unspecified: Secondary | ICD-10-CM | POA: Diagnosis not present

## 2024-05-25 DIAGNOSIS — G9341 Metabolic encephalopathy: Secondary | ICD-10-CM | POA: Diagnosis not present

## 2024-05-25 DIAGNOSIS — Z743 Need for continuous supervision: Secondary | ICD-10-CM | POA: Diagnosis not present

## 2024-05-25 DIAGNOSIS — S0003XA Contusion of scalp, initial encounter: Secondary | ICD-10-CM | POA: Diagnosis not present

## 2024-05-25 DIAGNOSIS — I350 Nonrheumatic aortic (valve) stenosis: Secondary | ICD-10-CM | POA: Diagnosis not present

## 2024-05-25 DIAGNOSIS — R404 Transient alteration of awareness: Secondary | ICD-10-CM | POA: Diagnosis not present

## 2024-05-25 DIAGNOSIS — F0390 Unspecified dementia without behavioral disturbance: Secondary | ICD-10-CM | POA: Diagnosis not present

## 2024-05-25 DIAGNOSIS — R1111 Vomiting without nausea: Secondary | ICD-10-CM | POA: Diagnosis not present

## 2024-05-25 DIAGNOSIS — I10 Essential (primary) hypertension: Secondary | ICD-10-CM | POA: Diagnosis not present

## 2024-05-25 DIAGNOSIS — Z85828 Personal history of other malignant neoplasm of skin: Secondary | ICD-10-CM | POA: Diagnosis not present

## 2024-05-25 DIAGNOSIS — Z5189 Encounter for other specified aftercare: Secondary | ICD-10-CM | POA: Diagnosis not present

## 2024-05-25 DIAGNOSIS — W19XXXA Unspecified fall, initial encounter: Secondary | ICD-10-CM | POA: Diagnosis not present

## 2024-05-25 DIAGNOSIS — E7800 Pure hypercholesterolemia, unspecified: Secondary | ICD-10-CM | POA: Diagnosis not present

## 2024-05-25 DIAGNOSIS — Y9301 Activity, walking, marching and hiking: Secondary | ICD-10-CM | POA: Diagnosis not present

## 2024-05-25 DIAGNOSIS — S0990XA Unspecified injury of head, initial encounter: Secondary | ICD-10-CM | POA: Diagnosis present

## 2024-05-25 DIAGNOSIS — S0093XA Contusion of unspecified part of head, initial encounter: Secondary | ICD-10-CM | POA: Diagnosis not present

## 2024-05-25 DIAGNOSIS — W1839XA Other fall on same level, initial encounter: Secondary | ICD-10-CM | POA: Diagnosis not present

## 2024-05-25 NOTE — Plan of Care (Signed)
   Problem: Safety: Goal: Ability to remain free from injury will improve Outcome: Progressing

## 2024-05-25 NOTE — TOC Transition Note (Signed)
 Transition of Care [facility]) - Discharge Note   Patient Details  Name: [name] MRN: [id number] Date of Birth: August 11, 1936  [facility] CM/SW Contact:  Lendia Dais, LCSWA Phone Number: 05/25/2024, 10:53 AM   Clinical Narrative:  Pt discharging to [facility] RN report to [phone]. CSW called [facility] at 10:50 and will transport from the [employer].  CSW called [facility] and informed them that the pt will be discharged and transported to [facility]. [facility] was agreeable and expressed gratitude.  No further TOC needs.    Final next level of care: Skilled Nursing Facility Barriers to Discharge: Barriers Resolved   Patient Goals and CMS Choice Patient states their goals for this hospitalization and ongoing recovery are:: Return to [place] (independent living)   Choice offered to / list presented to : Patient      Discharge Placement              Patient chooses bed at:  [facility]) Patient to be transferred to facility by: [facility] Name of family member notified: [name] Patient and family notified of of transfer: 05/25/24  Discharge Plan and Services Additional resources added to the After Visit Summary for   In-house Referral: Clinical Social Work                                   Social Drivers of Health (SDOH) Interventions SDOH Screenings   Food Insecurity: No Food Insecurity (05/18/2024)  Housing: Low Risk  (05/18/2024)  Transportation Needs: No Transportation Needs (05/18/2024)  Utilities: Not At Risk (05/18/2024)  Alcohol Screen: Low Risk  (08/19/2023)  Depression (PHQ2-9): Low Risk  (08/19/2023)  Financial Resource Strain: Low Risk  (08/19/2023)  Physical Activity: Inactive (08/19/2023)  Social Connections: Moderately Integrated (05/18/2024)  Stress: No Stress Concern Present (08/19/2023)  Tobacco Use: Low Risk  (05/19/2024)  Health Literacy: Adequate Health Literacy (08/19/2023)     Readmission Risk Interventions     No  data to display

## 2024-05-25 NOTE — Discharge Summary (Signed)
 Physician Discharge Summary  [name] FMW:[id number] DOB: [birth year] DOA: 05/17/2024  PCP: Amon Aloysius BRAVO, MD  Admit date: 05/17/2024 Discharge date: 05/25/2024  Admitted From: Home Disposition: SNF  Recommendations for Outpatient Follow-up:  Follow up with PCP in 1-2 weeks Please obtain BMP/CBC in one week your next doctors visit.  Started metoprolol  25 mg twice daily   Discharge Condition: Stable CODE STATUS: DNR Diet recommendation: Heart healthy  Brief/Interim Summary: Brief Narrative:   88 y.o. female with medical history significant of dementia, hypertension, hyperlipidemia, chronic systolic and diastolic heart failure presented with worsening confusion.  On presentation, patient was mildly tachycardic and tachypneic with normal WBCs, renal function and chest x-ray.  UA suggestive of UTI.  She was started on IV antibiotics.  PT recommended SNF placement.  Currently medically stable for discharge to SNF.   Assessment & Plan:   Acute metabolic encephalopathy Urinary tract infection -CT of the head is negative.  This is likely secondary urinary tract infection.  Completed 7 days of IV Rocephin .  Mentation is improved.  PT/OT recommending SNF.   History of dementia with anxiety - Continue memantine .  Continue Effexor    Hypertension -On losartan  and Lopressor  IV as needed   Hyperlipidemia - Continue simvastatin    DVT prophylaxis: enoxaparin  (LOVENOX ) injection 40 mg Start: 05/17/24 1545    Code Status: Limited: Do not attempt resuscitation (DNR) -DNR-LIMITED -Do Not Intubate/DNI  Family Communication:  [facility] placement   PT Follow up Recs: Skilled Nursing-Short Term Rehab (<3 Hours/Day)05/22/2024 1515  Subjective:  Doing okay no complaints  Examination:  General exam: Appears calm and comfortable  Respiratory system: Clear to auscultation. Respiratory effort normal. Cardiovascular system: S1 & S2 heard, RRR. No JVD, murmurs, rubs, gallops or clicks. No  pedal edema. Gastrointestinal system: Abdomen is nondistended, soft and nontender. No organomegaly or masses felt. Normal bowel sounds heard. Central nervous system: Alert and oriented. No focal neurological deficits. Extremities: Symmetric 5 x 5 power. Skin: No rashes, lesions or ulcers Psychiatry: Judgement and insight appear normal. Mood & affect appropriate.    Discharge Diagnoses:  Principal Problem:   Acute encephalopathy      Discharge Exam: Vitals:   05/25/24 0427 05/25/24 0848  BP: 134/62 132/69  Pulse: 69 75  Resp: 20 18  Temp: 98.4 F (36.9 C) 98.2 F (36.8 C)  SpO2: 100% 98%   Vitals:   05/24/24 2001 05/24/24 2200 05/25/24 0427 05/25/24 0848  BP: 123/62 123/62 134/62 132/69  Pulse: 73 73 69 75  Resp: 19  20 18   Temp: 98.6 F (37 C)  98.4 F (36.9 C) 98.2 F (36.8 C)  TempSrc: Oral  Oral Oral  SpO2: 98%  100% 98%  Weight:   82.3 kg       Discharge Instructions  Discharge Instructions     Diet - low sodium heart healthy   Complete by: As directed    Increase activity slowly   Complete by: As directed       Allergies as of 05/25/2024   No Known Allergies      Medication List     TAKE these medications    acetaminophen  325 MG tablet Commonly known as: TYLENOL  Take 650 mg by mouth in the morning and at bedtime.   alendronate  70 MG tablet Commonly known as: FOSAMAX  Take 70 mg by mouth once a week.   cholecalciferol 25 MCG (1000 UNIT) tablet Commonly known as: VITAMIN D3 Take 1,000 Units by mouth in the morning and  at bedtime.   FIBER PO Take 1 capsule by mouth daily.   losartan  25 MG tablet Commonly known as: COZAAR  Take 25 mg by mouth daily.   meclizine  12.5 MG tablet Commonly known as: ANTIVERT  Take 1 tablet (12.5 mg total) by mouth 2 (two) times daily as needed for dizziness. What changed: when to take this   memantine  5 MG tablet Commonly known as: NAMENDA  Take 1 tablet (5 mg total) by mouth 2 (two) times daily. Needs  appt   metoprolol  tartrate 25 MG tablet Commonly known as: LOPRESSOR  Take 1 tablet (25 mg total) by mouth 2 (two) times daily.   multivitamin with minerals tablet Take 1 tablet by mouth daily.   omeprazole  20 MG capsule Commonly known as: PRILOSEC Take 1 capsule (20 mg total) by mouth 2 (two) times daily before a meal. Needs appt   simvastatin  20 MG tablet Commonly known as: ZOCOR  Take 1 tablet (20 mg total) by mouth daily.   venlafaxine  XR 75 MG 24 hr capsule Commonly known as: EFFEXOR -XR Take 1 capsule (75 mg total) by mouth daily with breakfast. Needs appt   VITAMIN E PO Take 2,000 Units by mouth daily.        Contact information for after-discharge care     Destination     [facility] .   Service: Skilled Nursing Contact information: [address] [place]  [phone]                    No Known Allergies  You were cared for by a hospitalist during your hospital stay. If you have any questions about your discharge medications or the care you received while you were in the hospital after you are discharged, you can call the unit and asked to speak with the hospitalist on call if the hospitalist that took care of you is not available. Once you are discharged, your primary care physician will handle any further medical issues. Please note that no refills for any discharge medications will be authorized once you are discharged, as it is imperative that you return to your primary care physician (or establish a relationship with a primary care physician if you do not have one) for your aftercare needs so that they can reassess your need for medications and monitor your lab values.  You were cared for by a hospitalist during your hospital stay. If you have any questions about your discharge medications or the care you received while you were in the hospital after you are discharged, you can call the unit and  asked to speak with the hospitalist on call if the hospitalist that took care of you is not available. Once you are discharged, your primary care physician will handle any further medical issues. Please note that NO REFILLS for any discharge medications will be authorized once you are discharged, as it is imperative that you return to your primary care physician (or establish a relationship with a primary care physician if you do not have one) for your aftercare needs so that they can reassess your need for medications and monitor your lab values.  Please request your Prim.MD to go over all Hospital Tests and Procedure/Radiological results at the follow up, please get all Hospital records sent to your Prim MD by signing hospital release before you go home.  Get CBC, CMP, 2 view Chest X ray checked  by Primary MD during your next visit or SNF MD in 5-7  days ( we routinely change or add medications that can affect your baseline labs and fluid status, therefore we recommend that you get the mentioned basic workup next visit with your PCP, your PCP may decide not to get them or add new tests based on their clinical decision)  On your next visit with your primary care physician please Get Medicines reviewed and adjusted.  If you experience worsening of your admission symptoms, develop shortness of breath, life threatening emergency, suicidal or homicidal thoughts you must seek medical attention immediately by calling 911 or calling your MD immediately  if symptoms less severe.  You Must read complete instructions/literature along with all the possible adverse reactions/side effects for all the Medicines you take and that have been prescribed to you. Take any new Medicines after you have completely understood and accpet all the possible adverse reactions/side effects.   Do not drive, operate heavy machinery, perform activities at heights, swimming or participation in water activities or provide baby sitting  services if your were admitted for syncope or siezures until you have seen by Primary MD or a Neurologist and advised to do so again.  Do not drive when taking Pain medications.   Procedures/Studies: DG Chest Portable 1 View Result Date: 05/17/2024 CLINICAL DATA:  Altered mental status.  Concern for pneumonia. EXAM: PORTABLE CHEST 1 VIEW COMPARISON:  Chest radiograph dated 05/13/2024. FINDINGS: No focal consolidation, pleural effusion, or pneumothorax. Stable cardiac silhouette. Atherosclerotic calcification of the aorta. No acute osseous pathology. IMPRESSION: No active disease. Electronically Signed   By: Vanetta Chou M.D.   On: 05/17/2024 13:22   DG Chest Portable 1 View Result Date: 05/13/2024 EXAM: 1 VIEW XRAY OF THE CHEST 05/13/2024 12:38:51 PM COMPARISON: 12/25/2023 CLINICAL HISTORY: Altered Mental Status FINDINGS: LUNGS AND PLEURA: No focal pulmonary opacity. No pulmonary edema. No pleural effusion. No pneumothorax. HEART AND MEDIASTINUM: No acute abnormality of the cardiac and mediastinal silhouettes. Aortic atherosclerotic plaque. BONES AND SOFT TISSUES: No acute osseous abnormality. IMPRESSION: 1. No acute findings. Electronically signed by: Waddell Calk MD 05/13/2024 12:47 PM EDT RP Workstation: [id number]   CT Head Wo Contrast Result Date: 05/13/2024 CLINICAL DATA:  Delirium EXAM: CT HEAD WITHOUT CONTRAST TECHNIQUE: Contiguous axial images were obtained from the base of the skull through the vertex without intravenous contrast. RADIATION DOSE REDUCTION: This exam was performed according to the departmental dose-optimization program which includes automated exposure control, adjustment of the mA and/or kV according to patient size and/or use of iterative reconstruction technique. COMPARISON:  CT head April 12, 25. FINDINGS: Brain: No evidence of acute infarction, hemorrhage, hydrocephalus, extra-axial collection or mass lesion/mass effect. Advanced patchy white matter hypodensities are  similar to prior and compatible with chronic microvascular ischemic disease. Vascular: Calcific atherosclerosis.  No hyperdense vessel. Skull: No acute fracture.  Hyperostosis frontalis. Sinuses/Orbits: Mostly clear sinuses. Opacified left ethmoid air cell. Left concha bullosa. No acute orbital findings. Other: No mastoid effusions. IMPRESSION: 1. No evidence of acute intracranial abnormality. 2. Advanced chronic microvascular ischemic disease. Electronically Signed   By: Gilmore GORMAN Molt M.D.   On: 05/13/2024 12:44     The results of significant diagnostics from this hospitalization (including imaging, microbiology, ancillary and laboratory) are listed below for reference.     Microbiology: Recent Results (from the past 240 hours)  Urine Culture     Status: Abnormal   Collection Time: 05/17/24 12:29 PM   Specimen: Urine, Clean Catch  Result Value Ref Range Status   Specimen Description URINE, CLEAN CATCH  Final   Special Requests NONE  Final   Culture (A)  Final    <10,000 COLONIES/mL INSIGNIFICANT GROWTH Performed at [facility], [address] [place], [place] [address]    Report Status 05/18/2024 FINAL  Final  Resp panel by RT-PCR (RSV, Flu A&B, Covid) Urine, Clean Catch     Status: None   Collection Time: 05/17/24 12:30 PM   Specimen: Urine, Clean Catch; Nasal Swab  Result Value Ref Range Status   SARS Coronavirus 2 by RT PCR NEGATIVE NEGATIVE Final   Influenza A by PCR NEGATIVE NEGATIVE Final   Influenza B by PCR NEGATIVE NEGATIVE Final    Comment: (NOTE) The Xpert Xpress SARS-CoV-2/FLU/RSV plus assay is intended as an aid in the diagnosis of influenza from Nasopharyngeal swab specimens and should not be used as a sole basis for treatment. Nasal washings and aspirates are unacceptable for Xpert Xpress SARS-CoV-2/FLU/RSV testing.  Fact Sheet for Patients: BloggerCourse.com  Fact Sheet for Healthcare  Providers: SeriousBroker.it  This test is not yet approved or cleared by the [place]  FDA and has been authorized for detection and/or diagnosis of SARS-CoV-2 by FDA under an Emergency Use Authorization (EUA). This EUA will remain in effect (meaning this test can be used) for the duration of the COVID-19 declaration under Section 564(b)(1) of the Act, 21 U.S.C. section 360bbb-3(b)(1), unless the authorization is terminated or revoked.     Resp Syncytial Virus by PCR NEGATIVE NEGATIVE Final    Comment: (NOTE) Fact Sheet for Patients: BloggerCourse.com  Fact Sheet for Healthcare Providers: SeriousBroker.it  This test is not yet approved or cleared by the [place]  FDA and has been authorized for detection and/or diagnosis of SARS-CoV-2 by FDA under an Emergency Use Authorization (EUA). This EUA will remain in effect (meaning this test can be used) for the duration of the COVID-19 declaration under Section 564(b)(1) of the Act, 21 U.S.C. section 360bbb-3(b)(1), unless the authorization is terminated or revoked.  Performed at [facility], [address], [place], [place] [address]   Culture, blood (routine x 2)     Status: None   Collection Time: 05/17/24 12:31 PM   Specimen: BLOOD  Result Value Ref Range Status   Specimen Description BLOOD RIGHT ANTECUBITAL  Final   Special Requests   Final    BOTTLES DRAWN AEROBIC AND ANAEROBIC Blood Culture results may not be optimal due to an inadequate volume of blood received in culture bottles   Culture   Final    NO GROWTH 5 DAYS Performed at [facility], [address], [place], [place] [address]    Report Status 05/22/2024 FINAL  Final  Culture, blood (routine x 2)     Status: None   Collection Time: 05/17/24  2:20 PM   Specimen: BLOOD  Result Value Ref Range Status   Specimen Description BLOOD LEFT ANTECUBITAL  Final   Special  Requests   Final    BOTTLES DRAWN AEROBIC AND ANAEROBIC Blood Culture results may not be optimal due to an inadequate volume of blood received in culture bottles   Culture   Final    NO GROWTH 5 DAYS Performed at [facility], [address], [place], [place] [address]    Report Status 05/22/2024 FINAL  Final  MRSA Next Gen by PCR, Nasal     Status: None   Collection Time: 05/19/24  6:15 AM   Specimen: Nasal Mucosa; Nasal Swab  Result Value Ref Range Status  MRSA by PCR Next Gen NOT DETECTED NOT DETECTED Final    Comment: (NOTE) The GeneXpert MRSA Assay (FDA approved for NASAL specimens only), is one component of a comprehensive MRSA colonization surveillance program. It is not intended to diagnose MRSA infection nor to guide or monitor treatment for MRSA infections. Test performance is not FDA approved in patients less than 69 years old. Performed at [facility], [address], [place], [place] [address]      Labs: BNP (last 3 results) Recent Labs    05/17/24 1230  BNP 54.3   Basic Metabolic Panel: No results for input(s): NA, K, CL, CO2, GLUCOSE, BUN, CREATININE, CALCIUM, MG, PHOS in the last 168 hours. Liver Function Tests: No results for input(s): AST, ALT, ALKPHOS, BILITOT, PROT, ALBUMIN in the last 168 hours. No results for input(s): LIPASE, AMYLASE in the last 168 hours. No results for input(s): AMMONIA in the last 168 hours. CBC: No results for input(s): WBC, NEUTROABS, HGB, HCT, MCV, PLT in the last 168 hours. Cardiac Enzymes: No results for input(s): CKTOTAL, CKMB, CKMBINDEX, TROPONINI in the last 168 hours. BNP: Invalid input(s): POCBNP CBG: Recent Labs  Lab 05/20/24 2001 05/21/24 0737 05/21/24 1133 05/21/24 1621 05/21/24 2013  GLUCAP 114* 107* 116* 103* 120*   D-Dimer No results for input(s): DDIMER in the last 72 hours. Hgb A1c No results for input(s): HGBA1C in the last  72 hours. Lipid Profile No results for input(s): CHOL, HDL, LDLCALC, TRIG, CHOLHDL, LDLDIRECT in the last 72 hours. Thyroid  function studies No results for input(s): TSH, T4TOTAL, T3FREE, THYROIDAB in the last 72 hours.  Invalid input(s): FREET3 Anemia work up No results for input(s): VITAMINB12, FOLATE, FERRITIN, TIBC, IRON, RETICCTPCT in the last 72 hours. Urinalysis    Component Value Date/Time   COLORURINE YELLOW 05/17/2024 1229   APPEARANCEUR CLEAR 05/17/2024 1229   LABSPEC 1.014 05/17/2024 1229   PHURINE 5.0 05/17/2024 1229   GLUCOSEU NEGATIVE 05/17/2024 1229   GLUCOSEU NEGATIVE 07/14/2022 1600   HGBUR NEGATIVE 05/17/2024 1229   BILIRUBINUR NEGATIVE 05/17/2024 1229   BILIRUBINUR Negative 02/05/2023 1402   KETONESUR NEGATIVE 05/17/2024 1229   PROTEINUR NEGATIVE 05/17/2024 1229   UROBILINOGEN 0.2 02/05/2023 1402   UROBILINOGEN 0.2 07/14/2022 1600   NITRITE NEGATIVE 05/17/2024 1229   LEUKOCYTESUR SMALL (A) 05/17/2024 1229   Sepsis Labs No results for input(s): WBC in the last 168 hours.  Invalid input(s): PROCALCITONIN, LACTICIDVEN Microbiology Recent Results (from the past 240 hours)  Urine Culture     Status: Abnormal   Collection Time: 05/17/24 12:29 PM   Specimen: Urine, Clean Catch  Result Value Ref Range Status   Specimen Description URINE, CLEAN CATCH  Final   Special Requests NONE  Final   Culture (A)  Final    <10,000 COLONIES/mL INSIGNIFICANT GROWTH Performed at [facility], [address], [place], [place] [address]    Report Status 05/18/2024 FINAL  Final  Resp panel by RT-PCR (RSV, Flu A&B, Covid) Urine, Clean Catch     Status: None   Collection Time: 05/17/24 12:30 PM   Specimen: Urine, Clean Catch; Nasal Swab  Result Value Ref Range Status   SARS Coronavirus 2 by RT PCR NEGATIVE NEGATIVE Final   Influenza A by PCR NEGATIVE NEGATIVE Final   Influenza B by PCR NEGATIVE NEGATIVE Final    Comment:  (NOTE) The Xpert Xpress SARS-CoV-2/FLU/RSV plus assay is intended as an aid in the diagnosis of influenza from Nasopharyngeal swab specimens and should not  be used as a sole basis for treatment. Nasal washings and aspirates are unacceptable for Xpert Xpress SARS-CoV-2/FLU/RSV testing.  Fact Sheet for Patients: BloggerCourse.com  Fact Sheet for Healthcare Providers: SeriousBroker.it  This test is not yet approved or cleared by the [place]  FDA and has been authorized for detection and/or diagnosis of SARS-CoV-2 by FDA under an Emergency Use Authorization (EUA). This EUA will remain in effect (meaning this test can be used) for the duration of the COVID-19 declaration under Section 564(b)(1) of the Act, 21 U.S.C. section 360bbb-3(b)(1), unless the authorization is terminated or revoked.     Resp Syncytial Virus by PCR NEGATIVE NEGATIVE Final    Comment: (NOTE) Fact Sheet for Patients: BloggerCourse.com  Fact Sheet for Healthcare Providers: SeriousBroker.it  This test is not yet approved or cleared by the [place]  FDA and has been authorized for detection and/or diagnosis of SARS-CoV-2 by FDA under an Emergency Use Authorization (EUA). This EUA will remain in effect (meaning this test can be used) for the duration of the COVID-19 declaration under Section 564(b)(1) of the Act, 21 U.S.C. section 360bbb-3(b)(1), unless the authorization is terminated or revoked.  Performed at [facility], [address] [place], [place] [address]   Culture, blood (routine x 2)     Status: None   Collection Time: 05/17/24 12:31 PM   Specimen: BLOOD  Result Value Ref Range Status   Specimen Description BLOOD RIGHT ANTECUBITAL  Final   Special Requests   Final    BOTTLES DRAWN AEROBIC AND ANAEROBIC Blood Culture results may not be optimal due to an inadequate volume of blood  received in culture bottles   Culture   Final    NO GROWTH 5 DAYS Performed at [facility], [address] [place], [place] [address]    Report Status 05/22/2024 FINAL  Final  Culture, blood (routine x 2)     Status: None   Collection Time: 05/17/24  2:20 PM   Specimen: BLOOD  Result Value Ref Range Status   Specimen Description BLOOD LEFT ANTECUBITAL  Final   Special Requests   Final    BOTTLES DRAWN AEROBIC AND ANAEROBIC Blood Culture results may not be optimal due to an inadequate volume of blood received in culture bottles   Culture   Final    NO GROWTH 5 DAYS Performed at [facility], [address], [place], [place] [address]    Report Status 05/22/2024 FINAL  Final  MRSA Next Gen by PCR, Nasal     Status: None   Collection Time: 05/19/24  6:15 AM   Specimen: Nasal Mucosa; Nasal Swab  Result Value Ref Range Status   MRSA by PCR Next Gen NOT DETECTED NOT DETECTED Final    Comment: (NOTE) The GeneXpert MRSA Assay (FDA approved for NASAL specimens only), is one component of a comprehensive MRSA colonization surveillance program. It is not intended to diagnose MRSA infection nor to guide or monitor treatment for MRSA infections. Test performance is not FDA approved in patients less than 2 years old. Performed at [facility], [address], [place], [place] [address]      Time coordinating discharge:  I have spent 35 minutes face to face with the patient and on the ward discussing the patients care, assessment, plan and disposition with other care givers. >50% of the time was devoted counseling the patient about the risks and benefits of treatment/Discharge disposition and coordinating care.   SIGNED:  Burgess JAYSON Dare, MD  [facility] 05/25/2024, 9:52 AM   If 7PM-7AM, please contact night-coverage

## 2024-05-26 DIAGNOSIS — M6281 Muscle weakness (generalized): Secondary | ICD-10-CM | POA: Diagnosis not present

## 2024-05-26 DIAGNOSIS — E7800 Pure hypercholesterolemia, unspecified: Secondary | ICD-10-CM | POA: Diagnosis not present

## 2024-05-26 DIAGNOSIS — G9341 Metabolic encephalopathy: Secondary | ICD-10-CM | POA: Diagnosis not present

## 2024-05-26 DIAGNOSIS — R2689 Other abnormalities of gait and mobility: Secondary | ICD-10-CM | POA: Diagnosis not present

## 2024-05-26 DIAGNOSIS — I10 Essential (primary) hypertension: Secondary | ICD-10-CM | POA: Diagnosis not present

## 2024-05-26 DIAGNOSIS — Z5189 Encounter for other specified aftercare: Secondary | ICD-10-CM | POA: Diagnosis not present

## 2024-05-26 DIAGNOSIS — N390 Urinary tract infection, site not specified: Secondary | ICD-10-CM | POA: Diagnosis not present

## 2024-05-26 DIAGNOSIS — R42 Dizziness and giddiness: Secondary | ICD-10-CM | POA: Diagnosis not present

## 2024-05-26 DIAGNOSIS — G9340 Encephalopathy, unspecified: Secondary | ICD-10-CM | POA: Diagnosis not present

## 2024-05-29 ENCOUNTER — Encounter (HOSPITAL_COMMUNITY): Payer: Self-pay | Admitting: Emergency Medicine

## 2024-05-29 ENCOUNTER — Emergency Department (HOSPITAL_COMMUNITY)

## 2024-05-29 ENCOUNTER — Emergency Department (HOSPITAL_COMMUNITY)
Admission: EM | Admit: 2024-05-29 | Discharge: 2024-05-30 | Disposition: A | Discharge status: Home / Self Care | Attending: Emergency Medicine | Admitting: Emergency Medicine

## 2024-05-29 DIAGNOSIS — W1839XA Other fall on same level, initial encounter: Secondary | ICD-10-CM | POA: Insufficient documentation

## 2024-05-29 DIAGNOSIS — F0390 Unspecified dementia without behavioral disturbance: Secondary | ICD-10-CM | POA: Diagnosis not present

## 2024-05-29 DIAGNOSIS — S0003XA Contusion of scalp, initial encounter: Secondary | ICD-10-CM | POA: Diagnosis not present

## 2024-05-29 DIAGNOSIS — I10 Essential (primary) hypertension: Secondary | ICD-10-CM | POA: Diagnosis not present

## 2024-05-29 DIAGNOSIS — S0990XA Unspecified injury of head, initial encounter: Secondary | ICD-10-CM | POA: Diagnosis not present

## 2024-05-29 DIAGNOSIS — N390 Urinary tract infection, site not specified: Secondary | ICD-10-CM | POA: Diagnosis not present

## 2024-05-29 DIAGNOSIS — Z5189 Encounter for other specified aftercare: Secondary | ICD-10-CM | POA: Diagnosis not present

## 2024-05-29 DIAGNOSIS — S0093XA Contusion of unspecified part of head, initial encounter: Secondary | ICD-10-CM | POA: Insufficient documentation

## 2024-05-29 DIAGNOSIS — S199XXA Unspecified injury of neck, initial encounter: Secondary | ICD-10-CM | POA: Diagnosis not present

## 2024-05-29 DIAGNOSIS — R1111 Vomiting without nausea: Secondary | ICD-10-CM | POA: Diagnosis not present

## 2024-05-29 DIAGNOSIS — Y9301 Activity, walking, marching and hiking: Secondary | ICD-10-CM | POA: Diagnosis not present

## 2024-05-29 DIAGNOSIS — W19XXXA Unspecified fall, initial encounter: Secondary | ICD-10-CM

## 2024-05-29 DIAGNOSIS — G9341 Metabolic encephalopathy: Secondary | ICD-10-CM | POA: Diagnosis not present

## 2024-05-29 DIAGNOSIS — E7800 Pure hypercholesterolemia, unspecified: Secondary | ICD-10-CM | POA: Diagnosis not present

## 2024-05-29 LAB — COMPREHENSIVE METABOLIC PANEL WITH GFR
ALT: 32 U/L (ref 0–44)
AST: 22 U/L (ref 15–41)
Albumin: 4.3 g/dL (ref 3.5–5.0)
Alkaline Phosphatase: 64 U/L (ref 38–126)
Anion gap: 12 (ref 5–15)
BUN: 29 mg/dL — ABNORMAL HIGH (ref 8–23)
CO2: 23 mmol/L (ref 22–32)
Calcium: 9.9 mg/dL (ref 8.9–10.3)
Chloride: 105 mmol/L (ref 98–111)
Creatinine, Ser: 0.94 mg/dL (ref 0.44–1.00)
GFR, Estimated: 59 mL/min — ABNORMAL LOW (ref >60)
Glucose, Bld: 114 mg/dL — ABNORMAL HIGH (ref 70–99)
Potassium: 4.2 mmol/L (ref 3.5–5.1)
Sodium: 140 mmol/L (ref 135–145)
Total Bilirubin: 0.3 mg/dL (ref 0.0–1.2)
Total Protein: 6.8 g/dL (ref 6.5–8.1)

## 2024-05-29 LAB — CBC WITH DIFFERENTIAL/PLATELET
Abs Immature Granulocytes: 0.03 K/uL (ref 0.00–0.07)
Basophils Absolute: 0.1 K/uL (ref 0.0–0.1)
Basophils Relative: 1 %
Eosinophils Absolute: 0.2 K/uL (ref 0.0–0.5)
Eosinophils Relative: 4 %
HCT: 36.6 % (ref 36.0–46.0)
Hemoglobin: 11.4 g/dL — ABNORMAL LOW (ref 12.0–15.0)
Immature Granulocytes: 1 %
Lymphocytes Relative: 24 %
Lymphs Abs: 1.2 K/uL (ref 0.7–4.0)
MCH: 29.5 pg (ref 26.0–34.0)
MCHC: 31.1 g/dL (ref 30.0–36.0)
MCV: 94.8 fL (ref 80.0–100.0)
Monocytes Absolute: 0.6 K/uL (ref 0.1–1.0)
Monocytes Relative: 13 %
Neutro Abs: 3 K/uL (ref 1.7–7.7)
Neutrophils Relative %: 57 %
Platelets: 225 K/uL (ref 150–400)
RBC: 3.86 MIL/uL — ABNORMAL LOW (ref 3.87–5.11)
RDW: 11.9 % (ref 11.5–15.5)
WBC: 5.1 K/uL (ref 4.0–10.5)
nRBC: 0 % (ref 0.0–0.2)

## 2024-05-29 NOTE — Discharge Instructions (Signed)
 Take over-the-counter medications to help with your headache.  Apply ice for the next 48 hours intermittently to help with the swelling.  Return to the ER for vomiting severe headache or other concerning symptoms

## 2024-05-29 NOTE — ED Notes (Signed)
 Pt assisted to restroom.

## 2024-05-29 NOTE — ED Provider Notes (Signed)
 [place] EMERGENCY DEPARTMENT AT [facility] Provider Note   CSN: [id number] Arrival date & time: 05/29/24  2033     Patient presents with: Fall and Head Injury   [name] is a 88 y.o. female.  {Add pertinent medical, surgical, social history, OB history to YEP:67052}  Fall  Head Injury    Pt states she was walking and ended up falling.  SHe is not sure why she exactly.  No loss of consciousness.  No chest pain or shortness of breath. No numbness or weakness.  She does have a headache.  Prior to Admission medications   Medication Sig Start Date End Date Taking? Authorizing Provider  acetaminophen  (TYLENOL ) 325 MG tablet Take 650 mg by mouth in the morning and at bedtime. 08/28/19   [provider]  alendronate  (FOSAMAX ) 70 MG tablet Take 70 mg by mouth once a week. Patient not taking: Reported on 05/18/2024 11/24/23   [provider]  cholecalciferol (VITAMIN D3) 25 MCG (1000 UNIT) tablet Take 1,000 Units by mouth in the morning and at bedtime.    [provider]  FIBER PO Take 1 capsule by mouth daily.    [provider]  losartan  (COZAAR ) 25 MG tablet Take 25 mg by mouth daily. 01/15/24   [provider]  meclizine  (ANTIVERT ) 12.5 MG tablet Take 1 tablet (12.5 mg total) by mouth 2 (two) times daily as needed for dizziness. Patient taking differently: Take 12.5 mg by mouth 2 (two) times daily. 12/28/23   Caleen Qualia, MD  memantine  (NAMENDA ) 5 MG tablet Take 1 tablet (5 mg total) by mouth 2 (two) times daily. Needs appt 05/08/24   Amon Aloysius BRAVO, MD  metoprolol  tartrate (LOPRESSOR ) 25 MG tablet Take 1 tablet (25 mg total) by mouth 2 (two) times daily. 05/22/24   Cheryle Page, MD  Multiple Vitamins-Minerals (MULTIVITAMIN WITH MINERALS) tablet Take 1 tablet by mouth daily.    [provider]  omeprazole  (PRILOSEC) 20 MG capsule Take 1 capsule (20 mg total) by mouth 2 (two) times daily before a meal. Needs appt 05/08/24    Amon Aloysius BRAVO, MD  simvastatin  (ZOCOR ) 20 MG tablet Take 1 tablet (20 mg total) by mouth daily. 04/10/24   Amon Aloysius BRAVO, MD  venlafaxine  XR (EFFEXOR -XR) 75 MG 24 hr capsule Take 1 capsule (75 mg total) by mouth daily with breakfast. Needs appt 05/08/24   Paz, Jose E, MD  VITAMIN E PO Take 2,000 Units by mouth daily.    [provider]    Allergies: Patient has no known allergies.    Review of Systems  Updated Vital Signs BP (!) 153/84   Pulse 84   Temp 98.3 F (36.8 C)   Resp 16   SpO2 97%   Physical Exam Vitals and nursing note reviewed.  Constitutional:      General: She is not in acute distress.    Appearance: She is well-developed.  HENT:     Head: Normocephalic.     Comments: Hematoma occiput    Right Ear: External ear normal.     Left Ear: External ear normal.  Eyes:     General: No visual field deficit or scleral icterus.       Right eye: No discharge.        Left eye: No discharge.     Conjunctiva/sclera: Conjunctivae normal.  Neck:     Trachea: No tracheal deviation.  Cardiovascular:     Rate and Rhythm: Normal rate  and regular rhythm.  Pulmonary:     Effort: Pulmonary effort is normal. No respiratory distress.     Breath sounds: Normal breath sounds. No stridor. No wheezing or rales.  Abdominal:     General: Bowel sounds are normal. There is no distension.     Palpations: Abdomen is soft.     Tenderness: There is no abdominal tenderness. There is no guarding or rebound.  Musculoskeletal:        General: No tenderness or deformity.     Cervical back: Neck supple.  Skin:    General: Skin is warm and dry.     Findings: No rash.  Neurological:     General: No focal deficit present.     Mental Status: She is alert.     Cranial Nerves: No cranial nerve deficit, dysarthria or facial asymmetry.     Sensory: No sensory deficit.     Motor: No abnormal muscle tone, seizure activity or pronator drift.     Coordination: Coordination normal.     Comments:   able to hold both legs off bed for 5 seconds, sensation intact in all extremities,  no left or right sided neglect, no nystagmus noted   Psychiatric:        Mood and Affect: Mood normal.     (all labs ordered are listed, but only abnormal results are displayed) Labs Reviewed  CBC WITH DIFFERENTIAL/PLATELET - Abnormal; Notable for the following components:      Result Value   RBC 3.86 (*)    Hemoglobin 11.4 (*)    All other components within normal limits  COMPREHENSIVE METABOLIC PANEL WITH GFR - Abnormal; Notable for the following components:   Glucose, Bld 114 (*)    BUN 29 (*)    GFR, Estimated 59 (*)    All other components within normal limits    EKG: None  Radiology: CT Head Wo Contrast Result Date: 05/29/2024 CLINICAL DATA:  Head trauma, minor (Age >= 65y); Neck trauma (Age >= 65y) fall with injury to her head. No thinners. Pt has hx dementia. No LOC, no neck/back pain. EXAM: CT HEAD WITHOUT CONTRAST CT CERVICAL SPINE WITHOUT CONTRAST TECHNIQUE: Multidetector CT imaging of the head and cervical spine was performed following the standard protocol without intravenous contrast. Multiplanar CT image reconstructions of the cervical spine were also generated. RADIATION DOSE REDUCTION: This exam was performed according to the departmental dose-optimization program which includes automated exposure control, adjustment of the mA and/or kV according to patient size and/or use of iterative reconstruction technique. COMPARISON:  CT head and C-spine 11/21/2023 FINDINGS: CT HEAD FINDINGS Brain: Patchy and confluent areas of decreased attenuation are noted throughout the deep and periventricular white matter of the cerebral hemispheres bilaterally, compatible with chronic microvascular ischemic disease. No evidence of large-territorial acute infarction. No parenchymal hemorrhage. No mass lesion. No extra-axial collection. No mass effect or midline shift. No hydrocephalus. Basilar cisterns are  patent. Vascular: No hyperdense vessel. Skull: No acute fracture or focal lesion. Sinuses/Orbits: Bilateral sphenoid sinus mucosal thickening. Paranasal sinuses and mastoid air cells are clear. Bilateral lens replacement. Otherwise the orbits are unremarkable. Other: A 1.6 cm scalp vertex hematoma. CT CERVICAL SPINE FINDINGS Alignment: Normal. Skull base and vertebrae: Multilevel moderate degenerative change of the spine. No acute fracture. No aggressive appearing focal osseous lesion or focal pathologic process. Soft tissues and spinal canal: No prevertebral fluid or swelling. No visible canal hematoma. Upper chest: Unremarkable. Other: None. IMPRESSION: 1. No acute intracranial abnormality. 2. No  acute displaced fracture or traumatic listhesis of the cervical spine. 3. A 1.6 cm scalp vertex hematoma. Electronically Signed   By: Morgane  Naveau M.D.   On: 05/29/2024 23:26   CT Cervical Spine Wo Contrast Result Date: 05/29/2024 CLINICAL DATA:  Head trauma, minor (Age >= 65y); Neck trauma (Age >= 65y) fall with injury to her head. No thinners. Pt has hx dementia. No LOC, no neck/back pain. EXAM: CT HEAD WITHOUT CONTRAST CT CERVICAL SPINE WITHOUT CONTRAST TECHNIQUE: Multidetector CT imaging of the head and cervical spine was performed following the standard protocol without intravenous contrast. Multiplanar CT image reconstructions of the cervical spine were also generated. RADIATION DOSE REDUCTION: This exam was performed according to the departmental dose-optimization program which includes automated exposure control, adjustment of the mA and/or kV according to patient size and/or use of iterative reconstruction technique. COMPARISON:  CT head and C-spine 11/21/2023 FINDINGS: CT HEAD FINDINGS Brain: Patchy and confluent areas of decreased attenuation are noted throughout the deep and periventricular white matter of the cerebral hemispheres bilaterally, compatible with chronic microvascular ischemic disease. No  evidence of large-territorial acute infarction. No parenchymal hemorrhage. No mass lesion. No extra-axial collection. No mass effect or midline shift. No hydrocephalus. Basilar cisterns are patent. Vascular: No hyperdense vessel. Skull: No acute fracture or focal lesion. Sinuses/Orbits: Bilateral sphenoid sinus mucosal thickening. Paranasal sinuses and mastoid air cells are clear. Bilateral lens replacement. Otherwise the orbits are unremarkable. Other: A 1.6 cm scalp vertex hematoma. CT CERVICAL SPINE FINDINGS Alignment: Normal. Skull base and vertebrae: Multilevel moderate degenerative change of the spine. No acute fracture. No aggressive appearing focal osseous lesion or focal pathologic process. Soft tissues and spinal canal: No prevertebral fluid or swelling. No visible canal hematoma. Upper chest: Unremarkable. Other: None. IMPRESSION: 1. No acute intracranial abnormality. 2. No acute displaced fracture or traumatic listhesis of the cervical spine. 3. A 1.6 cm scalp vertex hematoma. Electronically Signed   By: Morgane  Naveau M.D.   On: 05/29/2024 23:26    {Document cardiac monitor, telemetry assessment procedure when appropriate:32947} Procedures   Medications Ordered in the ED - No data to display  Clinical Course as of 05/29/24 2341  Mon May 29, 2024  2332 CBC and metabolic panel normal. [JK]  2332 Head CT shows a scalp hematoma but no signs of any fracture or intracranial bleeding [JK]    Clinical Course User Index [JK] Randol Simmonds, MD   {Click here for ABCD2, HEART and other calculators REFRESH Note before signing:1}                              Medical Decision Making Problems Addressed: Fall, initial encounter: acute illness or injury that poses a threat to life or bodily functions Injury of head, initial encounter: acute illness or injury that poses a threat to life or bodily functions  Amount and/or Complexity of Data Reviewed Labs: ordered. Radiology:  ordered.   ***  {Document critical care time when appropriate  Document review of labs and clinical decision tools ie [id number], etc  Document your independent review of radiology images and any outside records  Document your discussion with family members, caretakers and with consultants  Document social determinants of health affecting pt's care  Document your decision making why or why not admission, treatments were needed:32947:::1}   Final diagnoses:  Fall, initial encounter  Injury of head, initial encounter    ED Discharge Orders     None

## 2024-05-29 NOTE — ED Triage Notes (Signed)
 Pt arriving via [facility] from [facility] for fall with injury to her head. No thinners. Pt has hx dementia. No LOC, no neck/back pain.

## 2024-05-30 DIAGNOSIS — G9340 Encephalopathy, unspecified: Secondary | ICD-10-CM | POA: Diagnosis not present

## 2024-05-30 DIAGNOSIS — M6281 Muscle weakness (generalized): Secondary | ICD-10-CM | POA: Diagnosis not present

## 2024-05-30 DIAGNOSIS — R42 Dizziness and giddiness: Secondary | ICD-10-CM | POA: Diagnosis not present

## 2024-05-30 DIAGNOSIS — R2689 Other abnormalities of gait and mobility: Secondary | ICD-10-CM | POA: Diagnosis not present

## 2024-05-30 DIAGNOSIS — W19XXXA Unspecified fall, initial encounter: Secondary | ICD-10-CM | POA: Diagnosis not present

## 2024-05-30 DIAGNOSIS — M1990 Unspecified osteoarthritis, unspecified site: Secondary | ICD-10-CM | POA: Diagnosis not present

## 2024-05-30 DIAGNOSIS — E785 Hyperlipidemia, unspecified: Secondary | ICD-10-CM | POA: Diagnosis not present

## 2024-05-30 DIAGNOSIS — I5042 Chronic combined systolic (congestive) and diastolic (congestive) heart failure: Secondary | ICD-10-CM | POA: Diagnosis not present

## 2024-05-30 DIAGNOSIS — I110 Hypertensive heart disease with heart failure: Secondary | ICD-10-CM | POA: Diagnosis not present

## 2024-05-30 DIAGNOSIS — I350 Nonrheumatic aortic (valve) stenosis: Secondary | ICD-10-CM | POA: Diagnosis not present

## 2024-05-30 NOTE — ED Notes (Signed)
 Given report to Katie at [facility] about patient's discharge and family members dropping patient off

## 2024-05-30 NOTE — ED Notes (Signed)
 Patient's family members dropping patient at [facility]

## 2024-05-31 DIAGNOSIS — Z5189 Encounter for other specified aftercare: Secondary | ICD-10-CM | POA: Diagnosis not present

## 2024-05-31 DIAGNOSIS — G9341 Metabolic encephalopathy: Secondary | ICD-10-CM | POA: Diagnosis not present

## 2024-05-31 DIAGNOSIS — N390 Urinary tract infection, site not specified: Secondary | ICD-10-CM | POA: Diagnosis not present

## 2024-05-31 DIAGNOSIS — E7800 Pure hypercholesterolemia, unspecified: Secondary | ICD-10-CM | POA: Diagnosis not present

## 2024-05-31 DIAGNOSIS — I10 Essential (primary) hypertension: Secondary | ICD-10-CM | POA: Diagnosis not present

## 2024-06-01 NOTE — Progress Notes (Signed)
 My response to the following coding query: Please clarify the medical condition(s) necessitating the order for respiratory panel  Evaluate for viral illness

## 2024-06-02 DIAGNOSIS — R2689 Other abnormalities of gait and mobility: Secondary | ICD-10-CM | POA: Diagnosis not present

## 2024-06-02 DIAGNOSIS — M6281 Muscle weakness (generalized): Secondary | ICD-10-CM | POA: Diagnosis not present

## 2024-06-02 DIAGNOSIS — G9340 Encephalopathy, unspecified: Secondary | ICD-10-CM | POA: Diagnosis not present

## 2024-06-02 DIAGNOSIS — R42 Dizziness and giddiness: Secondary | ICD-10-CM | POA: Diagnosis not present

## 2024-06-06 DIAGNOSIS — R2689 Other abnormalities of gait and mobility: Secondary | ICD-10-CM | POA: Diagnosis not present

## 2024-06-06 DIAGNOSIS — R42 Dizziness and giddiness: Secondary | ICD-10-CM | POA: Diagnosis not present

## 2024-06-06 DIAGNOSIS — M6281 Muscle weakness (generalized): Secondary | ICD-10-CM | POA: Diagnosis not present

## 2024-06-06 DIAGNOSIS — G9340 Encephalopathy, unspecified: Secondary | ICD-10-CM | POA: Diagnosis not present

## 2024-06-07 DIAGNOSIS — E7800 Pure hypercholesterolemia, unspecified: Secondary | ICD-10-CM | POA: Diagnosis not present

## 2024-06-07 DIAGNOSIS — G9341 Metabolic encephalopathy: Secondary | ICD-10-CM | POA: Diagnosis not present

## 2024-06-09 DIAGNOSIS — G9340 Encephalopathy, unspecified: Secondary | ICD-10-CM | POA: Diagnosis not present

## 2024-06-09 DIAGNOSIS — M6281 Muscle weakness (generalized): Secondary | ICD-10-CM | POA: Diagnosis not present

## 2024-06-09 DIAGNOSIS — G9341 Metabolic encephalopathy: Secondary | ICD-10-CM | POA: Diagnosis not present

## 2024-06-09 DIAGNOSIS — E7800 Pure hypercholesterolemia, unspecified: Secondary | ICD-10-CM | POA: Diagnosis not present

## 2024-06-09 DIAGNOSIS — R42 Dizziness and giddiness: Secondary | ICD-10-CM | POA: Diagnosis not present

## 2024-06-09 DIAGNOSIS — R2689 Other abnormalities of gait and mobility: Secondary | ICD-10-CM | POA: Diagnosis not present

## 2024-06-14 ENCOUNTER — Telehealth: Payer: Self-pay

## 2024-06-14 DIAGNOSIS — K219 Gastro-esophageal reflux disease without esophagitis: Secondary | ICD-10-CM | POA: Diagnosis not present

## 2024-06-14 DIAGNOSIS — I509 Heart failure, unspecified: Secondary | ICD-10-CM | POA: Diagnosis not present

## 2024-06-14 DIAGNOSIS — E559 Vitamin D deficiency, unspecified: Secondary | ICD-10-CM | POA: Diagnosis not present

## 2024-06-14 DIAGNOSIS — I5042 Chronic combined systolic (congestive) and diastolic (congestive) heart failure: Secondary | ICD-10-CM | POA: Diagnosis not present

## 2024-06-14 DIAGNOSIS — M6281 Muscle weakness (generalized): Secondary | ICD-10-CM | POA: Diagnosis not present

## 2024-06-14 DIAGNOSIS — Z5189 Encounter for other specified aftercare: Secondary | ICD-10-CM | POA: Diagnosis not present

## 2024-06-14 DIAGNOSIS — E7800 Pure hypercholesterolemia, unspecified: Secondary | ICD-10-CM | POA: Diagnosis not present

## 2024-06-14 DIAGNOSIS — E785 Hyperlipidemia, unspecified: Secondary | ICD-10-CM | POA: Diagnosis not present

## 2024-06-14 DIAGNOSIS — M810 Age-related osteoporosis without current pathological fracture: Secondary | ICD-10-CM | POA: Diagnosis not present

## 2024-06-14 DIAGNOSIS — I10 Essential (primary) hypertension: Secondary | ICD-10-CM | POA: Diagnosis not present

## 2024-06-20 DIAGNOSIS — E559 Vitamin D deficiency, unspecified: Secondary | ICD-10-CM | POA: Diagnosis not present

## 2024-06-20 DIAGNOSIS — R7309 Other abnormal glucose: Secondary | ICD-10-CM | POA: Diagnosis not present

## 2024-06-20 DIAGNOSIS — E782 Mixed hyperlipidemia: Secondary | ICD-10-CM | POA: Diagnosis not present

## 2024-06-20 DIAGNOSIS — E038 Other specified hypothyroidism: Secondary | ICD-10-CM | POA: Diagnosis not present

## 2024-06-20 DIAGNOSIS — Z79899 Other long term (current) drug therapy: Secondary | ICD-10-CM | POA: Diagnosis not present

## 2024-06-20 DIAGNOSIS — D519 Vitamin B12 deficiency anemia, unspecified: Secondary | ICD-10-CM | POA: Diagnosis not present

## 2024-06-20 DIAGNOSIS — Z0189 Encounter for other specified special examinations: Secondary | ICD-10-CM | POA: Diagnosis not present

## 2024-06-21 DIAGNOSIS — I739 Peripheral vascular disease, unspecified: Secondary | ICD-10-CM | POA: Diagnosis not present

## 2024-06-21 DIAGNOSIS — M2040 Other hammer toe(s) (acquired), unspecified foot: Secondary | ICD-10-CM | POA: Diagnosis not present

## 2024-06-21 DIAGNOSIS — L600 Ingrowing nail: Secondary | ICD-10-CM | POA: Diagnosis not present

## 2024-06-21 DIAGNOSIS — I83893 Varicose veins of bilateral lower extremities with other complications: Secondary | ICD-10-CM | POA: Diagnosis not present

## 2024-06-21 DIAGNOSIS — M79671 Pain in right foot: Secondary | ICD-10-CM | POA: Diagnosis not present

## 2024-06-21 DIAGNOSIS — B351 Tinea unguium: Secondary | ICD-10-CM | POA: Diagnosis not present

## 2024-06-21 DIAGNOSIS — M21629 Bunionette of unspecified foot: Secondary | ICD-10-CM | POA: Diagnosis not present

## 2024-06-21 DIAGNOSIS — Q667 Congenital pes cavus, unspecified foot: Secondary | ICD-10-CM | POA: Diagnosis not present

## 2024-06-21 DIAGNOSIS — L84 Corns and callosities: Secondary | ICD-10-CM | POA: Diagnosis not present

## 2024-06-21 DIAGNOSIS — M2010 Hallux valgus (acquired), unspecified foot: Secondary | ICD-10-CM | POA: Diagnosis not present

## 2024-06-21 DIAGNOSIS — L608 Other nail disorders: Secondary | ICD-10-CM | POA: Diagnosis not present

## 2024-06-21 DIAGNOSIS — M79672 Pain in left foot: Secondary | ICD-10-CM | POA: Diagnosis not present

## 2024-06-26 ENCOUNTER — Other Ambulatory Visit: Payer: Self-pay | Admitting: Internal Medicine

## 2024-06-26 DIAGNOSIS — I110 Hypertensive heart disease with heart failure: Secondary | ICD-10-CM | POA: Diagnosis not present

## 2024-06-26 DIAGNOSIS — N390 Urinary tract infection, site not specified: Secondary | ICD-10-CM | POA: Diagnosis not present

## 2024-06-26 DIAGNOSIS — G9341 Metabolic encephalopathy: Secondary | ICD-10-CM | POA: Diagnosis not present

## 2024-06-26 DIAGNOSIS — I5042 Chronic combined systolic (congestive) and diastolic (congestive) heart failure: Secondary | ICD-10-CM | POA: Diagnosis not present

## 2024-06-26 DIAGNOSIS — M25562 Pain in left knee: Secondary | ICD-10-CM | POA: Diagnosis not present

## 2024-06-26 DIAGNOSIS — M810 Age-related osteoporosis without current pathological fracture: Secondary | ICD-10-CM | POA: Diagnosis not present

## 2024-07-30 ENCOUNTER — Other Ambulatory Visit: Payer: Self-pay | Admitting: Internal Medicine
